# Patient Record
Sex: Male | Born: 1945 | Race: White | Hispanic: No | Marital: Single | Smoking: Former smoker
Health system: Southern US, Community
[De-identification: ages and names within clinical notes are randomized; demographics above are authoritative.]

## PROBLEM LIST (undated history)

## (undated) DIAGNOSIS — IMO0002 Reserved for concepts with insufficient information to code with codable children: Secondary | ICD-10-CM

## (undated) DIAGNOSIS — I10 Essential (primary) hypertension: Secondary | ICD-10-CM

## (undated) DIAGNOSIS — E785 Hyperlipidemia, unspecified: Secondary | ICD-10-CM

## (undated) DIAGNOSIS — R011 Cardiac murmur, unspecified: Secondary | ICD-10-CM

## (undated) DIAGNOSIS — I779 Disorder of arteries and arterioles, unspecified: Secondary | ICD-10-CM

## (undated) DIAGNOSIS — I639 Cerebral infarction, unspecified: Secondary | ICD-10-CM

## (undated) DIAGNOSIS — Z720 Tobacco use: Secondary | ICD-10-CM

## (undated) DIAGNOSIS — M1990 Unspecified osteoarthritis, unspecified site: Secondary | ICD-10-CM

## (undated) DIAGNOSIS — D369 Benign neoplasm, unspecified site: Secondary | ICD-10-CM

## (undated) DIAGNOSIS — E119 Type 2 diabetes mellitus without complications: Secondary | ICD-10-CM

## (undated) DIAGNOSIS — I739 Peripheral vascular disease, unspecified: Secondary | ICD-10-CM

## (undated) DIAGNOSIS — G629 Polyneuropathy, unspecified: Secondary | ICD-10-CM

## (undated) DIAGNOSIS — E559 Vitamin D deficiency, unspecified: Secondary | ICD-10-CM

## (undated) DIAGNOSIS — J189 Pneumonia, unspecified organism: Secondary | ICD-10-CM

## (undated) HISTORY — DX: Hyperlipidemia, unspecified: E78.5

## (undated) HISTORY — DX: Tobacco use: Z72.0

## (undated) HISTORY — DX: Vitamin D deficiency, unspecified: E55.9

## (undated) HISTORY — DX: Benign neoplasm, unspecified site: D36.9

## (undated) HISTORY — PX: OTHER SURGICAL HISTORY: SHX169

## (undated) HISTORY — DX: Disorder of arteries and arterioles, unspecified: I77.9

## (undated) HISTORY — DX: Unspecified osteoarthritis, unspecified site: M19.90

## (undated) HISTORY — PX: ARTHRODESIS: SHX136

## (undated) HISTORY — DX: Reserved for concepts with insufficient information to code with codable children: IMO0002

## (undated) HISTORY — DX: Cardiac murmur, unspecified: R01.1

## (undated) HISTORY — DX: Type 2 diabetes mellitus without complications: E11.9

## (undated) HISTORY — DX: Essential (primary) hypertension: I10

## (undated) HISTORY — DX: Polyneuropathy, unspecified: G62.9

## (undated) HISTORY — DX: Peripheral vascular disease, unspecified: I73.9

## (undated) HISTORY — DX: Cerebral infarction, unspecified: I63.9

## (undated) HISTORY — DX: Pneumonia, unspecified organism: J18.9

## (undated) HISTORY — PX: BACK SURGERY: SHX140

---

## 1999-09-27 ENCOUNTER — Encounter: Payer: Self-pay | Admitting: Neurosurgery

## 1999-09-27 ENCOUNTER — Inpatient Hospital Stay (HOSPITAL_COMMUNITY): Admission: RE | Admit: 1999-09-27 | Discharge: 1999-09-27 | Payer: Self-pay | Admitting: Neurosurgery

## 2003-03-27 ENCOUNTER — Inpatient Hospital Stay (HOSPITAL_COMMUNITY): Admission: EM | Admit: 2003-03-27 | Discharge: 2003-04-04 | Payer: Self-pay | Admitting: Emergency Medicine

## 2003-03-27 ENCOUNTER — Encounter: Payer: Self-pay | Admitting: Emergency Medicine

## 2003-03-28 ENCOUNTER — Encounter: Payer: Self-pay | Admitting: Neurosurgery

## 2003-04-04 ENCOUNTER — Inpatient Hospital Stay (HOSPITAL_COMMUNITY)
Admission: RE | Admit: 2003-04-04 | Discharge: 2003-04-18 | Payer: Self-pay | Admitting: Physical Medicine & Rehabilitation

## 2003-05-26 ENCOUNTER — Encounter
Admission: RE | Admit: 2003-05-26 | Discharge: 2003-08-24 | Payer: Self-pay | Admitting: Physical Medicine & Rehabilitation

## 2003-06-20 ENCOUNTER — Emergency Department (HOSPITAL_COMMUNITY): Admission: EM | Admit: 2003-06-20 | Discharge: 2003-06-20 | Payer: Self-pay | Admitting: Emergency Medicine

## 2003-06-20 IMAGING — CT CT HEAD W/O CM
1 series · 16 of 30 positions shown, 20 images · non-contrast
Comparison: NONE.

FINDINGS
CLINICAL DATA: DIZZINESS.
HEAD CT WITHOUT CONTRAST, [DATE]

[Series 2: head · axial · 0.47mm/px · z∈[+139,+278]mm · 16 of 32 slices shown, 20 images]
[im 2/32  brain]
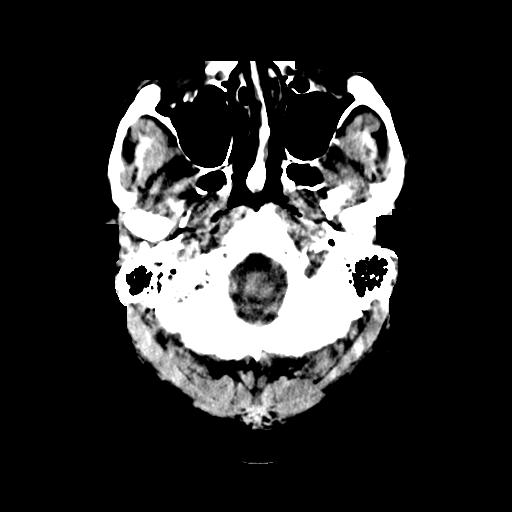
[im 2/32  bone]
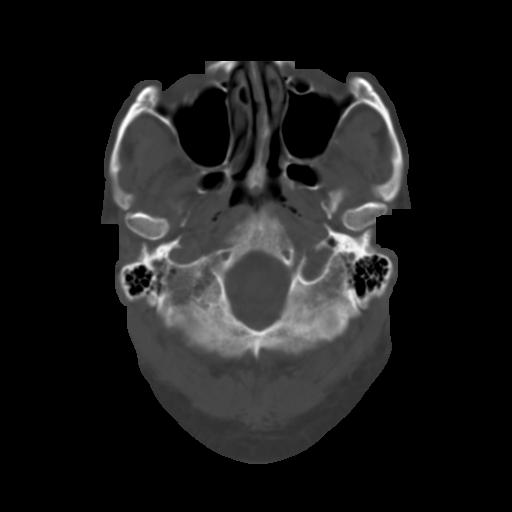
[im 4/32  brain]
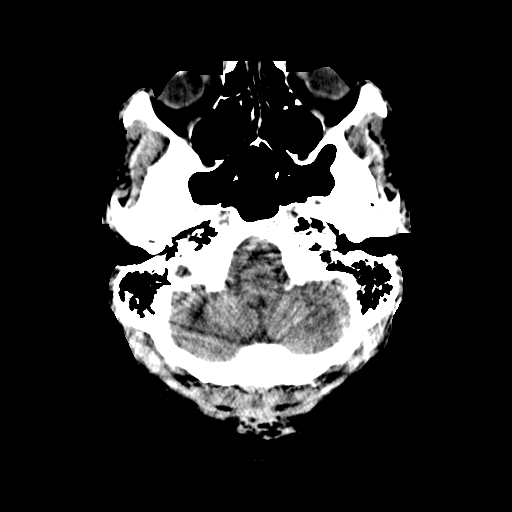
[im 6/32  brain]
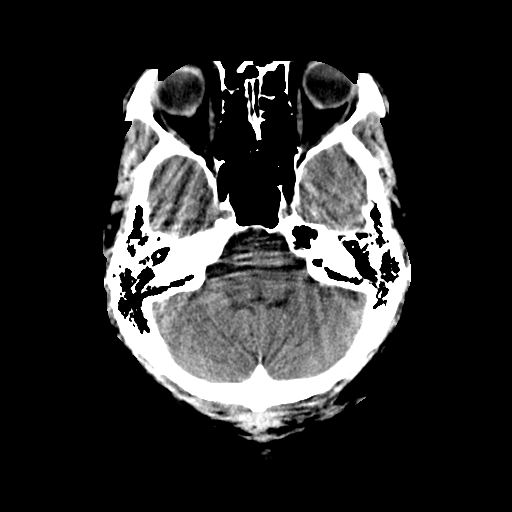
[im 8/32  brain]
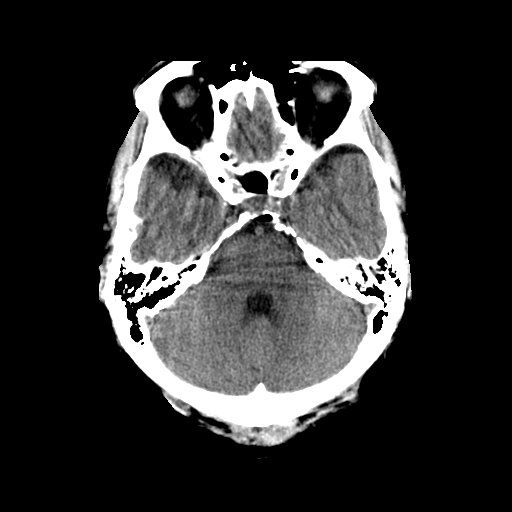
[im 9/32  brain]
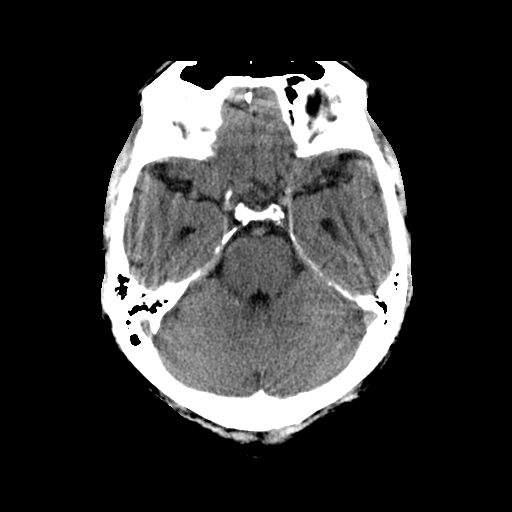
[im 9/32  bone]
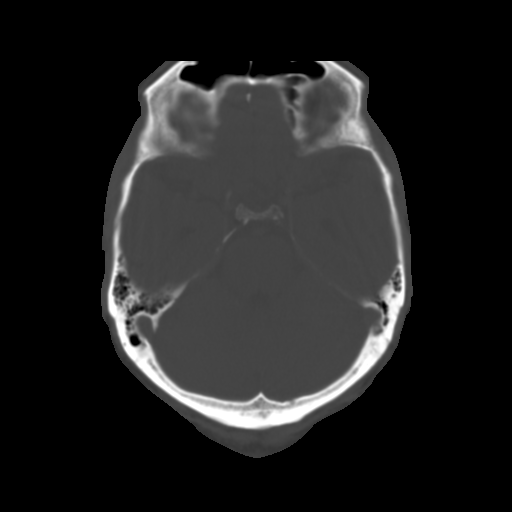
[im 11/32  brain]
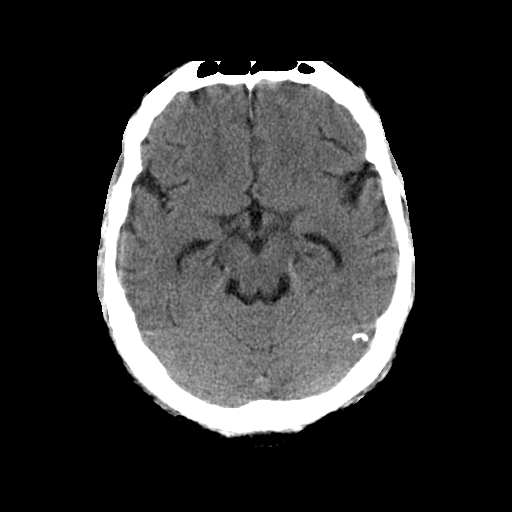
[im 13/32  brain]
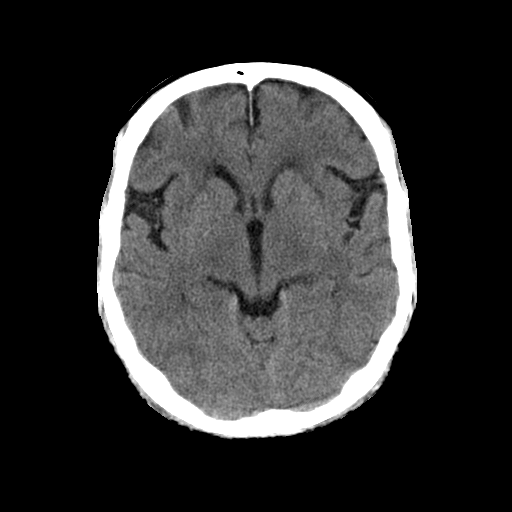
[im 15/32  brain]
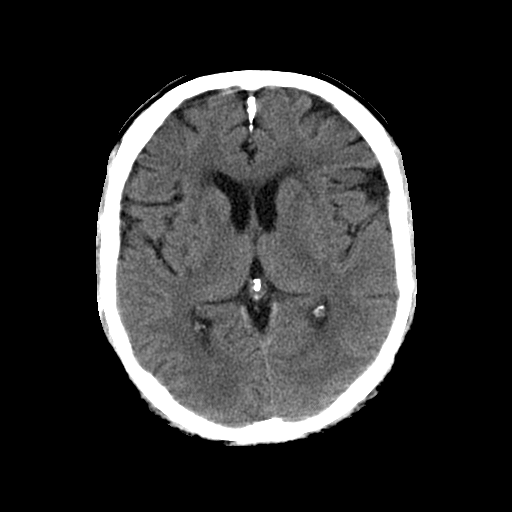
[im 17/32  brain]
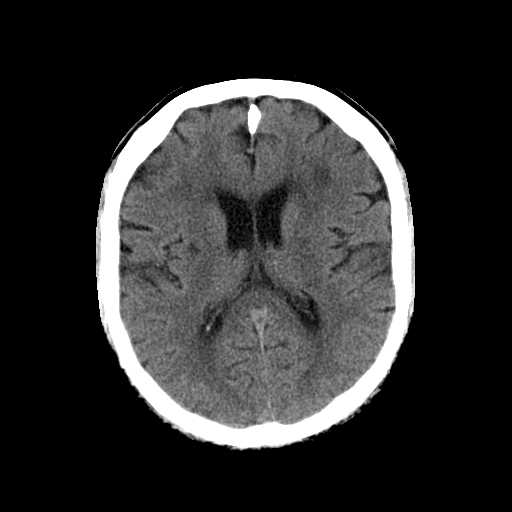
[im 17/32  bone]
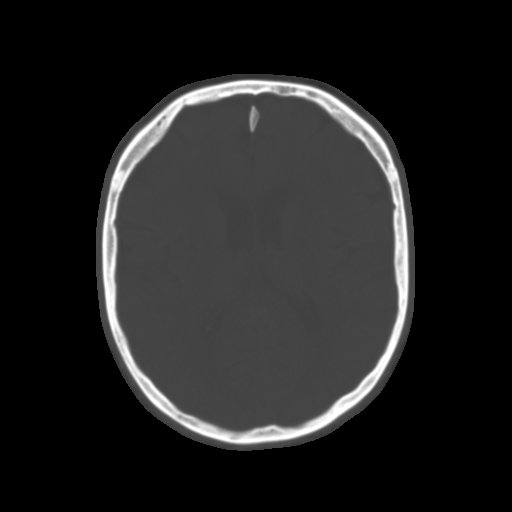
[im 19/32  brain]
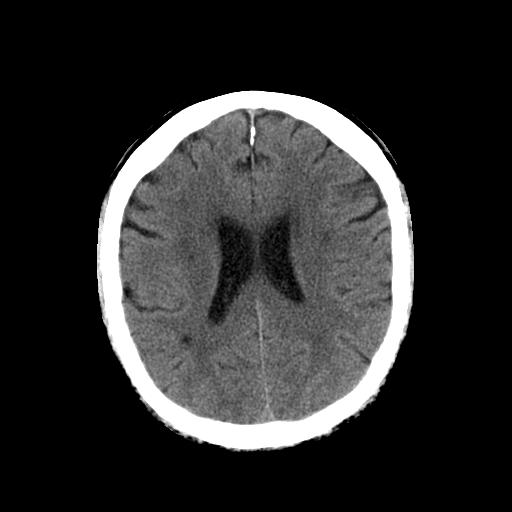
[im 21/32  brain]
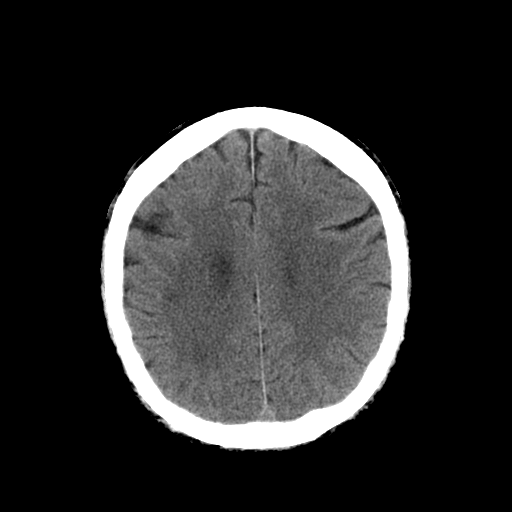
[im 23/32  brain]
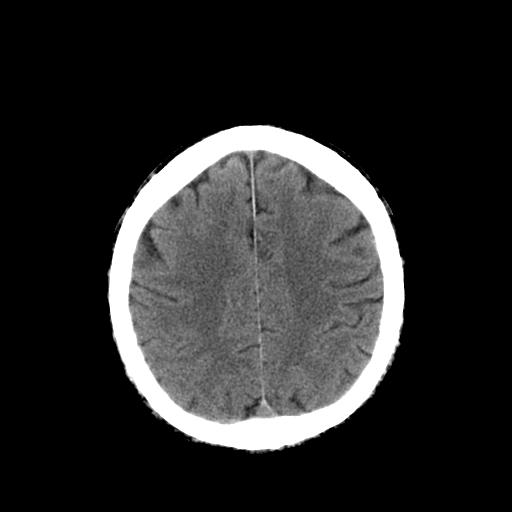
[im 24/32  brain]
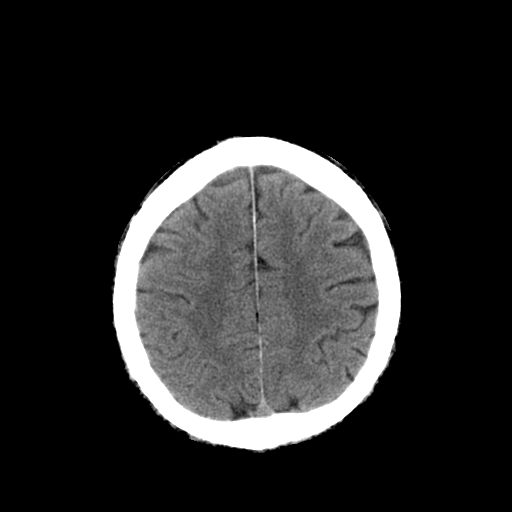
[im 24/32  bone]
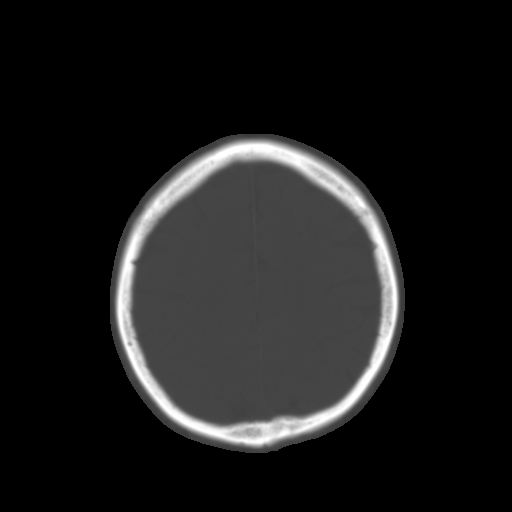
[im 26/32  brain]
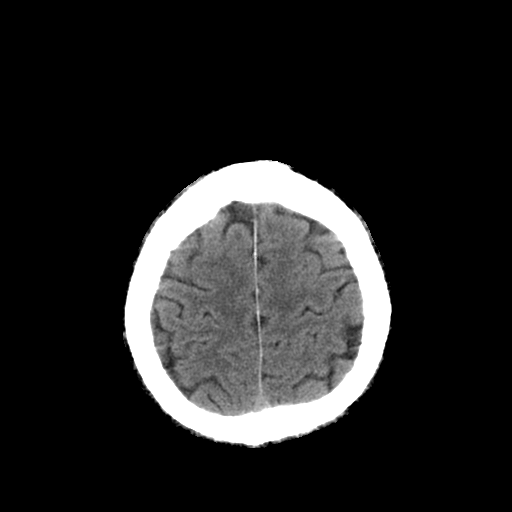
[im 28/32  brain]
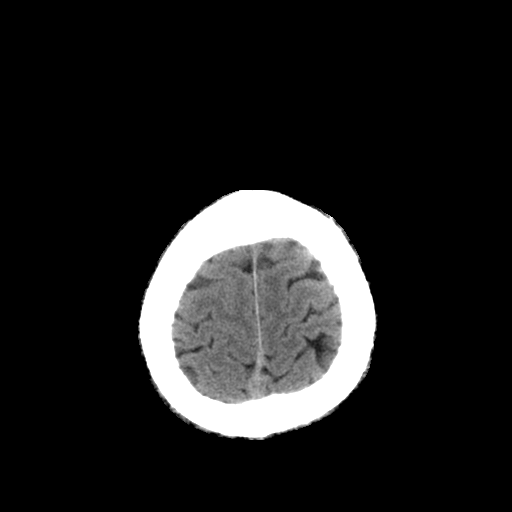
[im 30/32  brain]
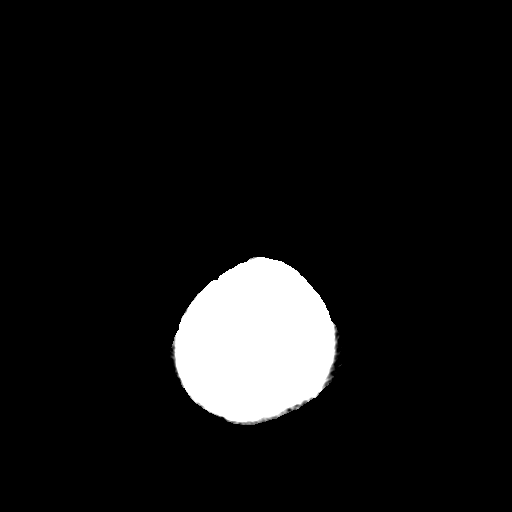

[16 of 30 positions shown; findings below may reference images not displayed]

FINDINGS: ROUTINE UNINFUSED CT SCANNING OF THE HEAD SHOWS NO EVIDENCE FOR ACUTE HEMORRHAGE,
HYDROCEPHALUS, MASS EFFECT, OR ABNORMAL EXTRA-AXIAL FLUID COLLECTION.  PATCHY AREAS OF LOW
ATTENUATION ARE SEEN SCATTERED THROUGHOUT THE PERIVENTRICULAR DEEP WHITE MATTER.  NO DEFINITE CT
EVIDENCE FOR ACUTE ISCHEMIA.
BONE WINDOWS SHOW THE VISUALIZED PORTIONS OF THE PARANASAL SINUSES TO BE CLEAR.
IMPRESSION
1. NO CT EVIDENCE OF ACUTE INTRACRANIAL ABNORMALITY.
2.  PATCHY AREAS OF LOW ATTENUATION ARE SEEN IN THE DEEP PERIVENTRICULAR AND SUBCORTICAL WHITE
MATTER.  ALTHOUGH NONSPECIFIC THE CHANGES ARE OFTEN RELATED TO CHRONIC SMALL VESSEL DISEASE.
FEATURES ON TODAY'S STUDY ARE SLIGHTLY MORE PATCHY THAN TYPICAL AND MRI MIGHT PROVE HELPFUL TO
FURTHER CHARACTERIZE.

## 2003-06-27 ENCOUNTER — Encounter: Admission: RE | Admit: 2003-06-27 | Discharge: 2003-06-27 | Payer: Self-pay | Admitting: Neurosurgery

## 2003-09-18 ENCOUNTER — Encounter
Admission: RE | Admit: 2003-09-18 | Discharge: 2003-12-17 | Payer: Self-pay | Admitting: Physical Medicine & Rehabilitation

## 2003-09-20 ENCOUNTER — Encounter: Admission: RE | Admit: 2003-09-20 | Discharge: 2003-09-20 | Payer: Self-pay | Admitting: Neurosurgery

## 2003-10-31 ENCOUNTER — Ambulatory Visit (HOSPITAL_COMMUNITY)
Admission: RE | Admit: 2003-10-31 | Discharge: 2003-10-31 | Payer: Self-pay | Admitting: Physical Medicine & Rehabilitation
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
## 2004-02-06 ENCOUNTER — Encounter
Admission: RE | Admit: 2004-02-06 | Discharge: 2004-05-06 | Payer: Self-pay | Admitting: Physical Medicine & Rehabilitation

## 2004-05-29 ENCOUNTER — Encounter
Admission: RE | Admit: 2004-05-29 | Discharge: 2004-08-27 | Payer: Self-pay | Admitting: Physical Medicine & Rehabilitation

## 2004-05-31 ENCOUNTER — Ambulatory Visit: Payer: Self-pay | Admitting: Physical Medicine & Rehabilitation

## 2004-08-28 ENCOUNTER — Encounter
Admission: RE | Admit: 2004-08-28 | Discharge: 2004-11-26 | Payer: Self-pay | Admitting: Physical Medicine & Rehabilitation

## 2004-11-29 ENCOUNTER — Encounter
Admission: RE | Admit: 2004-11-29 | Discharge: 2005-02-27 | Payer: Self-pay | Admitting: Physical Medicine & Rehabilitation

## 2004-12-03 ENCOUNTER — Ambulatory Visit: Payer: Self-pay | Admitting: Physical Medicine & Rehabilitation

## 2005-01-14 ENCOUNTER — Ambulatory Visit: Payer: Self-pay | Admitting: Physical Medicine & Rehabilitation

## 2005-03-07 ENCOUNTER — Encounter
Admission: RE | Admit: 2005-03-07 | Discharge: 2005-06-05 | Payer: Self-pay | Admitting: Physical Medicine & Rehabilitation

## 2005-03-07 ENCOUNTER — Ambulatory Visit: Payer: Self-pay | Admitting: Physical Medicine & Rehabilitation

## 2005-06-09 ENCOUNTER — Encounter
Admission: RE | Admit: 2005-06-09 | Discharge: 2005-09-07 | Payer: Self-pay | Admitting: Physical Medicine & Rehabilitation

## 2005-06-09 ENCOUNTER — Ambulatory Visit: Payer: Self-pay | Admitting: Physical Medicine & Rehabilitation

## 2005-08-29 ENCOUNTER — Ambulatory Visit: Payer: Self-pay | Admitting: Physical Medicine & Rehabilitation

## 2006-02-04 ENCOUNTER — Ambulatory Visit: Payer: Self-pay | Admitting: Family Medicine

## 2006-03-01 ENCOUNTER — Ambulatory Visit: Payer: Self-pay | Admitting: Family Medicine

## 2006-04-01 ENCOUNTER — Ambulatory Visit: Payer: Self-pay | Admitting: Family Medicine

## 2006-06-18 ENCOUNTER — Ambulatory Visit: Payer: Self-pay | Admitting: Gastroenterology

## 2009-07-26 ENCOUNTER — Emergency Department: Payer: Self-pay | Admitting: Emergency Medicine
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
## 2011-09-15 ENCOUNTER — Ambulatory Visit: Payer: Self-pay | Admitting: Gastroenterology

## 2011-09-17 LAB — PATHOLOGY REPORT

## 2012-04-06 LAB — COMPREHENSIVE METABOLIC PANEL
BUN: 11 mg/dL (ref 7–18)
Bilirubin,Total: 0.5 mg/dL (ref 0.2–1.0)
Chloride: 108 mmol/L — ABNORMAL HIGH (ref 98–107)
Creatinine: 0.87 mg/dL (ref 0.60–1.30)
EGFR (African American): >60
Potassium: 3.7 mmol/L (ref 3.5–5.1)
Sodium: 143 mmol/L (ref 136–145)
Total Protein: 6.7 g/dL (ref 6.4–8.2)
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
## 2012-04-06 IMAGING — CT CT LUMBAR SPINE WITHOUT CONTRAST
1 series · 12 of 14 positions shown, 15 images · non-contrast
Comparison: none

REASON FOR EXAM: evaluate for acute lumbar fracture
COMMENTS:

[Series 2: axials · axial · 0.40mm/px · z∈[-461,-263]mm · 12 of 80 slices shown, 15 images]
[im 7/80  soft-tissue]
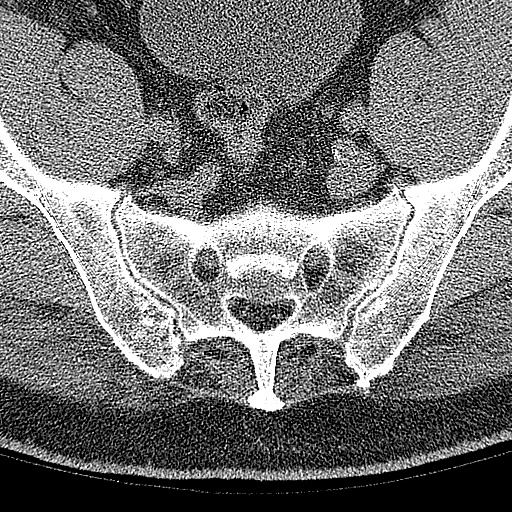
[im 7/80  bone]
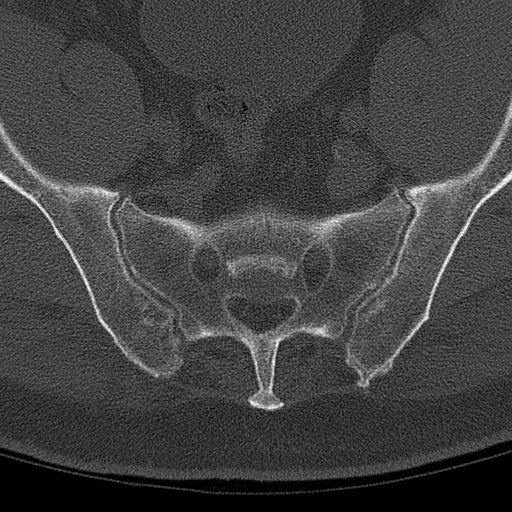
[im 13/80  bone]
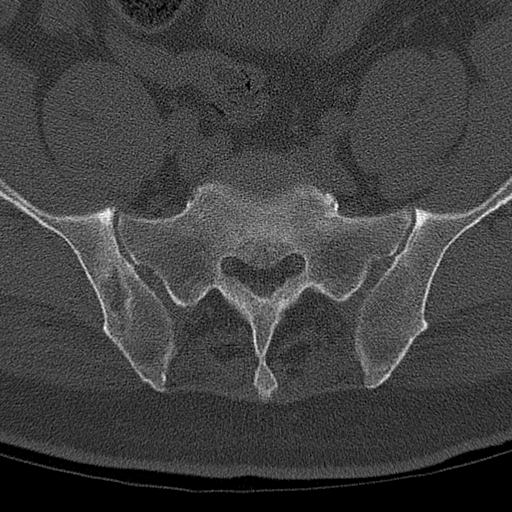
[im 19/80  bone]
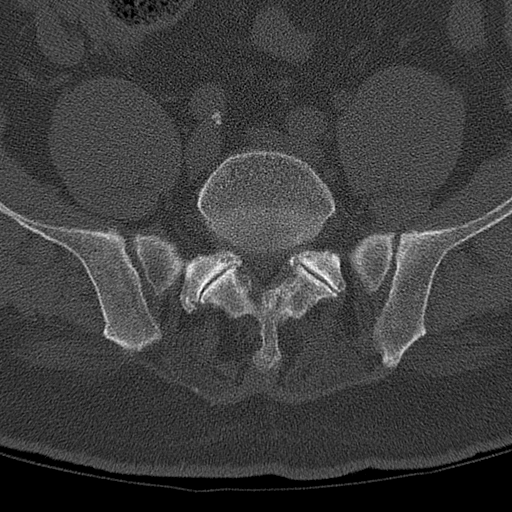
[im 25/80  bone]
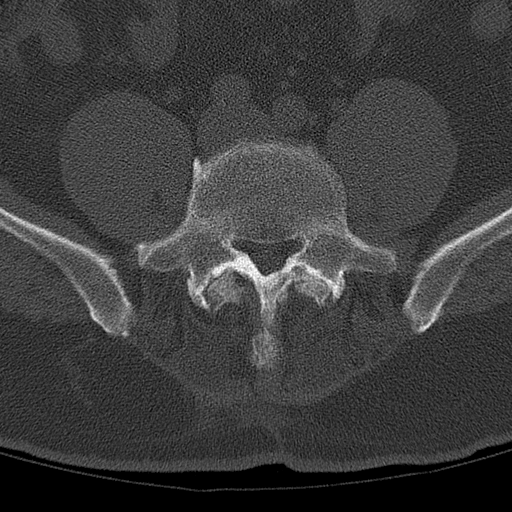
[im 31/80  soft-tissue]
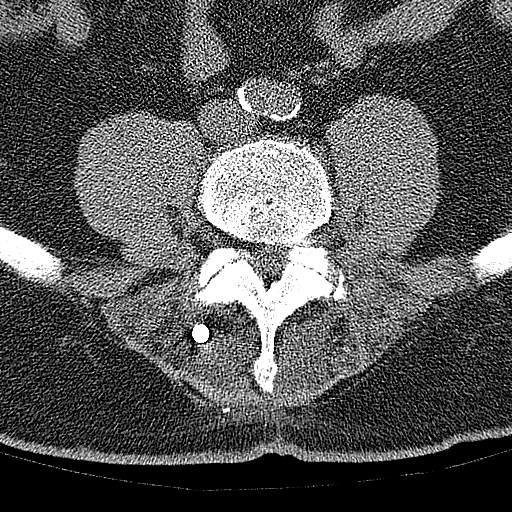
[im 31/80  bone]
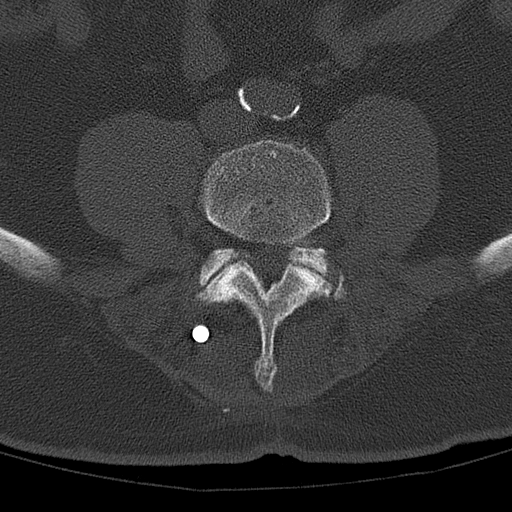
[im 37/80  bone]
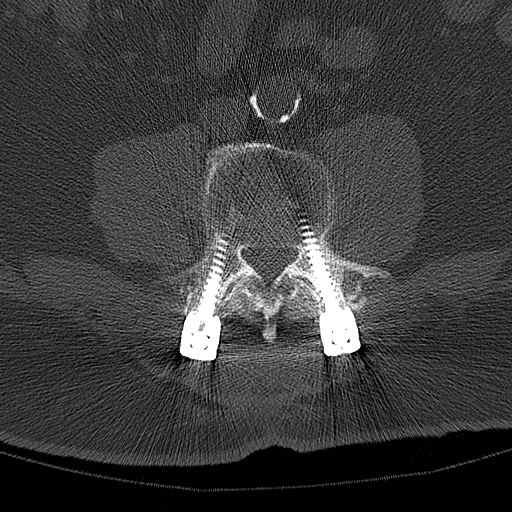
[im 43/80  bone]
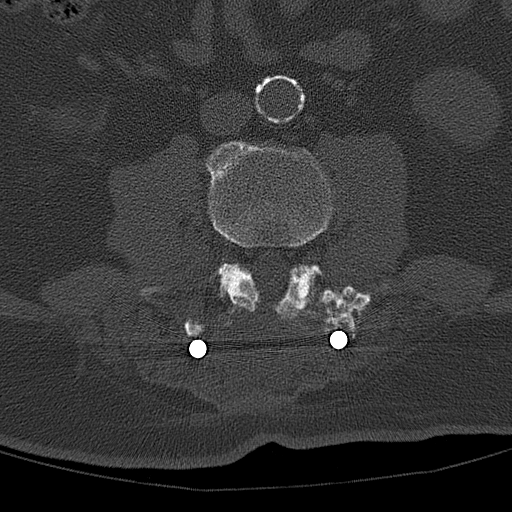
[im 49/80  bone]
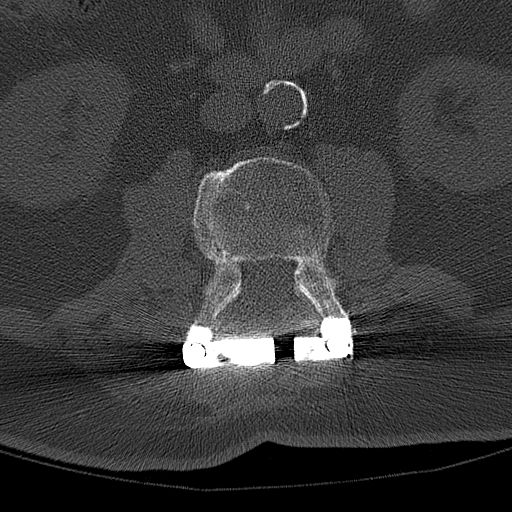
[im 55/80  soft-tissue]
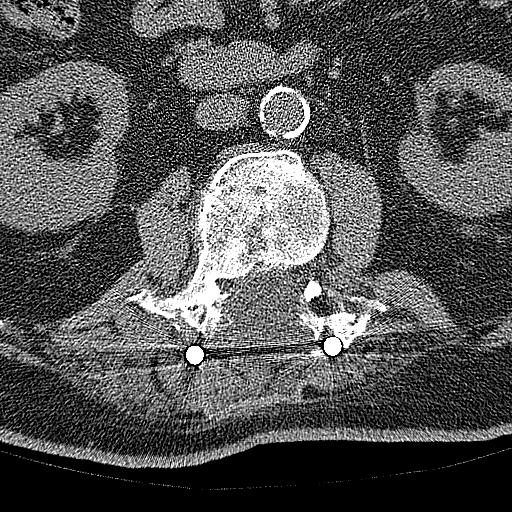
[im 55/80  bone]
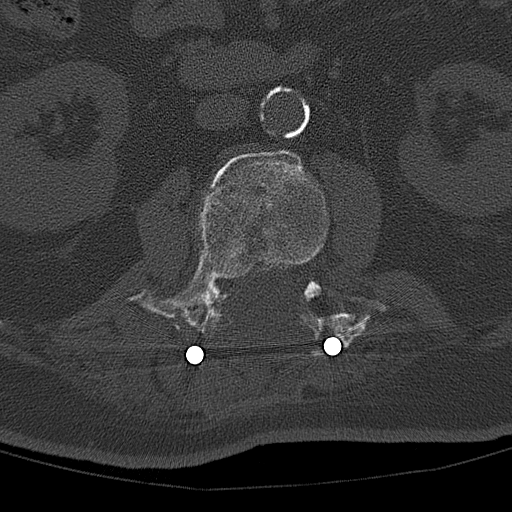
[im 61/80  bone]
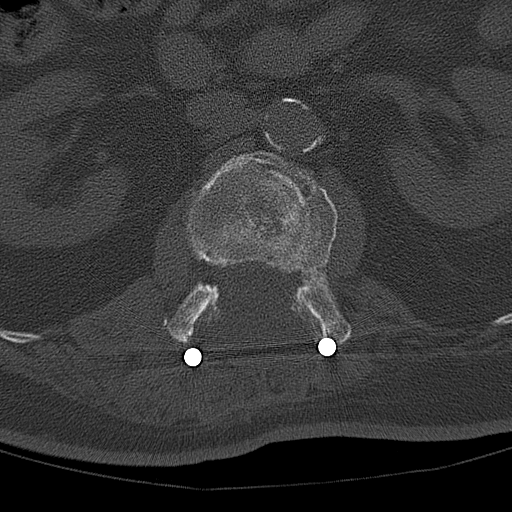
[im 67/80  bone]
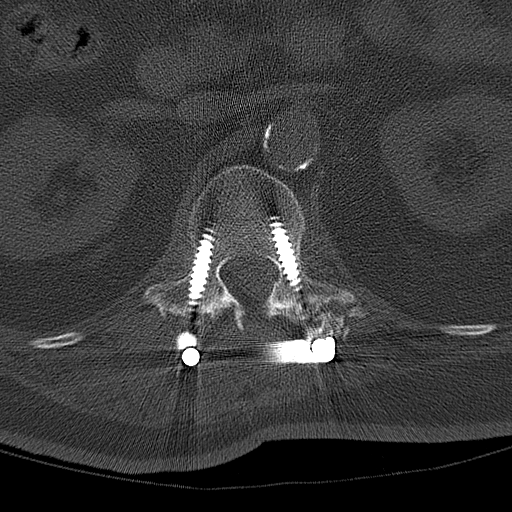
[im 73/80  bone]
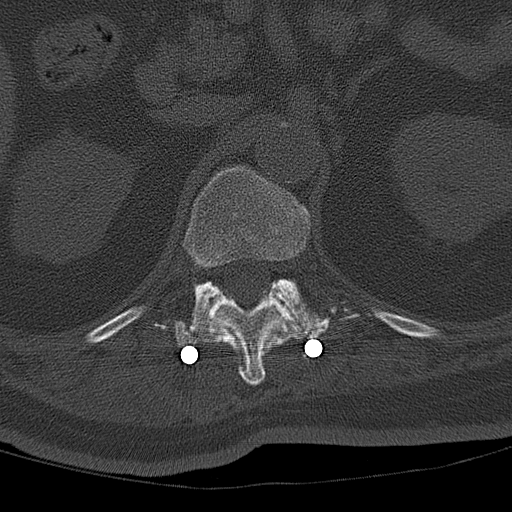

[12 of 14 positions shown; findings below may reference images not displayed]

PROCEDURE:     CT  - CT LUMBAR SPINE WO  - [DATE]  [DATE]

RESULT:     Multislice helical acquisition through the lumbar spine is
reconstructed at bone window settings in the axial, coronal and sagittal
planes at 3.0 mm slice thickness. Pedicle screws are seen bilaterally at L4,
L3, L2 and L1 with stabilization rods bilaterally. There is loss of height
and evidence of acute fracture involving the L2 vertebral body with some
posterior displacement. There is some prominent spurring between the
anterior aspect of L1 and L2. There are no previous radiographs or CT is a
lumbar spine for comparison. There has been previous laminectomy at this
level and therefore likely compromise the spinal canal is not evident.
IMPRESSION: Compression fracture of L2 possibly with some acute on
chronic change. Correlate with previous outside images if they exist.
Postsurgical changes present as described. No other levels of abnormality
are evident. Laminectomy changes are noted as discussed above. This raises
the possibility that some of the changes visualized could be chronic.

[REDACTED]
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
## 2012-04-07 ENCOUNTER — Observation Stay: Payer: Self-pay | Admitting: Family Medicine

## 2012-04-07 LAB — TROPONIN I
Troponin-I: 0.02 ng/mL
Troponin-I: <0.02 ng/mL

## 2012-04-07 LAB — CBC WITH DIFFERENTIAL/PLATELET
Basophil #: 0.1 10*3/uL (ref 0.0–0.1)
Eosinophil #: 0.1 10*3/uL (ref 0.0–0.7)
Lymphocyte #: 2.5 10*3/uL (ref 1.0–3.6)
MCH: 29 pg (ref 26.0–34.0)
MCHC: 34.4 g/dL (ref 32.0–36.0)
MCV: 84 fL (ref 80–100)
Monocyte #: 0.5 x10 3/mm (ref 0.2–1.0)
Monocyte %: 6.1 %
Platelet: 188 10*3/uL (ref 150–440)
RBC: 5.25 10*6/uL (ref 4.40–5.90)
RDW: 13.4 % (ref 11.5–14.5)
WBC: 8.9 10*3/uL (ref 3.8–10.6)

## 2012-04-08 LAB — BASIC METABOLIC PANEL
Anion Gap: 9 (ref 7–16)
BUN: 15 mg/dL (ref 7–18)
EGFR (African American): >60
Glucose: 237 mg/dL — ABNORMAL HIGH (ref 65–99)
Osmolality: 282 (ref 275–301)

## 2012-06-01 DIAGNOSIS — I639 Cerebral infarction, unspecified: Secondary | ICD-10-CM

## 2012-06-01 HISTORY — DX: Cerebral infarction, unspecified: I63.9

## 2012-06-28 ENCOUNTER — Emergency Department: Payer: Self-pay | Admitting: Emergency Medicine

## 2012-06-28 LAB — COMPREHENSIVE METABOLIC PANEL
Albumin: 3.5 g/dL (ref 3.4–5.0)
Anion Gap: 10 (ref 7–16)
BUN: 10 mg/dL (ref 7–18)
Co2: 26 mmol/L (ref 21–32)
Creatinine: 1.01 mg/dL (ref 0.60–1.30)
EGFR (African American): >60
Osmolality: 291 (ref 275–301)
SGPT (ALT): 19 U/L (ref 12–78)
Sodium: 144 mmol/L (ref 136–145)

## 2012-06-28 LAB — CBC
HCT: 41.4 % (ref 40.0–52.0)
HGB: 14.7 g/dL (ref 13.0–18.0)
MCH: 29.9 pg (ref 26.0–34.0)
MCHC: 35.6 g/dL (ref 32.0–36.0)
RDW: 14.1 % (ref 11.5–14.5)

## 2012-06-28 LAB — URINALYSIS, COMPLETE
Bilirubin,UR: NEGATIVE
Blood: NEGATIVE
Ketone: NEGATIVE
Ph: 6 (ref 4.5–8.0)
RBC,UR: NONE SEEN /HPF (ref 0–5)
Squamous Epithelial: NONE SEEN
WBC UR: <1 /HPF (ref 0–5)

## 2012-06-28 IMAGING — CT CT HEAD WITHOUT CONTRAST
2 series · 16 of 30 positions shown, 20 images · non-contrast
Comparison: none

REASON FOR EXAM: slurred speech/unsteady gait
COMMENTS:

PROCEDURE:     CT  - CT HEAD WITHOUT CONTRAST  - [DATE] [DATE]
RESULT:     CT dated [DATE].
TECHNIQUE: Helical noncontrasted 5 mm sections were obtained from the skull
base to the vertex.

[Series 2: without · axial · non-contrast · 0.44mm/px · z∈[-122,+13]mm · 13 of 33 slices shown, 17 images]
[im 3/33  brain]
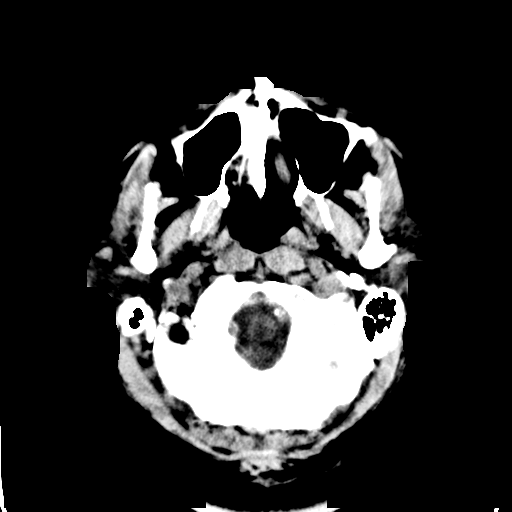
[im 3/33  bone]
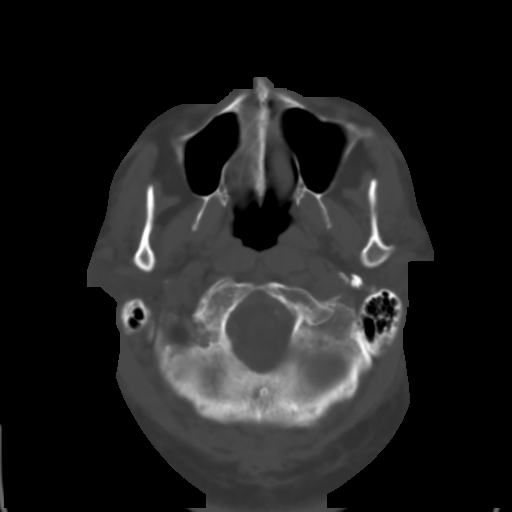
[im 5/33  brain]
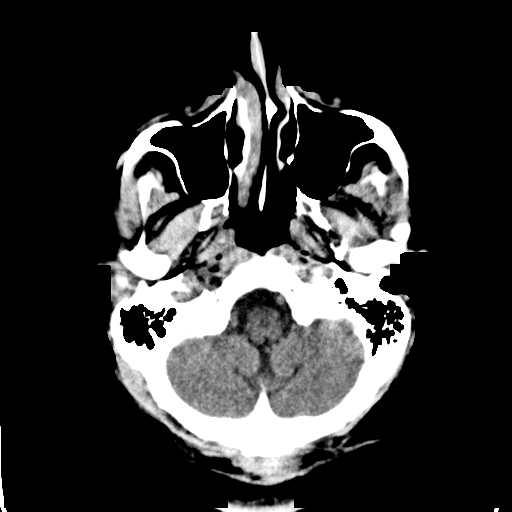
[im 7/33  brain]
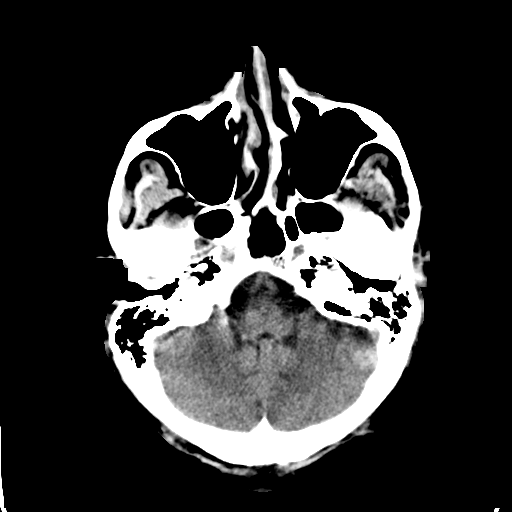
[im 10/33  brain]
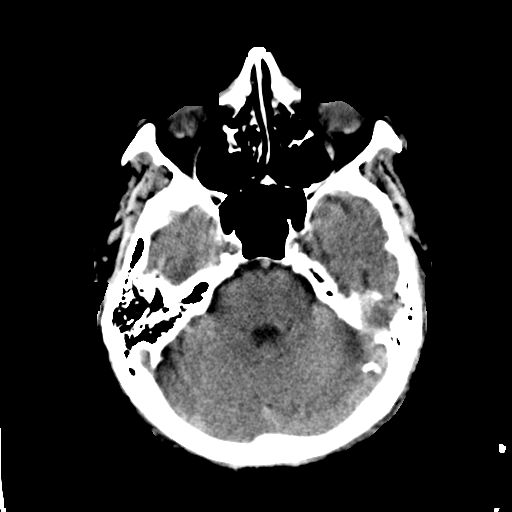
[im 12/33  brain]
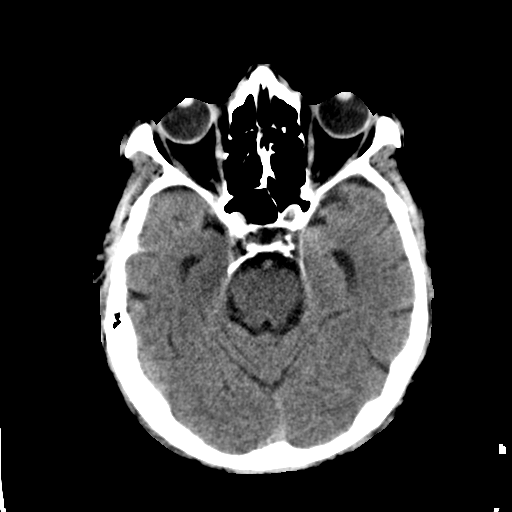
[im 12/33  bone]
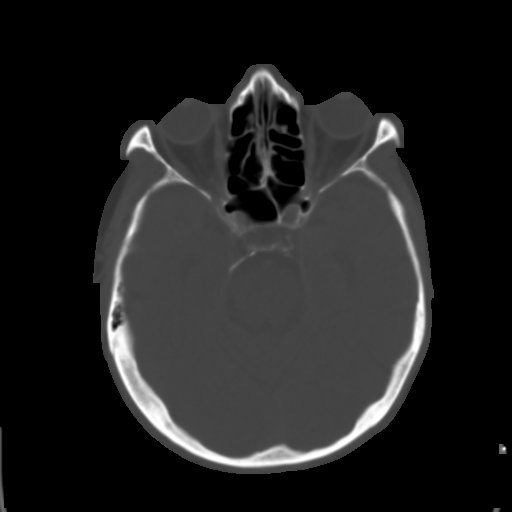
[im 14/33  brain]
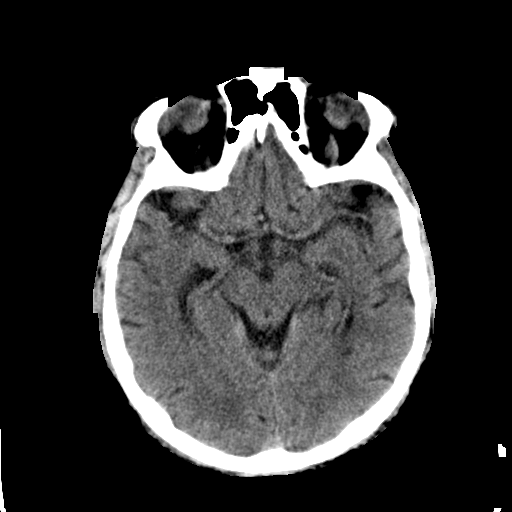
[im 17/33  brain]
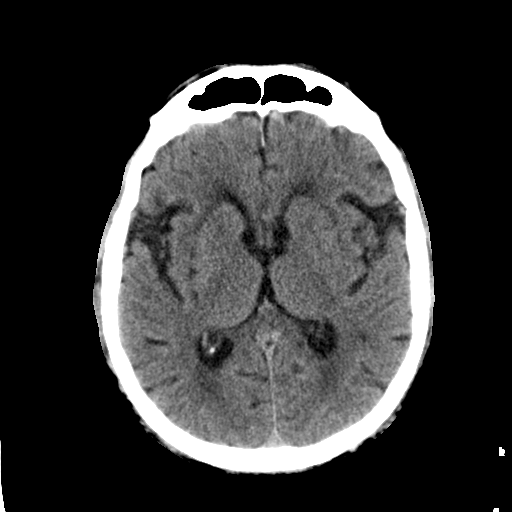
[im 19/33  brain]
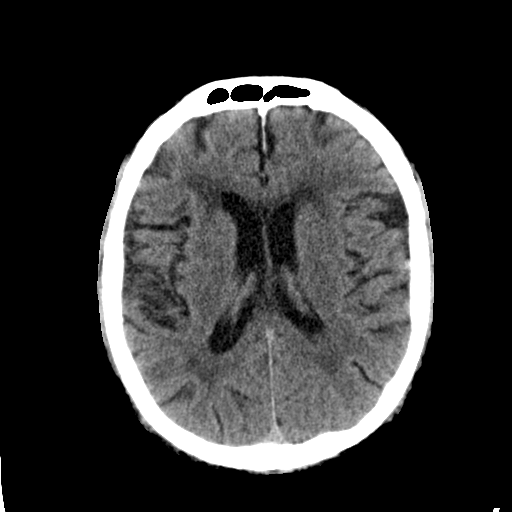
[im 21/33  brain]
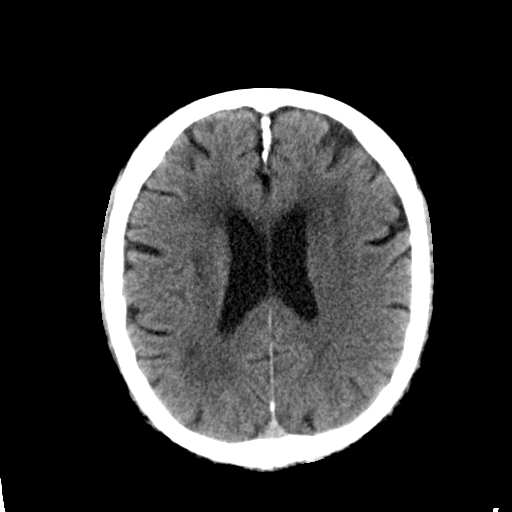
[im 21/33  bone]
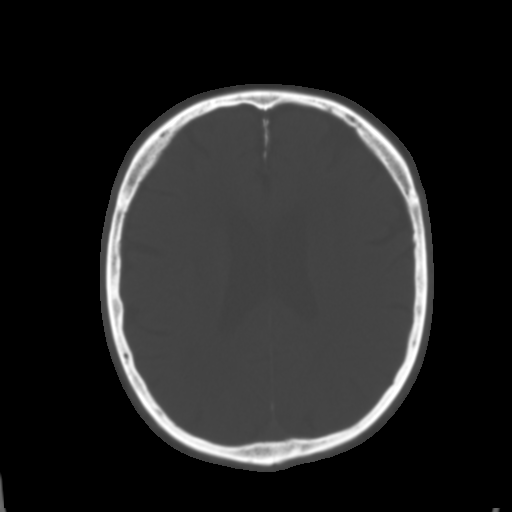
[im 23/33  brain]
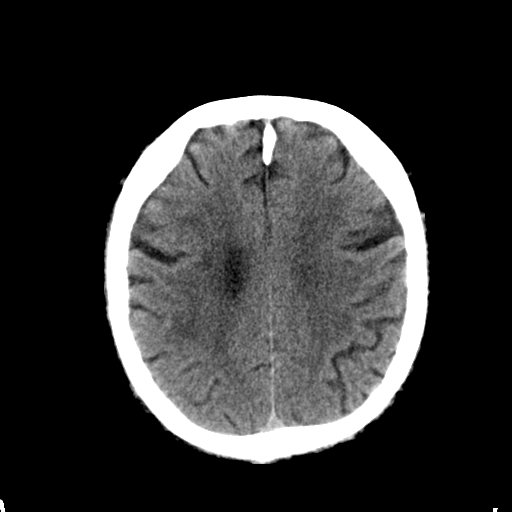
[im 26/33  brain]
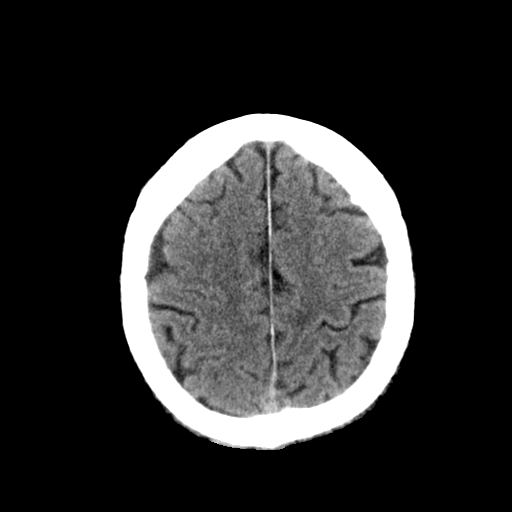
[im 28/33  brain]
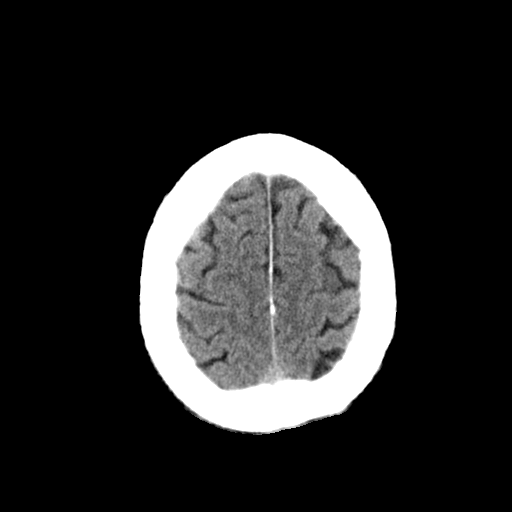
[im 30/33  brain]
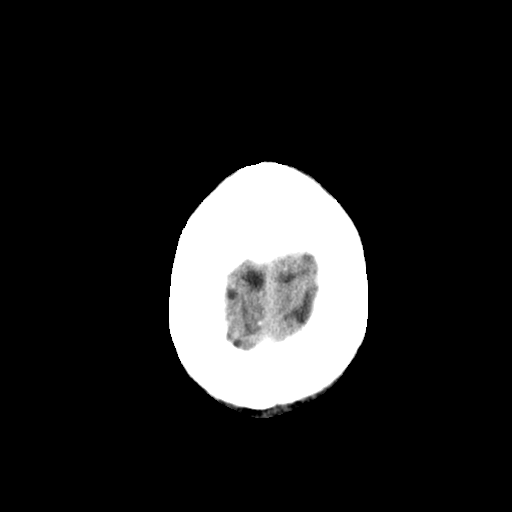
[im 30/33  bone]
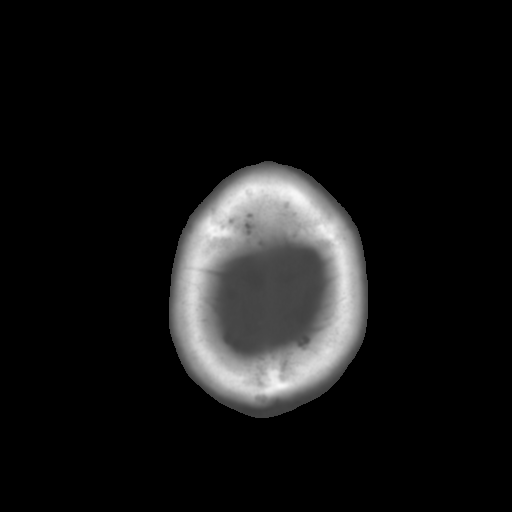

[Series 3: bone · axial · 0.44mm/px · z∈[-122,-77]mm · 3 of 33 slices shown]
[im 3/33  bone]
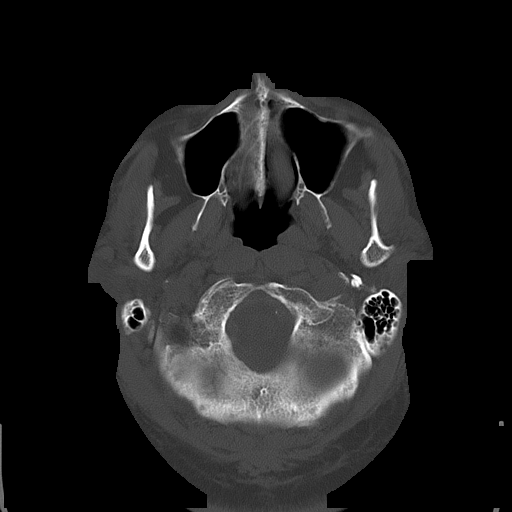
[im 7/33  bone]
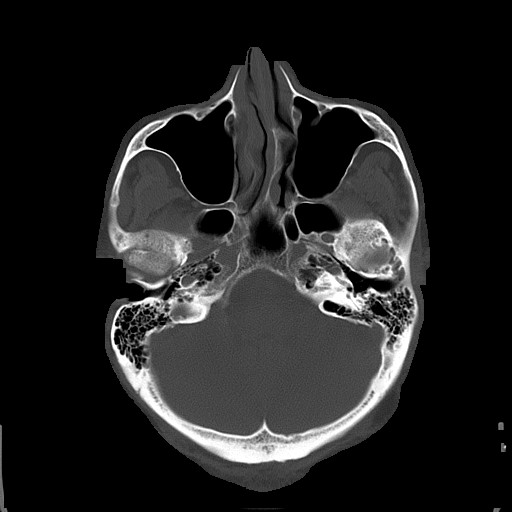
[im 12/33  bone]
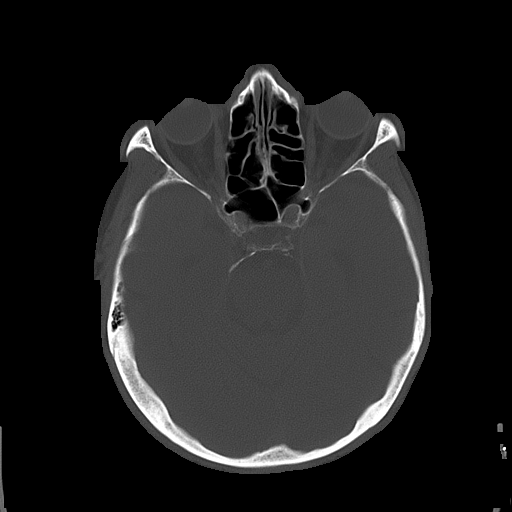

[16 of 30 positions shown; findings below may reference images not displayed]

FINDINGS: There is no evidence of intra-axial nor extra-axial fluid
collections nor evidence of acute hemorrhage. There are diffuse areas of
low-attenuation within the subcortical, deep, and periventricular white
matter regions. Is no evidence of mass effect. Is no evidence of depressed
skull fracture the visualized paranasal sinuses and mastoid air cells are
patent.
IMPRESSION: Small vessel white matter ischemic changes without evidence
of focal or acute abnormalities.

## 2012-06-29 ENCOUNTER — Inpatient Hospital Stay: Payer: Self-pay | Admitting: Family Medicine

## 2012-06-29 LAB — BASIC METABOLIC PANEL
Calcium, Total: 8.8 mg/dL (ref 8.5–10.1)
EGFR (African American): >60
EGFR (Non-African Amer.): >60
Glucose: 211 mg/dL — ABNORMAL HIGH (ref 65–99)
Osmolality: 289 (ref 275–301)

## 2012-06-29 LAB — CBC
HCT: 43.5 % (ref 40.0–52.0)
HGB: 15.4 g/dL (ref 13.0–18.0)
MCH: 29.6 pg (ref 26.0–34.0)
MCHC: 35.4 g/dL (ref 32.0–36.0)

## 2012-06-29 LAB — URINALYSIS, COMPLETE
Bacteria: NONE SEEN
Blood: NEGATIVE
Glucose,UR: 50 mg/dL (ref 0–75)
Leukocyte Esterase: NEGATIVE
Nitrite: NEGATIVE
Protein: NEGATIVE
RBC,UR: <1 /HPF (ref 0–5)
Squamous Epithelial: NONE SEEN
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
## 2012-06-30 DIAGNOSIS — I6789 Other cerebrovascular disease: Secondary | ICD-10-CM

## 2012-06-30 LAB — CBC WITH DIFFERENTIAL/PLATELET
Basophil %: 0.5 %
Eosinophil #: 0.3 10*3/uL (ref 0.0–0.7)
HGB: 14.8 g/dL (ref 13.0–18.0)
Lymphocyte #: 2.9 10*3/uL (ref 1.0–3.6)
MCH: 29.5 pg (ref 26.0–34.0)
MCHC: 35.6 g/dL (ref 32.0–36.0)
Monocyte #: 0.5 x10 3/mm (ref 0.2–1.0)
Neutrophil %: 54.6 %
Platelet: 166 10*3/uL (ref 150–440)
RDW: 13.9 % (ref 11.5–14.5)

## 2012-06-30 LAB — BASIC METABOLIC PANEL
Anion Gap: 10 (ref 7–16)
Calcium, Total: 8.9 mg/dL (ref 8.5–10.1)
Co2: 28 mmol/L (ref 21–32)
Creatinine: 0.91 mg/dL (ref 0.60–1.30)
EGFR (African American): >60
EGFR (Non-African Amer.): >60

## 2012-06-30 LAB — HEMOGLOBIN A1C: Hemoglobin A1C: 7.5 % — ABNORMAL HIGH (ref 4.2–6.3)

## 2012-06-30 LAB — MAGNESIUM: Magnesium: 2 mg/dL

## 2012-06-30 LAB — LIPID PANEL
Cholesterol: 212 mg/dL — ABNORMAL HIGH (ref 0–200)
Ldl Cholesterol, Calc: 110 mg/dL — ABNORMAL HIGH (ref 0–100)
Triglycerides: 359 mg/dL — ABNORMAL HIGH (ref 0–200)
VLDL Cholesterol, Calc: 72 mg/dL — ABNORMAL HIGH (ref 5–40)

## 2012-07-06 ENCOUNTER — Ambulatory Visit: Payer: Self-pay | Admitting: Family Medicine

## 2012-07-06 IMAGING — CR DG CHEST 2V
1 series · 3 of 3 positions shown · non-contrast
Comparison: none

REASON FOR EXAM: pneumonia
COMMENTS:

[Series 1: pa · 0.17mm/px · 3 of 3 slices shown]
[im 1/3]
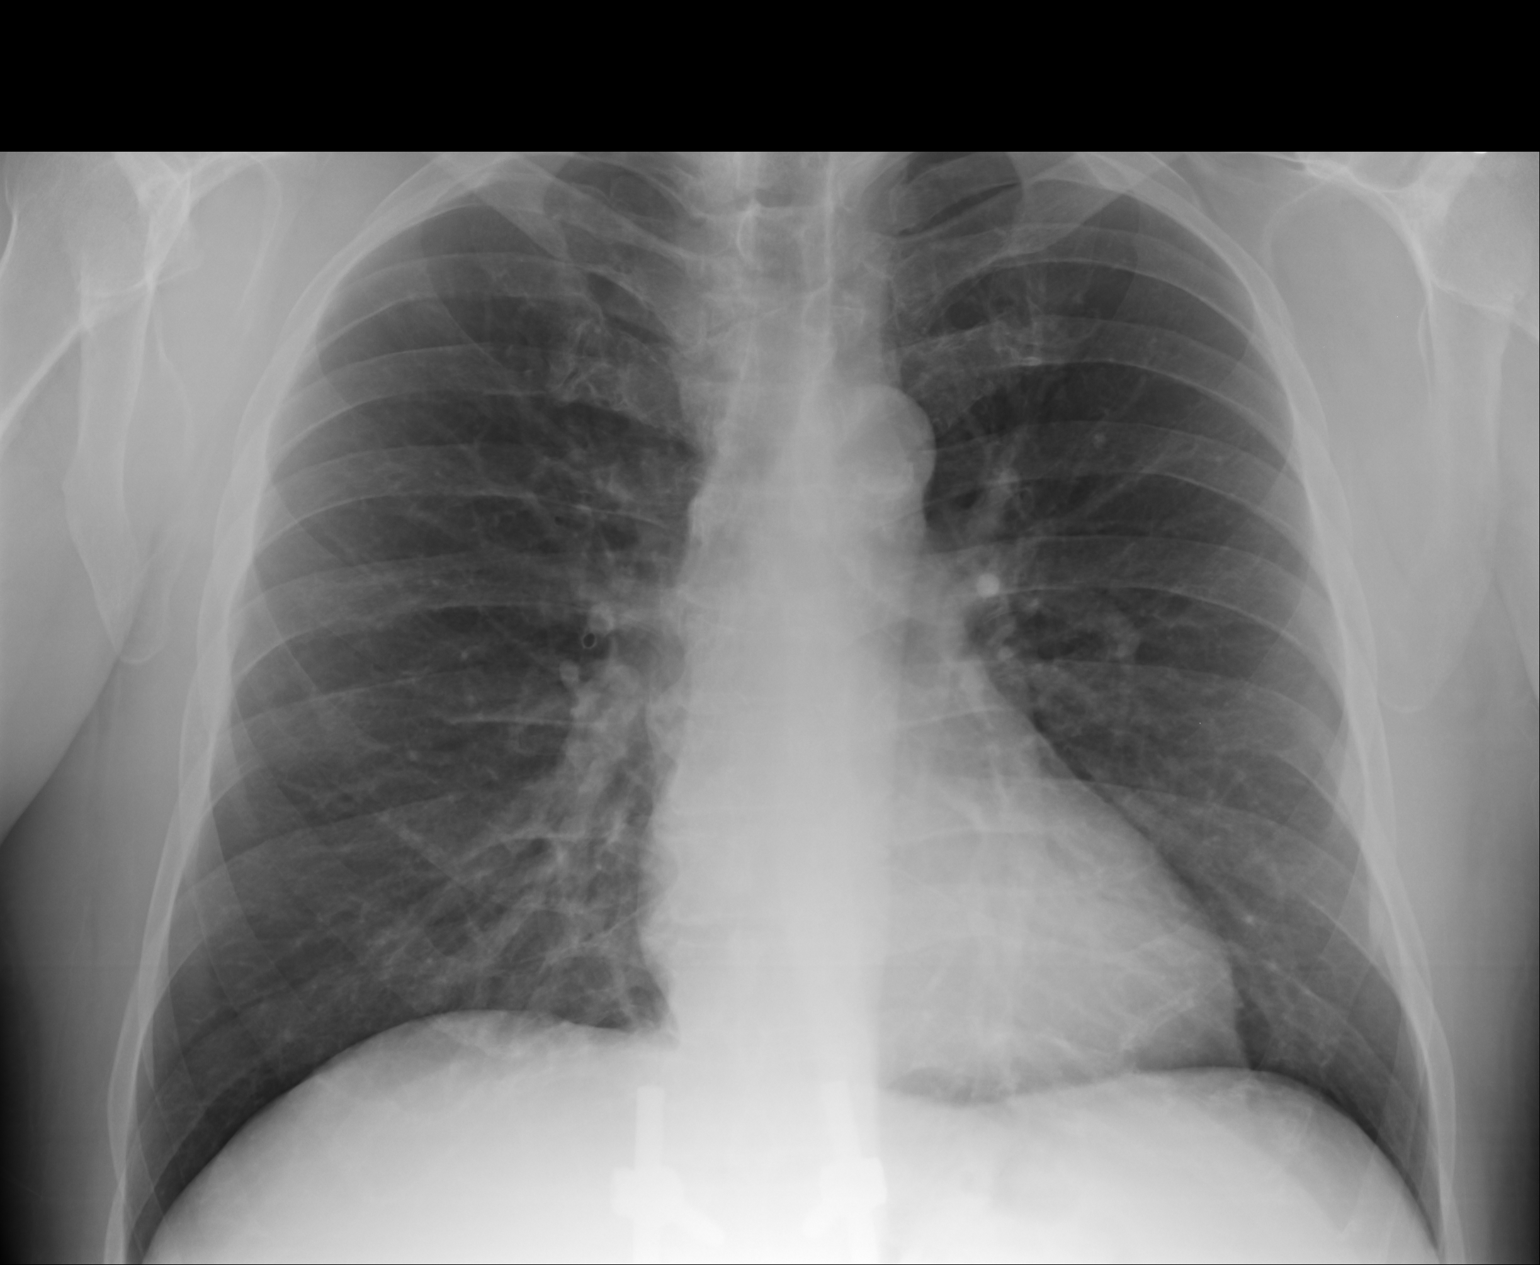
[im 2/3]
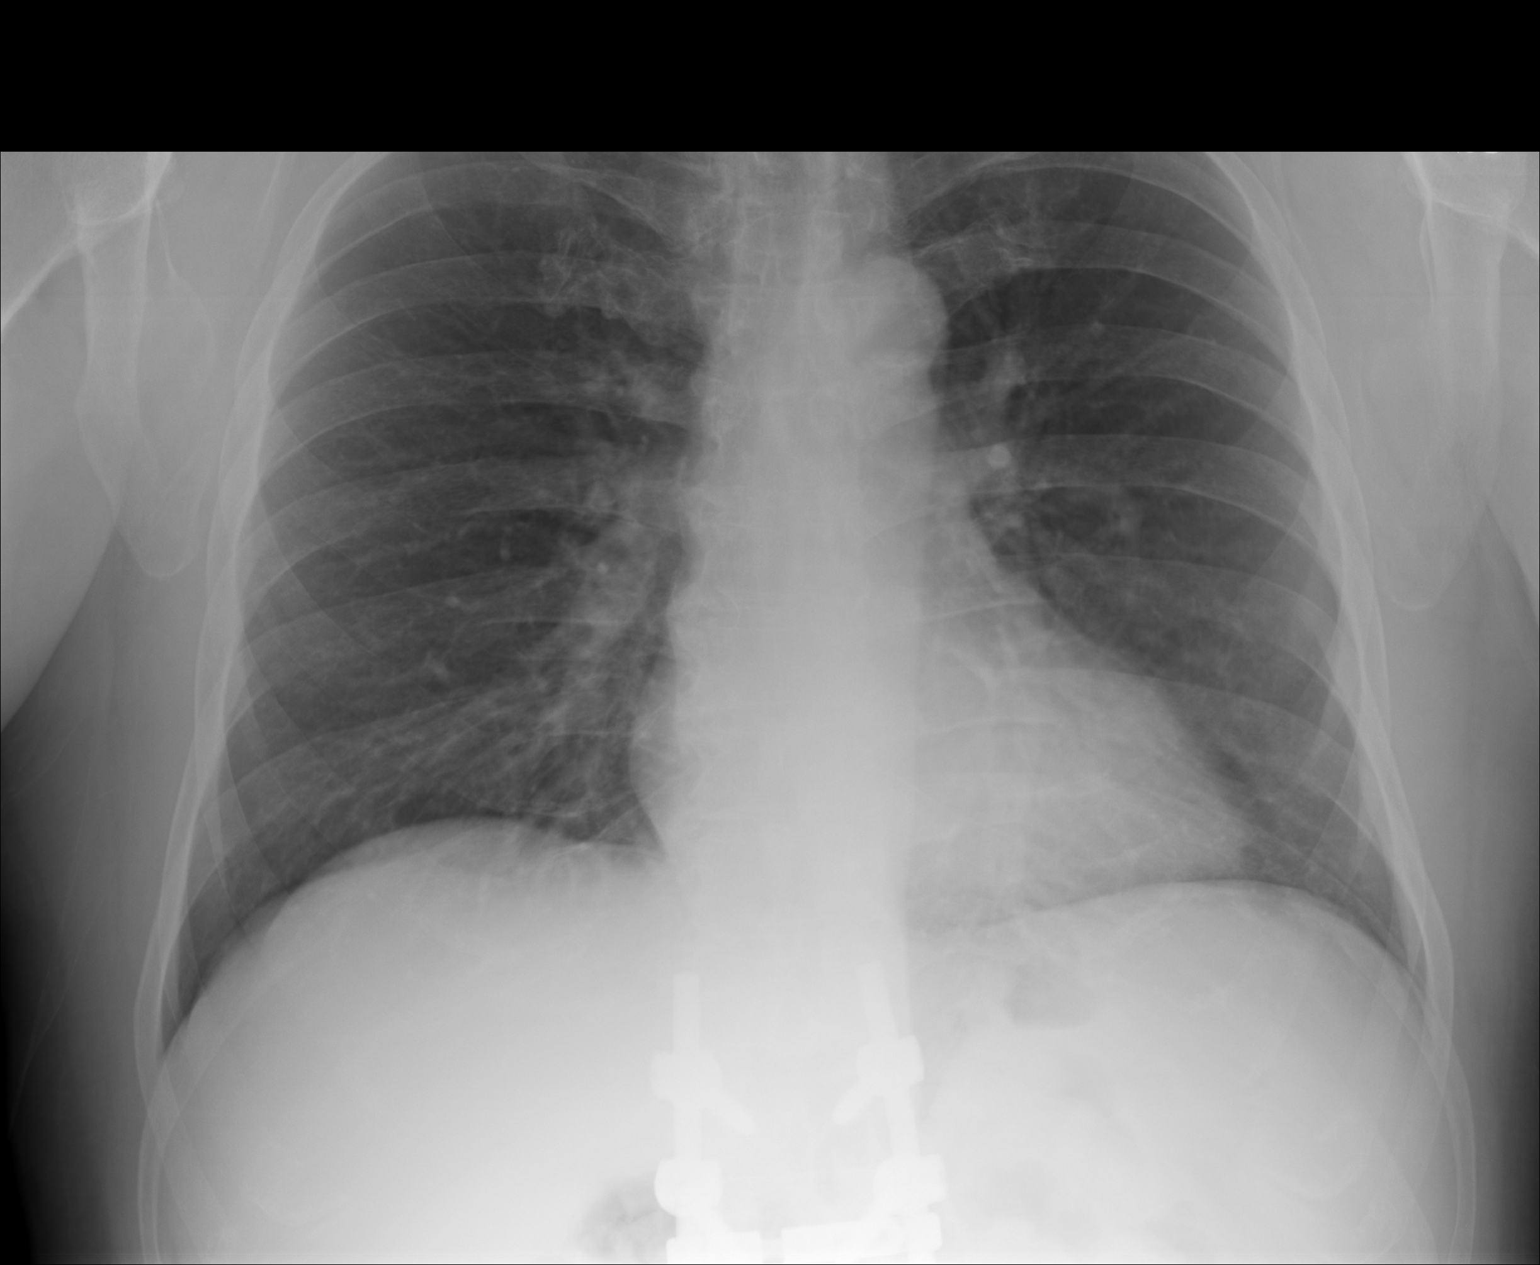
[im 3/3]
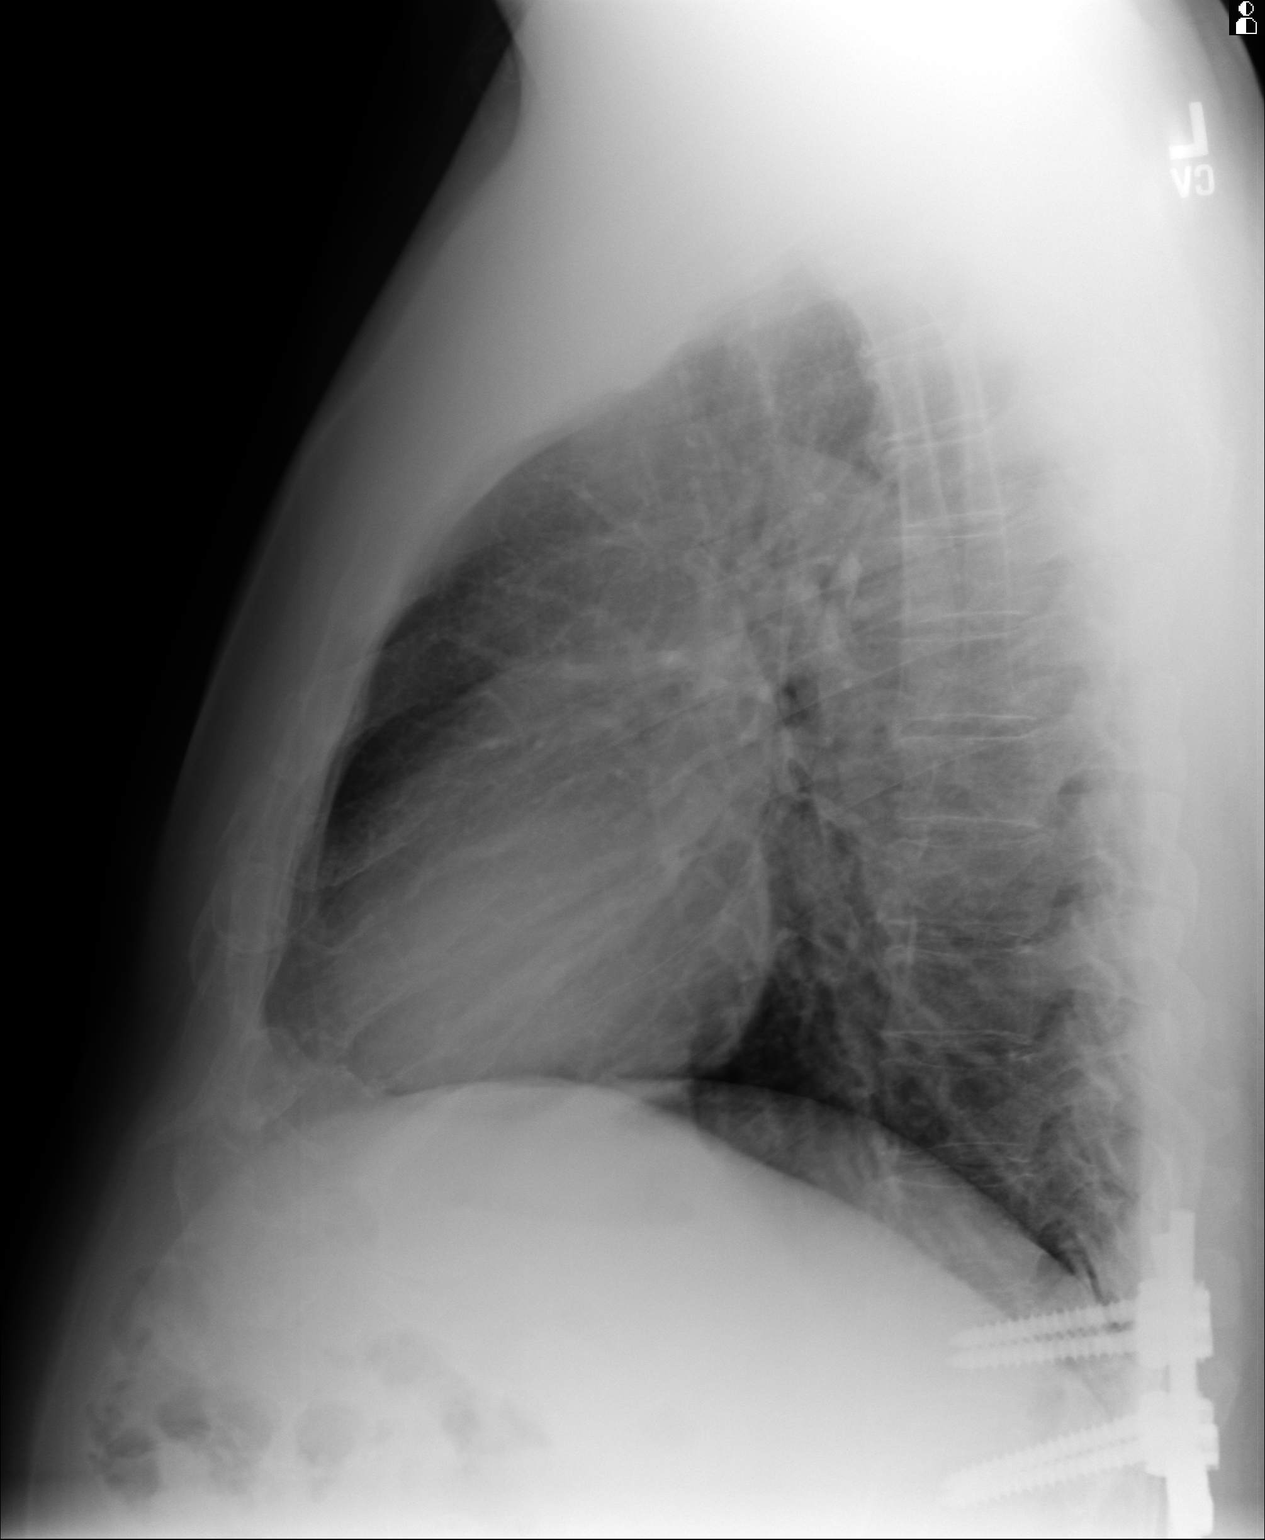

[3 of 3 positions shown; findings below may reference images not displayed]

PROCEDURE:     KDR - KDXR CHEST PA (OR AP) AND LAT  - [DATE]  [DATE]

RESULT:     Comparison is made to the study [DATE]. There is
mild hyperinflation. Spinal stabilization rods and pedicle screws are
present over the thoracolumbar region along the lower margin of the image.
The lungs are clear. The cardiac silhouette is normal. Some degenerative
spurring is seen in the spine.
IMPRESSION: Mild hyperinflation. No acute cardiopulmonary disease
evident

[REDACTED]

## 2012-07-08 ENCOUNTER — Emergency Department: Payer: Self-pay | Admitting: Emergency Medicine

## 2012-07-08 LAB — BASIC METABOLIC PANEL
Calcium, Total: 8.9 mg/dL (ref 8.5–10.1)
Co2: 29 mmol/L (ref 21–32)
EGFR (African American): >60
EGFR (Non-African Amer.): >60
Glucose: 237 mg/dL — ABNORMAL HIGH (ref 65–99)
Osmolality: 292 (ref 275–301)

## 2012-07-08 LAB — CBC
MCH: 29.9 pg (ref 26.0–34.0)
MCHC: 35.2 g/dL (ref 32.0–36.0)
MCV: 85 fL (ref 80–100)
RBC: 4.88 10*6/uL (ref 4.40–5.90)
WBC: 7.4 10*3/uL (ref 3.8–10.6)

## 2012-07-08 IMAGING — CR DG CHEST 2V
1 series · 4 of 4 positions shown · non-contrast
Comparison: none

REASON FOR EXAM: SOB
COMMENTS:

[Series 1: pa · 0.17mm/px · 4 of 4 slices shown]
[im 1/4]
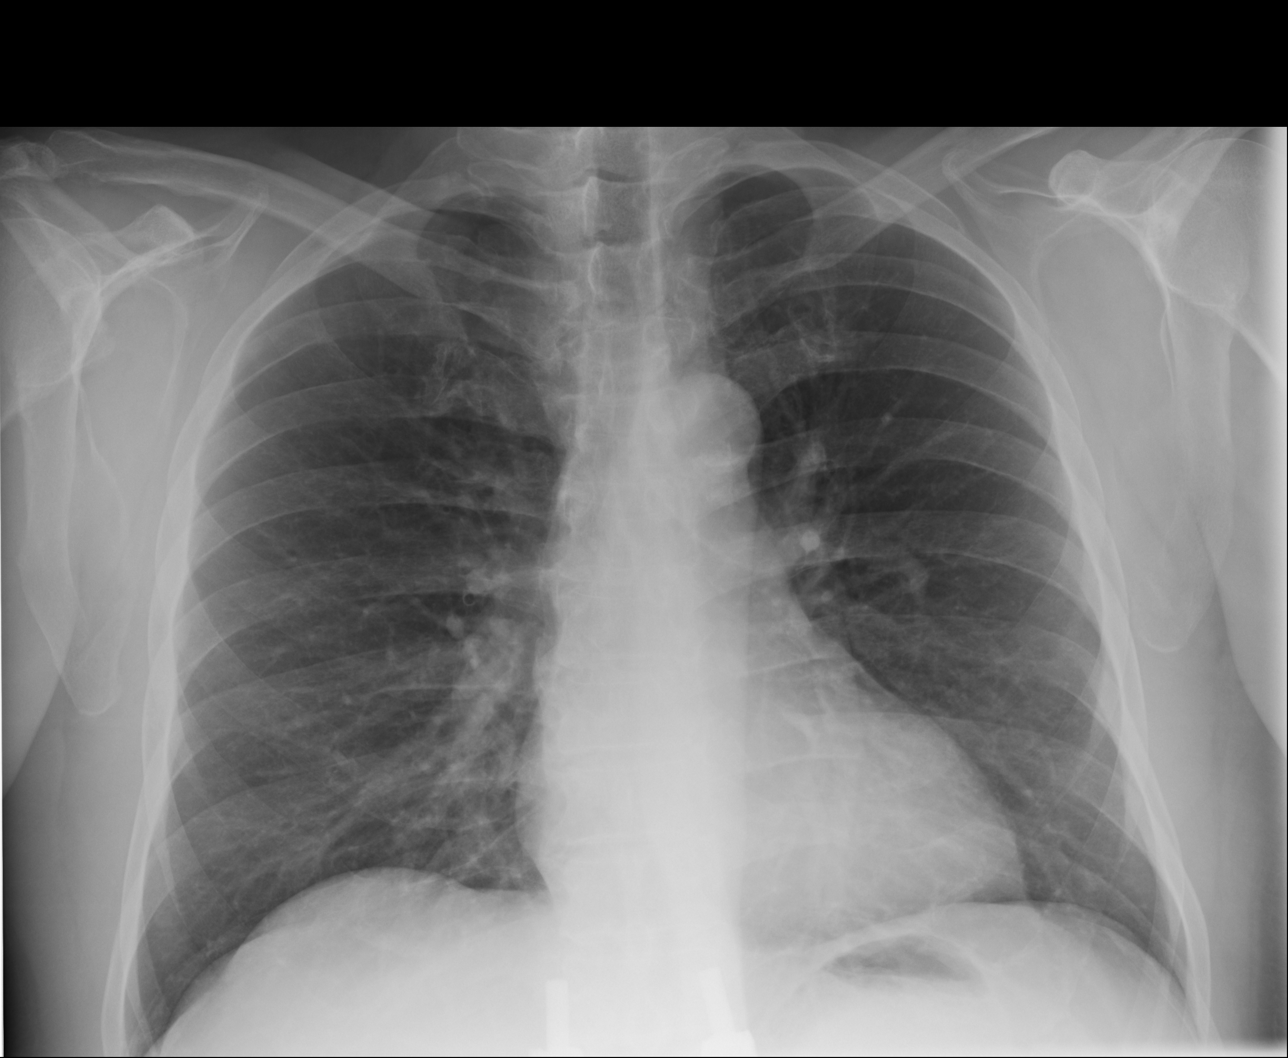
[im 2/4]
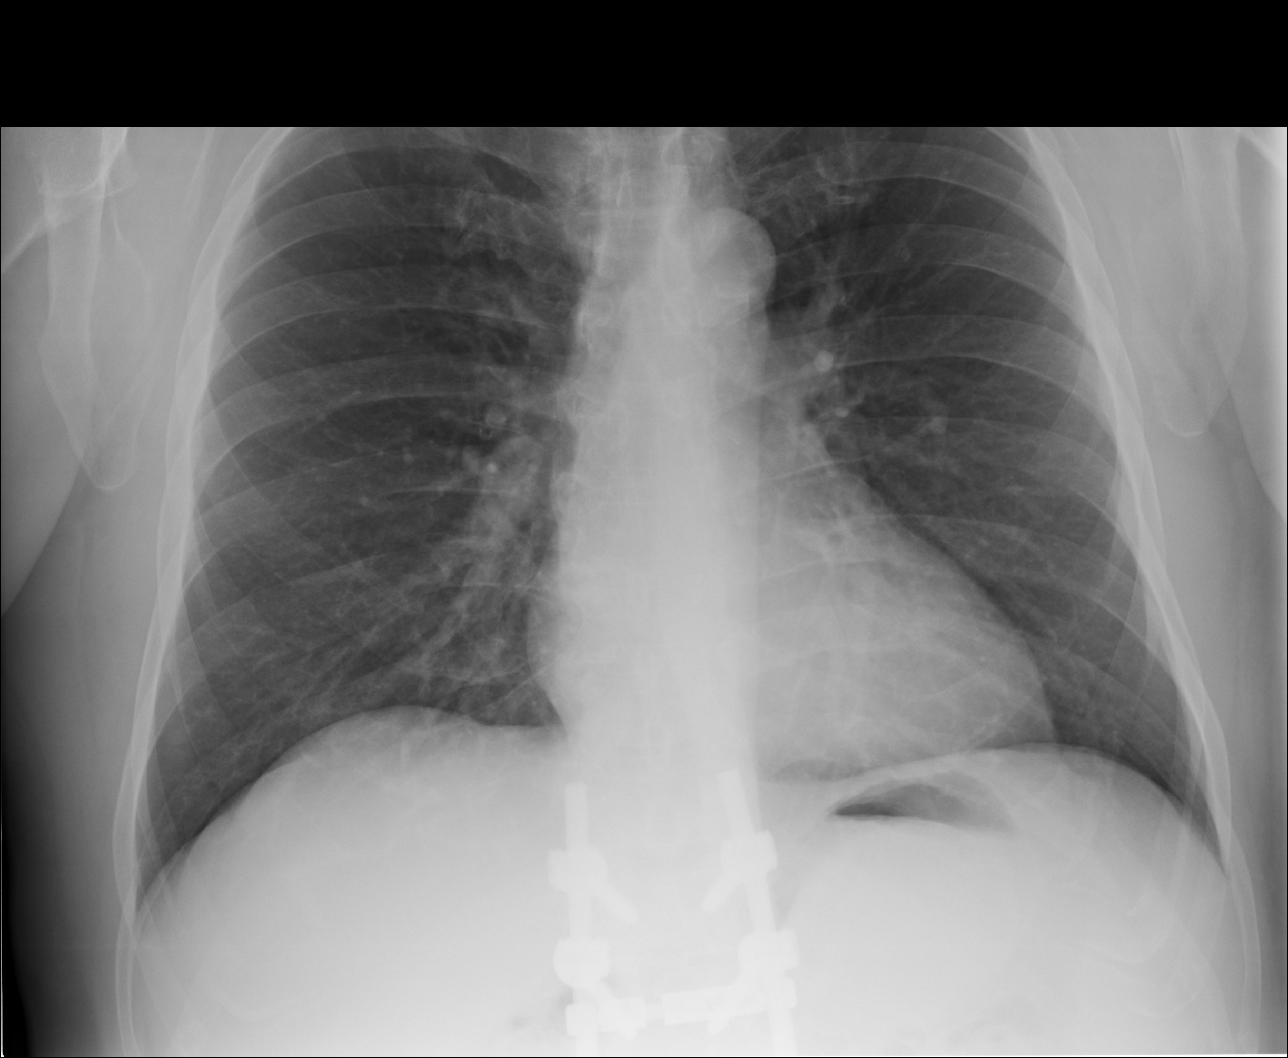
[im 3/4]
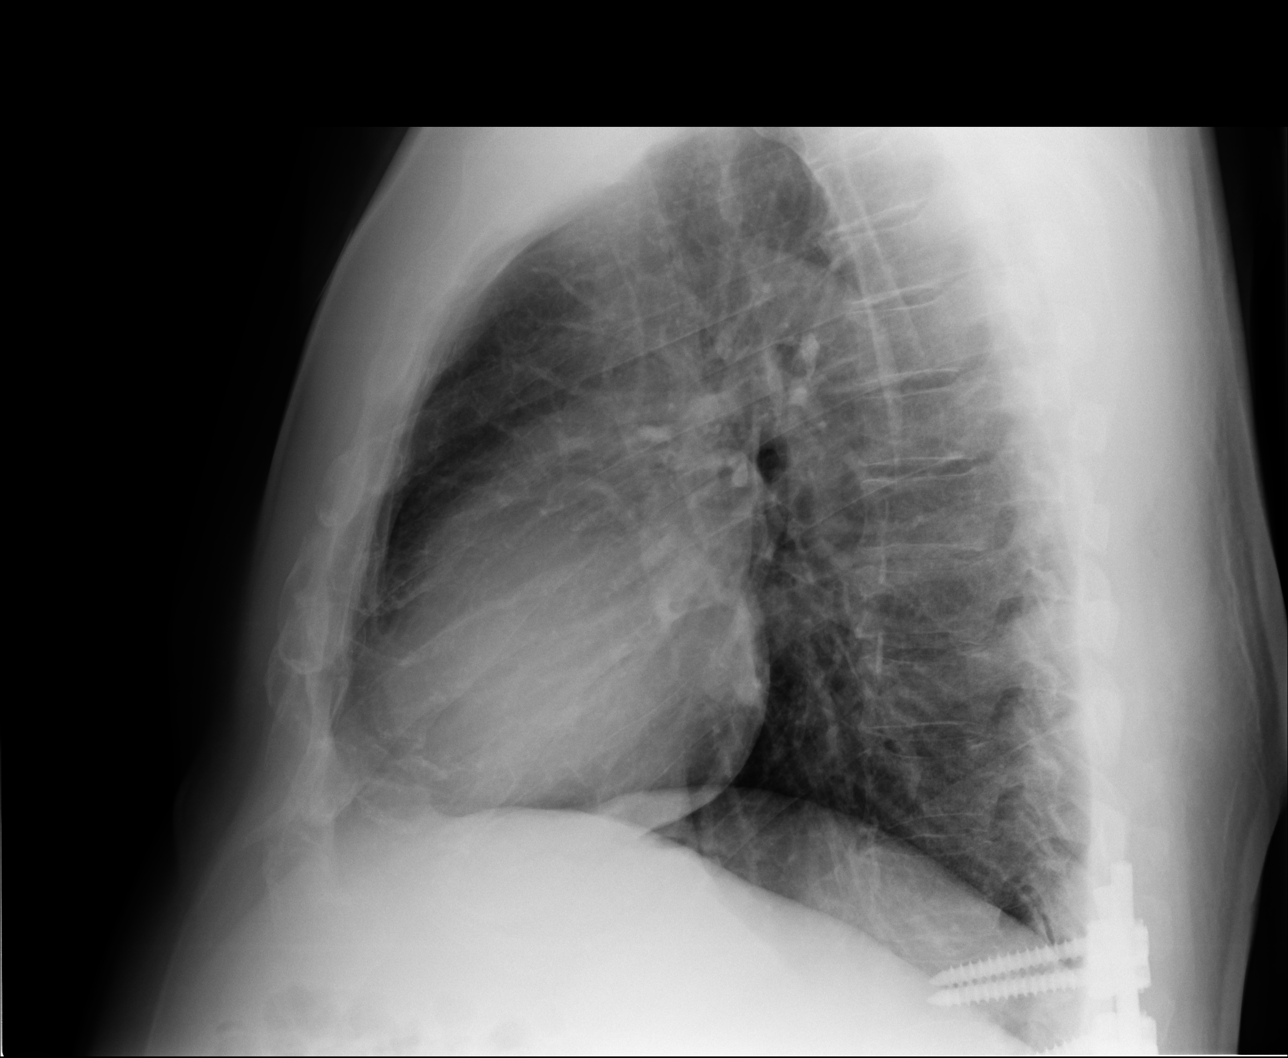
[im 4/4]
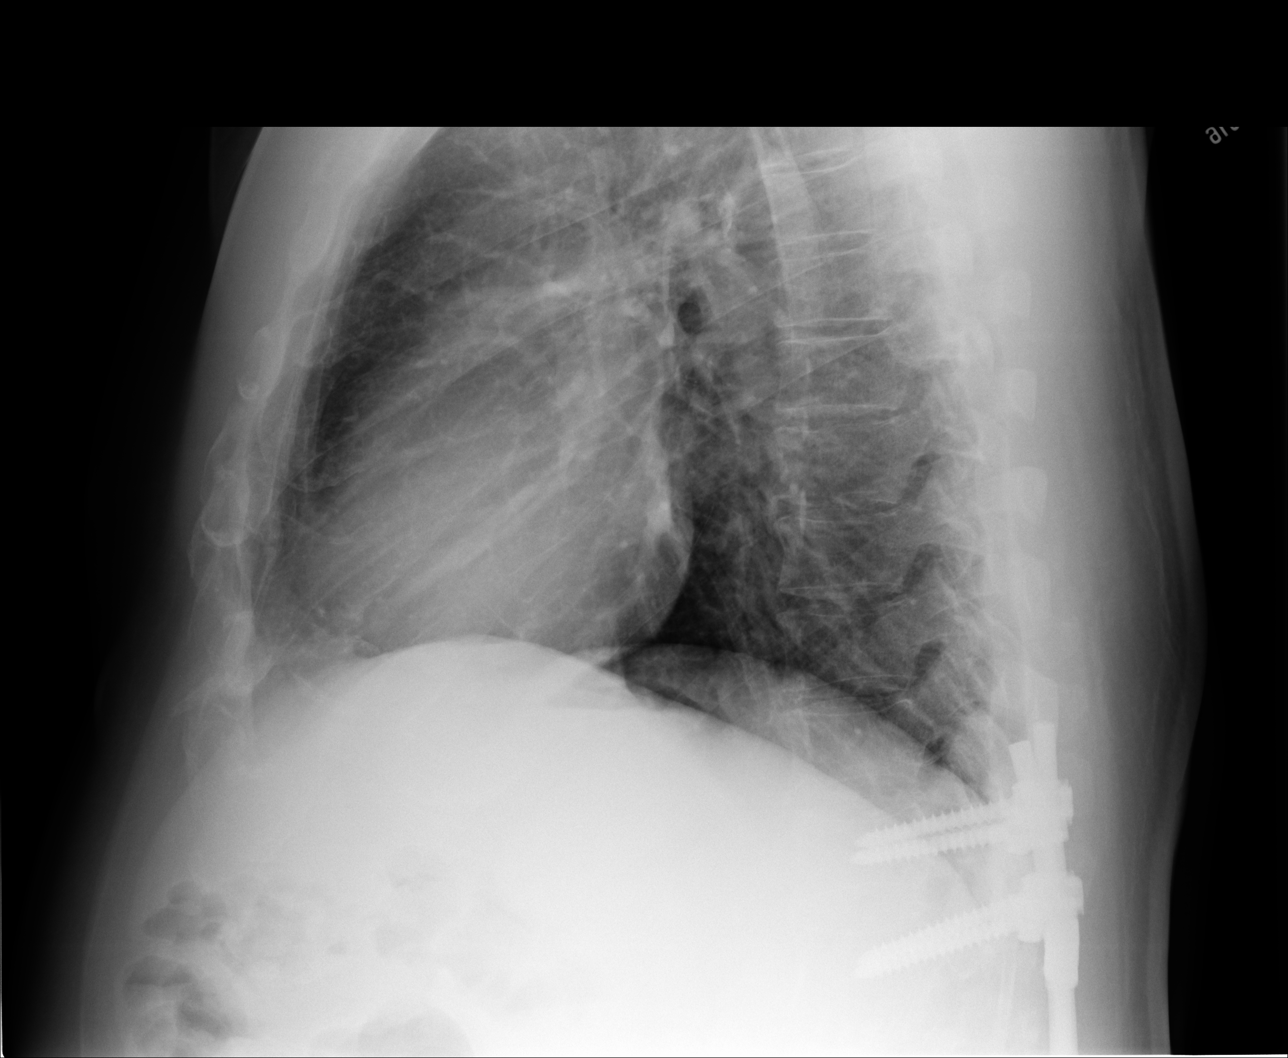

[4 of 4 positions shown; findings below may reference images not displayed]

PROCEDURE:     DXR - DXR CHEST PA (OR AP) AND LATERAL  - [DATE] [DATE]

RESULT:     Comparison is made to the study [DATE].

The lungs are adequately inflated. There is no focal infiltrate. The cardiac
silhouette is normal in size. The pulmonary vascularity is not engorged.
There is no pleural effusion. The mediastinum is normal in width.
IMPRESSION: There is no evidence of acute cardiopulmonary abnormality.
I cannot exclude acute bronchitis in the appropriate clinical setting.

[REDACTED]

## 2013-05-02 ENCOUNTER — Emergency Department: Payer: Self-pay | Admitting: Emergency Medicine

## 2013-05-02 DIAGNOSIS — R42 Dizziness and giddiness: Secondary | ICD-10-CM | POA: Diagnosis not present

## 2013-05-02 DIAGNOSIS — S4980XA Other specified injuries of shoulder and upper arm, unspecified arm, initial encounter: Secondary | ICD-10-CM | POA: Diagnosis not present

## 2013-05-02 LAB — URINALYSIS, COMPLETE
Bacteria: NONE SEEN
Blood: NEGATIVE
Leukocyte Esterase: NEGATIVE
Ph: 5 (ref 4.5–8.0)
Protein: NEGATIVE
Squamous Epithelial: <1

## 2013-05-02 LAB — CBC
HGB: 15 g/dL (ref 13.0–18.0)
MCH: 28.9 pg (ref 26.0–34.0)
Platelet: 213 10*3/uL (ref 150–440)
WBC: 10.5 10*3/uL (ref 3.8–10.6)

## 2013-05-02 LAB — COMPREHENSIVE METABOLIC PANEL
Albumin: 3.8 g/dL (ref 3.4–5.0)
Bilirubin,Total: 0.7 mg/dL (ref 0.2–1.0)
Chloride: 102 mmol/L (ref 98–107)
EGFR (Non-African Amer.): 54 — ABNORMAL LOW
Osmolality: 283 (ref 275–301)
Potassium: 4.1 mmol/L (ref 3.5–5.1)

## 2013-05-02 LAB — TROPONIN I
Troponin-I: <0.02 ng/mL
Troponin-I: <0.02 ng/mL
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
## 2013-05-26 ENCOUNTER — Ambulatory Visit: Payer: Medicare Other | Admitting: Cardiovascular Disease

## 2013-09-17 DIAGNOSIS — J019 Acute sinusitis, unspecified: Secondary | ICD-10-CM | POA: Diagnosis not present

## 2013-10-03 ENCOUNTER — Encounter: Payer: Self-pay | Admitting: Cardiovascular Disease

## 2013-10-03 ENCOUNTER — Ambulatory Visit (INDEPENDENT_AMBULATORY_CARE_PROVIDER_SITE_OTHER): Payer: Medicare Other | Admitting: Cardiovascular Disease

## 2013-10-03 VITALS — BP 140/72 | HR 71 | Ht 72.0 in | Wt 229.5 lb

## 2013-10-03 DIAGNOSIS — I639 Cerebral infarction, unspecified: Secondary | ICD-10-CM | POA: Insufficient documentation

## 2013-10-03 DIAGNOSIS — I6529 Occlusion and stenosis of unspecified carotid artery: Secondary | ICD-10-CM

## 2013-10-03 DIAGNOSIS — R079 Chest pain, unspecified: Secondary | ICD-10-CM

## 2013-10-03 DIAGNOSIS — I6350 Cerebral infarction due to unspecified occlusion or stenosis of unspecified cerebral artery: Secondary | ICD-10-CM | POA: Diagnosis not present

## 2013-10-03 DIAGNOSIS — E1165 Type 2 diabetes mellitus with hyperglycemia: Secondary | ICD-10-CM

## 2013-10-03 DIAGNOSIS — I10 Essential (primary) hypertension: Secondary | ICD-10-CM | POA: Insufficient documentation

## 2013-10-03 DIAGNOSIS — E119 Type 2 diabetes mellitus without complications: Secondary | ICD-10-CM

## 2013-10-03 DIAGNOSIS — R011 Cardiac murmur, unspecified: Secondary | ICD-10-CM | POA: Diagnosis not present

## 2013-10-03 DIAGNOSIS — IMO0001 Reserved for inherently not codable concepts without codable children: Secondary | ICD-10-CM | POA: Insufficient documentation

## 2013-10-03 NOTE — Assessment & Plan Note (Signed)
We have encouraged continued exercise, careful diet management in an effort to lose weight. 

## 2013-10-03 NOTE — Patient Instructions (Signed)
You are doing well. No medication changes were made.  Please call the office if you have additional episodes of chest pain We would do a stress test for more chest pain  Please call us if you have new issues that need to be addressed before your next appt.  Your physician wants you to follow-up in: 6 months.  You will receive a reminder letter in the mail two months in advance. If you don't receive a letter, please call our office to schedule the follow-up appointment.

## 2013-10-03 NOTE — Assessment & Plan Note (Addendum)
Notes from Dr.  Jacqualine Code reviewed . Clinical exam suggestive of aortic valve sclerosis. Prior echocardiogram in October 2013 suggesting mild calcified aortic valve leaflets, no significant stenosis. As he is asymptomatic, and he has had a recent echocardiogram, no new echocardiogram ordered.

## 2013-10-03 NOTE — Assessment & Plan Note (Signed)
Moderate carotid disease in 06/2012.  We will recommend repeat carotid ultrasound at his convenience
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
## 2013-10-03 NOTE — Assessment & Plan Note (Signed)
Blood pressure is well controlled on today's visit. No changes made to the medications. 

## 2013-10-03 NOTE — Assessment & Plan Note (Addendum)
Prior hospital records reviewed. Prior CVA, likely from underlying carotid arterial disease. We'll need to continue aggressive cholesterol management. LDL goal less than 70, continue aspirin

## 2013-10-04 ENCOUNTER — Telehealth: Payer: Self-pay

## 2013-10-04 DIAGNOSIS — I6529 Occlusion and stenosis of unspecified carotid artery: Secondary | ICD-10-CM

## 2013-10-04 DIAGNOSIS — Z8673 Personal history of transient ischemic attack (TIA), and cerebral infarction without residual deficits: Secondary | ICD-10-CM

## 2013-10-04 NOTE — Telephone Encounter (Signed)
Spoke w/ pt.  Advised him that Dr. Rockey Situ would like to order a carotid u/s, as previous test in 10/13 revealed 48% blockage and he would like to repeat this. Pt is agreeable to this and is scheduled for 10/11/13 @ 3:30.

## 2013-10-11 ENCOUNTER — Encounter (INDEPENDENT_AMBULATORY_CARE_PROVIDER_SITE_OTHER): Payer: Medicare Other

## 2013-10-11 DIAGNOSIS — Z8673 Personal history of transient ischemic attack (TIA), and cerebral infarction without residual deficits: Secondary | ICD-10-CM

## 2013-10-11 DIAGNOSIS — I6529 Occlusion and stenosis of unspecified carotid artery: Secondary | ICD-10-CM

## 2013-10-24 DIAGNOSIS — R011 Cardiac murmur, unspecified: Secondary | ICD-10-CM | POA: Diagnosis not present

## 2013-10-24 DIAGNOSIS — E785 Hyperlipidemia, unspecified: Secondary | ICD-10-CM | POA: Diagnosis not present

## 2013-10-24 DIAGNOSIS — E119 Type 2 diabetes mellitus without complications: Secondary | ICD-10-CM | POA: Diagnosis not present

## 2013-10-26 DIAGNOSIS — E119 Type 2 diabetes mellitus without complications: Secondary | ICD-10-CM | POA: Diagnosis not present

## 2013-10-26 DIAGNOSIS — I6350 Cerebral infarction due to unspecified occlusion or stenosis of unspecified cerebral artery: Secondary | ICD-10-CM | POA: Diagnosis not present

## 2013-10-26 DIAGNOSIS — M79609 Pain in unspecified limb: Secondary | ICD-10-CM | POA: Diagnosis not present

## 2013-11-03 DIAGNOSIS — E109 Type 1 diabetes mellitus without complications: Secondary | ICD-10-CM | POA: Diagnosis not present

## 2013-11-03 DIAGNOSIS — B351 Tinea unguium: Secondary | ICD-10-CM | POA: Diagnosis not present

## 2013-11-03 DIAGNOSIS — Q666 Other congenital valgus deformities of feet: Secondary | ICD-10-CM | POA: Diagnosis not present

## 2013-11-23 DIAGNOSIS — E785 Hyperlipidemia, unspecified: Secondary | ICD-10-CM | POA: Diagnosis not present

## 2013-11-23 DIAGNOSIS — E119 Type 2 diabetes mellitus without complications: Secondary | ICD-10-CM | POA: Diagnosis not present

## 2013-11-23 DIAGNOSIS — Z9181 History of falling: Secondary | ICD-10-CM | POA: Diagnosis not present

## 2013-11-23 DIAGNOSIS — Z1331 Encounter for screening for depression: Secondary | ICD-10-CM | POA: Diagnosis not present

## 2014-02-23 DIAGNOSIS — E785 Hyperlipidemia, unspecified: Secondary | ICD-10-CM | POA: Diagnosis not present

## 2014-02-23 DIAGNOSIS — E119 Type 2 diabetes mellitus without complications: Secondary | ICD-10-CM | POA: Diagnosis not present

## 2014-02-23 DIAGNOSIS — I10 Essential (primary) hypertension: Secondary | ICD-10-CM | POA: Diagnosis not present

## 2014-02-23 DIAGNOSIS — I69993 Ataxia following unspecified cerebrovascular disease: Secondary | ICD-10-CM | POA: Diagnosis not present

## 2014-02-23 DIAGNOSIS — R011 Cardiac murmur, unspecified: Secondary | ICD-10-CM | POA: Diagnosis not present

## 2014-04-03 ENCOUNTER — Ambulatory Visit: Payer: Medicare Other | Admitting: Cardiovascular Disease

## 2014-04-10 DIAGNOSIS — E119 Type 2 diabetes mellitus without complications: Secondary | ICD-10-CM | POA: Diagnosis not present

## 2014-04-10 DIAGNOSIS — R011 Cardiac murmur, unspecified: Secondary | ICD-10-CM | POA: Diagnosis not present

## 2014-04-10 DIAGNOSIS — I10 Essential (primary) hypertension: Secondary | ICD-10-CM | POA: Diagnosis not present

## 2014-04-21 ENCOUNTER — Ambulatory Visit: Payer: Medicare Other | Admitting: Cardiovascular Disease

## 2014-04-28 ENCOUNTER — Ambulatory Visit (INDEPENDENT_AMBULATORY_CARE_PROVIDER_SITE_OTHER): Payer: Medicare Other | Admitting: Cardiovascular Disease

## 2014-04-28 ENCOUNTER — Encounter: Payer: Self-pay | Admitting: Cardiovascular Disease

## 2014-04-28 VITALS — BP 140/60 | HR 61 | Ht 72.0 in | Wt 229.2 lb

## 2014-04-28 DIAGNOSIS — I6350 Cerebral infarction due to unspecified occlusion or stenosis of unspecified cerebral artery: Secondary | ICD-10-CM

## 2014-04-28 DIAGNOSIS — E119 Type 2 diabetes mellitus without complications: Secondary | ICD-10-CM

## 2014-04-28 DIAGNOSIS — R002 Palpitations: Secondary | ICD-10-CM | POA: Diagnosis not present

## 2014-04-28 DIAGNOSIS — I10 Essential (primary) hypertension: Secondary | ICD-10-CM | POA: Diagnosis not present

## 2014-04-28 DIAGNOSIS — I6529 Occlusion and stenosis of unspecified carotid artery: Secondary | ICD-10-CM | POA: Diagnosis not present

## 2014-04-28 DIAGNOSIS — R011 Cardiac murmur, unspecified: Secondary | ICD-10-CM | POA: Diagnosis not present

## 2014-04-28 DIAGNOSIS — I6521 Occlusion and stenosis of right carotid artery: Secondary | ICD-10-CM

## 2014-04-28 DIAGNOSIS — I639 Cerebral infarction, unspecified: Secondary | ICD-10-CM

## 2014-04-28 NOTE — Assessment & Plan Note (Signed)
Recheck of his blood pressure within adequate range. No medication changes made

## 2014-04-28 NOTE — Assessment & Plan Note (Signed)
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

## 2014-04-28 NOTE — Assessment & Plan Note (Signed)
Weight has been trending down. He is requiring less insulin.

## 2014-04-28 NOTE — Assessment & Plan Note (Signed)
Mild to moderate but stable carotid arterial disease on the right. Minimal disease on the left Repeat ultrasound in 2 years

## 2014-04-28 NOTE — Patient Instructions (Signed)
You are doing well. No medication changes were made.  Please call us if you have new issues that need to be addressed before your next appt.  Your physician wants you to follow-up in: 12 months.  You will receive a reminder letter in the mail two months in advance. If you don't receive a letter, please call our office to schedule the follow-up appointment. 
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
## 2014-04-28 NOTE — Assessment & Plan Note (Signed)
Prior stroke, recommended he continue on his aspirin

## 2014-05-23 DIAGNOSIS — H2510 Age-related nuclear cataract, unspecified eye: Secondary | ICD-10-CM | POA: Diagnosis not present

## 2014-06-26 ENCOUNTER — Ambulatory Visit: Payer: Self-pay | Admitting: Family Medicine

## 2014-06-26 DIAGNOSIS — M542 Cervicalgia: Secondary | ICD-10-CM | POA: Diagnosis not present

## 2014-06-26 DIAGNOSIS — M47812 Spondylosis without myelopathy or radiculopathy, cervical region: Secondary | ICD-10-CM | POA: Diagnosis not present

## 2014-06-26 IMAGING — CR CERVICAL SPINE - 2-3 VIEW
2 series · 4 of 4 positions shown · non-contrast
Comparison: Prior duplex carotid ultrasound [DATE]

CLINICAL DATA: 68-year-old male with left neck pain radiating into
left shoulder and decreased range of motion

EXAM:
CERVICAL SPINE - 2-3 VIEW

[Series 1: kdxr c-spine ap and lateral · 0.14mm/px · 3 of 3 slices shown (1 of 2)]
[im 1/3]
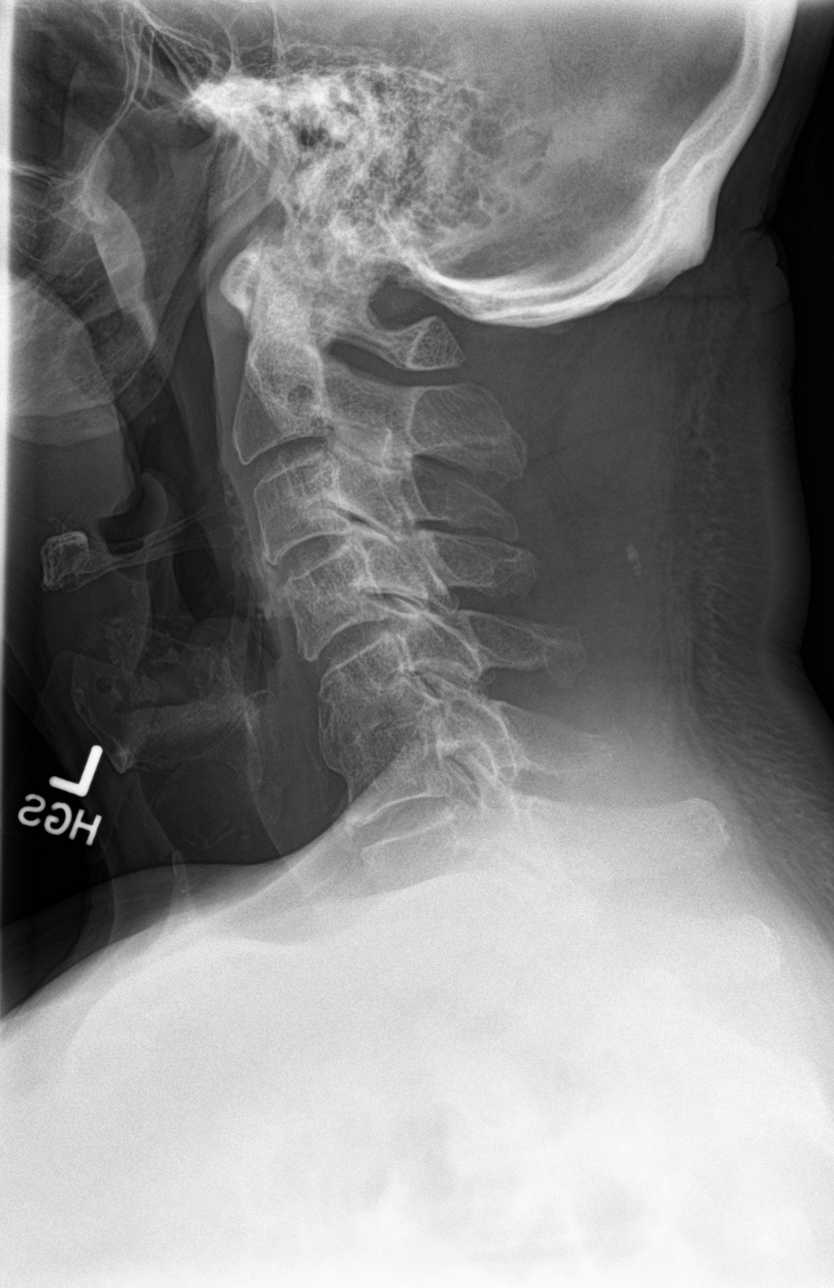
[im 2/3]
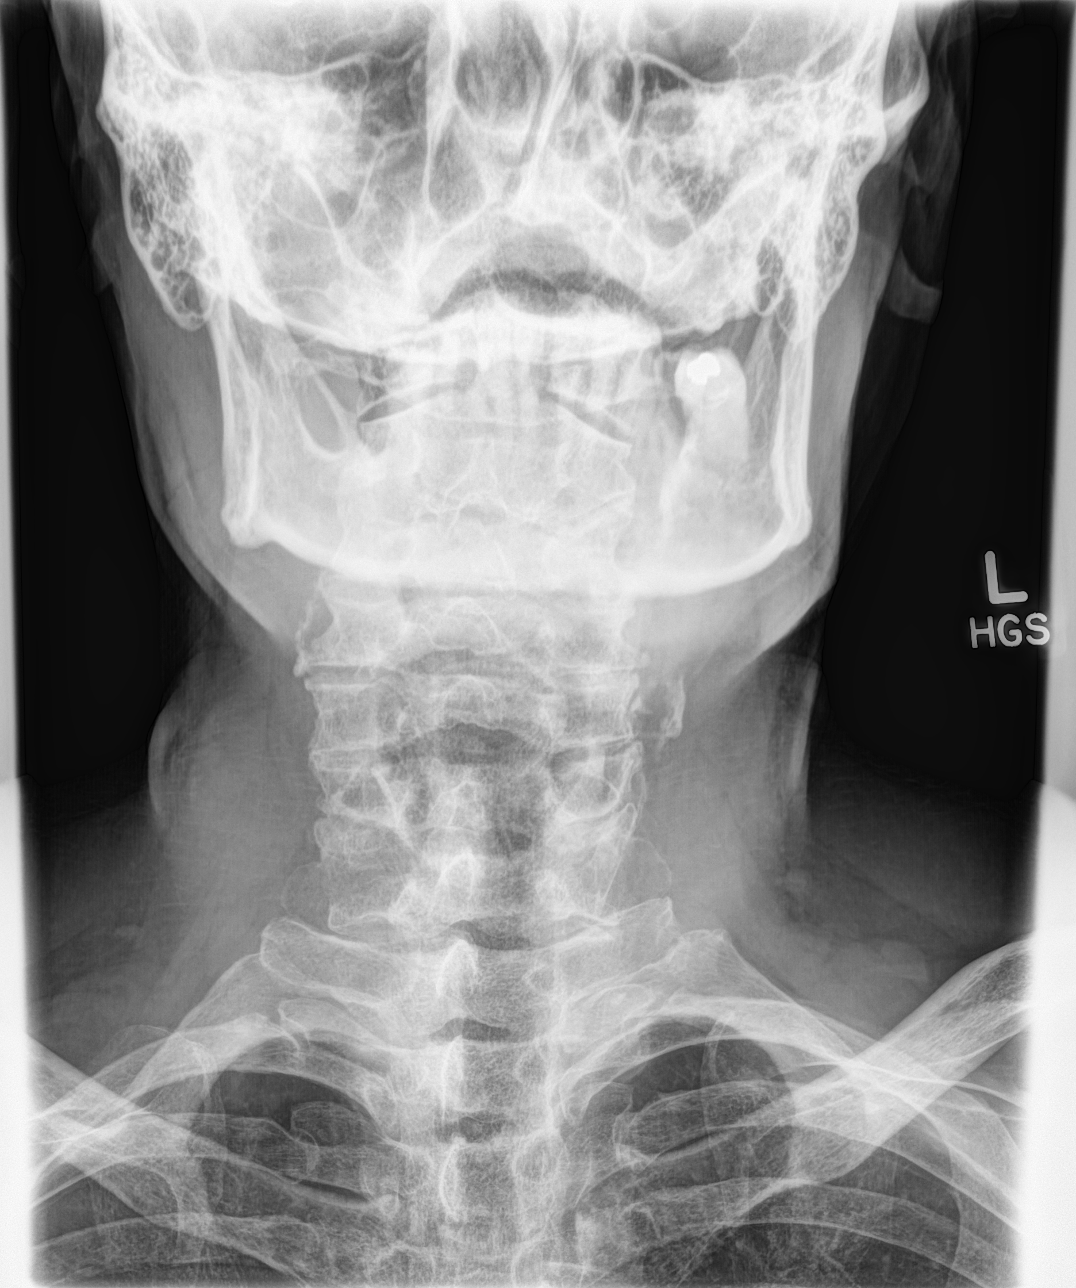
[im 3/3]
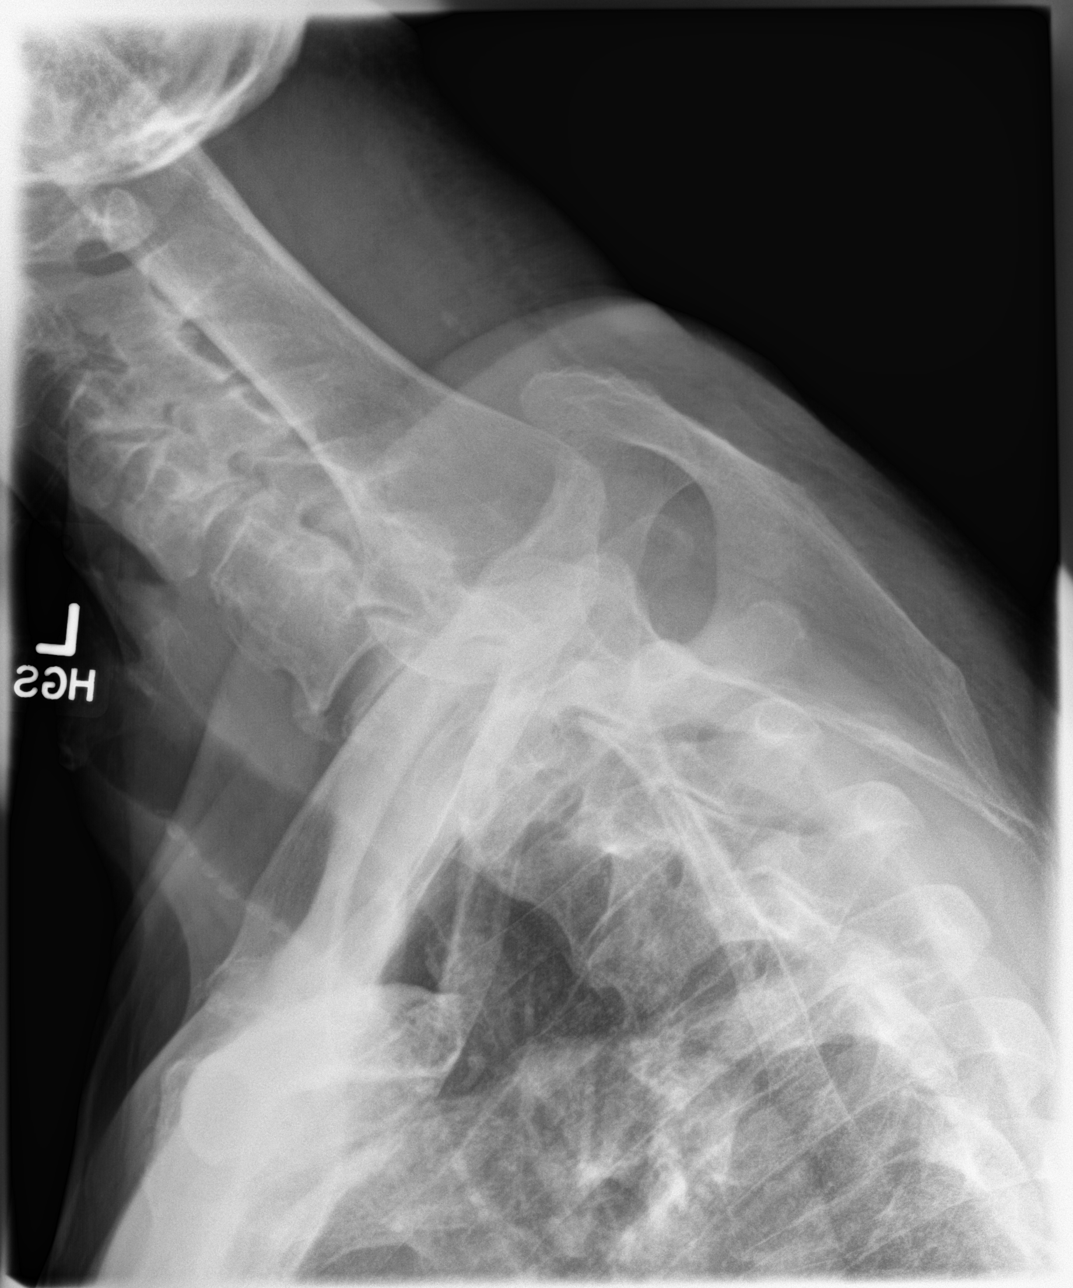

[kdxr c-spine ap and lateral (2 of 2)]
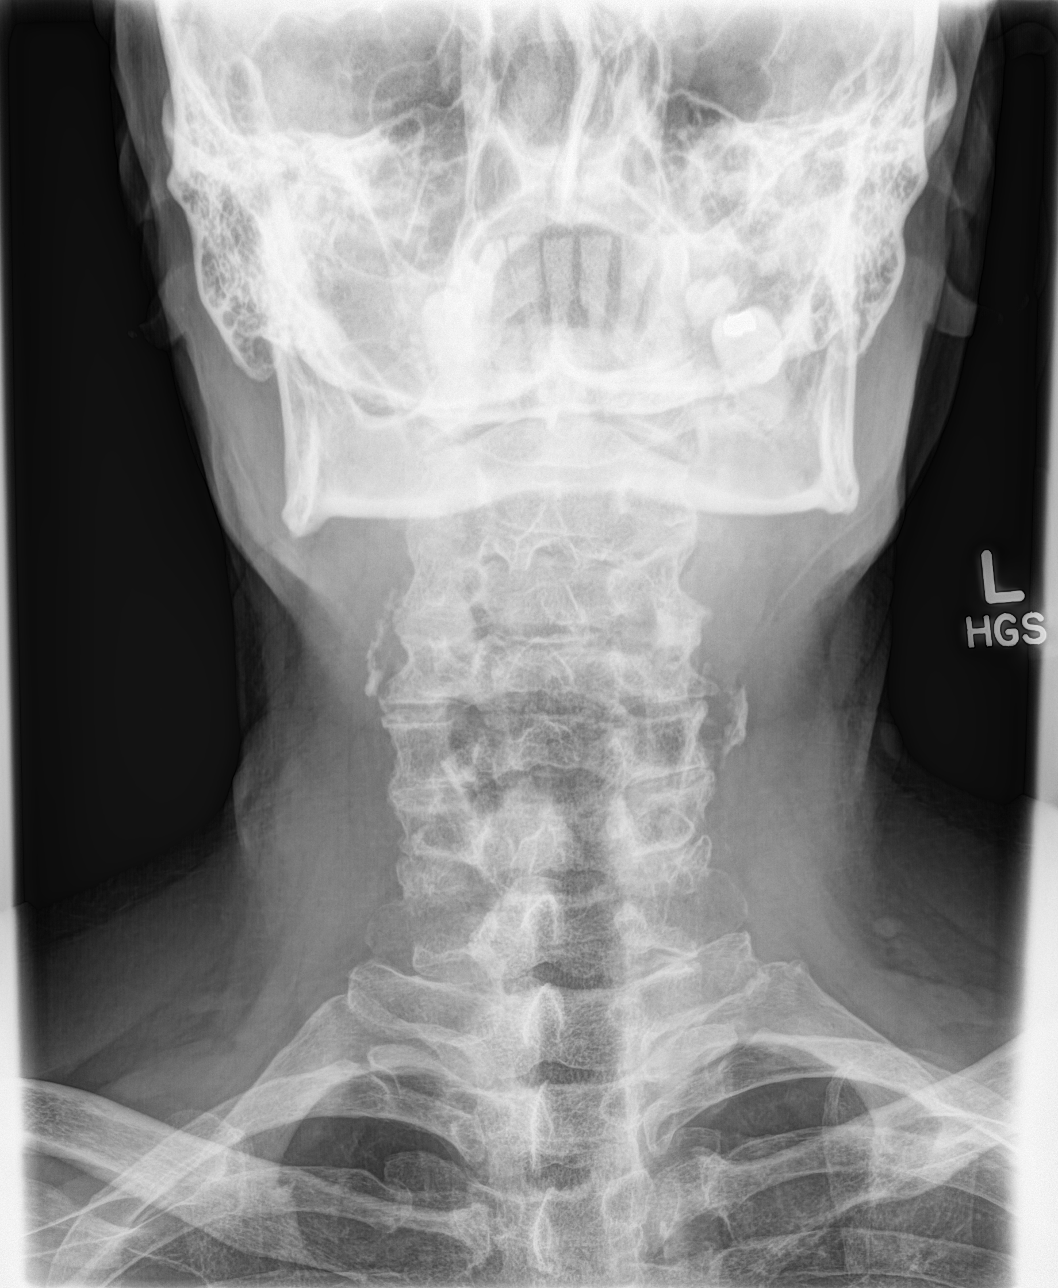

[4 of 4 positions shown; findings below may reference images not displayed]

FINDINGS: Atherosclerotic vascular calcifications noted in the bilateral
carotid arteries. Carotid artery disease was previously documented
by carotid duplex ultrasonography. No acute fracture, malalignment
or prevertebral soft tissue swelling. Multilevel cervical
spondylosis. There is significant disc space narrowing and partial
ankylosis of C5-C6 which results in straightening of the normal
cervical lordosis. Facet arthropathy also present at C5-C6 and
C6-C7. Normal bony mineralization. No acute osseous abnormality.
IMPRESSION: Focal cervical spondylosis with near-total loss of disc space height
at C5-C6 resulting in ankylosis of the vertebral bodies anteriorly
and straightening of the normal cervical lordosis.

## 2014-07-05 DIAGNOSIS — M542 Cervicalgia: Secondary | ICD-10-CM | POA: Diagnosis not present

## 2014-07-11 DIAGNOSIS — E119 Type 2 diabetes mellitus without complications: Secondary | ICD-10-CM | POA: Diagnosis not present

## 2014-07-11 DIAGNOSIS — E784 Other hyperlipidemia: Secondary | ICD-10-CM | POA: Diagnosis not present

## 2014-07-11 DIAGNOSIS — R270 Ataxia, unspecified: Secondary | ICD-10-CM | POA: Diagnosis not present

## 2014-07-11 DIAGNOSIS — I10 Essential (primary) hypertension: Secondary | ICD-10-CM | POA: Diagnosis not present

## 2014-11-07 DIAGNOSIS — I10 Essential (primary) hypertension: Secondary | ICD-10-CM | POA: Diagnosis not present

## 2014-11-07 DIAGNOSIS — R270 Ataxia, unspecified: Secondary | ICD-10-CM | POA: Diagnosis not present

## 2014-11-07 DIAGNOSIS — E119 Type 2 diabetes mellitus without complications: Secondary | ICD-10-CM | POA: Diagnosis not present

## 2014-11-07 DIAGNOSIS — E784 Other hyperlipidemia: Secondary | ICD-10-CM | POA: Diagnosis not present
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
## 2014-12-19 NOTE — Consult Note (Signed)
Brief Consult Note: Diagnosis: lumbosacral sprain.   Patient was seen by consultant.   Recommend further assessment or treatment.   Orders entered.   Comments: I do not think there is a new fracture at L2, tenderness is on left at [facility] joint and llumbosacral junction.  No fracture in this are on CT. recommend PT to help with ambulation.  Electronic Signatures: Laurene Footman (MD)  (Signed 07-Aug-13 12:42)  Authored: Brief Consult Note   Last Updated: 07-Aug-13 12:42 by Laurene Footman (MD)
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
## 2014-12-19 NOTE — Discharge Summary (Signed)
PATIENT NAME:  Victor Fox, Victor Fox MR#:  [id number] DATE OF BIRTH:  [birth year]  DATE OF ADMISSION:  06/29/2012 DATE OF DISCHARGE:  07/01/2012  REASON FOR ADMISSION: Left side weakness and slurred speech.   FINAL DIAGNOSES: 1. Acute stroke.  2. Hypertension.  3. Malignant hypertension.  4. Uncontrolled type II diabetes.  5. Hyperlipidemia.  6. Diabetic neuropathy.  7. Osteoarthritis.  8. Colon polyps.   HOSPITAL COURSE: [name] is a very nice 69 year old gentleman with history coming to the ER to be examined due to left side weakness and slurred speech. Once the patient was able to be seen by the ER doctor, the symptoms have mostly resolved for which the patient was sent home the same day for follow-up with his primary care physician. The patient presented to the ER physician and at that time he was having slurred speech. The patient came in to [facility] and on admission he had mostly improvement of his weakness with no significant amount of slurred speech. The patient was admitted for workup. He had an echocardiogram that showed an ejection fraction above 55%. Transmitral spectral Doppler flow pattern was suggestive of impaired LV relaxation. The rest was within normal limits except for some severe concentric left ventricular hypertrophy. The patient had some metal around the eye for what he was not able to complete the MRI. CT scan showed a small focus of hypoattenuation in the right thalamus which could represent an age indeterminate lacunar infarct. It is somewhat ill-defined and may be acute. Ultrasound of the neck showed atherosclerotic disease bilaterally. The most prominent area of plaque appears to be in the mid right common carotid with an estimated stenosis of 48%. No definite hemodynamically significant stenosis is demonstrated. Blood sugars were in the 170's to 120's. creatinine within normal limits. Electrolytes within normal limits. LDL cholesterol 110, triglycerides 359, HDL 30.  The patient been seen by neurology and Neurology agrees with medication management and agree with letting him go.   DISPOSITION: Home.   ACTIVITY: As tolerated.   MEDICATIONS:  1. Novolin 70/30 75 units twice a day. 2. Aspirin 325 mg once daily.  3. Enalapril 20 mg one twice a day, start on 07/03/2012.  4. Norvasc 10 mg once daily, start on 07/03/2012.  5. Atorvastatin 20 mg orally once daily.   DO NOT TAKE: Hydrochlorothiazide.   The patient is to be discharged with outpatient physical therapy. He is already familiar with this company.   CONDITION ON DISCHARGE: Good.   ____________________________ [place] Sink, MD rsg:drc D: 07/02/2012 01:23:11 ET T: 07/02/2012 08:46:34 ET JOB#: [id number]  cc: [place] Sink, MD, <Dictator> Kahlen Morais America Brown MD ELECTRONICALLY SIGNED 07/05/2012 21:07

## 2014-12-19 NOTE — Discharge Summary (Signed)
PATIENT NAME:  Victor Fox, Victor Fox MR#:  [id number] DATE OF BIRTH:  [birth year]  DATE OF ADMISSION:  06/29/2012 DATE OF DISCHARGE:  07/01/2012  ADDENDUM:   TIME SPENT WITH DISCHARGE: About 45 minutes. Instructions were given to the patient as far as diet, blood pressure control, etc.   ____________________________ [place] Sink, MD rsg:drc D: 07/02/2012 01:39:59 ET T: 07/02/2012 09:10:45 ET JOB#: [id number]  cc: [place] Sink, MD, <Dictator> Danuel Felicetti America Brown MD ELECTRONICALLY SIGNED 07/05/2012 21:07
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
## 2015-01-18 DIAGNOSIS — G629 Polyneuropathy, unspecified: Secondary | ICD-10-CM | POA: Diagnosis not present

## 2015-01-18 DIAGNOSIS — I10 Essential (primary) hypertension: Secondary | ICD-10-CM | POA: Diagnosis not present

## 2015-01-18 DIAGNOSIS — E785 Hyperlipidemia, unspecified: Secondary | ICD-10-CM | POA: Diagnosis not present

## 2015-01-18 DIAGNOSIS — Z794 Long term (current) use of insulin: Secondary | ICD-10-CM | POA: Diagnosis not present

## 2015-01-18 DIAGNOSIS — E119 Type 2 diabetes mellitus without complications: Secondary | ICD-10-CM | POA: Diagnosis not present

## 2015-02-06 ENCOUNTER — Telehealth: Payer: Self-pay | Admitting: *Deleted

## 2015-02-06 NOTE — Telephone Encounter (Signed)
Lmom to call our office. Time to schedule a carotid u/s (1yr f/u). 

## 2015-05-09 ENCOUNTER — Telehealth: Payer: Self-pay | Admitting: Cardiovascular Disease

## 2015-05-09 NOTE — Telephone Encounter (Signed)
Spoke w/ pt.  Advised him that Dr. Donivan Scull notes indicate that pt's stroke was in Oct 2013. He is appreciative and will call back w/ any further questions or concerns.

## 2015-05-09 NOTE — Telephone Encounter (Signed)
Wants to know date of stroke for insurance records please call.

## 2015-05-10 ENCOUNTER — Other Ambulatory Visit: Payer: Self-pay | Admitting: Cardiovascular Disease

## 2015-05-10 DIAGNOSIS — I6523 Occlusion and stenosis of bilateral carotid arteries: Secondary | ICD-10-CM

## 2015-05-15 ENCOUNTER — Ambulatory Visit (INDEPENDENT_AMBULATORY_CARE_PROVIDER_SITE_OTHER): Payer: Medicare Other

## 2015-05-15 ENCOUNTER — Other Ambulatory Visit: Payer: Self-pay | Admitting: Cardiovascular Disease

## 2015-05-15 DIAGNOSIS — I6523 Occlusion and stenosis of bilateral carotid arteries: Secondary | ICD-10-CM

## 2015-05-21 ENCOUNTER — Other Ambulatory Visit: Payer: Self-pay

## 2015-05-21 DIAGNOSIS — I6529 Occlusion and stenosis of unspecified carotid artery: Secondary | ICD-10-CM

## 2015-07-09 ENCOUNTER — Encounter: Payer: Self-pay | Admitting: Physician Assistant

## 2015-07-09 ENCOUNTER — Ambulatory Visit (INDEPENDENT_AMBULATORY_CARE_PROVIDER_SITE_OTHER): Payer: Medicare Other | Admitting: Physician Assistant

## 2015-07-09 VITALS — BP 140/52 | HR 50 | Ht 72.0 in | Wt 237.5 lb

## 2015-07-09 DIAGNOSIS — I739 Peripheral vascular disease, unspecified: Secondary | ICD-10-CM

## 2015-07-09 DIAGNOSIS — I358 Other nonrheumatic aortic valve disorders: Secondary | ICD-10-CM | POA: Diagnosis not present

## 2015-07-09 DIAGNOSIS — I779 Disorder of arteries and arterioles, unspecified: Secondary | ICD-10-CM | POA: Diagnosis not present

## 2015-07-09 DIAGNOSIS — I10 Essential (primary) hypertension: Secondary | ICD-10-CM | POA: Diagnosis not present

## 2015-07-09 DIAGNOSIS — R011 Cardiac murmur, unspecified: Secondary | ICD-10-CM | POA: Diagnosis not present

## 2015-07-09 DIAGNOSIS — Z8673 Personal history of transient ischemic attack (TIA), and cerebral infarction without residual deficits: Secondary | ICD-10-CM

## 2015-07-09 DIAGNOSIS — E785 Hyperlipidemia, unspecified: Secondary | ICD-10-CM

## 2015-07-09 DIAGNOSIS — I6523 Occlusion and stenosis of bilateral carotid arteries: Secondary | ICD-10-CM

## 2015-07-09 MED ORDER — HYDROCHLOROTHIAZIDE 25 MG PO TABS: 25.0000 mg | ORAL_TABLET | Freq: Every day | ORAL | Status: DC

## 2015-07-09 MED ORDER — ATORVASTATIN CALCIUM 20 MG PO TABS: 20.0000 mg | ORAL_TABLET | Freq: Every day | ORAL | Status: DC

## 2015-07-09 MED ORDER — AMLODIPINE BESYLATE 10 MG PO TABS: 10.0000 mg | ORAL_TABLET | Freq: Every day | ORAL | Status: DC

## 2015-07-09 MED ORDER — ENALAPRIL MALEATE 20 MG PO TABS: 20.0000 mg | ORAL_TABLET | Freq: Every day | ORAL | Status: DC
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
## 2015-07-09 NOTE — Patient Instructions (Addendum)
Medication Instructions:  We have sent in refills for your cardiac medications for a 90 day supply  Labwork: BMET, CBC, TSH  Testing/Procedures: Your physician has requested that you have an echocardiogram. Echocardiography is a painless test that uses sound waves to create images of your heart. It provides your doctor with information about the size and shape of your heart and how well your heart's chambers and valves are working. This procedure takes approximately one hour. There are no restrictions for this procedure.  Date & Time:_____________________________  Follow-Up: 6 months  Echocardiogram An echocardiogram, or echocardiography, uses sound waves (ultrasound) to produce an image of your heart. The echocardiogram is simple, painless, obtained within a short period of time, and offers valuable information to your health care provider. The images from an echocardiogram can provide information such as:  Evidence of coronary artery disease (CAD).  Heart size.  Heart muscle function.  Heart valve function.  Aneurysm detection.  Evidence of a past heart attack.  Fluid buildup around the heart.  Heart muscle thickening.  Assess heart valve function. LET [facility] CARE PROVIDER KNOW ABOUT:  Any allergies you have.  All medicines you are taking, including vitamins, herbs, eye drops, creams, and over-the-counter medicines.  Previous problems you or members of your family have had with the use of anesthetics.  Any blood disorders you have.  Previous surgeries you have had.  Medical conditions you have.  Possibility of pregnancy, if this applies. BEFORE THE PROCEDURE  No special preparation is needed. Eat and drink normally.  PROCEDURE   In order to produce an image of your heart, gel will be applied to your chest and a wand-like tool (transducer) will be moved over your chest. The gel will help transmit the sound waves from the transducer. The sound waves will  harmlessly bounce off your heart to allow the heart images to be captured in real-time motion. These images will then be recorded.  You may need an IV to receive a medicine that improves the quality of the pictures. AFTER THE PROCEDURE You may return to your normal schedule including diet, activities, and medicines, unless your health care provider tells you otherwise.   This information is not intended to replace advice given to you by your health care provider. Make sure you discuss any questions you have with your health care provider.   Document Released: 08/15/2000 Document Revised: 09/08/2014 Document Reviewed: 04/25/2013 Elsevier Interactive Patient Education 2016 Elsevier Inc. Orthostatic Hypotension Orthostatic hypotension is a sudden drop in blood pressure. It happens when you quickly stand up from a seated or lying position. You may feel dizzy or light-headed. This can last for just a few seconds or for up to a few minutes. It is usually not a serious problem. However, if this happens frequently or gets worse, it can be a sign of something more serious. CAUSES  Different things can cause orthostatic hypotension, including:   Loss of body fluids (dehydration).  Medicines that lower blood pressure.  Sudden changes in posture, such as standing up quickly after you have been sitting or lying down.  Taking too much of your medicine. SIGNS AND SYMPTOMS   Light-headedness or dizziness.   Fainting or near-fainting.   A fast heart rate.   Weakness.   Feeling tired (fatigue).  DIAGNOSIS  Your health care provider may do several things to help diagnose your condition and identify the cause. These may include:   Taking a medical history and doing a physical exam.  Checking your blood pressure. Your health care provider will check your blood pressure when you are:  Lying down.  Sitting.  Standing.  Using tilt table testing. In this test, you lie down on a table that  moves from a lying position to a standing position. You will be strapped onto the table. This test monitors your blood pressure and heart rate when you are in different positions. TREATMENT  Treatment will vary depending on the cause. Possible treatments include:   Changing the dosage of your medicines.  Wearing compression stockings on your lower legs.  Standing up slowly after sitting or lying down.  Eating more salt.  Eating frequent, small meals.  In some cases, getting IV fluids.  Taking medicine to enhance fluid retention. HOME CARE INSTRUCTIONS  Only take over-the-counter or prescription medicines as directed by your health care provider.  Follow your health care provider's instructions for changing the dosage of your current medicines.  Do not stop or adjust your medicine on your own.  Stand up slowly after sitting or lying down. This allows your body to adjust to the different position.  Wear compression stockings as directed.  Eat extra salt as directed.  Do not add extra salt to your diet unless directed to by your health care provider.  Eat frequent, small meals.  Avoid standing suddenly after eating.  Avoid hot showers or excessive heat as directed by your health care provider.  Keep all follow-up appointments. SEEK MEDICAL CARE IF:  You continue to feel dizzy or light-headed after standing.  You feel groggy or confused.  You feel cold, clammy, or sick to your stomach (nauseous).  You have blurred vision.  You feel short of breath. SEEK IMMEDIATE MEDICAL CARE IF:   You faint after standing.  You have chest pain.  You have difficulty breathing.   You lose feeling or movement in your arms or legs.   You have slurred speech or difficulty talking, or you are unable to talk.  MAKE SURE YOU:   Understand these instructions.  Will watch your condition.  Will get help right away if you are not doing well or get worse.   This information  is not intended to replace advice given to you by your health care provider. Make sure you discuss any questions you have with your health care provider.   Document Released: 08/08/2002 Document Revised: 08/23/2013 Document Reviewed: 06/10/2013 Elsevier Interactive Patient Education [employer].

## 2015-07-10 ENCOUNTER — Ambulatory Visit: Payer: Self-pay | Admitting: Family Medicine

## 2015-07-10 ENCOUNTER — Ambulatory Visit (INDEPENDENT_AMBULATORY_CARE_PROVIDER_SITE_OTHER): Payer: Medicare Other | Admitting: Family Medicine

## 2015-07-10 ENCOUNTER — Encounter: Payer: Self-pay | Admitting: Family Medicine

## 2015-07-10 VITALS — BP 132/71 | HR 71 | Temp 98.2°F | Resp 19 | Ht 72.0 in | Wt 235.5 lb

## 2015-07-10 DIAGNOSIS — I6523 Occlusion and stenosis of bilateral carotid arteries: Secondary | ICD-10-CM | POA: Diagnosis not present

## 2015-07-10 DIAGNOSIS — M25532 Pain in left wrist: Secondary | ICD-10-CM | POA: Diagnosis not present

## 2015-07-10 DIAGNOSIS — Z794 Long term (current) use of insulin: Secondary | ICD-10-CM

## 2015-07-10 DIAGNOSIS — M25432 Effusion, left wrist: Secondary | ICD-10-CM

## 2015-07-10 DIAGNOSIS — E119 Type 2 diabetes mellitus without complications: Secondary | ICD-10-CM

## 2015-07-10 DIAGNOSIS — E785 Hyperlipidemia, unspecified: Secondary | ICD-10-CM

## 2015-07-10 DIAGNOSIS — I10 Essential (primary) hypertension: Secondary | ICD-10-CM | POA: Diagnosis not present

## 2015-07-10 LAB — GLUCOSE, POCT (MANUAL RESULT ENTRY): POC Glucose: 147 mg/dl — AB (ref 70–99)

## 2015-07-10 LAB — BASIC METABOLIC PANEL
BUN / CREAT RATIO: 13 (ref 10–22)
BUN: 10 mg/dL (ref 8–27)
CHLORIDE: 98 mmol/L (ref 97–106)
CO2: 25 mmol/L (ref 18–29)
Calcium: 9.6 mg/dL (ref 8.6–10.2)
Creatinine, Ser: 0.78 mg/dL (ref 0.76–1.27)
GFR calc Af Amer: 107 mL/min/{1.73_m2} (ref >59)
GFR calc non Af Amer: 93 mL/min/{1.73_m2} (ref >59)
GLUCOSE: 130 mg/dL — AB (ref 65–99)
Potassium: 4.1 mmol/L (ref 3.5–5.2)
SODIUM: 139 mmol/L (ref 136–144)

## 2015-07-10 LAB — POCT GLYCOSYLATED HEMOGLOBIN (HGB A1C): Hemoglobin A1C: 7

## 2015-07-10 LAB — TSH: TSH: 0.873 u[IU]/mL (ref 0.450–4.500)

## 2015-07-10 NOTE — Progress Notes (Signed)
Name: Victor Fox   MRN: [id number]    DOB: [birth year]   Date:07/10/2015       Progress Note  Subjective  Chief Complaint  Chief Complaint  Patient presents with  . Follow-up    DM  . Diabetes  . Hypertension    Diabetes He presents for his follow-up diabetic visit. He has type 2 diabetes mellitus. His disease course has been stable. There are no hypoglycemic associated symptoms. Pertinent negatives for hypoglycemia include no headaches. Associated symptoms include polyuria. Pertinent negatives for diabetes include no chest pain, no fatigue and no polydipsia. There are no hypoglycemic complications. Symptoms are stable. Diabetic complications include a CVA. Current diabetic treatment includes intensive insulin program and diet. His breakfast blood glucose range is generally 140-180 mg/dl. An ACE inhibitor/angiotensin II receptor blocker is being taken. Eye exam is current.  Hypertension This is a chronic problem. The problem is controlled. Pertinent negatives include no chest pain, headaches, palpitations or shortness of breath. Past treatments include ACE inhibitors, calcium channel blockers and diuretics. Hypertensive end-organ damage includes CVA. There is no history of kidney disease or CAD/MI.  Wrist Pain  The pain is present in the left wrist. This is a new problem. The current episode started more than 1 month ago. There has been no history of extremity trauma. The quality of the pain is described as dull. The pain is at a severity of 8/10. The pain is moderate. Associated symptoms include joint swelling and a limited range of motion. Pertinent negatives include no fever, numbness or stiffness. The symptoms are aggravated by activity (putting his hand in his pocket makes the wrist pain worse.). He has tried nothing for the symptoms. Family history does not include gout or rheumatoid arthritis. His past medical history is significant for osteoarthritis (arthritis in his neck and  shoulders.).    Past Medical History  Diagnosis Date  . Hypertension   . Hyperlipidemia   . Heart murmur     a. echo 06/2012: EF >55%, LVH, aortic valve sclerosis  . Diabetes mellitus without complication ([place])   . CVA (cerebral vascular accident) ([place]) 06/2012  . Vitamin D deficiency   . Neuropathy ([place])   . Benign neoplasm of unspecified site   . Tobacco abuse     a. smoked x 25 years  . Arthritis   . Compression fracture     a. L2  . Carotid artery disease ([place])     a. carotid duplex 05/2015: less tahn 39% stenosis bilaterally, stable over the past year, recommend repeat imaging in 2 years (05/2017)    Past Surgical History  Procedure Laterality Date  . Arthrodesis    . Trigger release    . Discectomy    . Back surgery      x 2    Family History  Problem Relation Age of Onset  . Heart disease Brother   . Hypertension Mother     Social History   Social History  . Marital Status: Single    Spouse Name: N/A  . Number of Children: N/A  . Years of Education: N/A   Occupational History  . Not on file.   Social History Main Topics  . Smoking status: Former Smoker -- 1.00 packs/day for 10 years    Types: Cigarettes  . Smokeless tobacco: Not on file  . Alcohol Use: No  . Drug Use: No  . Sexual Activity: Not on file   Other Topics Concern  . Not on file  Social History Narrative     Current outpatient prescriptions:  .  amLODipine (NORVASC) 10 MG tablet, Take 1 tablet (10 mg total) by mouth daily., Disp: 90 tablet, Rfl: 3 .  aspirin 325 MG tablet, Take 325 mg by mouth daily., Disp: , Rfl:  .  atorvastatin (LIPITOR) 20 MG tablet, Take 1 tablet (20 mg total) by mouth daily., Disp: 90 tablet, Rfl: 3 .  enalapril (VASOTEC) 20 MG tablet, Take 1 tablet (20 mg total) by mouth daily., Disp: 90 tablet, Rfl: 3 .  hydrochlorothiazide (HYDRODIURIL) 25 MG tablet, Take 1 tablet (25 mg total) by mouth daily., Disp: 90 tablet, Rfl: 3 .  insulin NPH-regular Human (NOVOLIN  70/30) (70-30) 100 UNIT/ML injection, Inject 40 Units into the skin 2 (two) times daily with a meal., Disp: , Rfl:   No Known Allergies   Review of Systems  Constitutional: Negative for fever, chills and fatigue.  Respiratory: Negative for shortness of breath.   Cardiovascular: Negative for chest pain and palpitations.  Genitourinary: Negative for dysuria.  Musculoskeletal: Positive for joint pain. Negative for stiffness.  Neurological: Negative for numbness and headaches.  Endo/Heme/Allergies: Negative for polydipsia.   Objective  Filed Vitals:   07/10/15 1126  BP: 132/71  Pulse: 71  Temp: 98.2 F (36.8 C)  TempSrc: Oral  Resp: 19  Height: 6' (1.829 m)  Weight: 235 lb 8 oz (106.822 kg)  SpO2: 93%    Physical Exam  Constitutional: He is well-developed, well-nourished, and in no distress.  Cardiovascular: Normal rate, regular rhythm, S1 normal and S2 normal.   Murmur heard.  Crescendo systolic murmur is present with a grade of 2/6  Pulmonary/Chest: Breath sounds normal. No respiratory distress. He has no wheezes.  Abdominal: Soft. Bowel sounds are normal. There is no tenderness.  Musculoskeletal:       Left wrist: He exhibits decreased range of motion, tenderness, bony tenderness and swelling.  Nursing note and vitals reviewed.  Assessment & Plan  1. Essential hypertension BP stable and controlled on present therapy.  2. Controlled type 2 diabetes mellitus without complication, with long-term current use of insulin (HCC) A1c at goal. No change in therapy. - POCT HgB A1C - POCT Glucose (CBG)  3. Pain and swelling of left wrist Suspect osteoarthritis. We will obtain x-ray and lab work for evaluation. - Comprehensive Metabolic Panel (CMET) - DG Wrist Complete Left; Future - CBC with Differential - Sed Rate (ESR)  4. Dyslipidemia  - Lipid Profile - Comprehensive Metabolic Panel (CMET)   Victor Fox A. [facility] 07/10/2015 11:50 AM

## 2015-08-02 ENCOUNTER — Other Ambulatory Visit: Payer: Self-pay

## 2015-08-02 ENCOUNTER — Ambulatory Visit (INDEPENDENT_AMBULATORY_CARE_PROVIDER_SITE_OTHER): Payer: Medicare Other

## 2015-08-02 DIAGNOSIS — I10 Essential (primary) hypertension: Secondary | ICD-10-CM

## 2015-08-02 DIAGNOSIS — I358 Other nonrheumatic aortic valve disorders: Secondary | ICD-10-CM | POA: Diagnosis not present

## 2015-08-02 DIAGNOSIS — R011 Cardiac murmur, unspecified: Secondary | ICD-10-CM

## 2015-08-07 DIAGNOSIS — E119 Type 2 diabetes mellitus without complications: Secondary | ICD-10-CM | POA: Diagnosis not present

## 2015-08-07 DIAGNOSIS — H40003 Preglaucoma, unspecified, bilateral: Secondary | ICD-10-CM | POA: Diagnosis not present

## 2015-10-18 ENCOUNTER — Other Ambulatory Visit: Payer: Self-pay | Admitting: Family Medicine

## 2015-10-18 NOTE — Telephone Encounter (Signed)
Appointment made for 10-29-15

## 2015-10-18 NOTE — Telephone Encounter (Signed)
Patient requesting refill. 

## 2015-10-29 ENCOUNTER — Ambulatory Visit: Payer: Medicare Other | Admitting: Family Medicine

## 2015-11-07 ENCOUNTER — Encounter: Payer: Self-pay | Admitting: Family Medicine

## 2015-11-07 ENCOUNTER — Ambulatory Visit (INDEPENDENT_AMBULATORY_CARE_PROVIDER_SITE_OTHER): Payer: PPO | Admitting: Family Medicine

## 2015-11-07 ENCOUNTER — Other Ambulatory Visit
Admission: RE | Admit: 2015-11-07 | Discharge: 2015-11-07 | Disposition: A | Discharge status: Home / Self Care | Payer: PPO | Source: Ambulatory Visit | Attending: Family Medicine | Admitting: Family Medicine

## 2015-11-07 VITALS — BP 132/68 | HR 75 | Temp 97.9°F | Resp 17 | Ht 72.0 in | Wt 234.6 lb

## 2015-11-07 DIAGNOSIS — IMO0001 Reserved for inherently not codable concepts without codable children: Secondary | ICD-10-CM

## 2015-11-07 DIAGNOSIS — E1165 Type 2 diabetes mellitus with hyperglycemia: Secondary | ICD-10-CM | POA: Insufficient documentation

## 2015-11-07 DIAGNOSIS — Z794 Long term (current) use of insulin: Secondary | ICD-10-CM | POA: Insufficient documentation

## 2015-11-07 DIAGNOSIS — I10 Essential (primary) hypertension: Secondary | ICD-10-CM

## 2015-11-07 DIAGNOSIS — E785 Hyperlipidemia, unspecified: Secondary | ICD-10-CM | POA: Diagnosis not present

## 2015-11-07 LAB — COMPREHENSIVE METABOLIC PANEL
ALK PHOS: 86 U/L (ref 38–126)
ALT: 18 U/L (ref 17–63)
ANION GAP: 7 (ref 5–15)
AST: 20 U/L (ref 15–41)
Albumin: 4.1 g/dL (ref 3.5–5.0)
BILIRUBIN TOTAL: 1.2 mg/dL (ref 0.3–1.2)
BUN: 11 mg/dL (ref 6–20)
CALCIUM: 9.4 mg/dL (ref 8.9–10.3)
CO2: 30 mmol/L (ref 22–32)
Chloride: 103 mmol/L (ref 101–111)
Creatinine, Ser: 0.94 mg/dL (ref 0.61–1.24)
GFR calc Af Amer: >60 mL/min (ref >60)
GLUCOSE: 162 mg/dL — AB (ref 65–99)
POTASSIUM: 4 mmol/L (ref 3.5–5.1)
Sodium: 140 mmol/L (ref 135–145)
TOTAL PROTEIN: 6.9 g/dL (ref 6.5–8.1)

## 2015-11-07 LAB — LIPID PANEL
CHOL/HDL RATIO: 4 ratio
CHOLESTEROL: 147 mg/dL (ref 0–200)
HDL: 37 mg/dL — AB (ref >40)
LDL Cholesterol: 92 mg/dL (ref 0–99)
TRIGLYCERIDES: 90 mg/dL (ref <150)
VLDL: 18 mg/dL (ref 0–40)

## 2015-11-07 LAB — GLUCOSE, POCT (MANUAL RESULT ENTRY): POC GLUCOSE: 134 mg/dL — AB (ref 70–99)

## 2015-11-07 LAB — POCT GLYCOSYLATED HEMOGLOBIN (HGB A1C): Hemoglobin A1C: 7.9

## 2015-11-07 MED ORDER — ATORVASTATIN CALCIUM 20 MG PO TABS: 20.0000 mg | ORAL_TABLET | Freq: Every day | ORAL | Status: DC

## 2015-11-07 MED ORDER — ENALAPRIL MALEATE 20 MG PO TABS: 20.0000 mg | ORAL_TABLET | Freq: Every day | ORAL | Status: DC

## 2015-11-07 MED ORDER — HYDROCHLOROTHIAZIDE 25 MG PO TABS: 25.0000 mg | ORAL_TABLET | Freq: Every day | ORAL | Status: DC

## 2015-11-07 MED ORDER — AMLODIPINE BESYLATE 10 MG PO TABS: 10.0000 mg | ORAL_TABLET | Freq: Every day | ORAL | Status: DC

## 2015-11-07 MED ORDER — SITAGLIPTIN PHOSPHATE 100 MG PO TABS: 100.0000 mg | ORAL_TABLET | Freq: Every day | ORAL | Status: DC

## 2015-11-07 NOTE — Progress Notes (Signed)
Name: Victor Fox   MRN: [id number]    DOB: July 30, 1946   Date:11/07/2015       Progress Note  Subjective  Chief Complaint  Chief Complaint  Patient presents with  . Medication Refill    Diabetes He presents for his follow-up diabetic visit. He has type 2 diabetes mellitus. There are no hypoglycemic associated symptoms. Pertinent negatives for hypoglycemia include no headaches. Associated symptoms include polyuria. Pertinent negatives for diabetes include no chest pain, no fatigue, no foot paresthesias and no polydipsia. Diabetic complications include a CVA. Current diabetic treatment includes intensive insulin program. He is following a diabetic diet. He rarely participates in exercise. His breakfast blood glucose range is generally 130-140 mg/dl. An ACE inhibitor/angiotensin II receptor blocker is being taken.  Hypertension This is a chronic problem. The problem is controlled. Pertinent negatives include no chest pain, headaches, palpitations or shortness of breath. Past treatments include ACE inhibitors, diuretics and calcium channel blockers. Hypertensive end-organ damage includes CVA. There is no history of kidney disease or CAD/MI.  Hyperlipidemia This is a chronic problem. Pertinent negatives include no chest pain, leg pain, myalgias or shortness of breath. Current antihyperlipidemic treatment includes statins.    Past Medical History  Diagnosis Date  . Hypertension   . Hyperlipidemia   . Heart murmur     a. echo 06/2012: EF >55%, LVH, aortic valve sclerosis  . Diabetes mellitus without complication ([place])   . CVA (cerebral vascular accident) ([place]) 06/2012  . Vitamin D deficiency   . Neuropathy ([place])   . Benign neoplasm of unspecified site   . Tobacco abuse     a. smoked x 25 years  . Arthritis   . Compression fracture     a. L2  . Carotid artery disease ([place])     a. carotid duplex 05/2015: less tahn 39% stenosis bilaterally, stable over the past year, recommend repeat imaging  in 2 years (05/2017)    Past Surgical History  Procedure Laterality Date  . Arthrodesis    . Trigger release    . Discectomy    . Back surgery      x 2    Family History  Problem Relation Age of Onset  . Heart disease Brother   . Hypertension Mother     Social History   Social History  . Marital Status: Single    Spouse Name: N/A  . Number of Children: N/A  . Years of Education: N/A   Occupational History  . Not on file.   Social History Main Topics  . Smoking status: Former Smoker -- 1.00 packs/day for 10 years    Types: Cigarettes  . Smokeless tobacco: Not on file  . Alcohol Use: No  . Drug Use: No  . Sexual Activity: Not on file   Other Topics Concern  . Not on file   Social History Narrative     Current outpatient prescriptions:  .  amLODipine (NORVASC) 10 MG tablet, Take 1 tablet (10 mg total) by mouth daily., Disp: 90 tablet, Rfl: 3 .  aspirin 325 MG tablet, Take 325 mg by mouth daily., Disp: , Rfl:  .  atorvastatin (LIPITOR) 20 MG tablet, Take 1 tablet (20 mg total) by mouth daily., Disp: 90 tablet, Rfl: 3 .  B-D INS SYR ULTRAFINE 1CC/31G 31G X 5/16" 1 ML MISC, use 1 SYRINGE twice a day, Disp: , Rfl: 0 .  Blood Glucose Monitoring Suppl (FREESTYLE PRECISION NEO SYSTEM) w/Device KIT, , Disp: , Rfl:  .  diclofenac (VOLTAREN) 75 MG EC tablet, , Disp: , Rfl:  .  enalapril (VASOTEC) 20 MG tablet, Take 1 tablet (20 mg total) by mouth daily., Disp: 90 tablet, Rfl: 3 .  FREESTYLE PRECISION NEO TEST test strip, , Disp: , Rfl:  .  hydrochlorothiazide (HYDRODIURIL) 25 MG tablet, Take 1 tablet (25 mg total) by mouth daily., Disp: 90 tablet, Rfl: 3 .  insulin NPH-regular Human (NOVOLIN 70/30) (70-30) 100 UNIT/ML injection, Inject 40 Units into the skin 2 (two) times daily with a meal., Disp: , Rfl:  .  Lancets (FREESTYLE) lancets, , Disp: , Rfl:   No Known Allergies   Review of Systems  Constitutional: Negative for fatigue.  Respiratory: Negative for shortness of  breath.   Cardiovascular: Negative for chest pain and palpitations.  Musculoskeletal: Negative for myalgias.  Neurological: Negative for headaches.  Endo/Heme/Allergies: Negative for polydipsia.     Objective  Filed Vitals:   11/07/15 1102  BP: 132/68  Pulse: 75  Temp: 97.9 F (36.6 C)  TempSrc: Oral  Resp: 17  Height: 6' (1.829 m)  Weight: 234 lb 9.6 oz (106.414 kg)  SpO2: 96%    Physical Exam  Constitutional: He is oriented to person, place, and time and well-developed, well-nourished, and in no distress.  HENT:  Head: Normocephalic and atraumatic.  Cardiovascular: Normal rate and regular rhythm.   Pulmonary/Chest: Effort normal and breath sounds normal.  Abdominal: Soft. Bowel sounds are normal.  Neurological: He is alert and oriented to person, place, and time.  Nursing note and vitals reviewed.      Assessment & Plan  1. Essential hypertension Blood pressure stable on antihypertensive regimen. - amLODipine (NORVASC) 10 MG tablet; Take 1 tablet (10 mg total) by mouth daily.  Dispense: 90 tablet; Refill: 3 - enalapril (VASOTEC) 20 MG tablet; Take 1 tablet (20 mg total) by mouth daily.  Dispense: 90 tablet; Refill: 3 - hydrochlorothiazide (HYDRODIURIL) 25 MG tablet; Take 1 tablet (25 mg total) by mouth daily.  Dispense: 90 tablet; Refill: 3  2. Uncontrolled type 2 diabetes mellitus without complication, with long-term current use of insulin (HCC) A1c above goal at 7.9%, discussed switching to basal insulin.for now, we'll add Januvia 100 mg daily. Continue on Novolin. Recheck in 3 months. - sitaGLIPtin (JANUVIA) 100 MG tablet; Take 1 tablet (100 mg total) by mouth daily.  Dispense: 30 tablet; Refill: 0 - Urine Microalbumin w/creat. ratio  3. Dyslipidemia  - Lipid Profile - Comprehensive Metabolic Panel (CMET) - atorvastatin (LIPITOR) 20 MG tablet; Take 1 tablet (20 mg total) by mouth daily.  Dispense: 90 tablet; Refill: 3   Jacque Byron Asad A[facility] 11/07/2015 11:09 AM

## 2015-11-08 LAB — MICROALBUMIN / CREATININE URINE RATIO
Creatinine, Urine: 132.6 mg/dL
MICROALB UR: 33.9 ug/mL — AB
MICROALB/CREAT RATIO: 25.6 mg/g{creat} (ref 0.0–30.0)

## 2015-11-09 ENCOUNTER — Encounter: Payer: Self-pay | Admitting: *Deleted

## 2015-11-16 ENCOUNTER — Telehealth: Payer: Self-pay | Admitting: Family Medicine

## 2015-11-16 MED ORDER — FREESTYLE PRECISION NEO TEST VI STRP: ORAL_STRIP | Status: DC

## 2015-11-16 NOTE — Telephone Encounter (Signed)
Test strips have been refilled and sent to [facility]

## 2015-11-16 NOTE — Telephone Encounter (Signed)
Pt needs test strips for his machine. He thinks it is [facility]. [facility] [address] in [place]

## 2015-12-13 ENCOUNTER — Other Ambulatory Visit: Payer: Self-pay | Admitting: Family Medicine

## 2016-02-21 ENCOUNTER — Other Ambulatory Visit: Payer: Self-pay | Admitting: Family Medicine

## 2016-03-13 ENCOUNTER — Encounter: Payer: Self-pay | Admitting: Family Medicine

## 2016-03-13 ENCOUNTER — Ambulatory Visit (INDEPENDENT_AMBULATORY_CARE_PROVIDER_SITE_OTHER): Payer: PPO | Admitting: Family Medicine

## 2016-03-13 DIAGNOSIS — E1165 Type 2 diabetes mellitus with hyperglycemia: Secondary | ICD-10-CM | POA: Diagnosis not present

## 2016-03-13 DIAGNOSIS — E785 Hyperlipidemia, unspecified: Secondary | ICD-10-CM

## 2016-03-13 DIAGNOSIS — I10 Essential (primary) hypertension: Secondary | ICD-10-CM

## 2016-03-13 DIAGNOSIS — IMO0001 Reserved for inherently not codable concepts without codable children: Secondary | ICD-10-CM

## 2016-03-13 DIAGNOSIS — Z794 Long term (current) use of insulin: Secondary | ICD-10-CM | POA: Diagnosis not present

## 2016-03-13 LAB — POCT GLYCOSYLATED HEMOGLOBIN (HGB A1C): HEMOGLOBIN A1C: 7.8

## 2016-03-13 LAB — GLUCOSE, POCT (MANUAL RESULT ENTRY): POC GLUCOSE: 130 mg/dL — AB (ref 70–99)

## 2016-03-13 MED ORDER — ATORVASTATIN CALCIUM 20 MG PO TABS
20.0000 mg | ORAL_TABLET | Freq: Every day | ORAL | Status: DC
Start: 2016-03-13 — End: 2016-12-11

## 2016-03-13 MED ORDER — SITAGLIPTIN PHOSPHATE 100 MG PO TABS: 100.0000 mg | ORAL_TABLET | Freq: Every day | ORAL | Status: DC

## 2016-03-13 MED ORDER — INSULIN DEGLUDEC 100 UNIT/ML ~~LOC~~ SOPN: 10.0000 [IU] | PEN_INJECTOR | Freq: Every day | SUBCUTANEOUS | Status: DC

## 2016-03-13 NOTE — Progress Notes (Signed)
Name: Victor Fox   MRN: [id number]    DOB: 1946-03-23   Date:03/13/2016       Progress Note  Subjective  Chief Complaint  Chief Complaint  Patient presents with  . Diabetes    medication refills   . Hyperlipidemia    Diabetes He presents for his follow-up diabetic visit. He has type 2 diabetes mellitus. His disease course has been worsening. Hypoglycemia symptoms include nervousness/anxiousness and sweats. Pertinent negatives for hypoglycemia include no headaches. (BG dropped too low, the meter said 'low', felt nervous, sweaty, and shaky) Associated symptoms include polyuria. Pertinent negatives for diabetes include no blurred vision, no chest pain, no fatigue and no polydipsia. There are no hypoglycemic complications. Diabetic complications include a CVA (Stroke in 2013). Current diabetic treatment includes oral agent (monotherapy) and intensive insulin program. He has not had a previous visit with a dietitian. His breakfast blood glucose range is generally 110-130 mg/dl. An ACE inhibitor/angiotensin II receptor blocker is being taken. Eye exam is current.  Hyperlipidemia This is a chronic problem. The problem is controlled. Recent lipid tests were reviewed and are normal (HDL below normal.). Pertinent negatives include no chest pain, leg pain or myalgias. Current antihyperlipidemic treatment includes statins.  Hypertension This is a chronic problem. The problem is unchanged. The problem is controlled. Associated symptoms include sweats. Pertinent negatives include no blurred vision, chest pain, headaches or palpitations. Past treatments include calcium channel blockers, diuretics and ACE inhibitors. Hypertensive end-organ damage includes CVA (Stroke in 2013). There is no history of kidney disease or CAD/MI.       Past Medical History  Diagnosis Date  . Hypertension   . Hyperlipidemia   . Heart murmur     a. echo 06/2012: EF >55%, LVH, aortic valve sclerosis  . Diabetes mellitus  without complication ([place])   . CVA (cerebral vascular accident) ([place]) 06/2012  . Vitamin D deficiency   . Neuropathy ([place])   . Benign neoplasm of unspecified site   . Tobacco abuse     a. smoked x 25 years  . Arthritis   . Compression fracture     a. L2  . Carotid artery disease ([place])     a. carotid duplex 05/2015: less tahn 39% stenosis bilaterally, stable over the past year, recommend repeat imaging in 2 years (05/2017)    Past Surgical History  Procedure Laterality Date  . Arthrodesis    . Trigger release    . Discectomy    . Back surgery      x 2    Family History  Problem Relation Age of Onset  . Heart disease Brother   . Hypertension Mother     Social History   Social History  . Marital Status: Single    Spouse Name: N/A  . Number of Children: N/A  . Years of Education: N/A   Occupational History  . Not on file.   Social History Main Topics  . Smoking status: Former Smoker -- 1.00 packs/day for 10 years    Types: Cigarettes  . Smokeless tobacco: Not on file  . Alcohol Use: No  . Drug Use: No  . Sexual Activity: Not on file   Other Topics Concern  . Not on file   Social History Narrative     Current outpatient prescriptions:  .  amLODipine (NORVASC) 10 MG tablet, Take 1 tablet (10 mg total) by mouth daily., Disp: 90 tablet, Rfl: 3 .  aspirin 325 MG tablet, Take 325 mg by mouth  daily., Disp: , Rfl:  .  atorvastatin (LIPITOR) 20 MG tablet, Take 1 tablet (20 mg total) by mouth daily., Disp: 90 tablet, Rfl: 3 .  B-D INS SYR ULTRAFINE 1CC/31G 31G X 5/16" 1 ML MISC, use 1 SYRINGE twice a day, Disp: , Rfl: 0 .  Blood Glucose Monitoring Suppl (FREESTYLE PRECISION NEO SYSTEM) w/Device KIT, , Disp: , Rfl:  .  diclofenac (VOLTAREN) 75 MG EC tablet, , Disp: , Rfl:  .  enalapril (VASOTEC) 20 MG tablet, Take 1 tablet (20 mg total) by mouth daily., Disp: 90 tablet, Rfl: 3 .  FREESTYLE PRECISION NEO TEST test strip, Use as Directed to check Blood Glucose daily, Disp:  100 each, Rfl: 0 .  hydrochlorothiazide (HYDRODIURIL) 25 MG tablet, Take 1 tablet (25 mg total) by mouth daily., Disp: 90 tablet, Rfl: 3 .  insulin NPH-regular Human (NOVOLIN 70/30) (70-30) 100 UNIT/ML injection, Inject 40 Units into the skin 2 (two) times daily with a meal., Disp: , Rfl:  .  JANUVIA 100 MG tablet, take 1 tablet by mouth once daily, Disp: 30 tablet, Rfl: 0 .  Lancets (FREESTYLE) lancets, , Disp: , Rfl:   No Known Allergies   Review of Systems  Constitutional: Negative for fatigue.  Eyes: Negative for blurred vision.  Cardiovascular: Negative for chest pain and palpitations.  Musculoskeletal: Negative for myalgias.  Neurological: Negative for headaches.  Endo/Heme/Allergies: Negative for polydipsia.  Psychiatric/Behavioral: The patient is nervous/anxious.     Objective  Filed Vitals:   03/13/16 1018  BP: 142/76  Pulse: 82  Temp: 97.7 F (36.5 C)  TempSrc: Oral  Resp: 18  Height: 6' (1.829 m)  Weight: 246 lb (111.585 kg)  SpO2: 93%    Physical Exam  Constitutional: He is oriented to person, place, and time and well-developed, well-nourished, and in no distress.  Cardiovascular: Normal rate, S1 normal and S2 normal.  Exam reveals no distant heart sounds.   Murmur (grade 2/6 systolic blowing murmur, best heard at Left Sternal border.) heard.  Systolic murmur is present with a grade of 2/6  Pulmonary/Chest: Effort normal and breath sounds normal. No respiratory distress. He has no wheezes. He has no rhonchi.  Abdominal: Soft. Bowel sounds are normal. There is no tenderness.  Musculoskeletal:       Right ankle: He exhibits swelling.       Left ankle: He exhibits swelling.  Left leg 2+ pitting edema, right leg 1+ pitting edema  Neurological: He is alert and oriented to person, place, and time.  Psychiatric: Mood, memory, affect and judgment normal.  Nursing note and vitals reviewed.      Assessment & Plan  1. Dyslipidemia FLP at goal, refills for  atorvastatin provided - atorvastatin (LIPITOR) 20 MG tablet; Take 1 tablet (20 mg total) by mouth daily.  Dispense: 90 tablet; Refill: 3  2. Uncontrolled type 2 diabetes mellitus without complication, with long-term current use of insulin (HCC) DC NPH 70-30 because of hypoglycemic episodes and the fact that A1c is not at goal. We'll start on Tresiba at 10 units every night, advised to check blood glucose regularly, we will review labs in one month. - POCT HgB A1C - POCT Glucose (CBG) - Insulin Degludec (TRESIBA FLEXTOUCH) 100 UNIT/ML SOPN; Inject 10 Units into the skin at bedtime.  Dispense: 1 pen; Refill: 3 - sitaGLIPtin (JANUVIA) 100 MG tablet; Take 1 tablet (100 mg total) by mouth daily.  Dispense: 90 tablet; Refill: 0  3. Essential hypertension BP at goal. Continue current   therapy    Ocia Simek Asad A[facility] 03/13/2016 10:33 AM

## 2016-03-14 ENCOUNTER — Telehealth: Payer: Self-pay | Admitting: Emergency Medicine

## 2016-03-14 NOTE — Telephone Encounter (Signed)
Patient notified of lab results

## 2016-04-01 ENCOUNTER — Telehealth: Payer: Self-pay | Admitting: Family Medicine

## 2016-04-14 ENCOUNTER — Ambulatory Visit (INDEPENDENT_AMBULATORY_CARE_PROVIDER_SITE_OTHER): Payer: PPO | Admitting: Family Medicine

## 2016-04-14 ENCOUNTER — Encounter: Payer: Self-pay | Admitting: Family Medicine

## 2016-04-14 VITALS — BP 140/70 | HR 80 | Temp 98.2°F | Resp 18 | Ht 72.0 in | Wt 245.9 lb

## 2016-04-14 DIAGNOSIS — Z794 Long term (current) use of insulin: Secondary | ICD-10-CM

## 2016-04-14 DIAGNOSIS — E785 Hyperlipidemia, unspecified: Secondary | ICD-10-CM | POA: Diagnosis not present

## 2016-04-14 DIAGNOSIS — E1165 Type 2 diabetes mellitus with hyperglycemia: Secondary | ICD-10-CM

## 2016-04-14 DIAGNOSIS — IMO0001 Reserved for inherently not codable concepts without codable children: Secondary | ICD-10-CM

## 2016-04-14 DIAGNOSIS — I10 Essential (primary) hypertension: Secondary | ICD-10-CM | POA: Diagnosis not present

## 2016-04-14 MED ORDER — HYDROCHLOROTHIAZIDE 25 MG PO TABS
25.0000 mg | ORAL_TABLET | Freq: Every day | ORAL | 1 refills | Status: DC
Start: 2016-04-14 — End: 2016-12-11

## 2016-04-14 MED ORDER — ENALAPRIL MALEATE 20 MG PO TABS
20.0000 mg | ORAL_TABLET | Freq: Every day | ORAL | 1 refills | Status: DC
Start: 2016-04-14 — End: 2016-12-11

## 2016-04-14 NOTE — Progress Notes (Signed)
Name: Victor Fox   MRN: [id number]    DOB: [birth year]   Date:04/14/2016       Progress Note  Subjective  Chief Complaint  Chief Complaint  Patient presents with  . Follow-up    1 mo    HPI  Diabetes Mellitus Follow up: Pt. Presents for Diabetes Follow up, his last A1c was 7.8 % in July, was started on Tresiba 10 units daily(switched from NPH 70-30). He reports that he started with Tresiba 10 units and went up as high as 40 units but his blood glucose was staying elevated in the high 200s-low 300s. He went back on NPH 70-30, his blood glucose now back into 120s.    Past Medical History:  Diagnosis Date  . Arthritis   . Benign neoplasm of unspecified site   . Carotid artery disease ([place])    a. carotid duplex 05/2015: less tahn 39% stenosis bilaterally, stable over the past year, recommend repeat imaging in 2 years (05/2017)  . Compression fracture    a. L2  . CVA (cerebral vascular accident) ([place]) 06/2012  . Diabetes mellitus without complication ([place])   . Heart murmur    a. echo 06/2012: EF >55%, LVH, aortic valve sclerosis  . Hyperlipidemia   . Hypertension   . Neuropathy ([place])   . Tobacco abuse    a. smoked x 25 years  . Vitamin D deficiency     Past Surgical History:  Procedure Laterality Date  . ARTHRODESIS    . BACK SURGERY     x 2  . discectomy    . trigger release      Family History  Problem Relation Age of Onset  . Heart disease Brother   . Hypertension Mother     Social History   Social History  . Marital status: Single    Spouse name: N/A  . Number of children: N/A  . Years of education: N/A   Occupational History  . Not on file.   Social History Main Topics  . Smoking status: Former Smoker    Packs/day: 1.00    Years: 10.00    Types: Cigarettes  . Smokeless tobacco: Never Used  . Alcohol use No  . Drug use: No  . Sexual activity: Not on file   Other Topics Concern  . Not on file   Social History Narrative  . No narrative on  file     Current Outpatient Prescriptions:  .  amLODipine (NORVASC) 10 MG tablet, Take 1 tablet (10 mg total) by mouth daily., Disp: 90 tablet, Rfl: 3 .  aspirin 325 MG tablet, Take 325 mg by mouth daily., Disp: , Rfl:  .  atorvastatin (LIPITOR) 20 MG tablet, Take 1 tablet (20 mg total) by mouth daily., Disp: 90 tablet, Rfl: 3 .  B-D INS SYR ULTRAFINE 1CC/31G 31G X 5/16" 1 ML MISC, use 1 SYRINGE twice a day, Disp: , Rfl: 0 .  Blood Glucose Monitoring Suppl (FREESTYLE PRECISION NEO SYSTEM) w/Device KIT, , Disp: , Rfl:  .  diclofenac (VOLTAREN) 75 MG EC tablet, , Disp: , Rfl:  .  enalapril (VASOTEC) 20 MG tablet, Take 1 tablet (20 mg total) by mouth daily., Disp: 90 tablet, Rfl: 3 .  FREESTYLE PRECISION NEO TEST test strip, Use as Directed to check Blood Glucose daily, Disp: 100 each, Rfl: 0 .  hydrochlorothiazide (HYDRODIURIL) 25 MG tablet, Take 1 tablet (25 mg total) by mouth daily., Disp: 90 tablet, Rfl: 3 .  Insulin Degludec (TRESIBA  FLEXTOUCH) 100 UNIT/ML SOPN, Inject 10 Units into the skin at bedtime., Disp: 1 pen, Rfl: 3 .  insulin NPH-regular Human (NOVOLIN 70/30) (70-30) 100 UNIT/ML injection, Inject 40 Units into the skin 2 (two) times daily with a meal., Disp: , Rfl:  .  Lancets (FREESTYLE) lancets, , Disp: , Rfl:  .  sitaGLIPtin (JANUVIA) 100 MG tablet, Take 1 tablet (100 mg total) by mouth daily., Disp: 90 tablet, Rfl: 0  No Known Allergies   Review of Systems  Constitutional: Negative for chills and malaise/fatigue.  Eyes: Negative for blurred vision.  Cardiovascular: Negative for chest pain and leg swelling.  Gastrointestinal: Negative for abdominal pain.  Genitourinary: Negative for frequency.  Musculoskeletal: Negative for myalgias.  Neurological: Negative for dizziness and headaches.      Objective  Vitals:   04/14/16 1048  BP: 140/70  Pulse: 80  Resp: 18  Temp: 98.2 F (36.8 C)  TempSrc: Oral  SpO2: 93%  Weight: 245 lb 14.4 oz (111.5 kg)  Height: 6'  (1.829 m)    Physical Exam  Constitutional: He is oriented to person, place, and time and well-developed, well-nourished, and in no distress.  HENT:  Head: Normocephalic and atraumatic.  Eyes: Conjunctivae are normal. Pupils are equal, round, and reactive to light.  Cardiovascular: Normal rate, regular rhythm and normal heart sounds.   No murmur heard. Pulmonary/Chest: Effort normal and breath sounds normal. No respiratory distress. He has no wheezes.  Musculoskeletal: Normal range of motion. He exhibits edema.  Neurological: He is alert and oriented to person, place, and time.  Psychiatric: Mood, memory, affect and judgment normal.  Nursing note and vitals reviewed.      Recent Results (from the past 2160 hour(s))  POCT HgB A1C     Status: None   Collection Time: 03/13/16 10:35 AM  Result Value Ref Range   Hemoglobin A1C 7.8   POCT Glucose (CBG)     Status: Abnormal   Collection Time: 03/13/16 11:08 AM  Result Value Ref Range   POC Glucose 130 (A) 70 - 99 mg/dl     Assessment & Plan  1. Essential hypertension BP stable on present antihypertensive therapy - enalapril (VASOTEC) 20 MG tablet; Take 1 tablet (20 mg total) by mouth daily.  Dispense: 90 tablet; Refill: 1 - hydrochlorothiazide (HYDRODIURIL) 25 MG tablet; Take 1 tablet (25 mg total) by mouth daily.  Dispense: 90 tablet; Refill: 1  2. Uncontrolled type 2 diabetes mellitus without complication, with long-term current use of insulin ([place]) Now back on NPH, recheck A1c in 8 weeks  3. Dyslipidemia At goal, continue statin therapy - Lipid Profile   Wayde Gopaul Asad A[facility] 04/14/2016 10:59 AM

## 2016-04-15 NOTE — Telephone Encounter (Signed)
COMPLETED

## 2016-06-12 ENCOUNTER — Telehealth: Payer: Self-pay | Admitting: Cardiovascular Disease

## 2016-06-12 NOTE — Telephone Encounter (Signed)
3 attempts to schedule fu from recall list.  Deleting recall.  °

## 2016-06-13 ENCOUNTER — Ambulatory Visit: Payer: PPO | Admitting: Family Medicine

## 2016-06-16 ENCOUNTER — Ambulatory Visit (INDEPENDENT_AMBULATORY_CARE_PROVIDER_SITE_OTHER): Payer: PPO | Admitting: Family Medicine

## 2016-06-16 ENCOUNTER — Encounter: Payer: Self-pay | Admitting: Family Medicine

## 2016-06-16 VITALS — BP 144/68 | HR 74 | Temp 98.0°F | Resp 18 | Ht 72.0 in | Wt 252.7 lb

## 2016-06-16 DIAGNOSIS — Z794 Long term (current) use of insulin: Secondary | ICD-10-CM | POA: Diagnosis not present

## 2016-06-16 DIAGNOSIS — Z23 Encounter for immunization: Secondary | ICD-10-CM | POA: Diagnosis not present

## 2016-06-16 DIAGNOSIS — IMO0001 Reserved for inherently not codable concepts without codable children: Secondary | ICD-10-CM

## 2016-06-16 DIAGNOSIS — E785 Hyperlipidemia, unspecified: Secondary | ICD-10-CM

## 2016-06-16 DIAGNOSIS — E1165 Type 2 diabetes mellitus with hyperglycemia: Secondary | ICD-10-CM

## 2016-06-16 DIAGNOSIS — I10 Essential (primary) hypertension: Secondary | ICD-10-CM | POA: Diagnosis not present

## 2016-06-16 LAB — POCT GLYCOSYLATED HEMOGLOBIN (HGB A1C): Hemoglobin A1C: 8

## 2016-06-16 MED ORDER — "INSULIN SYRINGE 31G X 5/16"" 0.3 ML MISC": 2 refills | Status: DC

## 2016-06-16 MED ORDER — INSULIN NPH ISOPHANE & REGULAR (70-30) 100 UNIT/ML ~~LOC~~ SUSP: 45.0000 [IU] | Freq: Two times a day (BID) | SUBCUTANEOUS | 2 refills | Status: DC

## 2016-06-16 NOTE — Progress Notes (Signed)
Name: Victor Fox   MRN: [id number]    DOB: March 04, 1946   Date:06/16/2016       Progress Note  Subjective  Chief Complaint  Chief Complaint  Patient presents with  . Diabetes    follow up  . Hypertension  . Hyperlipidemia    Diabetes  He presents for his follow-up diabetic visit. He has type 2 diabetes mellitus. His disease course has been worsening. There are no hypoglycemic associated symptoms. Pertinent negatives for hypoglycemia include no headaches. (BG dropped too low, the meter said 'low', felt nervous, sweaty, and shaky) Associated symptoms include polyuria. Pertinent negatives for diabetes include no blurred vision, no chest pain, no fatigue, no polydipsia and no weight loss. There are no hypoglycemic complications. Diabetic complications include a CVA (Stroke in 2013). Risk factors for coronary artery disease include diabetes mellitus. Current diabetic treatment includes oral agent (monotherapy) and intensive insulin program. His weight is stable. He is following a generally unhealthy (drinking sodas, eats chicken tenders, biscuit and gravy in the morning.) diet. He has not had a previous visit with a dietitian. He monitors blood glucose at home 1-2 x per day. His breakfast blood glucose range is generally >200 mg/dl. An ACE inhibitor/angiotensin II receptor blocker is being taken. Eye exam is current.  Hyperlipidemia  This is a chronic problem. The problem is controlled. Recent lipid tests were reviewed and are high (Goal LDL < 70). Pertinent negatives include no chest pain, leg pain or myalgias. Current antihyperlipidemic treatment includes statins.  Hypertension  This is a chronic problem. The problem is unchanged. The problem is controlled. Pertinent negatives include no blurred vision, chest pain, headaches, malaise/fatigue or palpitations. Past treatments include calcium channel blockers, diuretics and ACE inhibitors. Hypertensive end-organ damage includes CVA (Stroke in 2013).  There is no history of kidney disease or CAD/MI.    Past Medical History:  Diagnosis Date  . Arthritis   . Benign neoplasm of unspecified site   . Carotid artery disease ([place])    a. carotid duplex 05/2015: less tahn 39% stenosis bilaterally, stable over the past year, recommend repeat imaging in 2 years (05/2017)  . Compression fracture    a. L2  . CVA (cerebral vascular accident) ([place]) 06/2012  . Diabetes mellitus without complication ([place])   . Heart murmur    a. echo 06/2012: EF >55%, LVH, aortic valve sclerosis  . Hyperlipidemia   . Hypertension   . Neuropathy ([place])   . Tobacco abuse    a. smoked x 25 years  . Vitamin D deficiency     Past Surgical History:  Procedure Laterality Date  . ARTHRODESIS    . BACK SURGERY     x 2  . discectomy    . trigger release      Family History  Problem Relation Age of Onset  . Heart disease Brother   . Hypertension Mother     Social History   Social History  . Marital status: Single    Spouse name: N/A  . Number of children: N/A  . Years of education: N/A   Occupational History  . Not on file.   Social History Main Topics  . Smoking status: Former Smoker    Packs/day: 1.00    Years: 10.00    Types: Cigarettes  . Smokeless tobacco: Never Used  . Alcohol use No  . Drug use: No  . Sexual activity: Not on file   Other Topics Concern  . Not on file   Social  History Narrative  . No narrative on file     Current Outpatient Prescriptions:  .  amLODipine (NORVASC) 10 MG tablet, Take 1 tablet (10 mg total) by mouth daily., Disp: 90 tablet, Rfl: 3 .  aspirin 325 MG tablet, Take 325 mg by mouth daily., Disp: , Rfl:  .  atorvastatin (LIPITOR) 20 MG tablet, Take 1 tablet (20 mg total) by mouth daily., Disp: 90 tablet, Rfl: 3 .  B-D INS SYR ULTRAFINE 1CC/31G 31G X 5/16" 1 ML MISC, use 1 SYRINGE twice a day, Disp: , Rfl: 0 .  Blood Glucose Monitoring Suppl (FREESTYLE PRECISION NEO SYSTEM) w/Device KIT, , Disp: , Rfl:  .   enalapril (VASOTEC) 20 MG tablet, Take 1 tablet (20 mg total) by mouth daily., Disp: 90 tablet, Rfl: 1 .  FREESTYLE PRECISION NEO TEST test strip, Use as Directed to check Blood Glucose daily, Disp: 100 each, Rfl: 0 .  hydrochlorothiazide (HYDRODIURIL) 25 MG tablet, Take 1 tablet (25 mg total) by mouth daily., Disp: 90 tablet, Rfl: 1 .  insulin NPH-regular Human (NOVOLIN 70/30) (70-30) 100 UNIT/ML injection, Inject 40 Units into the skin 2 (two) times daily with a meal., Disp: , Rfl:  .  Lancets (FREESTYLE) lancets, , Disp: , Rfl:  .  sitaGLIPtin (JANUVIA) 100 MG tablet, Take 1 tablet (100 mg total) by mouth daily., Disp: 90 tablet, Rfl: 0 .  diclofenac (VOLTAREN) 75 MG EC tablet, , Disp: , Rfl:   No Known Allergies   Review of Systems  Constitutional: Negative for chills, fatigue, fever, malaise/fatigue and weight loss.  Eyes: Negative for blurred vision.  Cardiovascular: Negative for chest pain and palpitations.  Gastrointestinal: Negative for abdominal pain.  Musculoskeletal: Negative for myalgias.  Neurological: Negative for headaches.  Endo/Heme/Allergies: Negative for polydipsia.    Objective  Vitals:   06/16/16 1439  BP: (!) 144/68  Pulse: 74  Resp: 18  Temp: 98 F (36.7 C)  TempSrc: Oral  SpO2: 96%  Weight: 252 lb 11.2 oz (114.6 kg)  Height: 6' (1.829 m)    Physical Exam  Constitutional: He is oriented to person, place, and time and well-developed, well-nourished, and in no distress.  HENT:  Head: Normocephalic and atraumatic.  Cardiovascular: Normal rate, regular rhythm, S1 normal and S2 normal.   Murmur heard.  Systolic murmur is present with a grade of 2/6  Pulmonary/Chest: Effort normal and breath sounds normal. He has no wheezes.  Abdominal: Soft. Bowel sounds are normal. There is no tenderness.  Musculoskeletal:       Right ankle: He exhibits swelling.       Left ankle: He exhibits swelling.  Neurological: He is alert and oriented to person, place, and  time.  Skin: Skin is warm and dry.  Psychiatric: Mood, memory, affect and judgment normal.  Nursing note and vitals reviewed.    Recent Results (from the past 2160 hour(s))  POCT HgB A1C     Status: None   Collection Time: 06/16/16  2:57 PM  Result Value Ref Range   Hemoglobin A1C 8.0      Assessment & Plan  1. Uncontrolled type 2 diabetes mellitus without complication, with long-term current use of insulin (HCC) Point-of-care A1c is 8.0%, consistent with poorly controlled diabetes. We increase insulin to 45 units twice daily, encouraged dietary and lifestyle modifications including avoiding concentrated sweets and increase physical activity to lose weight. - POCT HgB A1C - insulin NPH-regular Human (NOVOLIN 70/30) (70-30) 100 UNIT/ML injection; Inject 45 Units into the skin  2 (two) times daily with a meal.  Dispense: 10 mL; Refill: 2 - Insulin Syringe-Needle U-100 (INSULIN SYRINGE .3CC/31GX5/16") 31G X 5/16" 0.3 ML MISC; Use as  Directed with Insulin.  Dispense: 100 each; Refill: 2  2. Dyslipidemia  - Lipid Profile - COMPLETE METABOLIC PANEL WITH GFR  3. Essential hypertension BP elevated, reassess in 3 months.  4. Need for influenza vaccination  - Flu vaccine HIGH DOSE PF (Fluzone High dose)   Syed Asad A. [facility] 06/16/2016 2:58 PM

## 2016-06-17 LAB — COMPLETE METABOLIC PANEL WITH GFR
ALT: 18 U/L (ref 9–46)
AST: 19 U/L (ref 10–35)
Albumin: 4.1 g/dL (ref 3.6–5.1)
Alkaline Phosphatase: 86 U/L (ref 40–115)
BILIRUBIN TOTAL: 0.7 mg/dL (ref 0.2–1.2)
BUN: 13 mg/dL (ref 7–25)
CHLORIDE: 102 mmol/L (ref 98–110)
CO2: 29 mmol/L (ref 20–31)
CREATININE: 1.02 mg/dL (ref 0.70–1.25)
Calcium: 9.7 mg/dL (ref 8.6–10.3)
GFR, Est African American: 86 mL/min (ref >=60)
GFR, Est Non African American: 75 mL/min (ref >=60)
GLUCOSE: 91 mg/dL (ref 65–99)
Potassium: 4.1 mmol/L (ref 3.5–5.3)
SODIUM: 140 mmol/L (ref 135–146)
TOTAL PROTEIN: 6.6 g/dL (ref 6.1–8.1)

## 2016-06-17 LAB — LIPID PANEL
Cholesterol: 137 mg/dL (ref 125–200)
HDL: 38 mg/dL — ABNORMAL LOW (ref >=40)
LDL CALC: 73 mg/dL (ref <130)
Total CHOL/HDL Ratio: 3.6 Ratio (ref <=5.0)
Triglycerides: 130 mg/dL (ref <150)
VLDL: 26 mg/dL (ref <30)

## 2016-07-28 ENCOUNTER — Telehealth: Payer: Self-pay | Admitting: Family Medicine

## 2016-07-28 NOTE — Telephone Encounter (Signed)
Called Pt to schedule AWV with [facility] - knb °

## 2016-07-29 ENCOUNTER — Ambulatory Visit: Payer: PPO

## 2016-07-29 ENCOUNTER — Telehealth: Payer: Self-pay | Admitting: Cardiovascular Disease

## 2016-07-29 DIAGNOSIS — I6529 Occlusion and stenosis of unspecified carotid artery: Secondary | ICD-10-CM

## 2016-07-29 DIAGNOSIS — I6523 Occlusion and stenosis of bilateral carotid arteries: Secondary | ICD-10-CM | POA: Diagnosis not present

## 2016-07-29 NOTE — Telephone Encounter (Signed)
Pt sched to see Dr. Rockey Situ 11/30 @ 9:40.

## 2016-07-29 NOTE — Telephone Encounter (Signed)
Patient in office for carotid u/s @ 11:30  .  C/o of pain under arm/ breast Left side to center of chest .  Currently in waiting room.    Pt c/o of Chest Pain: STAT if CP now or developed within 24 hours  1. Are you having CP right now? No   2. Are you experiencing any other symptoms (ex. SOB, nausea, vomiting, sweating)?     3. How long have you been experiencing CP? 1 month   4. Is your CP continuous or coming and going? Comes and goes nothing makes worse or better has been told he had a hernia and is not sure if feeling is bc of this    5. Have you taken Nitroglycerin? No  ?

## 2016-07-29 NOTE — Telephone Encounter (Signed)
Yes.  We have 12/7 at 3:20 pm. I will confirm with him this is ok.

## 2016-07-29 NOTE — Telephone Encounter (Signed)
Pt is overdue for 1 year f/u. Can you set him up for an appt for eval before he leaves?

## 2016-07-31 ENCOUNTER — Ambulatory Visit (INDEPENDENT_AMBULATORY_CARE_PROVIDER_SITE_OTHER): Payer: PPO | Admitting: Cardiovascular Disease

## 2016-07-31 ENCOUNTER — Encounter: Payer: Self-pay | Admitting: Cardiovascular Disease

## 2016-07-31 VITALS — BP 145/60 | HR 54 | Ht 72.0 in | Wt 253.5 lb

## 2016-07-31 DIAGNOSIS — I639 Cerebral infarction, unspecified: Secondary | ICD-10-CM | POA: Diagnosis not present

## 2016-07-31 DIAGNOSIS — E782 Mixed hyperlipidemia: Secondary | ICD-10-CM | POA: Diagnosis not present

## 2016-07-31 DIAGNOSIS — I779 Disorder of arteries and arterioles, unspecified: Secondary | ICD-10-CM

## 2016-07-31 DIAGNOSIS — Z794 Long term (current) use of insulin: Secondary | ICD-10-CM

## 2016-07-31 DIAGNOSIS — I5032 Chronic diastolic (congestive) heart failure: Secondary | ICD-10-CM | POA: Diagnosis not present

## 2016-07-31 DIAGNOSIS — IMO0001 Reserved for inherently not codable concepts without codable children: Secondary | ICD-10-CM

## 2016-07-31 DIAGNOSIS — I739 Peripheral vascular disease, unspecified: Secondary | ICD-10-CM

## 2016-07-31 DIAGNOSIS — E1165 Type 2 diabetes mellitus with hyperglycemia: Secondary | ICD-10-CM

## 2016-07-31 DIAGNOSIS — I10 Essential (primary) hypertension: Secondary | ICD-10-CM | POA: Diagnosis not present

## 2016-07-31 MED ORDER — POTASSIUM CHLORIDE CRYS ER 20 MEQ PO TBCR: 20.0000 meq | EXTENDED_RELEASE_TABLET | Freq: Every day | ORAL | 11 refills | Status: DC | PRN

## 2016-07-31 MED ORDER — FUROSEMIDE 20 MG PO TABS
20.0000 mg | ORAL_TABLET | Freq: Every day | ORAL | 11 refills | Status: DC | PRN
Start: 2016-07-31 — End: 2017-08-27
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
## 2016-07-31 NOTE — Patient Instructions (Addendum)
Medication Instructions:   Please take lasix three times a week with potassium pill for leg swelling  Low carb diet  Weigh daily   Labwork:  No new labs needed  Testing/Procedures:  No further testing at this time   I recommend watching educational videos on topics of interest to you at:       [email or web address]  Enter code: [email or web address]    Follow-Up: It was a pleasure seeing you in the office today. Please call us if you have new issues that need to be addressed before your next appt.  [phone]  Your physician wants you to follow-up in: 6 months.  You will receive a reminder letter in the mail two months in advance. If you don't receive a letter, please call our office to schedule the follow-up appointment.  If you need a refill on your cardiac medications before your next appointment, please call your pharmacy.

## 2016-08-05 NOTE — Addendum Note (Signed)
Addended by: Anselm Pancoast on: 08/05/2016 02:18 PM   Modules accepted: Orders

## 2016-08-07 ENCOUNTER — Ambulatory Visit: Payer: PPO | Admitting: Cardiovascular Disease

## 2016-09-08 ENCOUNTER — Telehealth: Payer: Self-pay | Admitting: Family Medicine

## 2016-09-08 NOTE — Telephone Encounter (Signed)
Called Pt to schedule AWV with [facility] - knb °

## 2016-09-24 ENCOUNTER — Other Ambulatory Visit: Payer: Self-pay | Admitting: Family Medicine

## 2016-09-24 DIAGNOSIS — E1165 Type 2 diabetes mellitus with hyperglycemia: Principal | ICD-10-CM

## 2016-09-24 DIAGNOSIS — Z794 Long term (current) use of insulin: Principal | ICD-10-CM

## 2016-09-24 DIAGNOSIS — IMO0001 Reserved for inherently not codable concepts without codable children: Secondary | ICD-10-CM

## 2016-11-04 ENCOUNTER — Other Ambulatory Visit: Payer: Self-pay | Admitting: Cardiovascular Disease

## 2016-11-04 DIAGNOSIS — I350 Nonrheumatic aortic (valve) stenosis: Secondary | ICD-10-CM

## 2016-11-18 ENCOUNTER — Other Ambulatory Visit: Payer: PPO

## 2016-11-20 ENCOUNTER — Other Ambulatory Visit: Payer: Self-pay | Admitting: Family Medicine

## 2016-11-20 DIAGNOSIS — I10 Essential (primary) hypertension: Secondary | ICD-10-CM

## 2016-12-09 ENCOUNTER — Ambulatory Visit: Payer: PPO | Admitting: Family Medicine

## 2016-12-11 ENCOUNTER — Encounter: Payer: Self-pay | Admitting: Family Medicine

## 2016-12-11 ENCOUNTER — Ambulatory Visit (INDEPENDENT_AMBULATORY_CARE_PROVIDER_SITE_OTHER): Payer: PPO | Admitting: Family Medicine

## 2016-12-11 VITALS — BP 140/71 | HR 73 | Temp 98.3°F | Resp 16 | Ht 72.0 in | Wt 247.9 lb

## 2016-12-11 DIAGNOSIS — IMO0001 Reserved for inherently not codable concepts without codable children: Secondary | ICD-10-CM

## 2016-12-11 DIAGNOSIS — E1165 Type 2 diabetes mellitus with hyperglycemia: Secondary | ICD-10-CM

## 2016-12-11 DIAGNOSIS — I10 Essential (primary) hypertension: Secondary | ICD-10-CM

## 2016-12-11 DIAGNOSIS — E785 Hyperlipidemia, unspecified: Secondary | ICD-10-CM | POA: Diagnosis not present

## 2016-12-11 DIAGNOSIS — Z794 Long term (current) use of insulin: Secondary | ICD-10-CM | POA: Diagnosis not present

## 2016-12-11 LAB — POCT GLYCOSYLATED HEMOGLOBIN (HGB A1C): Hemoglobin A1C: 8.4

## 2016-12-11 LAB — GLUCOSE, POCT (MANUAL RESULT ENTRY): POC GLUCOSE: 337 mg/dL — AB (ref 70–99)

## 2016-12-11 MED ORDER — HYDROCHLOROTHIAZIDE 25 MG PO TABS: 25.0000 mg | ORAL_TABLET | Freq: Every day | ORAL | 1 refills | Status: DC

## 2016-12-11 MED ORDER — ENALAPRIL MALEATE 20 MG PO TABS: 20.0000 mg | ORAL_TABLET | Freq: Every day | ORAL | 1 refills | Status: DC

## 2016-12-11 MED ORDER — ATORVASTATIN CALCIUM 20 MG PO TABS: 20.0000 mg | ORAL_TABLET | Freq: Every day | ORAL | 3 refills | Status: DC

## 2016-12-11 MED ORDER — INSULIN DEGLUDEC-LIRAGLUTIDE 100-3.6 UNIT-MG/ML ~~LOC~~ SOPN: 16.0000 [IU] | PEN_INJECTOR | Freq: Every day | SUBCUTANEOUS | 2 refills | Status: DC

## 2016-12-11 MED ORDER — AMLODIPINE BESYLATE 10 MG PO TABS: 10.0000 mg | ORAL_TABLET | Freq: Every day | ORAL | 3 refills | Status: DC

## 2016-12-11 MED ORDER — SITAGLIPTIN PHOSPHATE 100 MG PO TABS
100.0000 mg | ORAL_TABLET | Freq: Every day | ORAL | 0 refills | Status: DC
Start: 2016-12-11 — End: 2017-03-30

## 2016-12-11 NOTE — Progress Notes (Signed)
Name: Victor Fox   MRN: [id number]    DOB: September 16, 1945   Date:12/11/2016       Progress Note  Subjective  Chief Complaint  Chief Complaint  Patient presents with  . Medication Refill    Diabetes  He presents for his follow-up diabetic visit. He has type 2 diabetes mellitus. His disease course has been worsening. Hypoglycemia symptoms include nervousness/anxiousness and tremors. Pertinent negatives for hypoglycemia include no headaches or sweats. Associated symptoms include polyuria. Pertinent negatives for diabetes include no blurred vision, no chest pain, no fatigue, no polydipsia and no weight loss. There are no hypoglycemic complications. Diabetic complications include a CVA (Stroke in 2013). Risk factors for coronary artery disease include diabetes mellitus. Current diabetic treatment includes oral agent (monotherapy) and intensive insulin program. His weight is stable. He is following a generally unhealthy (hamburgers, fish, not much bread, diet cokes.) diet. He has not had a previous visit with a dietitian. He monitors blood glucose at home 1-2 x per day. His breakfast blood glucose range is generally >200 mg/dl. An ACE inhibitor/angiotensin II receptor blocker is being taken. Eye exam is current.  Hyperlipidemia  This is a chronic problem. The problem is controlled. Recent lipid tests were reviewed and are high (Goal LDL < 70). Exacerbating diseases include diabetes. Pertinent negatives include no chest pain, leg pain, myalgias or shortness of breath. Current antihyperlipidemic treatment includes statins.  Hypertension  This is a chronic problem. The problem is unchanged. The problem is controlled. Pertinent negatives include no blurred vision, chest pain, headaches, malaise/fatigue, palpitations, shortness of breath or sweats. Past treatments include calcium channel blockers, diuretics and ACE inhibitors. Hypertensive end-organ damage includes CVA (Stroke in 2013). There is no history of  kidney disease or CAD/MI.    Past Medical History:  Diagnosis Date  . Arthritis   . Benign neoplasm of unspecified site   . Carotid artery disease ([place])    a. carotid duplex 05/2015: less tahn 39% stenosis bilaterally, stable over the past year, recommend repeat imaging in 2 years (05/2017)  . Compression fracture    a. L2  . CVA (cerebral vascular accident) ([place]) 06/2012  . Diabetes mellitus without complication ([place])   . Heart murmur    a. echo 06/2012: EF >55%, LVH, aortic valve sclerosis  . Hyperlipidemia   . Hypertension   . Neuropathy ([place])   . Tobacco abuse    a. smoked x 25 years  . Vitamin D deficiency     Past Surgical History:  Procedure Laterality Date  . ARTHRODESIS    . BACK SURGERY     x 2  . discectomy    . trigger release      Family History  Problem Relation Age of Onset  . Heart disease Brother   . Hypertension Mother     Social History   Social History  . Marital status: Single    Spouse name: N/A  . Number of children: N/A  . Years of education: N/A   Occupational History  . Not on file.   Social History Main Topics  . Smoking status: Former Smoker    Packs/day: 1.00    Years: 10.00    Types: Cigarettes  . Smokeless tobacco: Never Used  . Alcohol use No  . Drug use: No  . Sexual activity: Not on file   Other Topics Concern  . Not on file   Social History Narrative  . No narrative on file     Current Outpatient  Prescriptions:  .  amLODipine (NORVASC) 10 MG tablet, Take 1 tablet (10 mg total) by mouth daily., Disp: 90 tablet, Rfl: 3 .  aspirin 325 MG tablet, Take 325 mg by mouth daily., Disp: , Rfl:  .  atorvastatin (LIPITOR) 20 MG tablet, Take 1 tablet (20 mg total) by mouth daily., Disp: 90 tablet, Rfl: 3 .  B-D INS SYR ULTRAFINE 1CC/31G 31G X 5/16" 1 ML MISC, use 1 SYRINGE twice a day, Disp: , Rfl: 0 .  Blood Glucose Monitoring Suppl (FREESTYLE PRECISION NEO SYSTEM) w/Device KIT, , Disp: , Rfl:  .  diclofenac (VOLTAREN) 75  MG EC tablet, , Disp: , Rfl:  .  enalapril (VASOTEC) 20 MG tablet, Take 1 tablet (20 mg total) by mouth daily., Disp: 90 tablet, Rfl: 1 .  FREESTYLE PRECISION NEO TEST test strip, Use as Directed to check Blood Glucose daily, Disp: 100 each, Rfl: 0 .  hydrochlorothiazide (HYDRODIURIL) 25 MG tablet, Take 1 tablet (25 mg total) by mouth daily., Disp: 90 tablet, Rfl: 1 .  insulin NPH-regular Human (NOVOLIN 70/30) (70-30) 100 UNIT/ML injection, Inject 45 Units into the skin 2 (two) times daily with a meal., Disp: 10 mL, Rfl: 2 .  Insulin Syringe-Needle U-100 (INSULIN SYRINGE .3CC/31GX5/16") 31G X 5/16" 0.3 ML MISC, Use as  Directed with Insulin., Disp: 100 each, Rfl: 2 .  Lancets (FREESTYLE) lancets, , Disp: , Rfl:  .  potassium chloride SA (K-DUR,KLOR-CON) 20 MEQ tablet, Take 1 tablet (20 mEq total) by mouth daily as needed., Disp: 30 tablet, Rfl: 11 .  sitaGLIPtin (JANUVIA) 100 MG tablet, Take 1 tablet (100 mg total) by mouth daily., Disp: 90 tablet, Rfl: 0 .  furosemide (LASIX) 20 MG tablet, Take 1 tablet (20 mg total) by mouth daily as needed., Disp: 30 tablet, Rfl: 11  No Known Allergies   Review of Systems  Constitutional: Negative for fatigue, malaise/fatigue and weight loss.  Eyes: Negative for blurred vision.  Respiratory: Negative for shortness of breath.   Cardiovascular: Negative for chest pain and palpitations.  Musculoskeletal: Negative for myalgias.  Neurological: Positive for tremors. Negative for headaches.  Endo/Heme/Allergies: Negative for polydipsia.  Psychiatric/Behavioral: The patient is nervous/anxious.       Objective  Vitals:   12/11/16 1052  BP: 140/71  Pulse: 73  Resp: 16  Temp: 98.3 F (36.8 C)  TempSrc: Oral  SpO2: 94%  Weight: 247 lb 14.4 oz (112.4 kg)  Height: 6' (1.829 m)    Physical Exam  Constitutional: He is well-developed, well-nourished, and in no distress.  Cardiovascular: Normal rate, regular rhythm, S1 normal and S2 normal.   Murmur  heard.  Systolic murmur is present with a grade of 2/6  Pulmonary/Chest: Effort normal and breath sounds normal. No respiratory distress. He has no wheezes. He has no rhonchi.  Abdominal: Soft. Bowel sounds are normal. There is no tenderness.  Musculoskeletal:       Right ankle: He exhibits swelling.       Left ankle: He exhibits swelling.  1+ pitting edema bilateral lower extremities.  Psychiatric: Mood, memory, affect and judgment normal.  Nursing note and vitals reviewed.    Recent Results (from the past 2160 hour(s))  POCT Glucose (CBG)     Status: Abnormal   Collection Time: 12/11/16 10:59 AM  Result Value Ref Range   POC Glucose 337 (A) 70 - 99 mg/dl  POCT HgB A1C     Status: Abnormal   Collection Time: 12/11/16 11:12 AM  Result Value Ref  Range   Hemoglobin A1C 8.4      Assessment & Plan  1. Uncontrolled type 2 diabetes mellitus without complication, with long-term current use of insulin (HCC) Point-of-care A1c is 8.4%, DC'd Novolin and start on Xultophy 16 units daily, may go up by 2 units every 4 days, advised to check BG regularly and bring logs for review, continue on Januvia - POCT HgB A1C - POCT Glucose (CBG) - Insulin Degludec-Liraglutide (XULTOPHY) 100-3.6 UNIT-MG/ML SOPN; Inject 16 Units into the skin daily.  Dispense: 3 pen; Refill: 2 - sitaGLIPtin (JANUVIA) 100 MG tablet; Take 1 tablet (100 mg total) by mouth daily.  Dispense: 90 tablet; Refill: 0  2. Dyslipidemia  - atorvastatin (LIPITOR) 20 MG tablet; Take 1 tablet (20 mg total) by mouth daily.  Dispense: 90 tablet; Refill: 3 - Lipid panel  3. Essential hypertension  - amLODipine (NORVASC) 10 MG tablet; Take 1 tablet (10 mg total) by mouth daily.  Dispense: 90 tablet; Refill: 3 - enalapril (VASOTEC) 20 MG tablet; Take 1 tablet (20 mg total) by mouth daily.  Dispense: 90 tablet; Refill: 1 - hydrochlorothiazide (HYDRODIURIL) 25 MG tablet; Take 1 tablet (25 mg total) by mouth daily.  Dispense: 90 tablet;  Refill: 1   Stiles Maxcy [facility] 12/11/2016 11:16 AM

## 2016-12-23 ENCOUNTER — Ambulatory Visit (INDEPENDENT_AMBULATORY_CARE_PROVIDER_SITE_OTHER): Payer: PPO

## 2016-12-23 ENCOUNTER — Other Ambulatory Visit: Payer: Self-pay

## 2016-12-23 DIAGNOSIS — I350 Nonrheumatic aortic (valve) stenosis: Secondary | ICD-10-CM

## 2017-01-28 ENCOUNTER — Other Ambulatory Visit: Payer: Self-pay | Admitting: Family Medicine

## 2017-01-30 ENCOUNTER — Telehealth: Payer: Self-pay | Admitting: Family Medicine

## 2017-03-02 ENCOUNTER — Telehealth: Payer: Self-pay | Admitting: Family Medicine

## 2017-03-02 NOTE — Telephone Encounter (Signed)
completed

## 2017-03-12 ENCOUNTER — Ambulatory Visit: Payer: PPO | Admitting: Family Medicine

## 2017-03-19 ENCOUNTER — Ambulatory Visit: Payer: PPO | Admitting: Family Medicine

## 2017-03-30 ENCOUNTER — Encounter: Payer: Self-pay | Admitting: Family Medicine

## 2017-03-30 ENCOUNTER — Ambulatory Visit (INDEPENDENT_AMBULATORY_CARE_PROVIDER_SITE_OTHER): Payer: PPO | Admitting: Family Medicine

## 2017-03-30 VITALS — BP 137/71 | HR 69 | Temp 97.4°F | Resp 16 | Ht 72.0 in | Wt 232.7 lb

## 2017-03-30 DIAGNOSIS — E1165 Type 2 diabetes mellitus with hyperglycemia: Secondary | ICD-10-CM

## 2017-03-30 DIAGNOSIS — E785 Hyperlipidemia, unspecified: Secondary | ICD-10-CM | POA: Diagnosis not present

## 2017-03-30 DIAGNOSIS — I10 Essential (primary) hypertension: Secondary | ICD-10-CM

## 2017-03-30 DIAGNOSIS — IMO0001 Reserved for inherently not codable concepts without codable children: Secondary | ICD-10-CM

## 2017-03-30 DIAGNOSIS — Z794 Long term (current) use of insulin: Secondary | ICD-10-CM

## 2017-03-30 LAB — POCT GLYCOSYLATED HEMOGLOBIN (HGB A1C): HEMOGLOBIN A1C: 7.8

## 2017-03-30 LAB — GLUCOSE, POCT (MANUAL RESULT ENTRY): POC Glucose: 217 mg/dl — AB (ref 70–99)

## 2017-03-30 MED ORDER — HYDROCHLOROTHIAZIDE 25 MG PO TABS: 25.0000 mg | ORAL_TABLET | Freq: Every day | ORAL | 1 refills | Status: DC

## 2017-03-30 MED ORDER — ENALAPRIL MALEATE 20 MG PO TABS: 20.0000 mg | ORAL_TABLET | Freq: Every day | ORAL | 1 refills | Status: DC

## 2017-03-30 MED ORDER — METFORMIN HCL 500 MG PO TABS: 500.0000 mg | ORAL_TABLET | Freq: Two times a day (BID) | ORAL | 0 refills | Status: DC

## 2017-03-30 NOTE — Progress Notes (Signed)
Name: Victor Fox   MRN: [id number]    DOB: Dec 28, 1945   Date:03/30/2017       Progress Note  Subjective  Chief Complaint  Chief Complaint  Patient presents with  . Follow-up    3 mo  . Medication Refill  . Diabetes  . Hyperlipidemia    Diabetes  He presents for his follow-up diabetic visit. He has type 2 diabetes mellitus. His disease course has been stable. There are no hypoglycemic associated symptoms. Pertinent negatives for hypoglycemia include no headaches, nervousness/anxiousness or sweats. Associated symptoms include polyuria. Pertinent negatives for diabetes include no blurred vision, no chest pain, no fatigue and no polydipsia. There are no hypoglycemic complications. Diabetic complications include a CVA. Pertinent negatives for diabetic complications include no heart disease or peripheral neuropathy. Current diabetic treatment includes insulin injections, intensive insulin program and oral agent (monotherapy). He monitors blood glucose at home 1-2 x per day. His breakfast blood glucose range is generally 130-140 mg/dl. His dinner blood glucose range is generally 180-200 mg/dl.  Hyperlipidemia  This is a chronic problem. The problem is controlled. Recent lipid tests were reviewed and are normal. Exacerbating diseases include diabetes. Pertinent negatives include no chest pain, leg pain, myalgias or shortness of breath. Current antihyperlipidemic treatment includes statins.  Hypertension  This is a chronic problem. The problem is unchanged. The problem is controlled. Pertinent negatives include no blurred vision, chest pain, headaches, palpitations, shortness of breath or sweats. Past treatments include calcium channel blockers, diuretics and ACE inhibitors. Hypertensive end-organ damage includes CVA. There is no history of kidney disease or CAD/MI.     Past Medical History:  Diagnosis Date  . Arthritis   . Benign neoplasm of unspecified site   . Carotid artery disease ([place])    a. carotid duplex 05/2015: less tahn 39% stenosis bilaterally, stable over the past year, recommend repeat imaging in 2 years (05/2017)  . Compression fracture    a. L2  . CVA (cerebral vascular accident) ([place]) 06/2012  . Diabetes mellitus without complication ([place])   . Heart murmur    a. echo 06/2012: EF >55%, LVH, aortic valve sclerosis  . Hyperlipidemia   . Hypertension   . Neuropathy   . Tobacco abuse    a. smoked x 25 years  . Vitamin D deficiency     Past Surgical History:  Procedure Laterality Date  . ARTHRODESIS    . BACK SURGERY     x 2  . discectomy    . trigger release      Family History  Problem Relation Age of Onset  . Heart disease Brother   . Hypertension Mother     Social History   Social History  . Marital status: Single    Spouse name: N/A  . Number of children: N/A  . Years of education: N/A   Occupational History  . Not on file.   Social History Main Topics  . Smoking status: Former Smoker    Packs/day: 1.00    Years: 10.00    Types: Cigarettes  . Smokeless tobacco: Never Used  . Alcohol use No  . Drug use: No  . Sexual activity: Not on file   Other Topics Concern  . Not on file   Social History Narrative  . No narrative on file     Current Outpatient Prescriptions:  .  amLODipine (NORVASC) 10 MG tablet, Take 1 tablet (10 mg total) by mouth daily., Disp: 90 tablet, Rfl: 3 .  aspirin 325  MG tablet, Take 325 mg by mouth daily., Disp: , Rfl:  .  atorvastatin (LIPITOR) 20 MG tablet, Take 1 tablet (20 mg total) by mouth daily., Disp: 90 tablet, Rfl: 3 .  B-D INS SYR ULTRAFINE 1CC/31G 31G X 5/16" 1 ML MISC, use 1 SYRINGE twice a day, Disp: , Rfl: 0 .  B-D UF III MINI PEN NEEDLES 31G X 5 MM MISC, use once daily, Disp: 100 each, Rfl: PRN .  Blood Glucose Monitoring Suppl (FREESTYLE PRECISION NEO SYSTEM) w/Device KIT, , Disp: , Rfl:  .  diclofenac (VOLTAREN) 75 MG EC tablet, , Disp: , Rfl:  .  enalapril (VASOTEC) 20 MG tablet, Take 1 tablet  (20 mg total) by mouth daily., Disp: 90 tablet, Rfl: 1 .  FREESTYLE PRECISION NEO TEST test strip, Use as Directed to check Blood Glucose daily, Disp: 100 each, Rfl: 0 .  hydrochlorothiazide (HYDRODIURIL) 25 MG tablet, Take 1 tablet (25 mg total) by mouth daily., Disp: 90 tablet, Rfl: 1 .  Insulin Degludec-Liraglutide (XULTOPHY) 100-3.6 UNIT-MG/ML SOPN, Inject 16 Units into the skin daily., Disp: 3 pen, Rfl: 2 .  Lancets (FREESTYLE) lancets, , Disp: , Rfl:  .  potassium chloride SA (K-DUR,KLOR-CON) 20 MEQ tablet, Take 1 tablet (20 mEq total) by mouth daily as needed., Disp: 30 tablet, Rfl: 11 .  sitaGLIPtin (JANUVIA) 100 MG tablet, Take 1 tablet (100 mg total) by mouth daily., Disp: 90 tablet, Rfl: 0 .  furosemide (LASIX) 20 MG tablet, Take 1 tablet (20 mg total) by mouth daily as needed., Disp: 30 tablet, Rfl: 11  No Known Allergies   Review of Systems  Constitutional: Negative for fatigue.  Eyes: Negative for blurred vision.  Respiratory: Negative for shortness of breath.   Cardiovascular: Negative for chest pain and palpitations.  Musculoskeletal: Negative for myalgias.  Neurological: Negative for headaches.  Endo/Heme/Allergies: Negative for polydipsia.  Psychiatric/Behavioral: The patient is not nervous/anxious.       Objective  Vitals:   03/30/17 0944  BP: 137/71  Pulse: 69  Resp: 16  Temp: (!) 97.4 F (36.3 C)  TempSrc: Oral  SpO2: 95%  Weight: 232 lb 11.2 oz (105.6 kg)  Height: 6' (1.829 m)    Physical Exam  Constitutional: He is oriented to person, place, and time and well-developed, well-nourished, and in no distress.  HENT:  Head: Normocephalic and atraumatic.  Cardiovascular: Normal rate, regular rhythm and normal heart sounds.   No murmur heard. Pulmonary/Chest: Effort normal and breath sounds normal. He has no wheezes.  Abdominal: Soft. Bowel sounds are normal. There is no tenderness.  Musculoskeletal: He exhibits edema.  Neurological: He is alert and  oriented to person, place, and time.  Psychiatric: Mood, memory, affect and judgment normal.  Nursing note and vitals reviewed.   Recent Results (from the past 2160 hour(s))  POCT HgB A1C     Status: Abnormal   Collection Time: 03/30/17  9:49 AM  Result Value Ref Range   Hemoglobin A1C 7.8   POCT Glucose (CBG)     Status: Abnormal   Collection Time: 03/30/17  9:49 AM  Result Value Ref Range   POC Glucose 217 (A) 70 - 99 mg/dl     Assessment & Plan  1. Uncontrolled type 2 diabetes mellitus without complication, with long-term current use of insulin (HCC) A1c 7.8%, improved compared to 8.4% from April 2018, we will add metformin 500 mg twice a day to patient's diabetic regimen, continue on Xultophy, recheck in 3 month - POCT HgB  A1C - POCT Glucose (CBG) - metFORMIN (GLUCOPHAGE) 500 MG tablet; Take 1 tablet (500 mg total) by mouth 2 (two) times daily with a meal.  Dispense: 180 tablet; Refill: 0  2. Dyslipidemia Obtain FLP, continue on statin - Lipid panel - COMPLETE METABOLIC PANEL WITH GFR  3. Essential hypertension BP stable on present anti-hypertensive treatment - hydrochlorothiazide (HYDRODIURIL) 25 MG tablet; Take 1 tablet (25 mg total) by mouth daily.  Dispense: 90 tablet; Refill: 1 - enalapril (VASOTEC) 20 MG tablet; Take 1 tablet (20 mg total) by mouth daily.  Dispense: 90 tablet; Refill: 1   Blannie Shedlock [facility] 03/30/2017 9:59 AM

## 2017-05-18 ENCOUNTER — Ambulatory Visit: Payer: PPO

## 2017-05-19 ENCOUNTER — Encounter: Payer: PPO | Admitting: Family Medicine

## 2017-06-04 ENCOUNTER — Telehealth: Payer: Self-pay | Admitting: Family Medicine

## 2017-07-10 ENCOUNTER — Ambulatory Visit: Payer: PPO

## 2017-07-14 ENCOUNTER — Ambulatory Visit: Payer: PPO

## 2017-08-14 ENCOUNTER — Ambulatory Visit: Payer: PPO

## 2017-08-27 ENCOUNTER — Other Ambulatory Visit: Payer: Self-pay | Admitting: Cardiovascular Disease

## 2017-08-27 ENCOUNTER — Other Ambulatory Visit: Payer: Self-pay

## 2017-08-27 ENCOUNTER — Telehealth: Payer: Self-pay | Admitting: Cardiovascular Disease

## 2017-08-27 MED ORDER — FUROSEMIDE 20 MG PO TABS: ORAL_TABLET | ORAL | 0 refills | Status: DC

## 2017-08-27 NOTE — Telephone Encounter (Signed)
Received refill request from [facility] for pt's lasix. Pt overdue for 6 month follow up from 11/17. S/w pt who scheduled 09/24/17 w/Dr. Rockey Situ. Per November 17 AVS "Medication Instructions:   Please take lasix three times a week with potassium pill for leg swelling  Low carb diet  Weigh daily"  Prescription was not updated when medication change was made. I have updated medication list and sent in lasix 20mg , one tablet three times a week w/no refills to [facility].

## 2017-08-27 NOTE — Telephone Encounter (Signed)
Please advise refill.   Patient was last seen 07/2016 was intrusted to follow up in 6 months and patient never did.    Per Dr. Gwenyth Ober note from 07/2016:  Medication Instructions:   Please take lasix three times a week with potassium pill for leg swelling

## 2017-09-24 ENCOUNTER — Ambulatory Visit: Payer: PPO | Admitting: Cardiovascular Disease

## 2017-10-03 ENCOUNTER — Other Ambulatory Visit: Payer: Self-pay | Admitting: Cardiovascular Disease

## 2017-10-08 ENCOUNTER — Telehealth: Payer: Self-pay | Admitting: Family Medicine

## 2017-10-08 NOTE — Telephone Encounter (Signed)
Copied from [place]. Topic: Quick Communication - See Telephone Encounter >> Oct 08, 2017  3:48 PM Vernona Rieger wrote: CRM for notification. See Telephone encounter for:   10/08/17.  Cover my meds needs a PA on Insulin Degludec-Liraglutide (XULTOPHY) 100-3.6 UNIT-MG/ML SOPN. [phone] (humana)  Call back to cover my meds is [phone] if you need any help

## 2017-10-09 NOTE — Telephone Encounter (Signed)
I returned the call to [facility] and was told that the information we had was incorrect.  I was given the new information for [facility] so that I could reprocess the PA online.  RX BIN [id number] RX PCN #[id number] RX GRP [id number]

## 2017-10-13 NOTE — Telephone Encounter (Signed)
Visit complete.
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
## 2017-10-22 ENCOUNTER — Ambulatory Visit: Payer: PPO | Admitting: Cardiovascular Disease

## 2017-10-23 ENCOUNTER — Encounter: Payer: Self-pay | Admitting: Cardiovascular Disease

## 2017-12-10 ENCOUNTER — Other Ambulatory Visit: Payer: Self-pay

## 2017-12-10 DIAGNOSIS — I10 Essential (primary) hypertension: Secondary | ICD-10-CM

## 2017-12-10 MED ORDER — AMLODIPINE BESYLATE 10 MG PO TABS: 10.0000 mg | ORAL_TABLET | Freq: Every day | ORAL | 0 refills | Status: DC

## 2017-12-10 NOTE — Telephone Encounter (Signed)
Pt has an appt next week He was due to be seen in November I'll allow this refill

## 2017-12-15 ENCOUNTER — Ambulatory Visit: Payer: PPO | Admitting: Family Medicine

## 2017-12-17 ENCOUNTER — Ambulatory Visit (INDEPENDENT_AMBULATORY_CARE_PROVIDER_SITE_OTHER): Payer: Medicare HMO | Admitting: Nurse Practitioner

## 2017-12-17 ENCOUNTER — Encounter: Payer: Self-pay | Admitting: Nurse Practitioner

## 2017-12-17 VITALS — BP 130/76 | HR 78 | Temp 98.2°F | Resp 18 | Ht 72.0 in | Wt 222.8 lb

## 2017-12-17 DIAGNOSIS — E785 Hyperlipidemia, unspecified: Secondary | ICD-10-CM | POA: Diagnosis not present

## 2017-12-17 DIAGNOSIS — R42 Dizziness and giddiness: Secondary | ICD-10-CM | POA: Diagnosis not present

## 2017-12-17 DIAGNOSIS — Z794 Long term (current) use of insulin: Secondary | ICD-10-CM

## 2017-12-17 DIAGNOSIS — E1142 Type 2 diabetes mellitus with diabetic polyneuropathy: Secondary | ICD-10-CM | POA: Insufficient documentation

## 2017-12-17 DIAGNOSIS — E1169 Type 2 diabetes mellitus with other specified complication: Secondary | ICD-10-CM | POA: Insufficient documentation

## 2017-12-17 DIAGNOSIS — Z23 Encounter for immunization: Secondary | ICD-10-CM | POA: Diagnosis not present

## 2017-12-17 DIAGNOSIS — I10 Essential (primary) hypertension: Secondary | ICD-10-CM | POA: Diagnosis not present

## 2017-12-17 DIAGNOSIS — E118 Type 2 diabetes mellitus with unspecified complications: Secondary | ICD-10-CM

## 2017-12-17 DIAGNOSIS — Z1159 Encounter for screening for other viral diseases: Secondary | ICD-10-CM | POA: Diagnosis not present

## 2017-12-17 DIAGNOSIS — E1165 Type 2 diabetes mellitus with hyperglycemia: Secondary | ICD-10-CM | POA: Diagnosis not present

## 2017-12-17 DIAGNOSIS — IMO0001 Reserved for inherently not codable concepts without codable children: Secondary | ICD-10-CM

## 2017-12-17 MED ORDER — HYDROCHLOROTHIAZIDE 25 MG PO TABS: 25.0000 mg | ORAL_TABLET | Freq: Every day | ORAL | 1 refills | Status: DC

## 2017-12-17 MED ORDER — METFORMIN HCL ER 500 MG PO TB24: 500.0000 mg | ORAL_TABLET | Freq: Two times a day (BID) | ORAL | 3 refills | Status: DC

## 2017-12-17 MED ORDER — AMLODIPINE BESYLATE 10 MG PO TABS: 10.0000 mg | ORAL_TABLET | Freq: Every day | ORAL | 0 refills | Status: DC

## 2017-12-17 MED ORDER — ATORVASTATIN CALCIUM 20 MG PO TABS: 20.0000 mg | ORAL_TABLET | Freq: Every day | ORAL | 3 refills | Status: DC

## 2017-12-17 MED ORDER — ENALAPRIL MALEATE 20 MG PO TABS: 20.0000 mg | ORAL_TABLET | Freq: Every day | ORAL | 1 refills | Status: DC

## 2017-12-17 MED ORDER — MECLIZINE HCL 25 MG PO TABS: 25.0000 mg | ORAL_TABLET | Freq: Three times a day (TID) | ORAL | 0 refills | Status: DC | PRN

## 2017-12-17 NOTE — Patient Instructions (Addendum)
-   take tylenol or acetaminophen for joint aches - take meclizine if you are experiencing dizziness ( do not drive) you can take it 8 hours later if still feeling dizzy; use sparingly - Sending you to physical therapy to realign crystals to help with your dizziness - Do no take the lasix anymore    Vertigo Vertigo means that you feel like you are moving when you are not. Vertigo can also make you feel like things around you are moving when they are not. This feeling can come and go at any time. Vertigo often goes away on its own. Follow these instructions at home:  Avoid making fast movements.  Avoid driving.  Avoid using heavy machinery.  Avoid doing any task or activity that might cause danger to you or other people if you would have a vertigo attack while you are doing it.  Sit down right away if you feel dizzy or have trouble with your balance.  Take over-the-counter and prescription medicines only as told by your doctor.  Follow instructions from your doctor about which positions or movements you should avoid.  Drink enough fluid to keep your pee (urine) clear or pale yellow.  Keep all follow-up visits as told by your doctor. This is important. Contact a doctor if:  Medicine does not help your vertigo.  You have a fever.  Your problems get worse or you have new symptoms.  Your family or friends see changes in your behavior.  You feel sick to your stomach (nauseous) or you throw up (vomit).  You have a "pins and needles" feeling or you are numb in part of your body. Get help right away if:  You have trouble moving or talking.  You are always dizzy.  You pass out (faint).  You get very bad headaches.  You feel weak or have trouble using your hands, arms, or legs.  You have changes in your hearing.  You have changes in your seeing (vision).  You get a stiff neck.  Bright light starts to bother you. This information is not intended to replace advice given  to you by your health care provider. Make sure you discuss any questions you have with your health care provider. Document Released: 05/27/2008 Document Revised: 01/24/2016 Document Reviewed: 12/11/2014 Elsevier Interactive Patient Education  [employer].

## 2017-12-17 NOTE — Progress Notes (Addendum)
Name: Victor Fox   MRN: [id number]    DOB: [birth year]   Date:12/17/2017       Progress Note  Subjective  Chief Complaint  Chief Complaint  Patient presents with  . Medication Refill    HPI  Hypertension rx amlodipine, enalapril, HCTZ and lasix- takes medications as prescribed states has missed doses once or twice a week. States forgot to take his medicine this morning because he was in a rush. Patient states doesn't know why he is on lasix- bottle states ordered by Dr. Rockey Situ for leg swelling. States saw Dr. Rockey Situ last seen over a year ago to be evaluated for heart murmur- sts had normal echo.   HLD rx atorvastatin 48m denies myalgias  Diabetes Was on metformin but he ran out. He is taking xultophy 16 units this morning. States he takes 50-60 units of his 70/30 insulin novolin when he feels his sugar is too high.  Checks sugars every morning between 200-350.  Diet: gravy biscuits, scrabled eggs, wendys-chili, lots of fish   CVA/Carotid artery stenosis/CAD Had stroke a few years ago with slurred speech no deficits. Takes 321mASA daily.   Dizziness  Patient endorses dizziness ongoing for a couple of month. Episodes happen daily and lasting a few minutes and self resolves. Episodes caused staggering gait. Episodes happen when he is having position change from laying to standing or standing to laying or moving head side to side. Patient denies headaches, blurry vision, palpitations, slurred speech, chest pain.   Patient Active Problem List   Diagnosis Date Noted  . Hyperlipidemia 04/14/2016  . Pain and swelling of left wrist 07/10/2015  . Dyslipidemia 07/10/2015  . Carotid artery disease ([place]  . Heart murmur 10/03/2013  . HTN (hypertension) 10/03/2013  . CVA (cerebral vascular accident) ([facility]02/10/2013  . Carotid artery stenosis 10/03/2013  . Diabetes mellitus type 2, uncontrolled, without complications (HC[facility] [id number]/60/[id number]  Past Medical History:  Diagnosis Date  .  Arthritis   . Benign neoplasm of unspecified site   . Carotid artery disease ([facility] [place]   a. carotid duplex 05/2015: less tahn 39% stenosis bilaterally, stable over the past year, recommend repeat imaging in 2 years (05/2017)  . Compression fracture    a. L2  . CVA (cerebral vascular accident) ([facility] [place]10/2013  . Diabetes mellitus without complication ([facility] [place]  . Heart murmur    a. echo 06/2012: EF >55%, LVH, aortic valve sclerosis  . Hyperlipidemia   . Hypertension   . Neuropathy   . Tobacco abuse    a. smoked x 25 years  . Vitamin D deficiency     Past Surgical History:  Procedure Laterality Date  . ARTHRODESIS    . BACK SURGERY     x 2  . discectomy    . trigger release      Social History   Tobacco Use  . Smoking status: Former Smoker    Packs/day: 1.00    Years: 10.00    Pack years: 10.00    Types: Cigarettes  . Smokeless tobacco: Never Used  Substance Use Topics  . Alcohol use: No     Current Outpatient Medications:  .  amLODipine (NORVASC) 10 MG tablet, Take 1 tablet (10 mg total) by mouth daily. Appointment needed please for further refills, Disp: 90 tablet, Rfl: 0 .  aspirin 325 MG tablet, Take 325 mg by mouth daily., Disp: , Rfl:  .  atorvastatin (LIPITOR) 20 MG tablet, Take 1 tablet (20 mg  total) by mouth daily., Disp: 90 tablet, Rfl: 3 .  B-D INS SYR ULTRAFINE 1CC/31G 31G X 5/16" 1 ML MISC, use 1 SYRINGE twice a day, Disp: , Rfl: 0 .  B-D UF III MINI PEN NEEDLES 31G X 5 MM MISC, use once daily, Disp: 100 each, Rfl: PRN .  Blood Glucose Monitoring Suppl (FREESTYLE PRECISION NEO SYSTEM) w/Device KIT, , Disp: , Rfl:  .  enalapril (VASOTEC) 20 MG tablet, Take 1 tablet (20 mg total) by mouth daily., Disp: 90 tablet, Rfl: 1 .  FREESTYLE PRECISION NEO TEST test strip, Use as Directed to check Blood Glucose daily, Disp: 100 each, Rfl: 0 .  furosemide (LASIX) 20 MG tablet, TAKE 1 TABLET BY MOUTH THREE TIMES PER WEEK FOR LEG SWELLING, Disp: 12 tablet, Rfl: 0 .   hydrochlorothiazide (HYDRODIURIL) 25 MG tablet, Take 1 tablet (25 mg total) by mouth daily., Disp: 90 tablet, Rfl: 1 .  Insulin Degludec-Liraglutide (XULTOPHY) 100-3.6 UNIT-MG/ML SOPN, Inject 16 Units into the skin daily., Disp: 3 pen, Rfl: 2 .  Lancets (FREESTYLE) lancets, , Disp: , Rfl:  .  metFORMIN (GLUCOPHAGE) 500 MG tablet, Take 1 tablet (500 mg total) by mouth 2 (two) times daily with a meal., Disp: 180 tablet, Rfl: 0 .  potassium chloride SA (K-DUR,KLOR-CON) 20 MEQ tablet, Take 1 tablet (20 mEq total) by mouth daily as needed., Disp: 30 tablet, Rfl: 11 .  diclofenac (VOLTAREN) 75 MG EC tablet, , Disp: , Rfl:   No Known Allergies  ROS  Constitutional: Negative for fever or weight change.  Respiratory: Positive for cough and Negative shortness of breath.   Cardiovascular: Negative for chest pain or palpitations.  Gastrointestinal: Negative for abdominal pain, no bowel changes.  Musculoskeletal: Positive for gait problem - dizziness  Negative for joint swelling.  Skin: Negative for rash.  Neurological: Positive for dizziness Negative headache.  No other specific complaints in a complete review of systems (except as listed in HPI above).  Objective  Vitals:   12/17/17 1009  BP: 130/76  Pulse: 78  Resp: 18  Temp: 98.2 F (36.8 C)  TempSrc: Oral  SpO2: 94%  Weight: 222 lb 12.8 oz (101.1 kg)  Height: 6' (1.829 m)    Body mass index is 30.22 kg/m.  Nursing Note and Vital Signs reviewed.  Physical Exam   Constitutional: Patient appears well-developed and well-nourished.  No distress.  HEENT: head atraumatic, normocephalic, pupils equal and reactive to light,  , neck supple without lymphadenopathy, oropharynx pink and moist without exudate, no thyromegaly,  Cardiovascular: Normal rate, regular rhythm, S1/S2 present.  murmur no rub heard. No edema noted  Pulmonary/Chest: Effort normal and breath sounds clear. No respiratory distress or retractions. Abdominal: Soft and  non-tender, bowel sounds present  Psychiatric: Patient has a normal mood and affect. behavior is normal. Judgment and thought content normal.  No results found for this or any previous visit (from the past 72 hour(s)).  Assessment & Plan  Patient presents today for refills of medications for chronic disease management of hypertension, hyperlipidemia, diabetes.  Patient states he sometimes forgets medications but attempts to be very adherent to plan.  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  Took him off of his Lasix today as he was on 2 diuretics will check CMP.  Refilled medications.  States patient was self treating with OTC insulin at home.  Discussed the dangers of insulin risk of hypoglycemia and death.  We will  check A1c today and work closely with patient to adjust medications and dosages which are decision-making.  Patient also endorses dizziness with change in position; positive Dix Hallpike maneuver will send to occupational for vestibular therapy and prescribed meclizine.  Discussed fall prevention.  We will follow-up in 1-2 weeks with patient 1. Vertigo - meclizine (ANTIVERT) 25 MG tablet; Take 1 tablet (25 mg total) by mouth 3 (three) times daily as needed for dizziness.  Dispense: 30 tablet; Refill: 0 - Ambulatory referral to Occupational Therapy  2. Hypertension, unspecified type  - COMPLETE METABOLIC PANEL WITH GFR  3. Hyperlipidemia, unspecified hyperlipidemia type  - Lipid Profile  4. Need for hepatitis C screening test  - Hepatitis C Antibody  5. Essential hypertension  - amLODipine (NORVASC) 10 MG tablet; Take 1 tablet (10 mg total) by mouth daily. Appointment needed please for further refills  Dispense: 90 tablet; Refill: 0 - enalapril (VASOTEC) 20 MG tablet; Take 1 tablet (20 mg total) by mouth daily.  Dispense: 90 tablet; Refill: 1  6. Dyslipidemia  - atorvastatin (LIPITOR) 20 MG tablet; Take 1 tablet (20 mg total) by mouth  daily.  Dispense: 90 tablet; Refill: 3  7. Uncontrolled type 2 diabetes mellitus without complication, with long-term current use of insulin (HCC)  - HgB A1c - hydrochlorothiazide (HYDRODIURIL) 25 MG tablet; Take 1 tablet (25 mg total) by mouth daily.  Dispense: 90 tablet; Refill: 1  8. Need for pneumococcal vaccine  - Pneumococcal conjugate vaccine 13-valent   -Red flags and when to present for emergency care or RTC including fever >101.48F, chest pain, shortness of breath, new/worsening/un-resolving symptoms, stroke symptoms reviewed with patient at time of visit. Follow up and care instructions discussed and provided in AVS. -Reviewed Health Maintenance: pneumo vax and hep C screening today  -------------------------------- I have reviewed this encounter including the documentation in this note and/or discussed this patient with the provider, [name] DNP AGNP-C. I am certifying that I agree with the content of this note as supervising physician. Enid Derry, [facility] 01/19/2018, 12:28 PM

## 2017-12-18 ENCOUNTER — Telehealth: Payer: Self-pay | Admitting: Emergency Medicine

## 2017-12-18 LAB — COMPLETE METABOLIC PANEL WITH GFR
AG Ratio: 1.7 (calc) (ref 1.0–2.5)
ALBUMIN MSPROF: 4.1 g/dL (ref 3.6–5.1)
ALT: 15 U/L (ref 9–46)
AST: 14 U/L (ref 10–35)
Alkaline phosphatase (APISO): 82 U/L (ref 40–115)
BILIRUBIN TOTAL: 0.7 mg/dL (ref 0.2–1.2)
BUN: 14 mg/dL (ref 7–25)
CALCIUM: 9.4 mg/dL (ref 8.6–10.3)
CO2: 31 mmol/L (ref 20–32)
CREATININE: 0.91 mg/dL (ref 0.70–1.18)
Chloride: 100 mmol/L (ref 98–110)
GFR, EST AFRICAN AMERICAN: 99 mL/min/{1.73_m2} (ref >=60)
GFR, EST NON AFRICAN AMERICAN: 85 mL/min/{1.73_m2} (ref >=60)
GLUCOSE: 308 mg/dL — AB (ref 65–139)
Globulin: 2.4 g/dL (calc) (ref 1.9–3.7)
Potassium: 4.4 mmol/L (ref 3.5–5.3)
Sodium: 138 mmol/L (ref 135–146)
TOTAL PROTEIN: 6.5 g/dL (ref 6.1–8.1)

## 2017-12-18 LAB — LIPID PANEL
CHOL/HDL RATIO: 3.6 (calc) (ref <5.0)
Cholesterol: 139 mg/dL (ref <200)
HDL: 39 mg/dL — ABNORMAL LOW (ref >40)
LDL CHOLESTEROL (CALC): 77 mg/dL
NON-HDL CHOLESTEROL (CALC): 100 mg/dL (ref <130)
TRIGLYCERIDES: 134 mg/dL (ref <150)

## 2017-12-18 LAB — HEMOGLOBIN A1C
EAG (MMOL/L): 13.3 (calc)
HEMOGLOBIN A1C: 10 %{Hb} — AB (ref <5.7)
Mean Plasma Glucose: 240 (calc)

## 2017-12-18 NOTE — Telephone Encounter (Signed)
I spoke to patient regarding lab results and he stated he had discussed with you that he was having dizzy spells. Please advise. Patient thought he was going get a prescription

## 2017-12-21 ENCOUNTER — Other Ambulatory Visit: Payer: Self-pay

## 2017-12-21 DIAGNOSIS — Z794 Long term (current) use of insulin: Principal | ICD-10-CM

## 2017-12-21 DIAGNOSIS — IMO0001 Reserved for inherently not codable concepts without codable children: Secondary | ICD-10-CM

## 2017-12-21 DIAGNOSIS — E1165 Type 2 diabetes mellitus with hyperglycemia: Principal | ICD-10-CM

## 2017-12-21 MED ORDER — INSULIN DEGLUDEC-LIRAGLUTIDE 100-3.6 UNIT-MG/ML ~~LOC~~ SOPN: 16.0000 [IU] | PEN_INJECTOR | Freq: Every day | SUBCUTANEOUS | 2 refills | Status: DC

## 2017-12-23 ENCOUNTER — Ambulatory Visit: Payer: Medicare HMO | Attending: Nurse Practitioner

## 2017-12-23 DIAGNOSIS — H8112 Benign paroxysmal vertigo, left ear: Secondary | ICD-10-CM | POA: Diagnosis present

## 2017-12-23 DIAGNOSIS — R42 Dizziness and giddiness: Secondary | ICD-10-CM | POA: Insufficient documentation

## 2017-12-23 NOTE — Therapy (Signed)
[place] [facility] SERVICES [address] [place], [place], [address] Phone: [phone]   Fax:  [phone]  Physical Therapy Treatment  Patient Details  Name: Victor Fox MRN: [id number] Date of Birth: 1946/04/30 Referring Provider: [name]   Encounter Date: 12/23/2017  PT End of Session - 12/23/17 2307    Visit Number  1    Number of Visits  13    Date for PT Re-Evaluation  02/03/18    Authorization Type  progress note 1/10    PT Start Time  1110    PT Stop Time  1200    PT Time Calculation (min)  50 min    Activity Tolerance  Patient tolerated treatment well    Behavior During Therapy  [facility] for tasks assessed/performed       Past Medical History:  Diagnosis Date  . Arthritis   . Benign neoplasm of unspecified site   . Carotid artery disease ([place])    a. carotid duplex 05/2015: less tahn 39% stenosis bilaterally, stable over the past year, recommend repeat imaging in 2 years (05/2017)  . Compression fracture    a. L2  . CVA (cerebral vascular accident) ([place]) 06/2012  . Diabetes mellitus without complication ([place])   . Heart murmur    a. echo 06/2012: EF >55%, LVH, aortic valve sclerosis  . Hyperlipidemia   . Hypertension   . Neuropathy   . Tobacco abuse    a. smoked x 25 years  . Vitamin D deficiency     Past Surgical History:  Procedure Laterality Date  . ARTHRODESIS    . BACK SURGERY     x 2  . discectomy    . trigger release      There were no vitals filed for this visit.  Subjective Assessment - 12/23/17 2304    Subjective  Dizziness    Pertinent History  Pt reports that a couple months ago he started getting dizzy when laying down in bed. Symptoms last for "a few minutes" and then go away. The symptoms recur when he goes to get up and then he feels unsteady on his feey. Pt has a history of lumbar spine fracture with external hardward. He has a history of neck pain as well.  He denies any auditory or  visual changes. No focal weakness or numbness/tingling. ROS negative for red flags.    Limitations  Walking    Diagnostic tests  None reported    Patient Stated Goals  Decrease dizziness and improve balance    Currently in Pain?  No/denies Not related to current episode        VESTIBULAR AND BALANCE EVALUATION   HISTORY:  Subjective history of current problem: He reports that a couple months ago he started getting dizzy when laying down in bed. Symptoms last for "a few minutes" and then go away. The symptoms recur when he goes to get up and then he feels unsteady on his feet. Pt has a history of lumbar spine fracture with external hardware. He has a history of neck pain as well.  He denies any auditory or visual changes. No focal weakness or numbness/tingling. ROS negative for red flags. Description of dizziness: (vertigo, unsteadiness, lightheadedness, falling, general unsteadiness, whoozy, swimmy-headed sensation, aural fullness): "I feel like I'm losing my balance." Pt also complains of "spinning" when laying down. Frequency: Multiple times/day Duration: "a few minutes" Symptom nature: (motion provoked, positional, spontaneous, constant, variable, intermittent): motion provoked, never spontaneous  Provocative Factors: Rolling in bed Easing Factors: rest, otherwise no known easing factors  Progression of symptoms: (better, worse, no change since onset): worsening History of similar episodes: No  Falls (yes/no): No Number of falls in past 6 months:   Prior Functional Level: Independent with ADLs/IADLs  Auditory complaints (tinnitus, pain, drainage): None Vision (last eye exam, diplopia, recent changes): None, wears glasses at all times. Struggles with reading small print due to vision  Current Symptoms: (dysarthria, dysphagia, drop attacks, bowel and bladder changes, recent weight loss/gain)    Review of systems negative for red flags.     EXAMINATION  POSTURE: Forward head  and rounded shoulders, loss of natural lumbar lordosis  NEUROLOGICAL SCREEN: (2+ unless otherwise noted.) N=normal  Ab=abnormal  Level Dermatome R L Myotome R L Reflex R L  C3 Anterior Neck N N Sidebend C2-3 N N Jaw CN V    C4 Top of Shoulder N N Shoulder Shrug C4 N N Hoffman's UMN    C5 Lateral Upper Arm N N Shoulder ABD C4-5 N N Biceps C5-6    C6 Lateral Arm/ Thumb N N Arm Flex/ Wrist Ext C5-6 N N Brachiorad. C5-6    C7 Middle Finger N N Arm Ext//Wrist Flex C6-7 N N Triceps C7    C8 4th & 5th Finger N N Flex/ Ext Carpi Ulnaris C8 N N Patella    T1 Medial Arm N N Interossei T1 N N Gastrocnemius    L2 Medial thigh/groin N N Illiopsoas (L2-3) N N     L3 Lower thigh/med.knee N N Quadriceps (L3-4) N N Patellar (L3-4)    L4 Medial leg/lat thigh N N Tibialis Ant (L4-5) N N     L5 Lat. leg & dorsal foot N N EHL (L5) N N     S1 post/lat foot/thigh/leg N N Gastrocnemius (S1-2) N N Gastrocnemius (S1)    S2 Post./med. thigh & leg N N Hamstrings (L4-S3) N N Babinski     Deferred reflex testing;  SOMATOSENSORY:         Sensation           Intact      Diminished         Absent  Light touch Normal     Any N & T in extremities or weakness: None      COORDINATION: Finger to Nose: Normal Rapid alternating movements: Normal Pronator Drift: normal   MUSCULOSKELETAL SCREEN: Cervical Spine ROM: Moderate loss throughout with mod/severe loss of cervical extension especially   ROM: Grossly WFL  MMT: Grossly WFL  Functional Mobility: Short steps  Gait: Scanning of visual environment with gait is: use of walking stick prn  Balance: Deferred full testing on this date   OCULOMOTOR / VESTIBULAR TESTING:  Oculomotor Exam- Room Light  Normal Abnormal Comments  Ocular Alignment N    Ocular ROM  A Decreased vertical gaze bilaterally, repeated cues to stay on task  Spontaneous Nystagmus N    End-Gaze Nystagmus N    Smooth Pursuit  A Saccadic intrusions  Saccades  A Slow and consistently  hypometric in horizontal and vertical plane  VOR N    VOR Cancellation N    Left Head Thrust N    Right Head Thrust  A Mildly positive to the R? Pt has difficulty relaxing neck  Head Shaking Nystagmus     Static Acuity     Dynamic Acuity      BPPV TESTS:  Symptoms Duration Intensity Nystagmus  L  Dix-Hallpike Vertigo 15-20s 7/10 Upbeating L torsional of moderate amplitude lasting for approximatley 15-20s, 2-3s latency to onset  R Dix-Hallpike Vertigo 8-10s 5/10 None observed  L Head Roll      R Head Roll      L Sidelying Test      R Sidelying Test            [facility] PT Assessment - 12/23/17 2305      Assessment   Medical Diagnosis  Vertigo    Referring Provider  [name]    Onset Date/Surgical Date  10/02/17 Approximate    Hand Dominance  Right    Next MD Visit  Not reported    Prior Therapy  None for current problem      Precautions   Precautions  Fall      Restrictions   Weight Bearing Restrictions  No      Balance Screen   Has the patient fallen in the past 6 months  No    Has the patient had a decrease in activity level because of a fear of falling?   No    Is the patient reluctant to leave their home because of a fear of falling?   No      Home Film/video editor residence    Living Arrangements  Alone      Prior Function   Level of Independence  Independent    Vocation  Part time employment    Vocation Requirements  Currently not working    Leisure  None reported      Cognition   Overall Cognitive Status  Within Functional Limits for tasks assessed      Observation/Other Assessments   Other Surveys   Other Surveys            TREATMENT  CRT Pt complains of 5/10 vertigo with R Dix-Hallpike testing but no nystagmus observed. Pt with vertigo of 7/10 intensity with L Dix-Hallpike test with moderate amplitude upbeating L torsional nystagmus with 2-3s latency lasting approximately 15-20 seconds. Pt taken through one  canalith repositioning treatment and with repeat Dix-Hallpike testing is negative on the L side. Second maneuver performed to ensure full clearance of debris.                   PT Education - 12/23/17 2307    Education provided  Yes    Education Details  Plan of care, BPPV    Person(s) Educated  Patient    Methods  Explanation;Handout    Comprehension  Verbalized understanding       PT Short Term Goals - 12/23/17 2322      PT SHORT TERM GOAL #1   Title  Pt will be independent with home E[place]y for HEP in order to treat symptoms at home if they recur.     Time  3    Period  Weeks    Status  New    Target Date  01/13/18        PT Long Term Goals - 12/23/17 2323      PT LONG TERM GOAL #1   Title  Pt will report no further episodes of vertigo with rolling in bed, when laying down, or when sitting up    Time  6    Period  Weeks    Status  New    Target Date  02/03/18      PT LONG TERM GOAL #2   Title  Pt will participate with balance screening with therapy in order to assess and treat any balance deficits    Time  6    Period  Weeks    Status  New    Target Date  02/03/18            Plan - 12/23/17 2318    Clinical Impression Statement  Pt is a pleasant 73 yo male referred to vestibular therapy for vertigo. He reports that a couple months ago he started getting dizzy when laying down in bed. Symptoms last for "a few minutes" and then go away. The symptoms recur when he goes to get up and then he feels unsteady on his feet. He denies any auditory or visual changes as well as focal weakness or numbness/tingling. No focal strength or sensory deficits noted with testing. Pt does have saccadic intrusions during smooth pursuit as well as consistently hypometric horizontal saccades bilaterally as well as vertically. Possible mild positive R head thrust. He complains of 5/10 vertigo with R Dix-Hallpike testing but no nystagmus observed. Pt with vertigo of 7/10  intensity with L Dix-Hallpike test with moderate amplitude upbeating L torsional nystagmus with 2-3s latency lasting approximately 15-20 seconds. Pt taken through one canalith repositioning treatment and with repeat Dix-Hallpike testing is negative on the L side. Second maneuver performed to ensure full clearance of debris. PT examination is consistent with possible L posterior canal BPPV and pt responds to canalith repositioning.  Once BPPV is successfully treated pt would benefit from balance screening as he reports some general difficulty with his balance as well. Of unrelated noted pt does appear to have some difficulty with vertical gaze during EOM testing. Mild pill-rolling tremor noted in RUE. Pt will benefit from skilled PT services to address above noted deficits.    History and Personal Factors relevant to plan of care:  2 personal factors/comorbidities, 3 body systems/activity limitations/participation restrictions      Clinical Presentation  Evolving    Clinical Presentation due to:  Pt reports that symptoms are worsening    Clinical Decision Making  Moderate    Rehab Potential  Excellent    PT Frequency  2x / week    PT Duration  6 weeks    PT Treatment/Interventions  Aquatic Therapy;Canalith Repostioning;Cryotherapy;Electrical Stimulation;Iontophoresis 4mg /ml Dexamethasone;Moist Heat;Ultrasound;DME Instruction;Gait training;Stair training;Functional mobility training;Therapeutic activities;Therapeutic exercise;Balance training;Neuromuscular re-education;Patient/family education;Manual techniques;Passive range of motion;Vestibular    PT Next Visit Plan  repeat Dix-Hallpike bilaterally, ensure full clearance of L posterior canal, consider Frenzel lenses for R side (pt with vertigo but no observable nystagmus in room light)    PT Home Exercise Plan  None    Consulted and Agree with Plan of Care  Patient       Patient will benefit from skilled therapeutic intervention in order to improve the  following deficits and impairments:  Dizziness, Difficulty walking, Decreased balance  Visit Diagnosis: Dizziness and giddiness - Plan: PT plan of care cert/re-cert  BPPV (benign paroxysmal positional vertigo), left - Plan: PT plan of care cert/re-cert     Problem List Patient Active Problem List   Diagnosis Date Noted  . Vertigo 12/17/2017  . Uncontrolled type 2 diabetes mellitus without complication, with long-term current use of insulin ([place]) 12/17/2017  . Hyperlipidemia 04/14/2016  . Pain and swelling of left wrist 07/10/2015  . Dyslipidemia 07/10/2015  . Carotid artery disease ([place])   . Heart murmur 10/03/2013  . HTN (hypertension) 10/03/2013  . CVA (cerebral vascular accident) ([place]) 10/03/2013  .  Carotid artery stenosis 10/03/2013   Phillips Grout PT, DPT   Huprich,Jason 12/23/2017, 11:26 PM  [place] [facility]IN [facility] SERVICES [address] [place], [place], [address] Phone: [phone]   Fax:  [phone]  Name: ABDO DENAULT MRN: [id number] Date of Birth: Jun 10, 1946

## 2017-12-31 ENCOUNTER — Ambulatory Visit: Payer: Medicare HMO | Admitting: Nurse Practitioner

## 2017-12-31 ENCOUNTER — Telehealth: Payer: Self-pay

## 2017-12-31 NOTE — Telephone Encounter (Signed)
Pt is due for AWV, has been scheduled to be seen by [facility] several times and has canceled or NS'd each time. Called pt to make an another attempt to schedule AWV. LVM requesting returned call. Does not appear to have a future appt schedule with provider.

## 2018-01-01 NOTE — Telephone Encounter (Signed)
Please call patient and request appointment would like to discuss in detail diabetes plan due to elevated a1c

## 2018-01-01 NOTE — Telephone Encounter (Signed)
Eft detailed voicemail

## 2018-01-02 ENCOUNTER — Other Ambulatory Visit: Payer: Self-pay | Admitting: Cardiovascular Disease

## 2018-01-05 ENCOUNTER — Encounter: Payer: Self-pay | Admitting: Family Medicine

## 2018-01-05 ENCOUNTER — Ambulatory Visit (INDEPENDENT_AMBULATORY_CARE_PROVIDER_SITE_OTHER): Payer: Medicare HMO | Admitting: Family Medicine

## 2018-01-05 VITALS — BP 160/60 | HR 75 | Temp 98.2°F | Resp 14 | Ht 72.0 in | Wt 222.8 lb

## 2018-01-05 DIAGNOSIS — E1165 Type 2 diabetes mellitus with hyperglycemia: Secondary | ICD-10-CM | POA: Diagnosis not present

## 2018-01-05 DIAGNOSIS — I350 Nonrheumatic aortic (valve) stenosis: Secondary | ICD-10-CM | POA: Diagnosis not present

## 2018-01-05 DIAGNOSIS — Z794 Long term (current) use of insulin: Secondary | ICD-10-CM | POA: Diagnosis not present

## 2018-01-05 DIAGNOSIS — I69391 Dysphagia following cerebral infarction: Secondary | ICD-10-CM

## 2018-01-05 DIAGNOSIS — I10 Essential (primary) hypertension: Secondary | ICD-10-CM | POA: Diagnosis not present

## 2018-01-05 DIAGNOSIS — I779 Disorder of arteries and arterioles, unspecified: Secondary | ICD-10-CM

## 2018-01-05 DIAGNOSIS — IMO0001 Reserved for inherently not codable concepts without codable children: Secondary | ICD-10-CM

## 2018-01-05 DIAGNOSIS — E669 Obesity, unspecified: Secondary | ICD-10-CM | POA: Diagnosis not present

## 2018-01-05 DIAGNOSIS — I739 Peripheral vascular disease, unspecified: Secondary | ICD-10-CM

## 2018-01-05 MED ORDER — INSULIN DEGLUDEC-LIRAGLUTIDE 100-3.6 UNIT-MG/ML ~~LOC~~ SOPN: 24.0000 [IU] | PEN_INJECTOR | Freq: Every day | SUBCUTANEOUS | Status: DC

## 2018-01-05 MED ORDER — EMPAGLIFLOZIN 10 MG PO TABS: 10.0000 mg | ORAL_TABLET | Freq: Every day | ORAL | 1 refills | Status: DC

## 2018-01-05 MED ORDER — ATORVASTATIN CALCIUM 40 MG PO TABS: 40.0000 mg | ORAL_TABLET | Freq: Every day | ORAL | 1 refills | Status: DC

## 2018-01-05 NOTE — Assessment & Plan Note (Addendum)
Offered swallowing evaluation, explained risk of aspiration, but patient politely declined

## 2018-01-05 NOTE — Assessment & Plan Note (Signed)
Praised patient for weight loss from last July, but he still needs to lose more; asked him to try to lose 20 pounds over the next several months; see AVS; will increase the xultophy and add SGLT-2 as well, which may assist with weight loss

## 2018-01-05 NOTE — Assessment & Plan Note (Addendum)
Ordered carotid US; goal LDL less than 70, so I will increase his statin

## 2018-01-05 NOTE — Patient Instructions (Addendum)
Take your Xultophy as follows:  Wednesday, Thursday, Friday, Saturday (May 8, 9, 10, 11): 20 units  Sunday, Monday, Tuesday, Wednesday (May 12, 13, 14, 15): 22 units  Thursday, Friday, Saturday, Sunday (May 16, 17, 18, 19): 24 units  We'll get you to the diabetes specialist  INCREASE your atorvastatin from 20 mg to 40 mg daily to help lower your cholesterol; we want that LDL number under 70  Try to limit saturated fats in your diet (bologna, hot dogs, barbeque, cheeseburgers, hamburgers, steak, bacon, sausage, cheese, etc.) and get more fresh fruits, vegetables, and whole grains  Please call [phone] to schedule your imaging test of your carotids Please wait 2-3 days after the order has been placed to call and get your test scheduled  Please do see Dr. Rockey Situ about your aortic valve stenosis and your blood pressure because your two numbers are quite far apart (154 over 60), and we'll ask him to see if another echo is appropriate and ask him to adjust your blood pressure medicine  Try to follow the DASH guidelines (DASH stands for Dietary Approaches to Stop Hypertension). Try to limit the sodium in your diet to no more than 1,500mg  of sodium per day. Certainly try to not exceed 2,000 mg per day at the very most. Do not add salt when cooking or at the table.  Check the sodium amount on labels when shopping, and choose items lower in sodium when given a choice. Avoid or limit foods that already contain a lot of sodium. Eat a diet rich in fruits and vegetables and whole grains, and try to lose weight if overweight or obese  Check out the information at familydoctor.org entitled "Nutrition for Weight Loss: What You Need to Know about Fad Diets" Try to lose between 1-2 pounds per week by taking in fewer calories and burning off more calories You can succeed by limiting portions, limiting foods dense in calories and fat, becoming more active, and drinking 8 glasses of water a day (64  ounces) Don't skip meals, especially breakfast, as skipping meals may alter your metabolism Do not use over-the-counter weight loss pills or gimmicks that claim rapid weight loss A healthy BMI (or body mass index) is between 18.5 and 24.9 You can calculate your ideal BMI at the [place] website ClubMonetize.fr  DASH Eating Plan DASH stands for "Dietary Approaches to Stop Hypertension." The DASH eating plan is a healthy eating plan that has been shown to reduce high blood pressure (hypertension). It may also reduce your risk for type 2 diabetes, heart disease, and stroke. The DASH eating plan may also help with weight loss. What are tips for following this plan? General guidelines  Avoid eating more than 2,300 mg (milligrams) of salt (sodium) a day. If you have hypertension, you may need to reduce your sodium intake to 1,500 mg a day.  Limit alcohol intake to no more than 1 drink a day for nonpregnant women and 2 drinks a day for men. One drink equals 12 oz of beer, 5 oz of wine, or 1 oz of hard liquor.  Work with your health care provider to maintain a healthy body weight or to lose weight. Ask what an ideal weight is for you.  Get at least 30 minutes of exercise that causes your heart to beat faster (aerobic exercise) most days of the week. Activities may include walking, swimming, or biking.  Work with your health care provider or diet and nutrition specialist (dietitian) to adjust your eating  plan to your individual calorie needs. Reading food labels  Check food labels for the amount of sodium per serving. Choose foods with less than 5 percent of the Daily Value of sodium. Generally, foods with less than 300 mg of sodium per serving fit into this eating plan.  To find whole grains, look for the word "whole" as the first word in the ingredient list. Shopping  Buy products labeled as "low-sodium" or "no salt added."  Buy fresh foods. Avoid  canned foods and premade or frozen meals. Cooking  Avoid adding salt when cooking. Use salt-free seasonings or herbs instead of table salt or sea salt. Check with your health care provider or pharmacist before using salt substitutes.  Do not fry foods. Cook foods using healthy methods such as baking, boiling, grilling, and broiling instead.  Cook with heart-healthy oils, such as olive, canola, soybean, or sunflower oil. Meal planning   Eat a balanced diet that includes: ? 5 or more servings of fruits and vegetables each day. At each meal, try to fill half of your plate with fruits and vegetables. ? Up to 6-8 servings of whole grains each day. ? Less than 6 oz of lean meat, poultry, or fish each day. A 3-oz serving of meat is about the same size as a deck of cards. One egg equals 1 oz. ? 2 servings of low-fat dairy each day. ? A serving of nuts, seeds, or beans 5 times each week. ? Heart-healthy fats. Healthy fats called Omega-3 fatty acids are found in foods such as flaxseeds and coldwater fish, like sardines, salmon, and mackerel.  Limit how much you eat of the following: ? Canned or prepackaged foods. ? Food that is high in trans fat, such as fried foods. ? Food that is high in saturated fat, such as fatty meat. ? Sweets, desserts, sugary drinks, and other foods with added sugar. ? Full-fat dairy products.  Do not salt foods before eating.  Try to eat at least 2 vegetarian meals each week.  Eat more home-cooked food and less restaurant, buffet, and fast food.  When eating at a restaurant, ask that your food be prepared with less salt or no salt, if possible. What foods are recommended? The items listed may not be a complete list. Talk with your dietitian about what dietary choices are best for you. Grains Whole-grain or whole-wheat bread. Whole-grain or whole-wheat pasta. Brown rice. [place]. Bulgur. Whole-grain and low-sodium cereals. Pita bread. Low-fat, low-sodium  crackers. Whole-wheat flour tortillas. Vegetables Fresh or frozen vegetables (raw, steamed, roasted, or grilled). Low-sodium or reduced-sodium tomato and vegetable juice. Low-sodium or reduced-sodium tomato sauce and tomato paste. Low-sodium or reduced-sodium canned vegetables. Fruits All fresh, dried, or frozen fruit. Canned fruit in natural juice (without added sugar). Meat and other protein foods Skinless chicken or [place]. Ground chicken or [place]. Pork with fat trimmed off. Fish and seafood. Egg whites. Dried beans, peas, or lentils. Unsalted nuts, nut butters, and seeds. Unsalted canned beans. Lean cuts of beef with fat trimmed off. Low-sodium, lean deli meat. Dairy Low-fat (1%) or fat-free (skim) milk. Fat-free, low-fat, or reduced-fat cheeses. Nonfat, low-sodium ricotta or cottage cheese. Low-fat or nonfat yogurt. Low-fat, low-sodium cheese. Fats and oils Soft margarine without trans fats. Vegetable oil. Low-fat, reduced-fat, or light mayonnaise and salad dressings (reduced-sodium). Canola, safflower, olive, soybean, and sunflower oils. Avocado. Seasoning and other foods Herbs. Spices. Seasoning mixes without salt. Unsalted popcorn and pretzels. Fat-free sweets. What foods are not recommended? The items listed  may not be a complete list. Talk with your dietitian about what dietary choices are best for you. Grains Baked goods made with fat, such as croissants, muffins, or some breads. Dry pasta or rice meal packs. Vegetables Creamed or fried vegetables. Vegetables in a cheese sauce. Regular canned vegetables (not low-sodium or reduced-sodium). Regular canned tomato sauce and paste (not low-sodium or reduced-sodium). Regular tomato and vegetable juice (not low-sodium or reduced-sodium). Angie Fava. Olives. Fruits Canned fruit in a light or heavy syrup. Fried fruit. Fruit in cream or butter sauce. Meat and other protein foods Fatty cuts of meat. Ribs. Fried meat. Berniece Salines. Sausage. Bologna and  other processed lunch meats. Salami. Fatback. Hotdogs. Bratwurst. Salted nuts and seeds. Canned beans with added salt. Canned or smoked fish. Whole eggs or egg yolks. Chicken or [place] with skin. Dairy Whole or 2% milk, cream, and half-and-half. Whole or full-fat cream cheese. Whole-fat or sweetened yogurt. Full-fat cheese. Nondairy creamers. Whipped toppings. Processed cheese and cheese spreads. Fats and oils Butter. Stick margarine. Lard. Shortening. Ghee. Bacon fat. Tropical oils, such as coconut, palm kernel, or palm oil. Seasoning and other foods Salted popcorn and pretzels. Onion salt, garlic salt, seasoned salt, table salt, and sea salt. Worcestershire sauce. Tartar sauce. Barbecue sauce. Teriyaki sauce. Soy sauce, including reduced-sodium. Steak sauce. Canned and packaged gravies. Fish sauce. Oyster sauce. Cocktail sauce. Horseradish that you find on the shelf. Ketchup. Mustard. Meat flavorings and tenderizers. Bouillon cubes. Hot sauce and Tabasco sauce. Premade or packaged marinades. Premade or packaged taco seasonings. Relishes. Regular salad dressings. Where to find more information:  National Heart, Lung, and [place]: https://wilson-eaton.com/  American Heart Association: www.heart.org Summary  The DASH eating plan is a healthy eating plan that has been shown to reduce high blood pressure (hypertension). It may also reduce your risk for type 2 diabetes, heart disease, and stroke.  With the DASH eating plan, you should limit salt (sodium) intake to 2,300 mg a day. If you have hypertension, you may need to reduce your sodium intake to 1,500 mg a day.  When on the DASH eating plan, aim to eat more fresh fruits and vegetables, whole grains, lean proteins, low-fat dairy, and heart-healthy fats.  Work with your health care provider or diet and nutrition specialist (dietitian) to adjust your eating plan to your individual calorie needs. This information is not intended to replace advice  given to you by your health care provider. Make sure you discuss any questions you have with your health care provider. Document Released: 08/07/2011 Document Revised: 08/11/2016 Document Reviewed: 08/11/2016 Elsevier Interactive Patient Education  2018 [employer].  Obesity, Adult Obesity is the condition of having too much total body fat. Being overweight or obese means that your weight is greater than what is considered healthy for your body size. Obesity is determined by a measurement called BMI. BMI is an estimate of body fat and is calculated from height and weight. For adults, a BMI of 30 or higher is considered obese. Obesity can eventually lead to other health concerns and major illnesses, including:  Stroke.  Coronary artery disease (CAD).  Type 2 diabetes.  Some types of cancer, including cancers of the colon, breast, uterus, and gallbladder.  Osteoarthritis.  High blood pressure (hypertension).  High cholesterol.  Sleep apnea.  Gallbladder stones.  Infertility problems.  What are the causes? The main cause of obesity is taking in (consuming) more calories than your body uses for energy. Other factors that contribute to this condition may include:  Being born with genes that make you more likely to become obese.  Having a medical condition that causes obesity. These conditions include: ? Hypothyroidism. ? Polycystic ovarian syndrome (PCOS). ? Binge-eating disorder. ? Cushing syndrome.  Taking certain medicines, such as steroids, antidepressants, and seizure medicines.  Not being physically active (sedentary lifestyle).  Living where there are limited places to exercise safely or buy healthy foods.  Not getting enough sleep.  What increases the risk? The following factors may increase your risk of this condition:  Having a family history of obesity.  Being a woman of African-American descent.  Being a man of Hispanic descent.  What are the signs or  symptoms? Having excessive body fat is the main symptom of this condition. How is this diagnosed? This condition may be diagnosed based on:  Your symptoms.  Your medical history.  A physical exam. Your health care provider may measure: ? Your BMI. If you are an adult with a BMI between 25 and less than 30, you are considered overweight. If you are an adult with a BMI of 30 or higher, you are considered obese. ? The distances around your hips and your waist (circumferences). These may be compared to each other to help diagnose your condition. ? Your skinfold thickness. Your health care provider may gently pinch a fold of your skin and measure it.  How is this treated? Treatment for this condition often includes changing your lifestyle. Treatment may include some or all of the following:  Dietary changes. Work with your health care provider and a dietitian to set a weight-loss goal that is healthy and reasonable for you. Dietary changes may include eating: ? Smaller portions. A portion size is the amount of a particular food that is healthy for you to eat at one time. This varies from person to person. ? Low-calorie or low-fat options. ? More whole grains, fruits, and vegetables.  Regular physical activity. This may include aerobic activity (cardio) and strength training.  Medicine to help you lose weight. Your health care provider may prescribe medicine if you are unable to lose 1 pound a week after 6 weeks of eating more healthily and doing more physical activity.  Surgery. Surgical options may include gastric banding and gastric bypass. Surgery may be done if: ? Other treatments have not helped to improve your condition. ? You have a BMI of 40 or higher. ? You have life-threatening health problems related to obesity.  Follow these instructions at home:  Eating and drinking   Follow recommendations from your health care provider about what you eat and drink. Your health care  provider may advise you to: ? Limit fast foods, sweets, and processed snack foods. ? Choose low-fat options, such as low-fat milk instead of whole milk. ? Eat 5 or more servings of fruits or vegetables every day. ? Eat at home more often. This gives you more control over what you eat. ? Choose healthy foods when you eat out. ? Learn what a healthy portion size is. ? Keep low-fat snacks on hand. ? Avoid sugary drinks, such as soda, fruit juice, iced tea sweetened with sugar, and flavored milk. ? Eat a healthy breakfast.  Drink enough water to keep your urine clear or pale yellow.  Do not go without eating for long periods of time (do not fast) or follow a fad diet. Fasting and fad diets can be unhealthy and even dangerous. Physical Activity  Exercise regularly, as told by your health care provider. Ask  your health care provider what types of exercise are safe for you and how often you should exercise.  Warm up and stretch before being active.  Cool down and stretch after being active.  Rest between periods of activity. Lifestyle  Limit the time that you spend in front of your TV, computer, or video game system.  Find ways to reward yourself that do not involve food.  Limit alcohol intake to no more than 1 drink a day for nonpregnant women and 2 drinks a day for men. One drink equals 12 oz of beer, 5 oz of wine, or 1 oz of hard liquor. General instructions  Keep a weight loss journal to keep track of the food you eat and how much you exercise you get.  Take over-the-counter and prescription medicines only as told by your health care provider.  Take vitamins and supplements only as told by your health care provider.  Consider joining a support group. Your health care provider may be able to recommend a support group.  Keep all follow-up visits as told by your health care provider. This is important. Contact a health care provider if:  You are unable to meet your weight loss  goal after 6 weeks of dietary and lifestyle changes. This information is not intended to replace advice given to you by your health care provider. Make sure you discuss any questions you have with your health care provider. Document Released: 09/25/2004 Document Revised: 01/21/2016 Document Reviewed: 06/06/2015 Elsevier Interactive Patient Education  2018 [place].  Preventing Unhealthy [employer], Adult Staying at a healthy weight is important. When fat builds up in your body, you may become overweight or obese. These conditions put you at greater risk for developing certain health problems, such as heart disease, diabetes, sleeping problems, joint problems, and some cancers. Unhealthy weight gain is often the result of making unhealthy choices in what you eat. It is also a result of not getting enough exercise. You can make changes to your lifestyle to prevent obesity and stay as healthy as possible. What nutrition changes can be made? To maintain a healthy weight and prevent obesity:  Eat only as much as your body needs. To do this: ? Pay attention to signs that you are hungry or full. Stop eating as soon as you feel full. ? If you feel hungry, try drinking water first. Drink enough water so your urine is clear or pale yellow. ? Eat smaller portions. ? Look at serving sizes on food labels. Most foods contain more than one serving per container. ? Eat the recommended amount of calories for your gender and activity level. While most active people should eat around 2,000 calories per day, if you are trying to lose weight or are not very active, you main need to eat less calories. Talk to your health care provider or dietitian about how many calories you should eat each day.  Choose healthy foods, such as: ? Fruits and vegetables. Try to fill at least half of your plate at each meal with fruits and vegetables. ? Whole grains, such as whole wheat bread, brown rice, and quinoa. ? Lean meats,  such as chicken or fish. ? Other healthy proteins, such as beans, eggs, or tofu. ? Healthy fats, such as nuts, seeds, fatty fish, and olive oil. ? Low-fat or fat-free dairy.  Check food labels and avoid food and drinks that: ? Are high in calories. ? Have added sugar. ? Are high in sodium. ? Have saturated  fats or trans fats.  Limit how much you eat of the following foods: ? Prepackaged meals. ? Fast food. ? Fried foods. ? Processed meat, such as bacon, sausage, and deli meats. ? Fatty cuts of red meat and poultry with skin.  Cook foods in healthier ways, such as by baking, broiling, or grilling.  When grocery shopping, try to shop around the outside of the store. This helps you buy mostly fresh foods and avoid canned and prepackaged foods.  What lifestyle changes can be made?  Exercise at least 30 minutes 5 or more days each week. Exercising includes brisk walking, yard work, biking, running, swimming, and team sports like basketball and soccer. Ask your health care provider which exercises are safe for you.  Do not use any products that contain nicotine or tobacco, such as cigarettes and e-cigarettes. If you need help quitting, ask your health care provider.  Limit alcohol intake to no more than 1 drink a day for nonpregnant women and 2 drinks a day for men. One drink equals 12 oz of beer, 5 oz of wine, or 1 oz of hard liquor.  Try to get 7-9 hours of sleep each night. What other changes can be made?  Keep a food and activity journal to keep track of: ? What you ate and how many calories you had. Remember to count sauces, dressings, and side dishes. ? Whether you were active, and what exercises you did. ? Your calorie, weight, and activity goals.  Check your weight regularly. Track any changes. If you notice you have gained weight, make changes to your diet or activity routine.  Avoid taking weight-loss medicines or supplements. Talk to your health care provider before  starting any new medicine or supplement.  Talk to your health care provider before trying any new diet or exercise plan. Why are these changes important? Eating healthy, staying active, and having healthy habits not only help prevent obesity, they also:  Help you to manage stress and emotions.  Help you to connect with friends and family.  Improve your self-esteem.  Improve your sleep.  Prevent long-term health problems.  What can happen if changes are not made? Being obese or overweight can cause you to develop joint or bone problems, which can make it hard for you to stay active or do activities you enjoy. Being obese or overweight also puts stress on your heart and lungs and can lead to health problems like diabetes, heart disease, and some cancers. Where to find more information: Talk with your health care provider or a dietitian about healthy eating and healthy lifestyle choices. You may also find other information through these resources:  U.S. Department of Agriculture MyPlate: FormerBoss.no  American Heart Association: www.heart.org  Centers for Disease Control and Prevention: http://www.wolf.info/  Summary  Staying at a healthy weight is important. It helps prevent certain diseases and health problems, such as heart disease, diabetes, joint problems, sleep disorders, and some cancers.  Being obese or overweight can cause you to develop joint or bone problems, which can make it hard for you to stay active or do activities you enjoy.  You can prevent unhealthy weight gain by eating a healthy diet, exercising regularly, not smoking, limiting alcohol, and getting enough sleep.  Talk with your health care provider or a dietitian for guidance about healthy eating and healthy lifestyle choices. This information is not intended to replace advice given to you by your health care provider. Make sure you discuss any questions you have  with your health care provider. Document  Released: 08/19/2016 Document Revised: 09/24/2016 Document Reviewed: 09/24/2016 Elsevier Interactive Patient Education  [employer].
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
## 2018-01-05 NOTE — Assessment & Plan Note (Signed)
Refer to endocrinologist; taper up the Xultophy by 2 units every 4 hours, and I wrote out instructions for him on his after visit summary; add SGLT-2 as well; continue metformin; weight loss of 20 pounds over next several months recommended; foot exam by MD today; eye referral entered

## 2018-01-05 NOTE — Assessment & Plan Note (Addendum)
Managed by cardiologist; encouraged patient to see him yearly

## 2018-01-05 NOTE — Assessment & Plan Note (Signed)
Patient's pulse pressure is 100 today; will send note to cardiologist to ask for assistance with patient's pressures; patient may need another echo or we may need to be more permissive with our systolic parameters; I suspect arteriosclerosis plays a part; will leave his medicines at current levels and he'll take today's doses when he gets home (but he is only a few hours late he reports); DASH guidelines and weight loss of 20 pounds recommended

## 2018-01-10 ENCOUNTER — Other Ambulatory Visit: Payer: Self-pay | Admitting: Cardiovascular Disease

## 2018-01-15 ENCOUNTER — Telehealth: Payer: Self-pay | Admitting: *Deleted

## 2018-01-15 NOTE — Telephone Encounter (Signed)
-----   Message from Minna Merritts, MD sent at 01/15/2018 10:17 AM EDT ----- Have not seen him for a while Suspect not aortic valve as he had same issue in 2017, mild AS  Triage , can we have him monitor blood pressure at home and write them down He is overdue for a visit, can he bring them in If pressures improved at home, would probably be less aggressive with systolic given low diastolic. thx TG     ----- Message ----- From: [name], MD Sent: 01/05/2018  10:50 AM To: Minna Merritts, MD  What would you suggest we do for his hypertension? Is arterial stiffness the cause for widened pulse pressure or aortic stenosis? Should we be content if diastolic is 60? Thanks for your smarts!

## 2018-01-15 NOTE — Telephone Encounter (Signed)
Patient verbalized understanding. He does not have BP cuff but will look into getting one to start taking BP/HR at home. Scheduled him to be seen on 03/05/18. He cannot come in to sooner appointment because of his work schedule.

## 2018-01-19 DIAGNOSIS — M542 Cervicalgia: Secondary | ICD-10-CM | POA: Insufficient documentation

## 2018-01-19 DIAGNOSIS — B351 Tinea unguium: Secondary | ICD-10-CM | POA: Insufficient documentation

## 2018-01-19 DIAGNOSIS — D369 Benign neoplasm, unspecified site: Secondary | ICD-10-CM | POA: Insufficient documentation

## 2018-01-19 DIAGNOSIS — G589 Mononeuropathy, unspecified: Secondary | ICD-10-CM | POA: Insufficient documentation

## 2018-01-19 DIAGNOSIS — I69993 Ataxia following unspecified cerebrovascular disease: Secondary | ICD-10-CM | POA: Insufficient documentation

## 2018-01-19 DIAGNOSIS — E559 Vitamin D deficiency, unspecified: Secondary | ICD-10-CM | POA: Insufficient documentation

## 2018-02-10 LAB — HM DIABETES EYE EXAM

## 2018-02-25 ENCOUNTER — Other Ambulatory Visit: Payer: Self-pay | Admitting: Family Medicine

## 2018-02-25 DIAGNOSIS — R9389 Abnormal findings on diagnostic imaging of other specified body structures: Secondary | ICD-10-CM

## 2018-02-25 DIAGNOSIS — R05 Cough: Secondary | ICD-10-CM

## 2018-02-25 DIAGNOSIS — R059 Cough, unspecified: Secondary | ICD-10-CM

## 2018-03-03 ENCOUNTER — Ambulatory Visit: Admission: RE | Admit: 2018-03-03 | Payer: Medicare HMO | Source: Ambulatory Visit
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
## 2018-03-05 ENCOUNTER — Ambulatory Visit: Payer: Medicare HMO | Admitting: Cardiovascular Disease

## 2018-03-08 ENCOUNTER — Ambulatory Visit (INDEPENDENT_AMBULATORY_CARE_PROVIDER_SITE_OTHER): Payer: Medicare HMO | Admitting: Family Medicine

## 2018-03-08 ENCOUNTER — Encounter: Payer: Self-pay | Admitting: Family Medicine

## 2018-03-08 ENCOUNTER — Encounter: Payer: Self-pay | Admitting: Cardiovascular Disease

## 2018-03-08 VITALS — BP 104/58 | HR 69 | Temp 97.9°F | Resp 14 | Ht 72.0 in | Wt 216.1 lb

## 2018-03-08 DIAGNOSIS — I350 Nonrheumatic aortic (valve) stenosis: Secondary | ICD-10-CM | POA: Diagnosis not present

## 2018-03-08 DIAGNOSIS — Z794 Long term (current) use of insulin: Secondary | ICD-10-CM | POA: Diagnosis not present

## 2018-03-08 DIAGNOSIS — E1165 Type 2 diabetes mellitus with hyperglycemia: Secondary | ICD-10-CM | POA: Diagnosis not present

## 2018-03-08 DIAGNOSIS — E782 Mixed hyperlipidemia: Secondary | ICD-10-CM | POA: Diagnosis not present

## 2018-03-08 DIAGNOSIS — IMO0001 Reserved for inherently not codable concepts without codable children: Secondary | ICD-10-CM

## 2018-03-08 DIAGNOSIS — Z1159 Encounter for screening for other viral diseases: Secondary | ICD-10-CM

## 2018-03-08 DIAGNOSIS — I779 Disorder of arteries and arterioles, unspecified: Secondary | ICD-10-CM

## 2018-03-08 DIAGNOSIS — I10 Essential (primary) hypertension: Secondary | ICD-10-CM

## 2018-03-08 DIAGNOSIS — I739 Peripheral vascular disease, unspecified: Secondary | ICD-10-CM

## 2018-03-08 DIAGNOSIS — E663 Overweight: Secondary | ICD-10-CM

## 2018-03-08 MED ORDER — HYDROCHLOROTHIAZIDE 25 MG PO TABS: 12.5000 mg | ORAL_TABLET | Freq: Every day | ORAL | 1 refills | Status: DC

## 2018-03-08 MED ORDER — INSULIN DEGLUDEC-LIRAGLUTIDE 100-3.6 UNIT-MG/ML ~~LOC~~ SOPN: 24.0000 [IU] | PEN_INJECTOR | Freq: Every day | SUBCUTANEOUS | 0 refills | Status: DC

## 2018-03-08 NOTE — Patient Instructions (Addendum)
Return on or after July 19th for labs (do NOT fast) Continue the Xultophy for now We can either keep you in samples OR switch to 70/30 insulin; just let me know when you get low on the Xultophy sample Please call [phone] to schedule your imaging test of your neck Please wait 2-3 days after the order has been placed to call and get your test scheduled  Decrease the hydrochlorothiazide to just HALF of a pill once a day (12.5 mg)

## 2018-03-08 NOTE — Progress Notes (Signed)
BP (!) 104/58   Pulse 69   Temp 97.9 F (36.6 C) (Oral)   Resp 14   Ht 6' (1.829 m)   Wt 216 lb 1.6 oz (98 kg)   SpO2 97%   BMI 29.31 kg/m    Subjective:    Patient ID: Victor Fox, male    DOB: 09-20-1945, 72 y.o.   MRN: [id number]  HPI: Victor Fox is a 72 y.o. male  Chief Complaint  Patient presents with  . Follow-up    HPI  Type 2 diabetes; last seen 01/05/18; he has been out of insulin since Saturday; eats all the time; staying away from sugary foods; drinks diet Coke; lots of water; cannot afford the Jard[employer]; last eye exam June 12th and no DPR; exercising to keep weight controlled; no CP or SHOB Lab Results  Component Value Date   HGBA1C 10.0 (H) 12/17/2017   High cholesterol; LDL 77 in April; no red meat; no fatty pig meats; eats fish  High blood pressure; does not feel tired or run down; not sure why; feels good  Carotid artery disease; imaging ordered in May, but not resulted yet  Since last visit, he was seen at [facility] clinic for lower resp tract infection   Saw Dr. Rockey Situ, cardiologist  Cardiac murmur; 30 years; last echo 11/2016, mild aortic stenosis  Overweight, walking in the afternoons up and down the driveway, lost 6 pounds  Depression screen [facility] 2/9 03/08/2018 01/05/2018 03/30/2017 12/11/2016 04/14/2016  Decreased Interest 0 0 0 0 0  Down, Depressed, Hopeless 0 0 0 0 0  PHQ - 2 Score 0 0 0 0 0    Relevant past medical, surgical, family and social history reviewed Past Medical History:  Diagnosis Date  . Arthritis   . Benign neoplasm of unspecified site   . Carotid artery disease ([place])    a. carotid duplex 05/2015: less tahn 39% stenosis bilaterally, stable over the past year, recommend repeat imaging in 2 years (05/2017)  . Compression fracture    a. L2  . CVA (cerebral vascular accident) ([place]) 06/2012  . Diabetes mellitus without complication ([place])   . Heart murmur    a. echo 06/2012: EF >55%, LVH, aortic valve sclerosis  .  Hyperlipidemia   . Hypertension   . Neuropathy   . Tobacco abuse    a. smoked x 25 years  . Vitamin D deficiency    Past Surgical History:  Procedure Laterality Date  . ARTHRODESIS    . BACK SURGERY     x 2  . discectomy    . trigger release     Family History  Problem Relation Age of Onset  . Heart disease Brother   . Hypertension Mother    Social History   Tobacco Use  . Smoking status: Former Smoker    Packs/day: 1.00    Years: 10.00    Pack years: 10.00    Types: Cigarettes  . Smokeless tobacco: Never Used  Substance Use Topics  . Alcohol use: No  . Drug use: No    Interim medical history since last visit reviewed. Allergies and medications reviewed  Review of Systems Per HPI unless specifically indicated above     Objective:    BP (!) 104/58   Pulse 69   Temp 97.9 F (36.6 C) (Oral)   Resp 14   Ht 6' (1.829 m)   Wt 216 lb 1.6 oz (98 kg)   SpO2 97%   BMI 29.31  kg/m   Wt Readings from Last 3 Encounters:  03/08/18 216 lb 1.6 oz (98 kg)  01/05/18 222 lb 12.8 oz (101.1 kg)  12/17/17 222 lb 12.8 oz (101.1 kg)    Physical Exam  Constitutional: He appears well-developed and well-nourished. No distress.  HENT:  Head: Normocephalic and atraumatic.  Eyes: EOM are normal. No scleral icterus.  Neck: No thyromegaly present.  Cardiovascular: Normal rate and regular rhythm.  Pulmonary/Chest: Effort normal and breath sounds normal.  Abdominal: Soft. Bowel sounds are normal. He exhibits no distension.  Musculoskeletal: He exhibits no edema.  Neurological: Coordination normal.  Skin: Skin is warm and dry. No pallor.  Psychiatric: He has a normal mood and affect. His behavior is normal. Judgment and thought content normal.   Diabetic Foot Form - Detailed   No data filed      Results for orders placed or performed in visit on 02/11/18  HM DIABETES EYE EXAM  Result Value Ref Range   HM Diabetic Eye Exam No Retinopathy No Retinopathy      Assessment &  Plan:   Problem List Items Addressed This Visit      Cardiovascular and Mediastinum   HTN (hypertension) (Chronic)    On the low side; will decrease the HCTZ and recheck when he returns for labs      Relevant Medications   hydrochlorothiazide (HYDRODIURIL) 25 MG tablet   Carotid artery disease (HCC) (Chronic)    Ordered, but not resulted; I encouraged patient to get this scheduled, number given to him so he can call and have scheduled      Relevant Medications   hydrochlorothiazide (HYDRODIURIL) 25 MG tablet   Aortic stenosis, mild (Chronic)    Managed by Dr. Rockey Situ, cardiologist      Relevant Medications   hydrochlorothiazide (HYDRODIURIL) 25 MG tablet     Endocrine   Uncontrolled type 2 diabetes mellitus without complication, with long-term current use of insulin ([place]) - Primary    Foot exam by MD today; check A1c in a few weeks      Relevant Medications   Insulin Degludec-Liraglutide (XULTOPHY) 100-3.6 UNIT-MG/ML SOPN   hydrochlorothiazide (HYDRODIURIL) 25 MG tablet   Other Relevant Orders   Hemoglobin A1c   Microalbumin / creatinine urine ratio   Lipid panel   Basic metabolic panel     Other   Overweight (BMI 25.0-29.9) (Chronic)    Praise given for modest weight loss, activity      Hyperlipidemia (Chronic)    Encouraged healthy eating, discussed limiting saturated fats in diet      Relevant Medications   hydrochlorothiazide (HYDRODIURIL) 25 MG tablet   Other Relevant Orders   Lipid panel    Other Visit Diagnoses    Encounter for hepatitis C screening test for low risk patient       Relevant Orders   Hepatitis C antibody      Follow up plan: Return in about 3 months (around 06/21/2018) for follow-up visit with Dr. Sanda Klein.  An after-visit summary was printed and given to the patient at [facility].  Please see the patient instructions which may contain other information and recommendations beyond what is mentioned above in the assessment and plan.  Meds  ordered this encounter  Medications  . Insulin Degludec-Liraglutide (XULTOPHY) 100-3.6 UNIT-MG/ML SOPN    Sig: Inject 24 Units into the skin daily.    Dispense:  1 pen    Refill:  0    Lot # [id number], exp Oct 2019  .  hydrochlorothiazide (HYDRODIURIL) 25 MG tablet    Sig: Take 0.5 tablets (12.5 mg total) by mouth daily.    Dispense:  45 tablet    Refill:  1    We're decreasing the dose -- thank you, cancel any Rx for whole pill    Orders Placed This Encounter  Procedures  . Hemoglobin A1c  . Microalbumin / creatinine urine ratio  . Lipid panel  . Basic metabolic panel  . Hepatitis C antibody

## 2018-03-10 DIAGNOSIS — E663 Overweight: Secondary | ICD-10-CM | POA: Insufficient documentation

## 2018-03-10 NOTE — Assessment & Plan Note (Addendum)
On the low side; will decrease the HCTZ and recheck when he returns for labs

## 2018-03-10 NOTE — Assessment & Plan Note (Signed)
Managed by Dr. Rockey Situ, cardiologist

## 2018-03-10 NOTE — Assessment & Plan Note (Signed)
Praise given for modest weight loss, activity

## 2018-03-10 NOTE — Assessment & Plan Note (Signed)
Foot exam by MD today; check A1c in a few weeks

## 2018-03-10 NOTE — Assessment & Plan Note (Addendum)
Ordered, but not resulted; I encouraged patient to get this scheduled, number given to him so he can call and have scheduled

## 2018-03-10 NOTE — Assessment & Plan Note (Signed)
Encouraged healthy eating, discussed limiting saturated fats in diet

## 2018-06-15 ENCOUNTER — Ambulatory Visit: Payer: Medicare HMO | Admitting: Family Medicine

## 2018-06-21 ENCOUNTER — Ambulatory Visit: Payer: Medicare HMO | Admitting: Family Medicine

## 2018-07-05 ENCOUNTER — Other Ambulatory Visit: Payer: Self-pay

## 2018-07-05 DIAGNOSIS — I10 Essential (primary) hypertension: Secondary | ICD-10-CM

## 2018-07-05 MED ORDER — AMLODIPINE BESYLATE 10 MG PO TABS: 10.0000 mg | ORAL_TABLET | Freq: Every day | ORAL | 0 refills | Status: DC

## 2018-07-05 NOTE — Telephone Encounter (Signed)
Patient has not had his labs drawn He no showed for his last visit in October Please urge him to schedule a visit ASAP for his out of control diabetes and other health issues

## 2018-07-05 NOTE — Telephone Encounter (Signed)
lvm on [phone] @ 2:21 informing pt that a limited supply of his prescription has been sent to his pharmacy and that Dr Sanda Klein is asking that he return call to schedule an appt for additional refills

## 2018-07-17 ENCOUNTER — Telehealth: Payer: Self-pay | Admitting: Nurse Practitioner

## 2018-07-19 ENCOUNTER — Other Ambulatory Visit: Payer: Self-pay | Admitting: Family Medicine

## 2018-07-19 NOTE — Telephone Encounter (Signed)
Patient needs an appointment ASAP I do not have any of the lab results that were ordered to be done in July I'll give limited Rx and we look forward to seeing him very soon

## 2018-07-20 NOTE — Telephone Encounter (Signed)
Called [phone] @ 8:25 left voice message informing pt that prescription has been sent to pharmacy but asked him to return call to schedule appt. If Dr Sanda Klein is booked then he does have the option of seeing Benjamine Mola.

## 2018-09-01 ENCOUNTER — Emergency Department: Payer: Medicare HMO

## 2018-09-01 ENCOUNTER — Other Ambulatory Visit: Payer: Self-pay

## 2018-09-01 ENCOUNTER — Encounter: Payer: Self-pay | Admitting: Emergency Medicine

## 2018-09-01 ENCOUNTER — Inpatient Hospital Stay
Admission: EM | Admit: 2018-09-01 | Discharge: 2018-09-03 | DRG: 195 | Disposition: A | Discharge status: Home / Self Care | Payer: Medicare HMO | Attending: Internal Medicine | Admitting: Internal Medicine

## 2018-09-01 DIAGNOSIS — Z981 Arthrodesis status: Secondary | ICD-10-CM

## 2018-09-01 DIAGNOSIS — I700 Atherosclerosis of aorta: Secondary | ICD-10-CM | POA: Diagnosis present

## 2018-09-01 DIAGNOSIS — I350 Nonrheumatic aortic (valve) stenosis: Secondary | ICD-10-CM | POA: Diagnosis present

## 2018-09-01 DIAGNOSIS — Z7982 Long term (current) use of aspirin: Secondary | ICD-10-CM

## 2018-09-01 DIAGNOSIS — M1990 Unspecified osteoarthritis, unspecified site: Secondary | ICD-10-CM | POA: Diagnosis present

## 2018-09-01 DIAGNOSIS — E785 Hyperlipidemia, unspecified: Secondary | ICD-10-CM | POA: Diagnosis present

## 2018-09-01 DIAGNOSIS — Z79899 Other long term (current) drug therapy: Secondary | ICD-10-CM | POA: Diagnosis not present

## 2018-09-01 DIAGNOSIS — Z23 Encounter for immunization: Secondary | ICD-10-CM | POA: Diagnosis present

## 2018-09-01 DIAGNOSIS — I10 Essential (primary) hypertension: Secondary | ICD-10-CM | POA: Diagnosis present

## 2018-09-01 DIAGNOSIS — E1140 Type 2 diabetes mellitus with diabetic neuropathy, unspecified: Secondary | ICD-10-CM | POA: Diagnosis present

## 2018-09-01 DIAGNOSIS — Z8249 Family history of ischemic heart disease and other diseases of the circulatory system: Secondary | ICD-10-CM

## 2018-09-01 DIAGNOSIS — I2510 Atherosclerotic heart disease of native coronary artery without angina pectoris: Secondary | ICD-10-CM | POA: Diagnosis present

## 2018-09-01 DIAGNOSIS — R918 Other nonspecific abnormal finding of lung field: Secondary | ICD-10-CM | POA: Diagnosis present

## 2018-09-01 DIAGNOSIS — Z794 Long term (current) use of insulin: Secondary | ICD-10-CM | POA: Diagnosis not present

## 2018-09-01 DIAGNOSIS — I351 Nonrheumatic aortic (valve) insufficiency: Secondary | ICD-10-CM | POA: Diagnosis not present

## 2018-09-01 DIAGNOSIS — J189 Pneumonia, unspecified organism: Secondary | ICD-10-CM | POA: Diagnosis present

## 2018-09-01 DIAGNOSIS — J180 Bronchopneumonia, unspecified organism: Secondary | ICD-10-CM | POA: Diagnosis present

## 2018-09-01 DIAGNOSIS — Z8673 Personal history of transient ischemic attack (TIA), and cerebral infarction without residual deficits: Secondary | ICD-10-CM

## 2018-09-01 DIAGNOSIS — Z87891 Personal history of nicotine dependence: Secondary | ICD-10-CM

## 2018-09-01 LAB — BASIC METABOLIC PANEL
Anion gap: 6 (ref 5–15)
BUN: 12 mg/dL (ref 8–23)
CO2: 28 mmol/L (ref 22–32)
Calcium: 8.8 mg/dL — ABNORMAL LOW (ref 8.9–10.3)
Chloride: 100 mmol/L (ref 98–111)
Creatinine, Ser: 0.8 mg/dL (ref 0.61–1.24)
GFR calc Af Amer: >60 mL/min (ref >60)
GFR calc non Af Amer: >60 mL/min (ref >60)
Glucose, Bld: 368 mg/dL — ABNORMAL HIGH (ref 70–99)
Potassium: 3.8 mmol/L (ref 3.5–5.1)
SODIUM: 134 mmol/L — AB (ref 135–145)

## 2018-09-01 LAB — CBC WITH DIFFERENTIAL/PLATELET
Abs Immature Granulocytes: 0.05 10*3/uL (ref 0.00–0.07)
Basophils Absolute: 0 10*3/uL (ref 0.0–0.1)
Basophils Relative: 0 %
Eosinophils Absolute: 0.2 10*3/uL (ref 0.0–0.5)
Eosinophils Relative: 1 %
HCT: 39.3 % (ref 39.0–52.0)
HEMOGLOBIN: 13.3 g/dL (ref 13.0–17.0)
Immature Granulocytes: 0 %
Lymphocytes Relative: 20 %
Lymphs Abs: 2.3 10*3/uL (ref 0.7–4.0)
MCH: 28.2 pg (ref 26.0–34.0)
MCHC: 33.8 g/dL (ref 30.0–36.0)
MCV: 83.3 fL (ref 80.0–100.0)
Monocytes Absolute: 0.8 10*3/uL (ref 0.1–1.0)
Monocytes Relative: 7 %
Neutro Abs: 8.4 10*3/uL — ABNORMAL HIGH (ref 1.7–7.7)
Neutrophils Relative %: 72 %
Platelets: 232 10*3/uL (ref 150–400)
RBC: 4.72 MIL/uL (ref 4.22–5.81)
RDW: 12.4 % (ref 11.5–15.5)
WBC: 11.7 10*3/uL — ABNORMAL HIGH (ref 4.0–10.5)
nRBC: 0 % (ref 0.0–0.2)

## 2018-09-01 LAB — GLUCOSE, CAPILLARY: Glucose-Capillary: 417 mg/dL — ABNORMAL HIGH (ref 70–99)

## 2018-09-01 LAB — INFLUENZA PANEL BY PCR (TYPE A & B)
Influenza A By PCR: NEGATIVE
Influenza B By PCR: NEGATIVE

## 2018-09-01 IMAGING — CT CT CHEST W/ CM
2 of 3 series · 14 of 36 positions shown, 17 images · IV contrast (omnipaque)
Comparison: None.

CLINICAL DATA: 72-year-old male with history of productive cough
and mid chest pain for the past 2 weeks.

EXAM:
CT CHEST WITH CONTRAST
TECHNIQUE: Multidetector CT imaging of the chest was performed during
intravenous contrast administration.
CONTRAST:  75mL OMNIPAQUE IOHEXOL 300 MG/ML  SOLN

[Series 2: axial st · axial · 0.73mm/px · z∈[-206,+82]mm · 11 of 170 slices shown, 14 images]
[im 13/170  mediastinal]
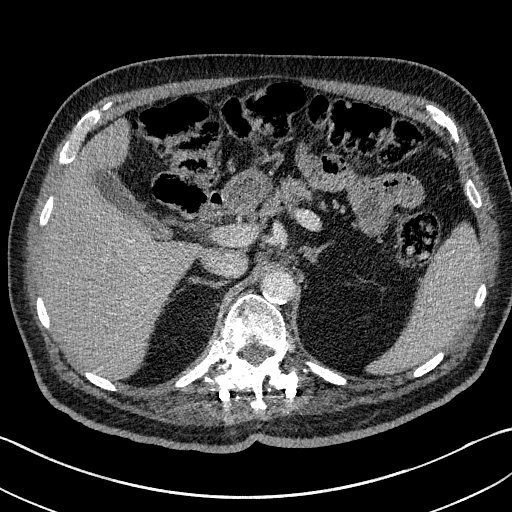
[im 13/170  lung]
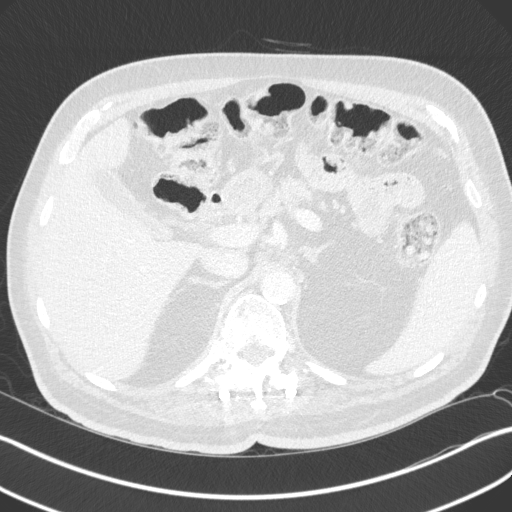
[im 26/170  lung]
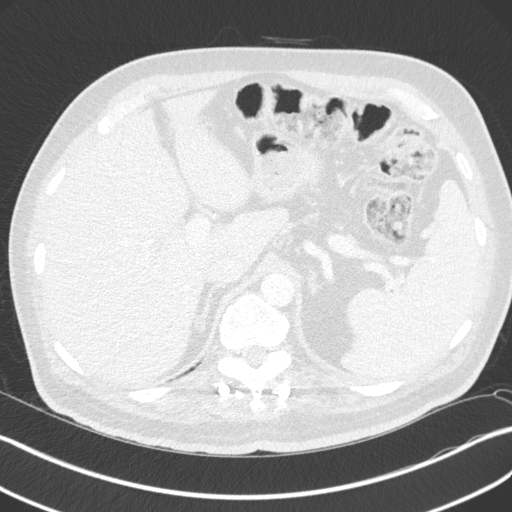
[im 38/170  lung]
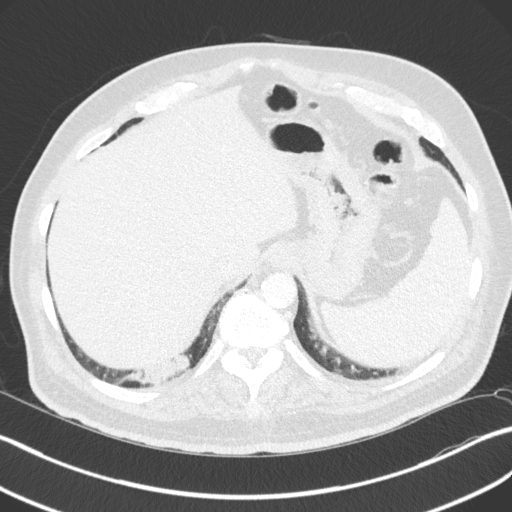
[im 57/170  lung]
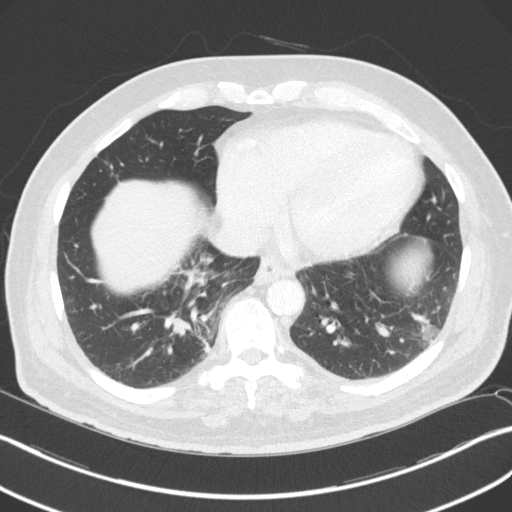
[im 69/170  mediastinal]
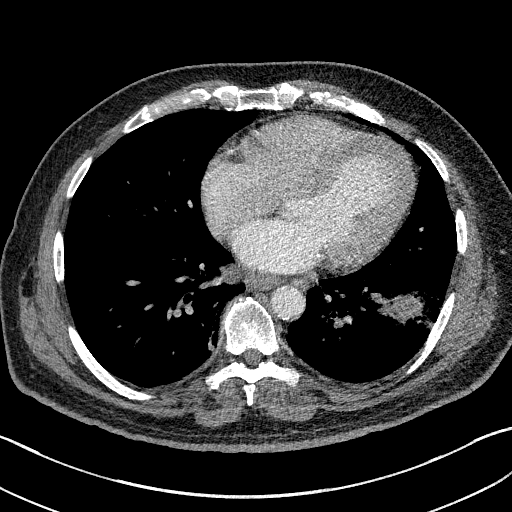
[im 69/170  lung]
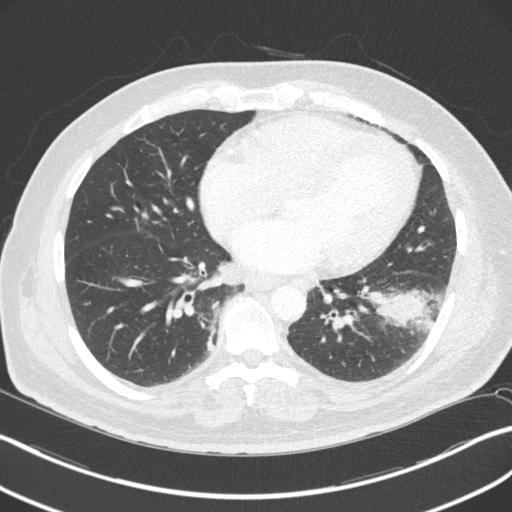
[im 88/170  lung]
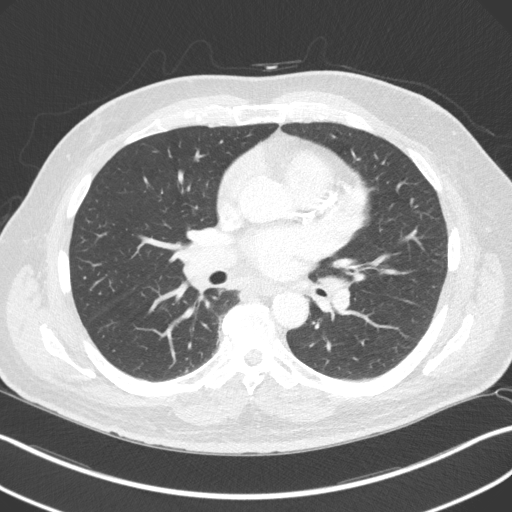
[im 101/170  lung]
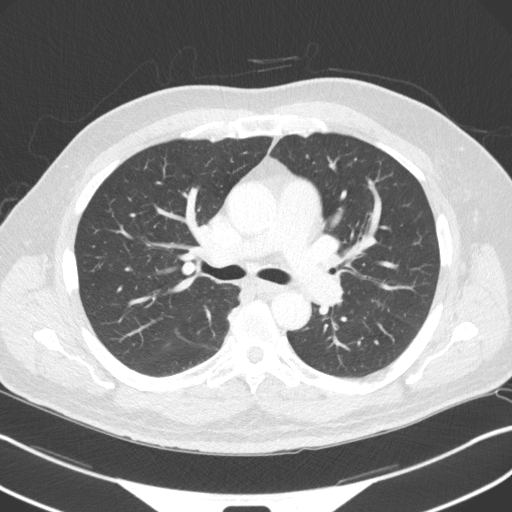
[im 113/170  lung]
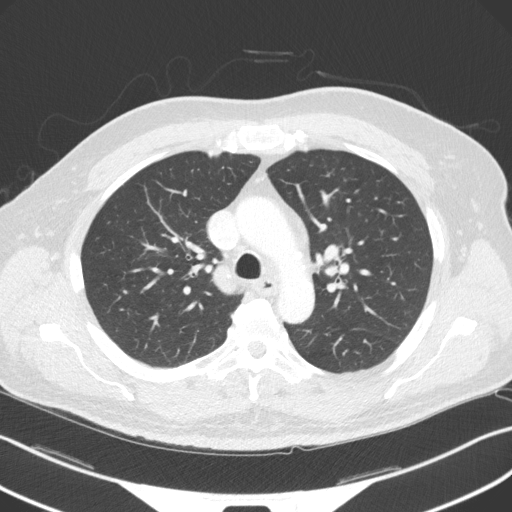
[im 132/170  mediastinal]
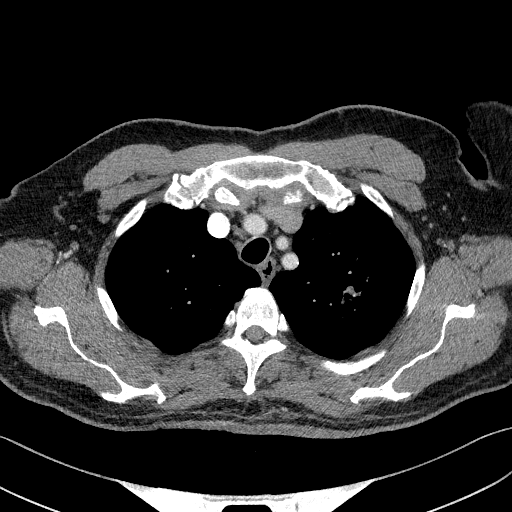
[im 132/170  lung]
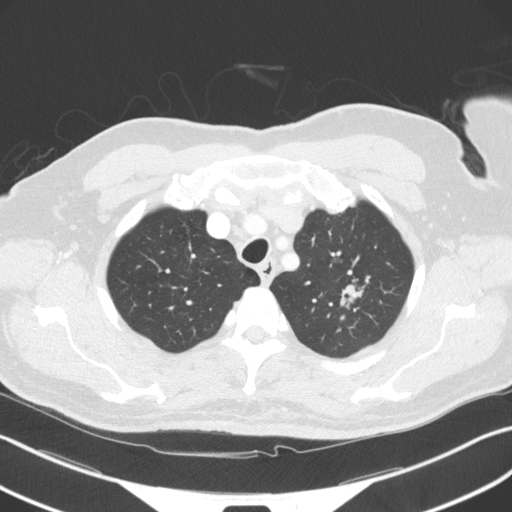
[im 144/170  lung]
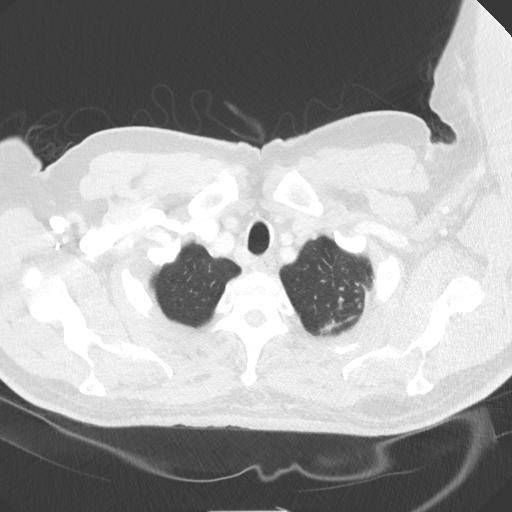
[im 157/170  lung]
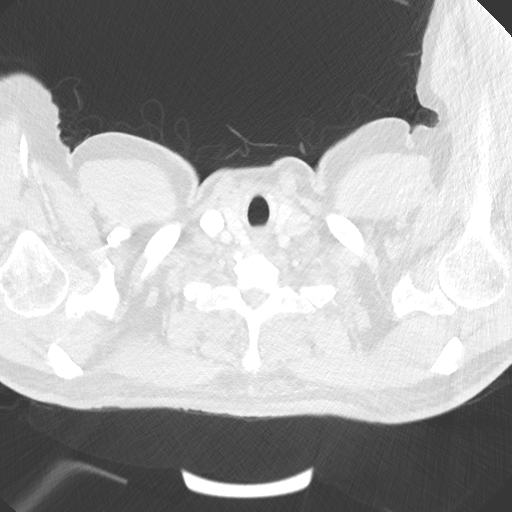

[Series 5: coronal · coronal · 0.67mm/px · 3 of 135 slices shown]
[im 27/135  lung]
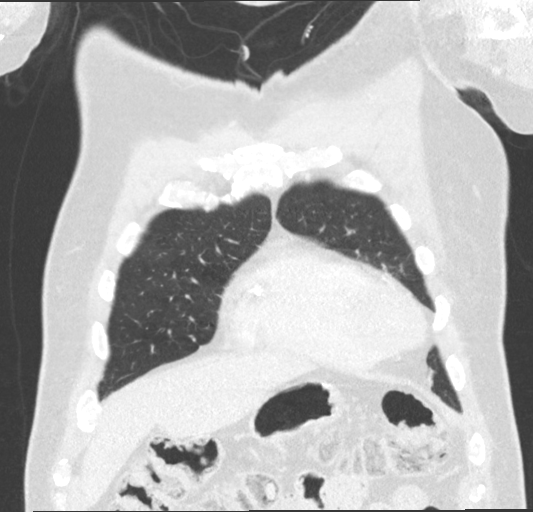
[im 54/135  lung]
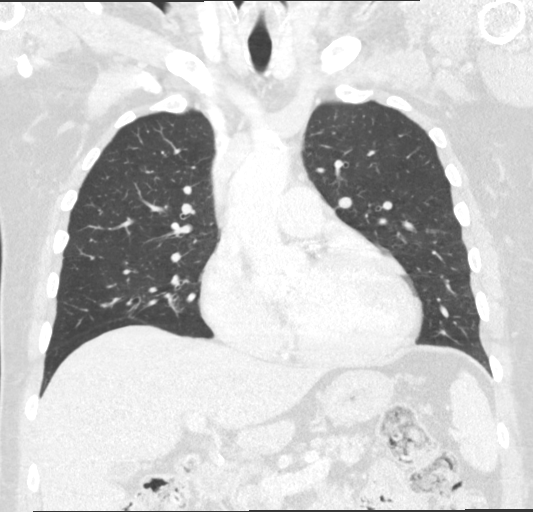
[im 81/135  lung]
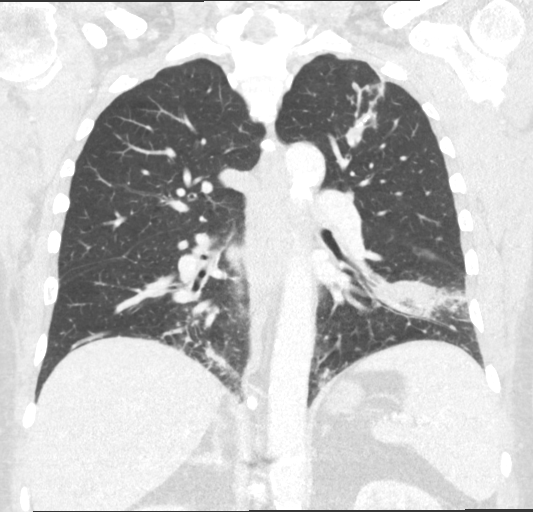

[14 of 36 positions shown; findings below may reference images not displayed]

FINDINGS: Cardiovascular: Heart size is normal. There is no significant
pericardial fluid, thickening or pericardial calcification. There is
aortic atherosclerosis, as well as atherosclerosis of the great
vessels of the mediastinum and the coronary arteries, including
calcified atherosclerotic plaque in the left main, left anterior
descending, left circumflex and right coronary arteries. Severe
calcifications of the aortic valve. Moderate calcifications of the
mitral annulus.

Mediastinum/Nodes: No pathologically enlarged mediastinal or hilar
lymph nodes. Esophagus is unremarkable in appearance. No axillary
lymphadenopathy.

Lungs/Pleura: Airspace consolidation in the periphery of the left
lower lobe. Multiple other areas of branching density corresponding
to mucoid impacted bronchi, most evident in the left upper lobe near
the apex and in the posterior aspect of the right lower lobe. Patchy
areas of ground-glass attenuation and septal thickening also noted,
likely to reflect areas of acute inflammation/infection, most
evident in the medial aspect of the right lower lobe. A few
scattered pulmonary nodules are noted, largest of which measures up
to 8 mm in the right lower lobe (axial image 111 of series 3).

Upper Abdomen: Aortic atherosclerosis. Subcentimeter low-attenuation
lesion in the upper pole the left kidney, too small to characterize,
but statistically likely a cyst.

Musculoskeletal: Orthopedic fixation hardware in the lower thoracic
spine and upper lumbar spine, incompletely imaged. There are no
aggressive appearing lytic or blastic lesions noted in the
visualized portions of the skeleton.
IMPRESSION: 1. Multilobar bronchopneumonia, most confluent in the left lower
lobe. In addition, there are extensive areas of mucoid impaction,
most evident in the left upper lobe and right lower lobe. Follow-up
radiographs are recommended to ensure complete resolution of these
findings, as the possibility of a centrally obstructing lesion
(particularly in the right lower lobe) is not entirely excluded.
2. Multiple small pulmonary nodules measuring up to 8 mm in the
right lower lobe. Non-contrast chest CT at 3-6 months is
recommended. If the nodules are stable at time of repeat CT, then
future CT at 18-24 months (from today's scan) is considered optional
for low-risk patients, but is recommended for high-risk patients.
This recommendation follows the consensus statement: Guidelines for
Management of Incidental Pulmonary Nodules Detected on CT Images:
3. Aortic atherosclerosis, in addition to left main and 3 vessel
coronary artery disease. Assessment for potential risk factor
modification, dietary therapy or pharmacologic therapy may be
warranted, if clinically indicated.
4. There are calcifications of the aortic valve. Echocardiographic
correlation for evaluation of potential valvular dysfunction may be
warranted if clinically indicated.

Aortic Atherosclerosis ([TT]-[TT]).

## 2018-09-01 IMAGING — CR DG CHEST 2V
1 series · 2 of 2 positions shown · non-contrast
Comparison: [DATE], [DATE] and [DATE]

CLINICAL DATA: Productive cough and chest pain.

EXAM:
CHEST - 2 VIEW

[Series 1: dg chest 2 view · 0.14mm/px · 2 of 2 slices shown]
[im 1/2]
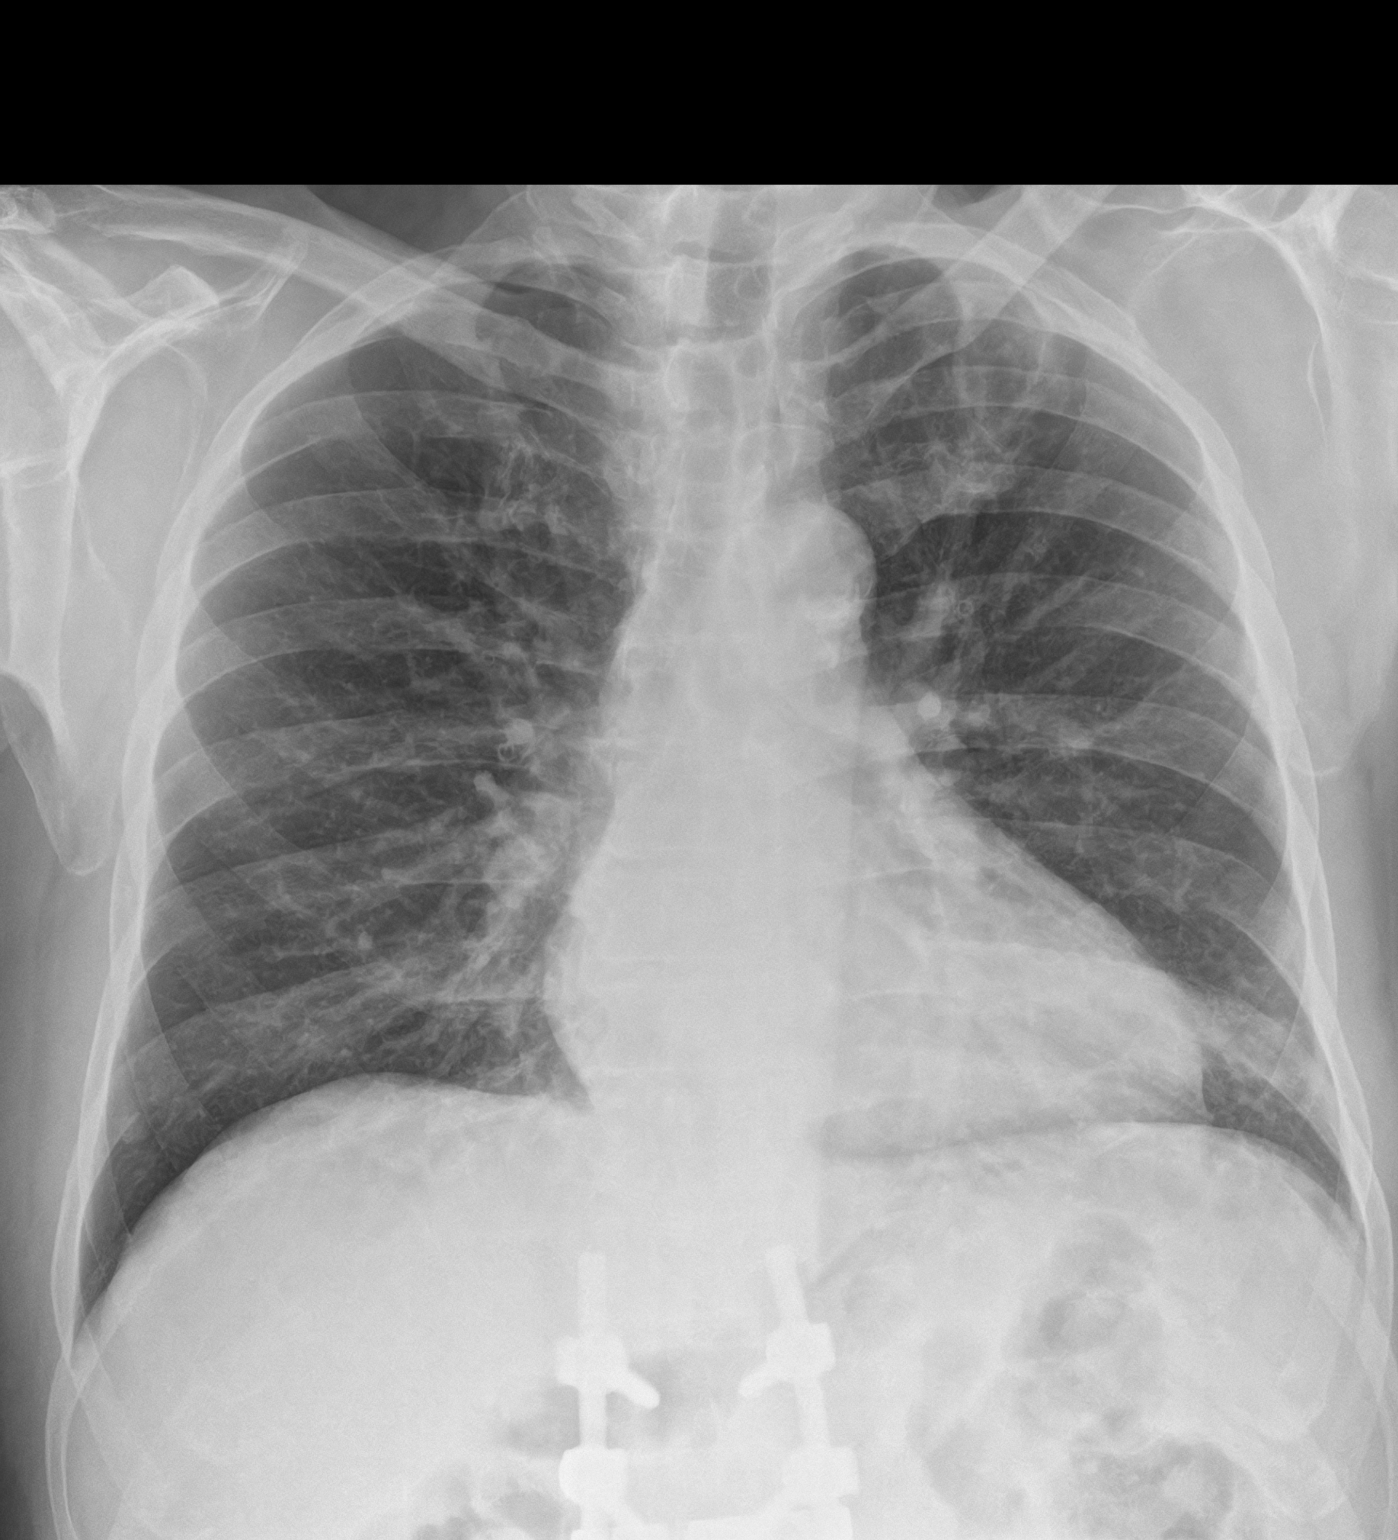
[im 2/2]
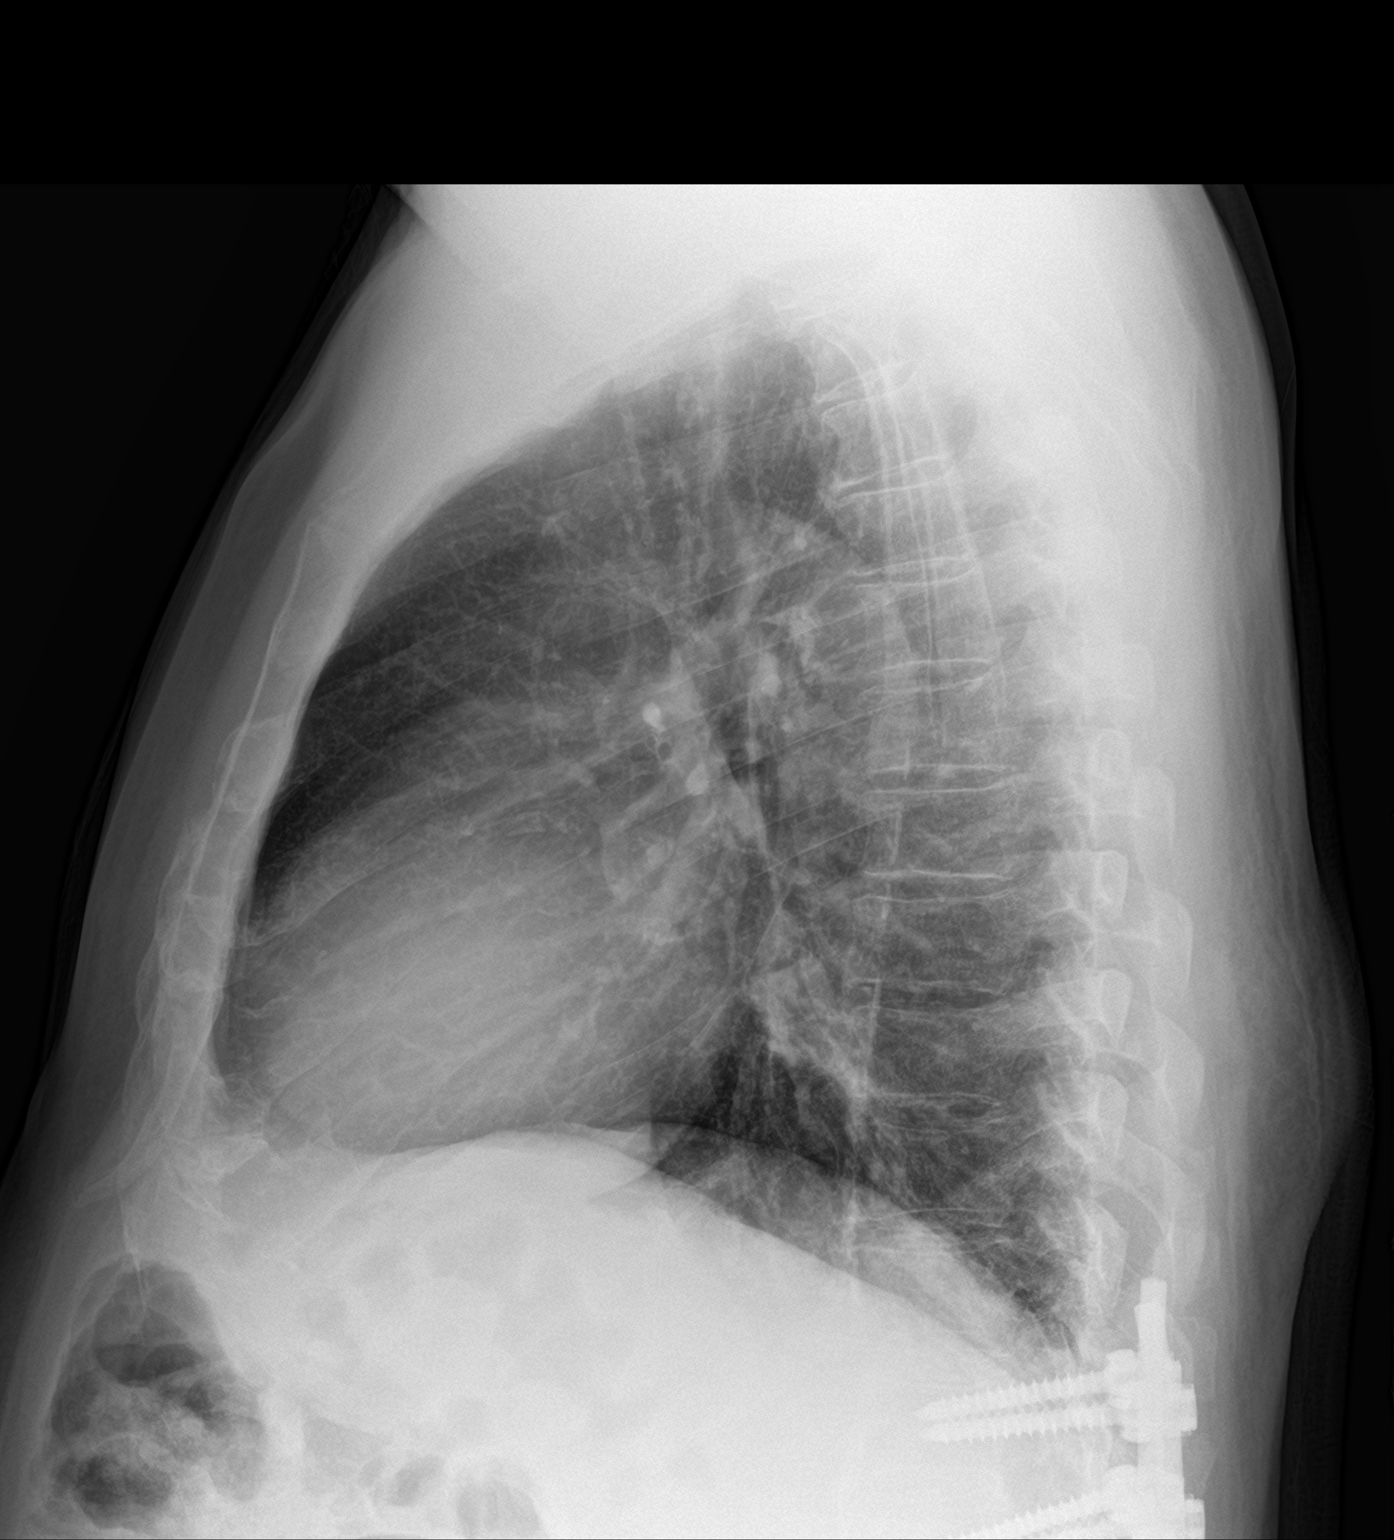

[2 of 2 positions shown; findings below may reference images not displayed]

FINDINGS: There is a new focal area of density at the left lung base
laterally. There is also an ill-defined area of streaky density at
the left lung apex. This could be within the lung or could be a
lesion of the anterior aspect of the left first rib.

There is a tiny nodular density at the right lung base laterally
which appears unchanged since [DT] and probably represents a nipple
shadow.

No effusions.

Heart size is normal. Vascularity is normal.
IMPRESSION: New focal area of density at the left lung base laterally. New area
of ill-defined density at the left lung apex. These could be
inflammatory. However, particularly given the unusual appearance in
the left lung apex, I recommend CT scan of the chest with contrast
for further evaluation.

## 2018-09-01 MED ORDER — MECLIZINE HCL 25 MG PO TABS
25.0000 mg | ORAL_TABLET | Freq: Three times a day (TID) | ORAL | Status: DC | PRN
  Filled 2018-09-01: qty 1

## 2018-09-01 MED ORDER — ENALAPRIL MALEATE 20 MG PO TABS
20.0000 mg | ORAL_TABLET | Freq: Every day | ORAL | Status: DC
  Administered 2018-09-02 – 2018-09-03 (×2): 20 mg via ORAL
  Filled 2018-09-01 (×2): qty 1

## 2018-09-01 MED ORDER — ACETAMINOPHEN 325 MG PO TABS
650.0000 mg | ORAL_TABLET | Freq: Four times a day (QID) | ORAL | Status: DC | PRN
  Administered 2018-09-02: 650 mg via ORAL
  Filled 2018-09-01 (×2): qty 2

## 2018-09-01 MED ORDER — ONDANSETRON HCL 4 MG PO TABS: 4.0000 mg | ORAL_TABLET | Freq: Four times a day (QID) | ORAL | Status: DC | PRN

## 2018-09-01 MED ORDER — INSULIN ASPART 100 UNIT/ML ~~LOC~~ SOLN
10.0000 [IU] | Freq: Once | SUBCUTANEOUS | Status: AC
  Administered 2018-09-01: 10 [IU] via SUBCUTANEOUS
  Filled 2018-09-01: qty 1

## 2018-09-01 MED ORDER — ENOXAPARIN SODIUM 40 MG/0.4ML ~~LOC~~ SOLN
40.0000 mg | SUBCUTANEOUS | Status: DC
  Administered 2018-09-01 – 2018-09-02 (×2): 40 mg via SUBCUTANEOUS
  Filled 2018-09-01 (×2): qty 0.4

## 2018-09-01 MED ORDER — ONDANSETRON HCL 4 MG/2ML IJ SOLN: 4.0000 mg | Freq: Four times a day (QID) | INTRAMUSCULAR | Status: DC | PRN

## 2018-09-01 MED ORDER — SODIUM CHLORIDE 0.9 % IV SOLN
1.0000 g | INTRAVENOUS | Status: DC
  Administered 2018-09-02: 1 g via INTRAVENOUS
  Filled 2018-09-01: qty 1
  Filled 2018-09-01: qty 10

## 2018-09-01 MED ORDER — AMLODIPINE BESYLATE 10 MG PO TABS
10.0000 mg | ORAL_TABLET | Freq: Every day | ORAL | Status: DC
  Administered 2018-09-01 – 2018-09-03 (×3): 10 mg via ORAL
  Filled 2018-09-01 (×4): qty 1

## 2018-09-01 MED ORDER — SENNOSIDES-DOCUSATE SODIUM 8.6-50 MG PO TABS: 1.0000 | ORAL_TABLET | Freq: Every evening | ORAL | Status: DC | PRN

## 2018-09-01 MED ORDER — IOHEXOL 300 MG/ML  SOLN
75.0000 mL | Freq: Once | INTRAMUSCULAR | Status: AC | PRN
  Administered 2018-09-01: 75 mL via INTRAVENOUS
  Filled 2018-09-01: qty 75

## 2018-09-01 MED ORDER — SODIUM CHLORIDE 0.9 % IV SOLN
INTRAVENOUS | Status: DC
  Administered 2018-09-01: 21:00:00 via INTRAVENOUS

## 2018-09-01 MED ORDER — INSULIN ASPART 100 UNIT/ML ~~LOC~~ SOLN
0.0000 [IU] | Freq: Every day | SUBCUTANEOUS | Status: DC
  Administered 2018-09-02: 4 [IU] via SUBCUTANEOUS
  Filled 2018-09-01: qty 1

## 2018-09-01 MED ORDER — SODIUM CHLORIDE 0.9 % IV SOLN
500.0000 mg | INTRAVENOUS | Status: DC
  Administered 2018-09-01 – 2018-09-02 (×2): 500 mg via INTRAVENOUS
  Filled 2018-09-01 (×3): qty 500

## 2018-09-01 MED ORDER — INSULIN ASPART 100 UNIT/ML ~~LOC~~ SOLN
0.0000 [IU] | Freq: Three times a day (TID) | SUBCUTANEOUS | Status: DC
  Administered 2018-09-02 (×2): 5 [IU] via SUBCUTANEOUS
  Administered 2018-09-02 – 2018-09-03 (×3): 8 [IU] via SUBCUTANEOUS
  Filled 2018-09-01 (×5): qty 1

## 2018-09-01 MED ORDER — ATORVASTATIN CALCIUM 20 MG PO TABS
40.0000 mg | ORAL_TABLET | Freq: Every day | ORAL | Status: DC
  Administered 2018-09-01 – 2018-09-02 (×2): 40 mg via ORAL
  Filled 2018-09-01 (×2): qty 2

## 2018-09-01 MED ORDER — METFORMIN HCL ER 500 MG PO TB24
500.0000 mg | ORAL_TABLET | Freq: Two times a day (BID) | ORAL | Status: DC
  Administered 2018-09-02: 500 mg via ORAL
  Filled 2018-09-01 (×2): qty 1

## 2018-09-01 MED ORDER — ASPIRIN EC 325 MG PO TBEC
325.0000 mg | DELAYED_RELEASE_TABLET | Freq: Every day | ORAL | Status: DC
  Administered 2018-09-02 – 2018-09-03 (×2): 325 mg via ORAL
  Filled 2018-09-01 (×3): qty 1

## 2018-09-01 MED ORDER — TIZANIDINE HCL 2 MG PO TABS
2.0000 mg | ORAL_TABLET | Freq: Three times a day (TID) | ORAL | Status: DC
  Administered 2018-09-01 – 2018-09-02 (×2): 2 mg via ORAL
  Filled 2018-09-01 (×7): qty 1

## 2018-09-01 MED ORDER — ACETAMINOPHEN 650 MG RE SUPP: 650.0000 mg | Freq: Four times a day (QID) | RECTAL | Status: DC | PRN

## 2018-09-01 MED ORDER — SODIUM CHLORIDE 0.9 % IV SOLN
1.0000 g | Freq: Once | INTRAVENOUS | Status: AC
  Administered 2018-09-01: 1 g via INTRAVENOUS
  Filled 2018-09-01: qty 10

## 2018-09-01 NOTE — ED Notes (Signed)
Pt taken to the floor by this EDT. Pt clean and dry. Floor called prior to leaving the department.

## 2018-09-01 NOTE — ED Triage Notes (Signed)
Pt in via POV with complaints of cough, runny nose x approximately one week.  States, "it wont let up."  Pt hypertensive upon arrival, states, "I forgot to take my BP pill today." other vitals WDL.  Ambulatory to triage, NAD noted at this time.

## 2018-09-01 NOTE — H&P (Signed)
[facility] NAME: Victor Fox    MR#:  [id number]  DATE OF BIRTH:  April 20, 1946  DATE OF ADMISSION:  09/01/2018  PRIMARY CARE PHYSICIAN: [name], MD   REQUESTING/REFERRING PHYSICIAN:   CHIEF COMPLAINT:   Chief Complaint  Patient presents with  . Cough  . URI    HISTORY OF PRESENT ILLNESS: Victor Fox  is a 73 y.o. male with a known history of with history of diabetes mellitus type 2, CVA, carotid artery disease, hyperlipidemia, hypertension, neuropathy, vitamin D deficiency presented to the emergency room for cough, difficulty breathing.  Patient has generalized weakness.  Cough and shortness of breath going on for the last 2 to 3 days.  Patient lives in a trailer.  No recent travel.  He was evaluated in the emergency room with chest x-ray which showed multilobar pneumonia.  Started on IV antibiotics.  Flu test pending.  PAST MEDICAL HISTORY:   Past Medical History:  Diagnosis Date  . Arthritis   . Benign neoplasm of unspecified site   . Carotid artery disease ([place])    a. carotid duplex 05/2015: less tahn 39% stenosis bilaterally, stable over the past year, recommend repeat imaging in 2 years (05/2017)  . Compression fracture    a. L2  . CVA (cerebral vascular accident) ([place]) 06/2012  . Diabetes mellitus without complication ([place])   . Heart murmur    a. echo 06/2012: EF >55%, LVH, aortic valve sclerosis  . Hyperlipidemia   . Hypertension   . Neuropathy   . Tobacco abuse    a. smoked x 25 years  . Vitamin D deficiency     PAST SURGICAL HISTORY:  Past Surgical History:  Procedure Laterality Date  . ARTHRODESIS    . BACK SURGERY     x 2  . discectomy    . trigger release      SOCIAL HISTORY:  Social History   Tobacco Use  . Smoking status: Former Smoker    Packs/day: 1.00    Years: 10.00    Pack years: 10.00    Types: Cigarettes  . Smokeless tobacco: Never Used  Substance Use Topics  . Alcohol  use: No    FAMILY HISTORY:  Family History  Problem Relation Age of Onset  . Heart disease Brother   . Hypertension Mother     DRUG ALLERGIES: No Known Allergies  REVIEW OF SYSTEMS:   CONSTITUTIONAL: No fever,has fatigue and weakness.  EYES: No blurred or double vision.  EARS, NOSE, AND THROAT: No tinnitus or ear pain.  RESPIRATORY: Has cough, shortness of breath, no wheezing or hemoptysis.  CARDIOVASCULAR: No chest pain, orthopnea, edema.  GASTROINTESTINAL: No nausea, vomiting, diarrhea or abdominal pain.  GENITOURINARY: No dysuria, hematuria.  ENDOCRINE: No polyuria, nocturia,  HEMATOLOGY: No anemia, easy bruising or bleeding SKIN: No rash or lesion. MUSCULOSKELETAL: No joint pain or arthritis.   NEUROLOGIC: No tingling, numbness, weakness.  PSYCHIATRY: No anxiety or depression.   MEDICATIONS AT HOME:  Prior to Admission medications   Medication Sig Start Date End Date Taking? Authorizing Provider  amLODipine (NORVASC) 10 MG tablet Take 1 tablet (10 mg total) by mouth daily. Appointment needed please for further refills 07/05/18  Yes [name], [name], MD  aspirin 325 MG tablet Take 325 mg by mouth daily.   Yes [provider]  atorvastatin (LIPITOR) 40 MG tablet Take 1 tablet (40 mg total) by mouth at bedtime. 01/05/18  Yes [name],  [name], MD  B-D UF III MINI PEN NEEDLES 31G X 5 MM MISC use once daily Patient taking differently: Patient is using twice daily 01/28/17  Yes Rochel Brome A, MD  enalapril (VASOTEC) 20 MG tablet Take 1 tablet (20 mg total) by mouth daily. 12/17/17  Yes Poulose, Bethel Born, NP  FREESTYLE PRECISION NEO TEST test strip Use as Directed to check Blood Glucose daily Patient taking differently: Use as Directed to check Blood Glucose twice daily 11/16/15  Yes Roselee Nova, MD  hydrochlorothiazide (HYDRODIURIL) 25 MG tablet Take 0.5 tablets (12.5 mg total) by mouth daily. 03/08/18  Yes [name], [name], MD  Insulin Degludec-Liraglutide (XULTOPHY)  100-3.6 UNIT-MG/ML SOPN Inject 24 Units into the skin daily. Patient taking differently: Inject 20 Units into the skin daily.  03/08/18  Yes [name], [name], MD  meclizine (ANTIVERT) 25 MG tablet Take 1 tablet (25 mg total) by mouth 3 (three) times daily as needed for dizziness. 12/17/17  Yes Poulose, Bethel Born, NP  metFORMIN (GLUCOPHAGE-XR) 500 MG 24 hr tablet TAKE 1 TABLET(500 MG) BY MOUTH TWICE DAILY 07/19/18  Yes [name], [name], MD  tiZANidine (ZANAFLEX) 2 MG tablet Take 2 mg by mouth 3 (three) times daily. 01/19/18  Yes [provider]  Blood Glucose Monitoring Suppl (FREESTYLE PRECISION NEO SYSTEM) w/Device KIT  10/18/15   [provider]  Lancets (FREESTYLE) lancets  10/18/15   [provider]      PHYSICAL EXAMINATION:   VITAL SIGNS: Blood pressure (!) 163/106, pulse 76, temperature 98.4 F (36.9 C), temperature source Oral, resp. rate 18, height 6' (1.829 m), weight 102.1 kg, SpO2 94 %.  GENERAL:  73 y.o.-year-old patient lying in the bed with no acute distress.  EYES: Pupils equal, round, reactive to light and accommodation. No scleral icterus. Extraocular muscles intact.  HEENT: Head atraumatic, normocephalic. Oropharynx and nasopharynx clear.  NECK:  Supple, no jugular venous distention. No thyroid enlargement, no tenderness.  LUNGS: Decreased breath sounds bilaterally, rales heard in the left lung. No use of accessory muscles of respiration.  CARDIOVASCULAR: S1, S2 normal. No murmurs, rubs, or gallops.  ABDOMEN: Soft, nontender, nondistended. Bowel sounds present. No organomegaly or mass.  EXTREMITIES: No pedal edema, cyanosis, or clubbing.  NEUROLOGIC: Cranial nerves II through XII are intact. Muscle strength 5/5 in all extremities. Sensation intact. Gait not checked.  PSYCHIATRIC: The patient is alert and oriented x 3.  SKIN: No obvious rash, lesion, or ulcer.   LABORATORY PANEL:   CBC Recent Labs  Lab 09/01/18 1535  WBC 11.7*  HGB 13.3  HCT  39.3  PLT 232  MCV 83.3  MCH 28.2  MCHC 33.8  RDW 12.4  LYMPHSABS 2.3  MONOABS 0.8  EOSABS 0.2  BASOSABS 0.0   ------------------------------------------------------------------------------------------------------------------  Chemistries  Recent Labs  Lab 09/01/18 1535  NA 134*  K 3.8  CL 100  CO2 28  GLUCOSE 368*  BUN 12  CREATININE 0.80  CALCIUM 8.8*   ------------------------------------------------------------------------------------------------------------------ estimated creatinine clearance is 104.7 mL/min (by C-G formula based on SCr of 0.8 mg/dL). ------------------------------------------------------------------------------------------------------------------ No results for input(s): TSH, T4TOTAL, T3FREE, THYROIDAB in the last 72 hours.  Invalid input(s): FREET3   Coagulation profile No results for input(s): INR, PROTIME in the last 168 hours. ------------------------------------------------------------------------------------------------------------------- No results for input(s): DDIMER in the last 72 hours. -------------------------------------------------------------------------------------------------------------------  Cardiac Enzymes No results for input(s): CKMB, TROPONINI, MYOGLOBIN in the last 168 hours.  Invalid input(s): CK ------------------------------------------------------------------------------------------------------------------ Invalid input(s): POCBNP  ---------------------------------------------------------------------------------------------------------------  Urinalysis    Component Value Date/Time   COLORURINE Yellow 05/02/2013 1443   APPEARANCEUR Clear 05/02/2013 1443   LABSPEC 1.017 05/02/2013 1443   PHURINE 5.0 05/02/2013 1443   GLUCOSEU Negative 05/02/2013 1443   HGBUR Negative 05/02/2013 1443   BILIRUBINUR Negative 05/02/2013 1443   KETONESUR Negative 05/02/2013 1443   PROTEINUR Negative 05/02/2013 1443   NITRITE  Negative 05/02/2013 1443   LEUKOCYTESUR Negative 05/02/2013 1443     RADIOLOGY: Dg Chest 2 View  Result Date: 09/01/2018 CLINICAL DATA:  Productive cough and chest pain. EXAM: CHEST - 2 VIEW COMPARISON:  07/08/2012, 07/06/2012 and 07/26/2009 FINDINGS: There is a new focal area of density at the left lung base laterally. There is also an ill-defined area of streaky density at the left lung apex. This could be within the lung or could be a lesion of the anterior aspect of the left first rib. There is a tiny nodular density at the right lung base laterally which appears unchanged since 2010 and probably represents a nipple shadow. No effusions. Heart size is normal. Vascularity is normal. IMPRESSION: New focal area of density at the left lung base laterally. New area of ill-defined density at the left lung apex. These could be inflammatory. However, particularly given the unusual appearance in the left lung apex, I recommend CT scan of the chest with contrast for further evaluation. Electronically Signed   By: Lorriane Shire M.D.   On: 09/01/2018 15:07   Ct Chest W Contrast  Result Date: 09/01/2018 CLINICAL DATA:  73 year old male with history of productive cough and mid chest pain for the past 2 weeks. EXAM: CT CHEST WITH CONTRAST TECHNIQUE: Multidetector CT imaging of the chest was performed during intravenous contrast administration. CONTRAST:  47m OMNIPAQUE IOHEXOL 300 MG/ML  SOLN COMPARISON:  None. FINDINGS: Cardiovascular: Heart size is normal. There is no significant pericardial fluid, thickening or pericardial calcification. There is aortic atherosclerosis, as well as atherosclerosis of the great vessels of the mediastinum and the coronary arteries, including calcified atherosclerotic plaque in the left main, left anterior descending, left circumflex and right coronary arteries. Severe calcifications of the aortic valve. Moderate calcifications of the mitral annulus. Mediastinum/Nodes: No  pathologically enlarged mediastinal or hilar lymph nodes. Esophagus is unremarkable in appearance. No axillary lymphadenopathy. Lungs/Pleura: Airspace consolidation in the periphery of the left lower lobe. Multiple other areas of branching density corresponding to mucoid impacted bronchi, most evident in the left upper lobe near the apex and in the posterior aspect of the right lower lobe. Patchy areas of ground-glass attenuation and septal thickening also noted, likely to reflect areas of acute inflammation/infection, most evident in the medial aspect of the right lower lobe. A few scattered pulmonary nodules are noted, largest of which measures up to 8 mm in the right lower lobe (axial image 111 of series 3). Upper Abdomen: Aortic atherosclerosis. Subcentimeter low-attenuation lesion in the upper pole the left kidney, too small to characterize, but statistically likely a cyst. Musculoskeletal: Orthopedic fixation hardware in the lower thoracic spine and upper lumbar spine, incompletely imaged. There are no aggressive appearing lytic or blastic lesions noted in the visualized portions of the skeleton. IMPRESSION: 1. Multilobar bronchopneumonia, most confluent in the left lower lobe. In addition, there are extensive areas of mucoid impaction, most evident in the left upper lobe and right lower lobe. Follow-up radiographs are recommended to ensure complete resolution of these findings, as the possibility of a centrally obstructing lesion (particularly in the right lower lobe) is not  entirely excluded. 2. Multiple small pulmonary nodules measuring up to 8 mm in the right lower lobe. Non-contrast chest CT at 3-6 months is recommended. If the nodules are stable at time of repeat CT, then future CT at 18-24 months (from today's scan) is considered optional for low-risk patients, but is recommended for high-risk patients. This recommendation follows the consensus statement: Guidelines for Management of Incidental  Pulmonary Nodules Detected on CT Images: From the Fleischner Society 2017; Radiology 2017; 284:228-243. 3. Aortic atherosclerosis, in addition to left main and 3 vessel coronary artery disease. Assessment for potential risk factor modification, dietary therapy or pharmacologic therapy may be warranted, if clinically indicated. 4. There are calcifications of the aortic valve. Echocardiographic correlation for evaluation of potential valvular dysfunction may be warranted if clinically indicated. Aortic Atherosclerosis (ICD10-I70.0). Electronically Signed   By: Vinnie Langton M.D.   On: 09/01/2018 16:50    EKG: Orders placed or performed in visit on 07/31/16  . EKG 12-Lead    IMPRESSION AND PLAN:  73 year old male patient with a known history of with history of diabetes mellitus type 2, CVA, carotid artery disease, hyperlipidemia, hypertension, neuropathy, vitamin D deficiency presented to the emergency room for cough, shortness of breath  -Community-acquired pneumonia Admit patient to medical floor Start patient on IV Rocephin and Zithromax antibiotics Follow-up cultures  -Type 2 diabetes mellitus Diabetic diet Start oral metformin Sliding scale coverage with insulin  -Hypertension Continue ACE inhibitor PRN IV hydralazine for better control of blood pressure  -Carotid artery disease Continue aspirin  -Hyperlipidemia Continue statin medication  All the records are reviewed and case discussed with ED provider. Management plans discussed with the patient, family and they are in agreement.  CODE STATUS:Full code    TOTAL TIME TAKING CARE OF THIS PATIENT: 52 minutes.    Saundra Shelling M.D on 09/01/2018 at 7:00 PM  Between 7am to 6pm - Pager - [phone]  After 6pm go to [email or web address] - password [email or web address] [facility]  Office  [phone]  CC: Primary care physician; [name], MD

## 2018-09-01 NOTE — ED Provider Notes (Signed)
[facility] Emergency Department Provider Note ____________________________________________  Time seen: 1445  I have reviewed the triage vital signs and the nursing notes.  HISTORY  Chief Complaint  Cough and URI  HPI Victor Fox is a 73 y.o. male with a history of hypertension, CVA, and aortic stenosis, presents for 1-1/2 to 2-week complaint of persistent cough, cough induced vomiting, and runny nose.  Patient reports some intermittent chills but denies any frank fevers.  He reports new household contacts as he is allowed a family who was recently displaced, to stay in his home.  He is a non-smoker, but 1 of his new hospital residence is a smoker.  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  He denies any hemoptysis, wheezing, chest pain, or shortness of breath.  Past Medical History:  Diagnosis Date  . Arthritis   . Benign neoplasm of unspecified site   . Carotid artery disease ([place])    a. carotid duplex 05/2015: less tahn 39% stenosis bilaterally, stable over the past year, recommend repeat imaging in 2 years (05/2017)  . Compression fracture    a. L2  . CVA (cerebral vascular accident) ([place]) 06/2012  . Diabetes mellitus without complication ([place])   . Heart murmur    a. echo 06/2012: EF >55%, LVH, aortic valve sclerosis  . Hyperlipidemia   . Hypertension   . Neuropathy   . Tobacco abuse    a. smoked x 25 years  . Vitamin D deficiency     Patient Active Problem List   Diagnosis Date Noted  . Overweight (BMI 25.0-29.9) 03/10/2018  . Aortic stenosis, mild 01/05/2018  . 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 Vertigo 12/17/2017  . Uncontrolled type 2 diabetes mellitus without complication, with long-term current use of insulin ([place]) 12/17/2017  . Hyperlipidemia 04/14/2016  . Pain and swelling of left wrist 07/10/2015  . Carotid artery disease ([place])   . Heart murmur 10/03/2013  . HTN (hypertension) 10/03/2013  . CVA  (cerebral vascular accident) ([place]) 10/03/2013  . Carotid artery stenosis 10/03/2013    Past Surgical History:  Procedure Laterality Date  . ARTHRODESIS    . BACK SURGERY     x 2  . discectomy    . trigger release      Prior to Admission medications   Medication Sig Start Date End Date Taking? Authorizing Provider  amLODipine (NORVASC) 10 MG tablet Take 1 tablet (10 mg total) by mouth daily. Appointment needed please for further refills 07/05/18   [name], [name], MD  aspirin 325 MG tablet Take 325 mg by mouth daily.    [provider]  atorvastatin (LIPITOR) 40 MG tablet Take 1 tablet (40 mg total) by mouth at bedtime. 01/05/18   [name], MD  B-D UF III MINI PEN NEEDLES 31G X 5 MM MISC use once daily 01/28/17   Roselee Nova, MD  Blood Glucose Monitoring Suppl (FREESTYLE PRECISION NEO SYSTEM) w/Device KIT  10/18/15   [provider]  enalapril (VASOTEC) 20 MG tablet Take 1 tablet (20 mg total) by mouth daily. 12/17/17   Poulose, Bethel Born, NP  FREESTYLE PRECISION NEO TEST test strip Use as Directed to check Blood Glucose daily 11/16/15   Roselee Nova, MD  hydrochlorothiazide (HYDRODIURIL) 25 MG tablet Take 0.5 tablets (12.5 mg total) by mouth daily. 03/08/18   [name], [name], MD  Insulin Degludec-Liraglutide (XULTOPHY) 100-3.6 UNIT-MG/ML SOPN Inject 24 Units into the skin daily. 03/08/18  [name], MD  Lancets (FREESTYLE) lancets  10/18/15   [provider]  meclizine (ANTIVERT) 25 MG tablet Take 1 tablet (25 mg total) by mouth 3 (three) times daily as needed for dizziness. 12/17/17   Poulose, Bethel Born, NP  metFORMIN (GLUCOPHAGE-XR) 500 MG 24 hr tablet TAKE 1 TABLET(500 MG) BY MOUTH TWICE DAILY 07/19/18   [name], [name], MD  tiZANidine (ZANAFLEX) 2 MG tablet Take 2 mg by mouth 3 (three) times daily. 01/19/18   [provider]    Allergies Patient has no known allergies.  Family History  Problem Relation Age of Onset  . Heart  disease Brother   . Hypertension Mother     Social History Social History   Tobacco Use  . Smoking status: Former Smoker    Packs/day: 1.00    Years: 10.00    Pack years: 10.00    Types: Cigarettes  . Smokeless tobacco: Never Used  Substance Use Topics  . Alcohol use: No  . Drug use: No    Review of Systems  Constitutional: Negative for fever. Eyes: Negative for visual changes. ENT: Negative for sore throat.  Reports sinus drainage. Cardiovascular: Negative for chest pain. Respiratory: Negative for shortness of breath.  Reports cough as above. Gastrointestinal: Negative for abdominal pain, vomiting and diarrhea. Genitourinary: Negative for dysuria. Musculoskeletal: Negative for back pain. Skin: Negative for rash. Neurological: Negative for headaches, focal weakness or numbness. ____________________________________________  PHYSICAL EXAM:  VITAL SIGNS: ED Triage Vitals [09/01/18 1353]  Enc Vitals Group     BP (!) 163/106     Pulse Rate 76     Resp 18     Temp 98.4 F (36.9 C)     Temp Source Oral     SpO2 94 %     Weight 225 lb (102.1 kg)     Height 6' (1.829 m)     Head Circumference      Peak Flow      Pain Score 0     Pain Loc      Pain Edu?      Excl. in [place]?     Constitutional: Alert and oriented. Well appearing and in no distress. Head: Normocephalic and atraumatic. Eyes: Conjunctivae are normal. PERRL. Normal extraocular movements Ears: Canals clear. TMs intact bilaterally. Nose: No congestion/rhinorrhea/epistaxis. Mouth/Throat: Mucous membranes are moist. Neck: Supple. No thyromegaly. Hematological/Lymphatic/Immunological: No cervical lymphadenopathy. Cardiovascular: Normal rate, regular rhythm. Normal distal pulses. Respiratory: Normal respiratory effort. No wheezes/rales/rhonchi. Intermittent cough noted.  Gastrointestinal: Soft and nontender. No distention. Cough-induced vomiting reported.  Musculoskeletal: Nontender with normal range of  motion in all extremities.  Neurologic:  Normal gait without ataxia. Normal speech and language. No gross focal neurologic deficits are appreciated. Skin:  Skin is warm, dry and intact. No rash noted. ____________________________________________   LABS (pertinent positives/negatives) Labs Reviewed  CBC WITH DIFFERENTIAL/PLATELET - Abnormal; Notable for the following components:      Result Value   WBC 11.7 (*)    Neutro Abs 8.4 (*)    All other components within normal limits  BASIC METABOLIC PANEL - Abnormal; Notable for the following components:   Sodium 134 (*)    Glucose, Bld 368 (*)    Calcium 8.8 (*)    All other components within normal limits  INFLUENZA PANEL BY PCR (TYPE A & B)  ____________________________________________   RADIOLOGY  CXR  IMPRESSION: New focal area of density at the left lung base laterally. New area of ill-defined density at  the left lung apex. These could be inflammatory. However, particularly given the unusual appearance in the left lung apex, I recommend CT scan of the chest with contrast for further evaluation  CT Chest w/ CM  Multilobar pneumonia Multiple pulmonary nodules upto 90m RLL Aortic Atherosclerosis/CAD ____________________________________________  PROCEDURES  Procedures Rocephin 1 g IVP ____________________________________________  INITIAL IMPRESSION / ASSESSMENT AND PLAN / ED COURSE  Patient with ED evaluation of cough x 2 weeks. He is found to have an equivocal CXR with changes from previous. CT chest w/ CM confirms multilobar pneumonia with recommendation for repeat CT in 3-6 months. Patient is agreeable to plan of admission for IV antibiotics.   ----------------------------------------- 6:36 PM on 09/01/2018 ----------------------------------------- S/w my attending Quale, MD for plan of admission. He will see the patient ahead of the hospitalist.   ----------------------------------------- 6:47 PM on  09/01/2018 ----------------------------------------- Hospitalist accepts patient and requests influenza swab to plan for admission with isolation if necessary. (see consult note) ____________________________________________  FINAL CLINICAL IMPRESSION(S) / ED DIAGNOSES  Final diagnoses:  Multilobar lung infiltrate  Community acquired pneumonia, unspecified laterality      MMelvenia Needles PA-C 09/01/18 1850    QDelman Kitten MD 09/01/18 2121

## 2018-09-01 NOTE — ED Provider Notes (Signed)
Medical screening examination/treatment/procedure(s) were conducted as a shared visit with non-physician practitioner(s) and myself.  I personally evaluated the patient during the encounter.   Saw and evaluated the patient, discussed with him and reviewed his CT scan.  He does have multilobar pneumonia on CT, for this reason will admit for further care and work-up.  He is awake and alert reports a productive cough for a week or 2.  Right lung clear to examination, some rales on the left lower lobe.  He is respirating comfortably at this time.  Appears appropriate for admission   Delman Kitten, MD 09/01/18 Bosie Helper

## 2018-09-01 NOTE — Progress Notes (Signed)
Advanced care plan.  Purpose of the Encounter: CODE STATUS  Parties in Attendance: Patient  Patient's Decision Capacity: Good  Subjective/Patient's story: Presented to the emergency room for cough, shortness of breath and weakness   Objective/Medical story Patient was evaluated in the emergency room Multilobar pneumonia Needs evaluation for flu and IV antibiotics   Goals of care determination:  Advance care directives goals of care and treatment plan discussed Patient wants everything done which includes CPR, intubation ventilator if the need arises   CODE STATUS: Full code   Time spent discussing advanced care planning: 16 minutes

## 2018-09-01 NOTE — ED Notes (Signed)
Admitting MD at bedside.

## 2018-09-01 NOTE — ED Notes (Signed)
Awaiting ct results  conts to have cough

## 2018-09-02 ENCOUNTER — Inpatient Hospital Stay (HOSPITAL_COMMUNITY)
Admit: 2018-09-02 | Discharge: 2018-09-02 | Disposition: A | Discharge status: Home / Self Care | Payer: Medicare HMO | Attending: Internal Medicine | Admitting: Internal Medicine

## 2018-09-02 DIAGNOSIS — I351 Nonrheumatic aortic (valve) insufficiency: Secondary | ICD-10-CM

## 2018-09-02 LAB — CBC
HCT: 40.9 % (ref 39.0–52.0)
Hemoglobin: 13.6 g/dL (ref 13.0–17.0)
MCH: 28.2 pg (ref 26.0–34.0)
MCHC: 33.3 g/dL (ref 30.0–36.0)
MCV: 84.7 fL (ref 80.0–100.0)
Platelets: 245 10*3/uL (ref 150–400)
RBC: 4.83 MIL/uL (ref 4.22–5.81)
RDW: 12.3 % (ref 11.5–15.5)
WBC: 11.8 10*3/uL — ABNORMAL HIGH (ref 4.0–10.5)
nRBC: 0 % (ref 0.0–0.2)

## 2018-09-02 LAB — BASIC METABOLIC PANEL
Anion gap: 8 (ref 5–15)
BUN: 9 mg/dL (ref 8–23)
CO2: 27 mmol/L (ref 22–32)
Calcium: 8.6 mg/dL — ABNORMAL LOW (ref 8.9–10.3)
Chloride: 102 mmol/L (ref 98–111)
Creatinine, Ser: 0.76 mg/dL (ref 0.61–1.24)
GFR calc non Af Amer: >60 mL/min (ref >60)
Glucose, Bld: 239 mg/dL — ABNORMAL HIGH (ref 70–99)
Potassium: 3.6 mmol/L (ref 3.5–5.1)
Sodium: 137 mmol/L (ref 135–145)

## 2018-09-02 LAB — ECHOCARDIOGRAM COMPLETE
HEIGHTINCHES: 72 in
Weight: 3600 oz

## 2018-09-02 LAB — GLUCOSE, CAPILLARY
GLUCOSE-CAPILLARY: 302 mg/dL — AB (ref 70–99)
Glucose-Capillary: 229 mg/dL — ABNORMAL HIGH (ref 70–99)
Glucose-Capillary: 236 mg/dL — ABNORMAL HIGH (ref 70–99)
Glucose-Capillary: 239 mg/dL — ABNORMAL HIGH (ref 70–99)
Glucose-Capillary: 278 mg/dL — ABNORMAL HIGH (ref 70–99)

## 2018-09-02 MED ORDER — INFLUENZA VAC SPLIT HIGH-DOSE 0.5 ML IM SUSY
0.5000 mL | PREFILLED_SYRINGE | INTRAMUSCULAR | Status: AC
  Administered 2018-09-03: 0.5 mL via INTRAMUSCULAR
  Filled 2018-09-02: qty 0.5

## 2018-09-02 MED ORDER — INSULIN ASPART 100 UNIT/ML ~~LOC~~ SOLN
3.0000 [IU] | Freq: Three times a day (TID) | SUBCUTANEOUS | Status: DC
  Administered 2018-09-02 – 2018-09-03 (×3): 3 [IU] via SUBCUTANEOUS
  Filled 2018-09-02 (×3): qty 1

## 2018-09-02 MED ORDER — GUAIFENESIN-DM 100-10 MG/5ML PO SYRP
5.0000 mL | ORAL_SOLUTION | ORAL | Status: DC | PRN
  Administered 2018-09-02 – 2018-09-03 (×4): 5 mL via ORAL
  Filled 2018-09-02 (×4): qty 5

## 2018-09-02 MED ORDER — INSULIN GLARGINE 100 UNIT/ML ~~LOC~~ SOLN
10.0000 [IU] | Freq: Every day | SUBCUTANEOUS | Status: DC
  Administered 2018-09-02 – 2018-09-03 (×2): 10 [IU] via SUBCUTANEOUS
  Filled 2018-09-02 (×2): qty 0.1

## 2018-09-02 NOTE — Progress Notes (Signed)
Inpatient Diabetes Program Recommendations  AACE/ADA: New Consensus Statement on Inpatient Glycemic Control (2019)  Target Ranges:  Prepandial:   less than 140 mg/dL      Peak postprandial:   less than 180 mg/dL (1-2 hours)      Critically ill patients:  140 - 180 mg/dL   Results for HULBERT, BRANSCOME (MRN [id number]) as of 09/02/2018 10:05  Ref. Range 09/01/2018 21:10 09/02/2018 00:02 09/02/2018 08:29  Glucose-Capillary Latest Ref Range: 70 - 99 mg/dL 417 (H) 236 (H) 239 (H)   Review of Glycemic Control  Diabetes history: DM2 Outpatient Diabetes medications: Xultophy (Tresiba & Victoza) 100 units-3.6 mg 20 units daily, Metformin XR 500 mg BID Current orders for Inpatient glycemic control: Novolog 0-15 units TID with meals, Novolog 0-5 units QHS, Metformin XR 500 mg BID  Inpatient Diabetes Program Recommendations:  Insulin - Basal: Please consider ordering Lantus 10 units daily. Insulin - Meal Coverage: If post prandial glucose is consistently greater than 180 mg/dl, please consider ordering Novolog 3 units TID with meals for meal coverage if patient eats at least 50% of meals.  Thanks, Barnie Alderman, RN, MSN, CDE Diabetes Coordinator Inpatient Diabetes Program [phone] (Team Pager from 8am to 5pm)

## 2018-09-02 NOTE — Progress Notes (Signed)
[place] at [place] NAME: Victor Fox    MR#:  [id number]  DATE OF BIRTH:  May 25, 1946  SUBJECTIVE:  No new complaints overnight.  Shortness of breath improving.  Still has some intermittent cough. No fevers today.  REVIEW OF SYSTEMS:  Review of Systems  Constitutional: Negative for chills and fever.  HENT: Negative for ear pain and hearing loss.   Eyes: Negative for blurred vision and double vision.  Respiratory: Positive for cough and shortness of breath.   Cardiovascular: Negative for chest pain and palpitations.  Gastrointestinal: Negative for heartburn and nausea.  Genitourinary: Negative for dysuria and urgency.  Musculoskeletal: Negative for myalgias and neck pain.  Skin: Negative for itching and rash.  Neurological: Negative for dizziness and headaches.  Psychiatric/Behavioral: Negative for depression.      DRUG ALLERGIES:  No Known Allergies VITALS:  Blood pressure (!) 174/75, pulse 71, temperature 98.7 F (37.1 C), temperature source Oral, resp. rate 18, height 6' (1.829 m), weight 102.1 kg, SpO2 99 %. PHYSICAL EXAMINATION:  Physical Exam LABORATORY PANEL:  Male CBC Recent Labs  Lab 09/02/18 0329  WBC 11.8*  HGB 13.6  HCT 40.9  PLT 245   ------------------------------------------------------------------------------------------------------------------ Chemistries  Recent Labs  Lab 09/02/18 0329  NA 137  K 3.6  CL 102  CO2 27  GLUCOSE 239*  BUN 9  CREATININE 0.76  CALCIUM 8.6*   RADIOLOGY:  Dg Chest 2 View  Result Date: 09/01/2018 CLINICAL DATA:  Productive cough and chest pain. EXAM: CHEST - 2 VIEW COMPARISON:  07/08/2012, 07/06/2012 and 07/26/2009 FINDINGS: There is a new focal area of density at the left lung base laterally. There is also an ill-defined area of streaky density at the left lung apex. This could be within the lung or could be a lesion of the anterior aspect of the left first rib. There  is a tiny nodular density at the right lung base laterally which appears unchanged since 2010 and probably represents a nipple shadow. No effusions. Heart size is normal. Vascularity is normal. IMPRESSION: New focal area of density at the left lung base laterally. New area of ill-defined density at the left lung apex. These could be inflammatory. However, particularly given the unusual appearance in the left lung apex, I recommend CT scan of the chest with contrast for further evaluation. Electronically Signed   By: Lorriane Shire M.D.   On: 09/01/2018 15:07   Ct Chest W Contrast  Result Date: 09/01/2018 CLINICAL DATA:  73 year old male with history of productive cough and mid chest pain for the past 2 weeks. EXAM: CT CHEST WITH CONTRAST TECHNIQUE: Multidetector CT imaging of the chest was performed during intravenous contrast administration. CONTRAST:  36mL OMNIPAQUE IOHEXOL 300 MG/ML  SOLN COMPARISON:  None. FINDINGS: Cardiovascular: Heart size is normal. There is no significant pericardial fluid, thickening or pericardial calcification. There is aortic atherosclerosis, as well as atherosclerosis of the great vessels of the mediastinum and the coronary arteries, including calcified atherosclerotic plaque in the left main, left anterior descending, left circumflex and right coronary arteries. Severe calcifications of the aortic valve. Moderate calcifications of the mitral annulus. Mediastinum/Nodes: No pathologically enlarged mediastinal or hilar lymph nodes. Esophagus is unremarkable in appearance. No axillary lymphadenopathy. Lungs/Pleura: Airspace consolidation in the periphery of the left lower lobe. Multiple other areas of branching density corresponding to mucoid impacted bronchi, most evident in the left upper lobe near the apex and in the posterior aspect of the right  lower lobe. Patchy areas of ground-glass attenuation and septal thickening also noted, likely to reflect areas of acute  inflammation/infection, most evident in the medial aspect of the right lower lobe. A few scattered pulmonary nodules are noted, largest of which measures up to 8 mm in the right lower lobe (axial image 111 of series 3). Upper Abdomen: Aortic atherosclerosis. Subcentimeter low-attenuation lesion in the upper pole the left kidney, too small to characterize, but statistically likely a cyst. Musculoskeletal: Orthopedic fixation hardware in the lower thoracic spine and upper lumbar spine, incompletely imaged. There are no aggressive appearing lytic or blastic lesions noted in the visualized portions of the skeleton. IMPRESSION: 1. Multilobar bronchopneumonia, most confluent in the left lower lobe. In addition, there are extensive areas of mucoid impaction, most evident in the left upper lobe and right lower lobe. Follow-up radiographs are recommended to ensure complete resolution of these findings, as the possibility of a centrally obstructing lesion (particularly in the right lower lobe) is not entirely excluded. 2. Multiple small pulmonary nodules measuring up to 8 mm in the right lower lobe. Non-contrast chest CT at 3-6 months is recommended. If the nodules are stable at time of repeat CT, then future CT at 18-24 months (from today's scan) is considered optional for low-risk patients, but is recommended for high-risk patients. This recommendation follows the consensus statement: Guidelines for Management of Incidental Pulmonary Nodules Detected on CT Images: From the Fleischner Society 2017; Radiology 2017; 284:228-243. 3. Aortic atherosclerosis, in addition to left main and 3 vessel coronary artery disease. Assessment for potential risk factor modification, dietary therapy or pharmacologic therapy may be warranted, if clinically indicated. 4. There are calcifications of the aortic valve. Echocardiographic correlation for evaluation of potential valvular dysfunction may be warranted if clinically indicated. Aortic  Atherosclerosis (ICD10-I70.0). Electronically Signed   By: Vinnie Langton M.D.   On: 09/01/2018 16:50   ASSESSMENT AND PLAN:    73 year old male patient with a known history of with history of diabetes mellitus type 2, CVA, carotid artery disease, hyperlipidemia, hypertension, neuropathy, vitamin D deficiency presented to the emergency room for cough, shortness of breath  1.Community-acquired pneumonia Admitted patient to medical floor.  CT scan of the chest done reviewed multilobar bronchopneumonia.  Follow-up radiographs recommended to rule out possibility of obstructing lesion particularly in the right lower lobe.  Multiple small pulmonary nodules measuring up to 8 mm in the right lower lobe noted.  Recommendation is for noncontrast CT chest in 3 to 6 months.  There was also mention of calcifications of the aortic valve.  Echocardiogram recommended for further evaluation for potential valvular dysfunction.  2D echocardiogram requested. Patient currently on IV Rocephin and Zithromax antibiotics with plans for minimum of 7-day treatment duration and transition to p.o. antibiotics to complete treatment duration on discharge. Follow-up cultures Influenza test negative.  2.Type 2 diabetes mellitus Diabetic diet Metformin and liraglutide on hold. Placed on scheduled Lantus insulin 10 units subcu daily and pre-meal NovoLog insulin 3 units 3 times daily with meals during this admission.  Monitor blood sugars and adjust regimen as needed.  To resume home regimen on discharge from the hospital.  Also on sliding scale insulin coverage.  3. Hypertension Continue ACE inhibitor and Norvasc.  Monitor and adjust regimen as needed.  4.Carotid artery disease Continue aspirin and statins.  5.Hyperlipidemia Continue statin medication  DVT prophylaxis; Lovenox  CODE STATUS:Full code    All the records are reviewed and case discussed with Care Management/Social Worker. Management plans  discussed with the patient,  and he is in agreement.  CODE STATUS: Full Code  TOTAL TIME TAKING CARE OF THIS PATIENT: 37 minutes.   More than 50% of the time was spent in counseling/coordination of care: YES  POSSIBLE D/C IN 2 DAYS, DEPENDING ON CLINICAL CONDITION.   Daniell Paradise M.D on 09/02/2018 at 1:40 PM  Between 7am to 6pm - Pager - [phone]  After 6pm go to [email or web address] - Proofreader  [facility]  Office  [phone]  CC: Primary care physician; [name], MD  Note: This dictation was prepared with Dragon dictation along with smaller phrase technology. Any transcriptional errors that result from this process are unintentional.

## 2018-09-02 NOTE — Progress Notes (Signed)
*  PRELIMINARY RESULTS* Echocardiogram 2D Echocardiogram has been performed.  Sherrie Sport 09/02/2018, 3:03 PM

## 2018-09-03 LAB — PHOSPHORUS: Phosphorus: 3.8 mg/dL (ref 2.5–4.6)

## 2018-09-03 LAB — BASIC METABOLIC PANEL
Anion gap: 7 (ref 5–15)
BUN: 14 mg/dL (ref 8–23)
CO2: 29 mmol/L (ref 22–32)
CREATININE: 0.71 mg/dL (ref 0.61–1.24)
Calcium: 8.7 mg/dL — ABNORMAL LOW (ref 8.9–10.3)
Chloride: 100 mmol/L (ref 98–111)
GFR calc Af Amer: >60 mL/min (ref >60)
GFR calc non Af Amer: >60 mL/min (ref >60)
Glucose, Bld: 285 mg/dL — ABNORMAL HIGH (ref 70–99)
Potassium: 3.8 mmol/L (ref 3.5–5.1)
SODIUM: 136 mmol/L (ref 135–145)

## 2018-09-03 LAB — CBC
HCT: 39.8 % (ref 39.0–52.0)
Hemoglobin: 13.3 g/dL (ref 13.0–17.0)
MCH: 27.8 pg (ref 26.0–34.0)
MCHC: 33.4 g/dL (ref 30.0–36.0)
MCV: 83.1 fL (ref 80.0–100.0)
PLATELETS: 249 10*3/uL (ref 150–400)
RBC: 4.79 MIL/uL (ref 4.22–5.81)
RDW: 12.3 % (ref 11.5–15.5)
WBC: 11.3 10*3/uL — ABNORMAL HIGH (ref 4.0–10.5)
nRBC: 0 % (ref 0.0–0.2)

## 2018-09-03 LAB — GLUCOSE, CAPILLARY
GLUCOSE-CAPILLARY: 274 mg/dL — AB (ref 70–99)
Glucose-Capillary: 257 mg/dL — ABNORMAL HIGH (ref 70–99)

## 2018-09-03 LAB — MAGNESIUM: Magnesium: 2 mg/dL (ref 1.7–2.4)

## 2018-09-03 MED ORDER — GUAIFENESIN-DM 100-10 MG/5ML PO SYRP: 5.0000 mL | ORAL_SOLUTION | ORAL | 0 refills | Status: DC | PRN

## 2018-09-03 MED ORDER — AZITHROMYCIN 500 MG PO TABS: 500.0000 mg | ORAL_TABLET | Freq: Every day | ORAL | 0 refills | Status: AC

## 2018-09-03 NOTE — Care Management Important Message (Signed)
Copy of signed Medicare IM left with patient in room. 

## 2018-09-03 NOTE — Progress Notes (Signed)
Pt Discharged to home via his own vehicle. Pt is not on sedating medications and is able to drive himself home per the MD. Discharge instructions and medication regimen reviewed at bedside with patient. Pt. verbalizes understanding of instructions and medication regimen. Prescriptions with the pt. Patient assessment unchanged from this morning. VSS. IV discontinued per policy.

## 2018-09-03 NOTE — Care Management Note (Signed)
Case Management Note  Patient Details  Name: RUBIN DAIS MRN: [id number] Date of Birth: 03-Aug-1946  Subjective/Objective:                   Met  With patient to discuss discharge Patient lives with friends Patient uses [facility] in [place] Patient has PCP can't remember the name Patint drives and can get to appointments Patient has no HH needs and no DME needs  Action/Plan:   Expected Discharge Date:  09/03/18               Expected Discharge Plan:  Home/Self Care  In-House Referral:     Discharge planning Services  CM Consult  Post Acute Care Choice:    Choice offered to:     DME Arranged:    DME Agency:     HH Arranged:    [place] Agency:     Status of Service:  Completed, signed off  If discussed at [employer] of Stay Meetings, dates discussed:    Additional Comments:  Su Hilt, RN 09/03/2018, 11:55 AM

## 2018-09-03 NOTE — Progress Notes (Addendum)
Inpatient Diabetes Program Recommendations  AACE/ADA: New Consensus Statement on Inpatient Glycemic Control (2019)  Target Ranges:  Prepandial:   less than 140 mg/dL      Peak postprandial:   less than 180 mg/dL (1-2 hours)      Critically ill patients:  140 - 180 mg/dL   Results for Victor Fox, Victor Fox (MRN [id number]) as of 09/03/2018 10:48  Ref. Range 09/02/2018 08:29 09/02/2018 12:12 09/02/2018 16:58 09/02/2018 21:14 09/03/2018 08:39  Glucose-Capillary Latest Ref Range: 70 - 99 mg/dL 239 (H) 278 (H) 229 (H) 302 (H) 257 (H)   Review of Glycemic Control  Diabetes history: DM2 Outpatient Diabetes medications: Xultophy (Tresiba & Victoza) 100 units-3.6 mg 20 units daily, Metformin XR 500 mg BID Current orders for Inpatient glycemic control: Lantus 10 units daily, Novolog 3 units TID with meals for meal coverage, Novolog 0-15 units TID with meals, Novolog 0-5 units QHS  Inpatient Diabetes Program Recommendations:   Insulin - Basal: Please consider increasing Lantus to 20 units daily. If MD agreeable to increase, please order one time Lantus 10 units x 1 now (since pt has already received Lantus 10 units this morning).  Insulin - Meal Coverage: Please consider increaseing meal coverage to Novolog 5 units TID with meals if patient eats at least 50% of meals.  Thanks, Barnie Alderman, RN, MSN, CDE Diabetes Coordinator Inpatient Diabetes Program [phone] (Team Pager from 8am to 5pm)

## 2018-09-03 NOTE — Discharge Summary (Signed)
[place] at [place] NAME: Victor Fox    MR#:  [id number]  DATE OF BIRTH:  31-Jul-1946  DATE OF ADMISSION:  09/01/2018   ADMITTING PHYSICIAN: Saundra Shelling, MD  DATE OF DISCHARGE: 09/03/2018  PRIMARY CARE PHYSICIAN: [name], MD   ADMISSION DIAGNOSIS:  Community acquired pneumonia, unspecified laterality [J18.9] Multilobar lung infiltrate [R91.8] DISCHARGE DIAGNOSIS:  Active Problems:   CAP (community acquired pneumonia)  SECONDARY DIAGNOSIS:   Past Medical History:  Diagnosis Date  . Arthritis   . Benign neoplasm of unspecified site   . Carotid artery disease ([place])    a. carotid duplex 05/2015: less tahn 39% stenosis bilaterally, stable over the past year, recommend repeat imaging in 2 years (05/2017)  . Compression fracture    a. L2  . CVA (cerebral vascular accident) ([place]) 06/2012  . Diabetes mellitus without complication ([place])   . Heart murmur    a. echo 06/2012: EF >55%, LVH, aortic valve sclerosis  . Hyperlipidemia   . Hypertension   . Neuropathy   . Tobacco abuse    a. smoked x 25 years  . Vitamin D deficiency    HOSPITAL COURSE:   HISTORY OF PRESENT ILLNESS:  Victor Fox  is a 73 y.o. male with a known history of with history of diabetes mellitus type 2, CVA, carotid artery disease, hyperlipidemia, hypertension, neuropathy, vitamin D deficiency presented to the emergency room for cough, difficulty breathing.  Patient had generalized weakness.  Cough and shortness of breath going on for the last 2 to 3 days.  Patient lives in a trailer.  No recent travel.  He was evaluated in the emergency room with chest x-ray which showed multilobar pneumonia.  Started on IV antibiotics.  Patient admitted to medical service.  Please refer to the H&P dictated for further details.   HOSPITAL COURSE; 1.Community-acquired pneumonia Admitted patient to medical floor.  CT scan of the chest done reviewed multilobar  bronchopneumonia.  Follow-up radiographs recommended to rule out possibility of obstructing lesion particularly in the right lower lobe.  Multiple small pulmonary nodules measuring up to 8 mm in the right lower lobe noted.  Recommendation is for noncontrast CT chest in 3 to 6 months.  Patient to follow-up with primary care physician for repeat imaging. There was also mention of calcifications of the aortic valve.  Echocardiogram recommended for further evaluation for potential valvular dysfunction.    2D echocardiogram done revealed normal ejection fraction of 60 to 65%.  No wall motion abnormality.  Grade 1 diastolic dysfunction.  Aortic valve moderately calcified.  Mobility was not restricted. Patient was adequately treated with IV antibiotics with azithromycin and Rocephin.  Significantly improved clinically.  Influenza test negative.  Being discharged on p.o. antibiotics with azithromycin to complete treatment duration.  2.Type 2 diabetes mellitus Diabetic diet.  Patient to resume home diabetic regimen.  Follow-up with primary care physician for monitoring of blood sugars as outpatient  3. Hypertension Continue ACE inhibitor and Norvasc.    Monitoring by primary care physician   4.Carotid artery disease Continue aspirin and statins.  5.Hyperlipidemia Continue statin medication   DISCHARGE CONDITIONS:  Stable CONSULTS OBTAINED:   DRUG ALLERGIES:  No Known Allergies DISCHARGE MEDICATIONS:   Allergies as of 09/03/2018   No Known Allergies     Medication List    TAKE these medications   amLODipine 10 MG tablet Commonly known as:  NORVASC Take 1 tablet (10 mg total) by mouth daily.  Appointment needed please for further refills   aspirin 325 MG tablet Take 325 mg by mouth daily.   atorvastatin 40 MG tablet Commonly known as:  LIPITOR Take 1 tablet (40 mg total) by mouth at bedtime.   azithromycin 500 MG tablet Commonly known as:  ZITHROMAX Take 1 tablet (500 mg total) by  mouth daily for 3 days. Take 1 tablet daily for 3 days.   B-D UF III MINI PEN NEEDLES 31G X 5 MM Misc Generic drug:  Insulin Pen Needle use once daily What changed:    when to take this  additional instructions   enalapril 20 MG tablet Commonly known as:  VASOTEC Take 1 tablet (20 mg total) by mouth daily.   freestyle lancets   FREESTYLE PRECISION NEO SYSTEM w/Device Kit   FREESTYLE PRECISION NEO TEST test strip Generic drug:  glucose blood Use as Directed to check Blood Glucose daily What changed:  additional instructions   guaiFENesin-dextromethorphan 100-10 MG/5ML syrup Commonly known as:  ROBITUSSIN DM Take 5 mLs by mouth every 4 (four) hours as needed for cough. For 5 days.   hydrochlorothiazide 25 MG tablet Commonly known as:  HYDRODIURIL Take 0.5 tablets (12.5 mg total) by mouth daily.   Insulin Degludec-Liraglutide 100-3.6 UNIT-MG/ML Sopn Commonly known as:  XULTOPHY Inject 24 Units into the skin daily. What changed:  how much to take   meclizine 25 MG tablet Commonly known as:  ANTIVERT Take 1 tablet (25 mg total) by mouth 3 (three) times daily as needed for dizziness.   metFORMIN 500 MG 24 hr tablet Commonly known as:  GLUCOPHAGE-XR TAKE 1 TABLET(500 MG) BY MOUTH TWICE DAILY   tiZANidine 2 MG tablet Commonly known as:  ZANAFLEX Take 2 mg by mouth 3 (three) times daily.        DISCHARGE INSTRUCTIONS:   DIET:  Diabetic diet DISCHARGE CONDITION:  Stable ACTIVITY:  Activity as tolerated OXYGEN:  Home Oxygen: No.  Oxygen Delivery: room air DISCHARGE LOCATION:  home   If you experience worsening of your admission symptoms, develop shortness of breath, life threatening emergency, suicidal or homicidal thoughts you must seek medical attention immediately by calling 911 or calling your MD immediately  if symptoms less severe.  You Must read complete instructions/literature along with all the possible adverse reactions/side effects for all the  Medicines you take and that have been prescribed to you. Take any new Medicines after you have completely understood and accpet all the possible adverse reactions/side effects.   Please note  You were cared for by a hospitalist during your hospital stay. If you have any questions about your discharge medications or the care you received while you were in the hospital after you are discharged, you can call the unit and asked to speak with the hospitalist on call if the hospitalist that took care of you is not available. Once you are discharged, your primary care physician will handle any further medical issues. Please note that NO REFILLS for any discharge medications will be authorized once you are discharged, as it is imperative that you return to your primary care physician (or establish a relationship with a primary care physician if you do not have one) for your aftercare needs so that they can reassess your need for medications and monitor your lab values.    On the day of Discharge:  VITAL SIGNS:  Blood pressure (!) 164/67, pulse 63, temperature 98.1 F (36.7 C), temperature source Oral, resp. rate 18, height 6' (1.829 m),  weight 102.1 kg, SpO2 96 %. PHYSICAL EXAMINATION:  GENERAL:  73 y.o.-year-old patient lying in the bed with no acute distress.  EYES: Pupils equal, round, reactive to light and accommodation. No scleral icterus. Extraocular muscles intact.  HEENT: Head atraumatic, normocephalic. Oropharynx and nasopharynx clear.  NECK:  Supple, no jugular venous distention. No thyroid enlargement, no tenderness.  LUNGS: Normal breath sounds bilaterally, no wheezing, rales,rhonchi or crepitation. No use of accessory muscles of respiration.  CARDIOVASCULAR: S1, S2 normal. No murmurs, rubs, or gallops.  ABDOMEN: Soft, non-tender, non-distended. Bowel sounds present. No organomegaly or mass.  EXTREMITIES: No pedal edema, cyanosis, or clubbing.  NEUROLOGIC: Cranial nerves II through XII are  intact. Muscle strength 5/5 in all extremities. Sensation intact. Gait not checked.  PSYCHIATRIC: The patient is alert and oriented x 3.  SKIN: No obvious rash, lesion, or ulcer.  DATA REVIEW:   CBC Recent Labs  Lab 09/03/18 0510  WBC 11.3*  HGB 13.3  HCT 39.8  PLT 249    Chemistries  Recent Labs  Lab 09/03/18 0510  NA 136  K 3.8  CL 100  CO2 29  GLUCOSE 285*  BUN 14  CREATININE 0.71  CALCIUM 8.7*  MG 2.0     Microbiology Results  No results found for this or any previous visit.  RADIOLOGY:  No results found.   Management plans discussed with the patient, family and they are in agreement.  CODE STATUS: Full Code   TOTAL TIME TAKING CARE OF THIS PATIENT: 38 minutes.    Coleson Kant M.D on 09/03/2018 at 12:43 PM  Between 7am to 6pm - Pager - [phone]  After 6pm go to [email or web address] - Proofreader  [facility] [place]  Office  [phone]  CC: Primary care physician; [name], MD   Note: This dictation was prepared with Dragon dictation along with smaller phrase technology. Any transcriptional errors that result from this process are unintentional.

## 2018-09-04 ENCOUNTER — Other Ambulatory Visit: Payer: Self-pay | Admitting: Nurse Practitioner

## 2018-09-04 DIAGNOSIS — R42 Dizziness and giddiness: Secondary | ICD-10-CM

## 2018-09-06 ENCOUNTER — Telehealth: Payer: Self-pay

## 2018-09-06 NOTE — Telephone Encounter (Signed)
Attempted to reach patient for TCM call and to schedule hospital follow up appt.   Left message for pt to contact office.

## 2018-09-06 NOTE — Telephone Encounter (Signed)
Patient needs a hospital follow-up appointment please

## 2018-09-07 NOTE — Telephone Encounter (Signed)
Second attempt to reach pt for TCM call and needs to schedule hospital follow up. Pt to call me directly at [phone].

## 2018-09-07 NOTE — Telephone Encounter (Signed)
Transition Care Management Follow-up Telephone Call  Date of discharge and from where: 09/03/18 [facility]  How have you been since you were released from the hospital? Pt states he is doing okay, still coughing some  Any questions or concerns? No   Items Reviewed:  Did the pt receive and understand the discharge instructions provided? Yes   Medications obtained and verified? Yes   Started zpak and robitussin DM  Any new allergies since your discharge? No   Dietary orders reviewed? Yes  Do you have support at home? Yes   Functional Questionnaire: (I = Independent and D = Dependent) ADLs: I  Bathing/Dressing- I  Meal Prep- I  Eating- I  Maintaining continence- I  Transferring/Ambulation- I  Managing Meds- I  Follow up appointments reviewed:   PCP Hospital f/u appt confirmed? Yes  Scheduled to see Dr. Sanda Klein on 09/15/18 @ 11:00.  Are transportation arrangements needed? No   If their condition worsens, is the pt aware to call PCP or go to the Emergency Dept.? Yes  Was the patient provided with contact information for the PCP's office or ED? Yes  Was to pt encouraged to call back with questions or concerns? Yes

## 2018-09-15 ENCOUNTER — Inpatient Hospital Stay: Payer: Medicare HMO | Admitting: Family Medicine

## 2018-10-08 ENCOUNTER — Ambulatory Visit (INDEPENDENT_AMBULATORY_CARE_PROVIDER_SITE_OTHER): Payer: Medicare HMO | Admitting: Family Medicine

## 2018-10-08 ENCOUNTER — Encounter: Payer: Self-pay | Admitting: Family Medicine

## 2018-10-08 ENCOUNTER — Ambulatory Visit
Admission: RE | Admit: 2018-10-08 | Discharge: 2018-10-08 | Disposition: A | Discharge status: Home / Self Care | Payer: Medicare HMO | Source: Ambulatory Visit | Attending: Family Medicine | Admitting: Family Medicine

## 2018-10-08 VITALS — BP 138/64 | HR 85 | Temp 97.4°F | Resp 14 | Ht 72.0 in | Wt 216.0 lb

## 2018-10-08 DIAGNOSIS — E782 Mixed hyperlipidemia: Secondary | ICD-10-CM | POA: Diagnosis not present

## 2018-10-08 DIAGNOSIS — J189 Pneumonia, unspecified organism: Secondary | ICD-10-CM

## 2018-10-08 DIAGNOSIS — E785 Hyperlipidemia, unspecified: Secondary | ICD-10-CM

## 2018-10-08 DIAGNOSIS — E1165 Type 2 diabetes mellitus with hyperglycemia: Secondary | ICD-10-CM | POA: Diagnosis not present

## 2018-10-08 DIAGNOSIS — R2689 Other abnormalities of gait and mobility: Secondary | ICD-10-CM

## 2018-10-08 DIAGNOSIS — Z5181 Encounter for therapeutic drug level monitoring: Secondary | ICD-10-CM

## 2018-10-08 DIAGNOSIS — I2510 Atherosclerotic heart disease of native coronary artery without angina pectoris: Secondary | ICD-10-CM

## 2018-10-08 DIAGNOSIS — Z794 Long term (current) use of insulin: Secondary | ICD-10-CM

## 2018-10-08 DIAGNOSIS — I739 Peripheral vascular disease, unspecified: Secondary | ICD-10-CM

## 2018-10-08 DIAGNOSIS — I779 Disorder of arteries and arterioles, unspecified: Secondary | ICD-10-CM

## 2018-10-08 DIAGNOSIS — R918 Other nonspecific abnormal finding of lung field: Secondary | ICD-10-CM | POA: Insufficient documentation

## 2018-10-08 DIAGNOSIS — L602 Onychogryphosis: Secondary | ICD-10-CM

## 2018-10-08 DIAGNOSIS — IMO0001 Reserved for inherently not codable concepts without codable children: Secondary | ICD-10-CM

## 2018-10-08 DIAGNOSIS — E669 Obesity, unspecified: Secondary | ICD-10-CM

## 2018-10-08 DIAGNOSIS — I10 Essential (primary) hypertension: Secondary | ICD-10-CM

## 2018-10-08 IMAGING — CR DG CHEST 2V
1 series · 2 of 2 positions shown · non-contrast
Comparison: CT [DATE].  Chest x-ray [DATE].

CLINICAL DATA: Community acquired pneumonia.

EXAM:
CHEST - 2 VIEW

[Series 1: dg chest 2 view · 0.14mm/px · 2 of 2 slices shown]
[im 1/2]
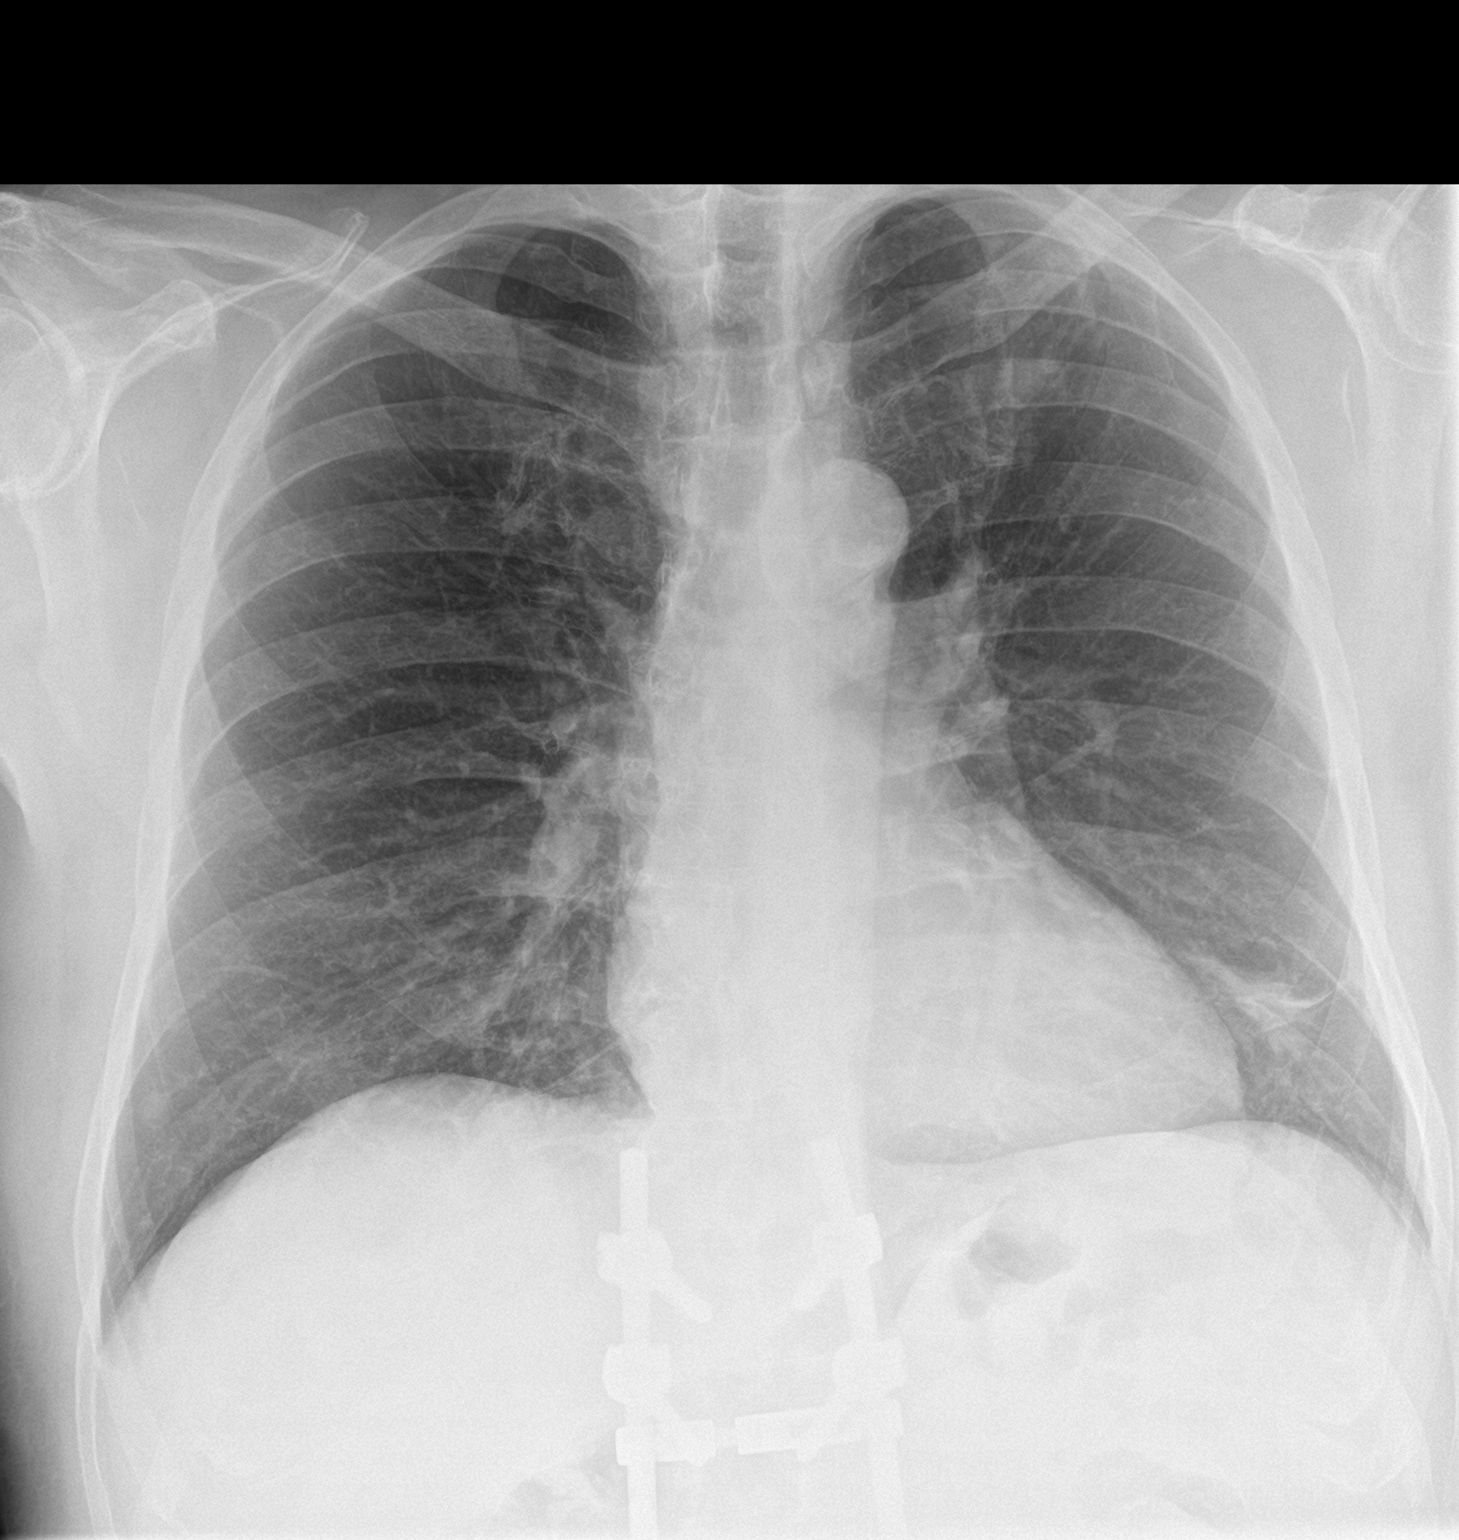
[im 2/2]
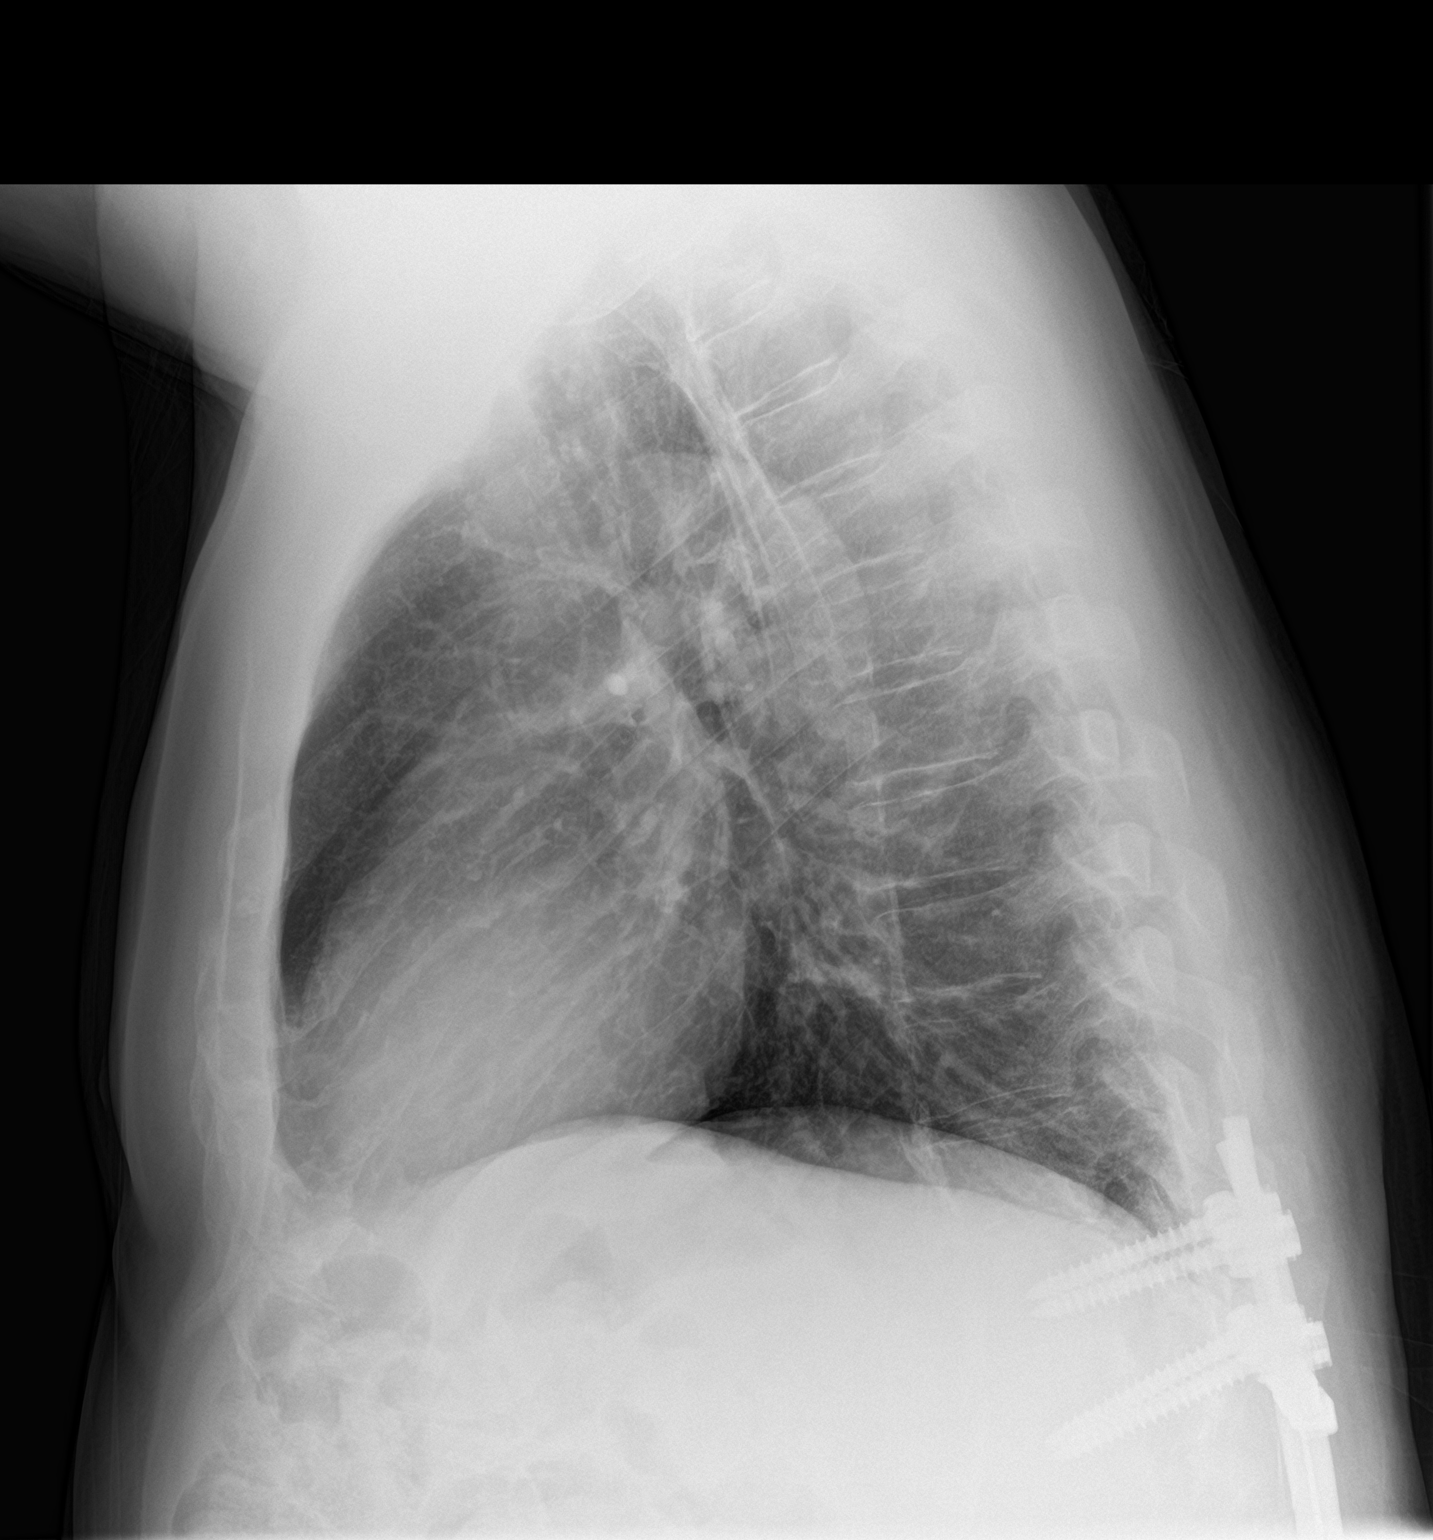

[2 of 2 positions shown; findings below may reference images not displayed]

FINDINGS: Mediastinum and hilar structures are unremarkable. Mild left base
atelectasis/infiltrate. Interim partial clearing from prior exam. No
pleural effusion or pneumothorax. Cardiomegaly with normal pulmonary
vascularity. Degenerative change thoracic spine. Prior lumbar spine
fusion.
IMPRESSION: 1. Persistent density left upper lung. This could represent
persistent pneumonia or tumor. No interim improvement.

2. Mild left base subsegmental atelectasis/infiltrate. Interim
slight clearing from prior exam. Underlying tumor can not be
excluded.

3. Nodular densities noted on prior CT of [DATE] best identified
by prior CT. Persistent nodular density noted over the right lung
base.

## 2018-10-08 MED ORDER — INSULIN DEGLUDEC-LIRAGLUTIDE 100-3.6 UNIT-MG/ML ~~LOC~~ SOPN: 20.0000 [IU] | PEN_INJECTOR | Freq: Every day | SUBCUTANEOUS | Status: DC

## 2018-10-08 NOTE — Patient Instructions (Signed)
We'll get the scan of your neck Go across the street for your chest xray today We'll get labs today

## 2018-10-08 NOTE — Assessment & Plan Note (Signed)
Due for chest CT December 01, 2018

## 2018-10-08 NOTE — Assessment & Plan Note (Signed)
Resolved; hospitlaized more than a month ago

## 2018-10-08 NOTE — Progress Notes (Signed)
Victor Fox, please let the patient know that his CBC is normal; other labs pending at time of this note

## 2018-10-08 NOTE — Assessment & Plan Note (Signed)
No chest pain, will notify cardiologist about imaging findings

## 2018-10-08 NOTE — Progress Notes (Signed)
BP 138/64   Pulse 85   Temp (!) 97.4 F (36.3 C) (Oral)   Resp 14   Ht 6' (1.829 m)   Wt 216 lb (98 kg)   SpO2 96%   BMI 29.29 kg/m    Subjective:    Patient ID: Victor Fox, male    DOB: 08-Nov-1945, 73 y.o.   MRN: [id number]  HPI: Victor Fox is a 73 y.o. male  Chief Complaint  Patient presents with  . Hospitalization Follow-up    pneumonia    HPI Patient states he is here for hospital f/u; however, he was admitted on January 1st and discharged on January 3rd (more than a month ago); he had community acquired pneumonia, multilobar lung infiltrate on admission; he was found to have multiple small pulmonary nodules up to 8 mm in the right lower lobe; he will be due for chest CT in 3-6 months; he also underwent an echocardiogram which showed grade 1 diastolic dysfunction, moderately calcified aortic valve; influenza test was negative  He is also here for f/u Type 2 diabetes; on insulin; "doing okay"; checking FSBS once a day; range... didn't check it this morning; does not remember yesterday; in the 200 range when I pressed him; trying to watch a good diet; no sugary drinks, just diet Coke and water; no new problems with feet  Lab Results  Component Value Date   HGBA1C 10.0 (H) 12/17/2017   Lab Results  Component Value Date   MICROALBUR 33.9 (H) 11/07/2015   Carotid atherosclerosis noted on previous imaging; on statin and aspirin  He has trouble getting up and down to get his socks on; hard to get up for some reason; since the stroke; not working with PT; neither arm or leg is weak; balance is not 100%  Depression screen [facility] 2/9 10/08/2018 03/08/2018 01/05/2018 03/30/2017 12/11/2016  Decreased Interest 0 0 0 0 0  Down, Depressed, Hopeless 0 0 0 0 0  PHQ - 2 Score 0 0 0 0 0  Altered sleeping 0 - - - -  Tired, decreased energy 0 - - - -  Change in appetite 0 - - - -  Feeling bad or failure about yourself  0 - - - -  Trouble concentrating 0 - - - -  Moving slowly or  fidgety/restless 0 - - - -  Suicidal thoughts 0 - - - -  PHQ-9 Score 0 - - - -  Difficult doing work/chores Not difficult at all - - - -   Fall Risk  10/08/2018 03/08/2018 01/05/2018 03/30/2017 12/11/2016  Falls in the past year? 0 No No No No  Comment - - - - -  Number falls in past yr: 0 - - - -  Injury with Fall? 0 - - - -    Relevant past medical, surgical, family and social history reviewed Past Medical History:  Diagnosis Date  . Arthritis   . Benign neoplasm of unspecified site   . Carotid artery disease ([place])    a. carotid duplex 05/2015: less tahn 39% stenosis bilaterally, stable over the past year, recommend repeat imaging in 2 years (05/2017)  . Compression fracture    a. L2  . CVA (cerebral vascular accident) ([place]) 06/2012  . Diabetes mellitus without complication ([place])   . Heart murmur    a. echo 06/2012: EF >55%, LVH, aortic valve sclerosis  . Hyperlipidemia   . Hypertension   . Neuropathy   . Pneumonia   . Tobacco  abuse    a. smoked x 25 years  . Vitamin D deficiency    Past Surgical History:  Procedure Laterality Date  . ARTHRODESIS    . BACK SURGERY     x 2  . discectomy    . trigger release     Family History  Problem Relation Age of Onset  . Heart disease Brother   . Hypertension Mother    Social History   Tobacco Use  . Smoking status: Former Smoker    Packs/day: 1.00    Years: 10.00    Pack years: 10.00    Types: Cigarettes  . Smokeless tobacco: Never Used  Substance Use Topics  . Alcohol use: No  . Drug use: No     [facility] Visit from 10/08/2018 in [facility]  AUDIT-C Score  0      Interim medical history since last visit reviewed. Allergies and medications reviewed  Review of Systems  Constitutional: Negative for fever and unexpected weight change.  HENT: Negative for nosebleeds.   Respiratory: Positive for cough (improving, just residual).   Cardiovascular: Positive for palpitations (since 1991, "skips a beat",  nothing to do for it). Negative for chest pain.  Endocrine: Positive for polydipsia.  Hematological: Does not bruise/bleed easily.   Per HPI unless specifically indicated above     Objective:    BP 138/64   Pulse 85   Temp (!) 97.4 F (36.3 C) (Oral)   Resp 14   Ht 6' (1.829 m)   Wt 216 lb (98 kg)   SpO2 96%   BMI 29.29 kg/m   Wt Readings from Last 3 Encounters:  10/08/18 216 lb (98 kg)  09/01/18 225 lb (102.1 kg)  03/08/18 216 lb 1.6 oz (98 kg)  MD note: I do not believe the 225 pounds was actually measured  Physical Exam Constitutional:      General: He is not in acute distress.    Appearance: He is well-developed.  HENT:     Head: Normocephalic and atraumatic.  Eyes:     General: No scleral icterus. Neck:     Thyroid: No thyromegaly.  Cardiovascular:     Rate and Rhythm: Normal rate and regular rhythm.  Pulmonary:     Effort: Pulmonary effort is normal.     Breath sounds: Normal breath sounds.  Abdominal:     General: Bowel sounds are normal. There is no distension.     Palpations: Abdomen is soft.  Skin:    General: Skin is warm and dry.     Coloration: Skin is not pale.  Neurological:     Coordination: Coordination normal.  Psychiatric:        Behavior: Behavior normal.        Thought Content: Thought content normal.        Judgment: Judgment normal.    Diabetic Foot Form - Detailed   Diabetic Foot Exam - detailed Diabetic Foot exam was performed with the following findings:  Yes 10/08/2018  8:19 AM  Visual Foot Exam completed.:  Yes  Pulse Foot Exam completed.:  Yes  Right Dorsalis Pedis:  Present Left Dorsalis Pedis:  Present  Sensory Foot Exam Completed.:  Yes Semmes-Weinstein Monofilament Test R Site 1-Great Toe:  Pos L Site 1-Great Toe:  Pos         Results for orders placed or performed during the hospital encounter of 09/01/18  CBC with Differential  Result Value Ref Range   WBC 11.7 (H) 4.0 -  10.5 K/uL   RBC 4.72 4.22 - 5.81 MIL/uL    Hemoglobin 13.3 13.0 - 17.0 g/dL   HCT 39.3 39.0 - 52.0 %   MCV 83.3 80.0 - 100.0 fL   MCH 28.2 26.0 - 34.0 pg   MCHC 33.8 30.0 - 36.0 g/dL   RDW 12.4 11.5 - 15.5 %   Platelets 232 150 - 400 K/uL   nRBC 0.0 0.0 - 0.2 %   Neutrophils Relative % 72 %   Neutro Abs 8.4 (H) 1.7 - 7.7 K/uL   Lymphocytes Relative 20 %   Lymphs Abs 2.3 0.7 - 4.0 K/uL   Monocytes Relative 7 %   Monocytes Absolute 0.8 0.1 - 1.0 K/uL   Eosinophils Relative 1 %   Eosinophils Absolute 0.2 0.0 - 0.5 K/uL   Basophils Relative 0 %   Basophils Absolute 0.0 0.0 - 0.1 K/uL   Immature Granulocytes 0 %   Abs Immature Granulocytes 0.05 0.00 - 0.07 K/uL  Basic metabolic panel  Result Value Ref Range   Sodium 134 (L) 135 - 145 mmol/L   Potassium 3.8 3.5 - 5.1 mmol/L   Chloride 100 98 - 111 mmol/L   CO2 28 22 - 32 mmol/L   Glucose, Bld 368 (H) 70 - 99 mg/dL   BUN 12 8 - 23 mg/dL   Creatinine, Ser 0.80 0.61 - 1.24 mg/dL   Calcium 8.8 (L) 8.9 - 10.3 mg/dL   GFR calc non Af Amer >60 >60 mL/min   GFR calc Af Amer >60 >60 mL/min   Anion gap 6 5 - 15  Influenza panel by PCR (type A & B)  Result Value Ref Range   Influenza A By PCR NEGATIVE NEGATIVE   Influenza B By PCR NEGATIVE NEGATIVE  Basic metabolic panel  Result Value Ref Range   Sodium 137 135 - 145 mmol/L   Potassium 3.6 3.5 - 5.1 mmol/L   Chloride 102 98 - 111 mmol/L   CO2 27 22 - 32 mmol/L   Glucose, Bld 239 (H) 70 - 99 mg/dL   BUN 9 8 - 23 mg/dL   Creatinine, Ser 0.76 0.61 - 1.24 mg/dL   Calcium 8.6 (L) 8.9 - 10.3 mg/dL   GFR calc non Af Amer >60 >60 mL/min   GFR calc Af Amer >60 >60 mL/min   Anion gap 8 5 - 15  CBC  Result Value Ref Range   WBC 11.8 (H) 4.0 - 10.5 K/uL   RBC 4.83 4.22 - 5.81 MIL/uL   Hemoglobin 13.6 13.0 - 17.0 g/dL   HCT 40.9 39.0 - 52.0 %   MCV 84.7 80.0 - 100.0 fL   MCH 28.2 26.0 - 34.0 pg   MCHC 33.3 30.0 - 36.0 g/dL   RDW 12.3 11.5 - 15.5 %   Platelets 245 150 - 400 K/uL   nRBC 0.0 0.0 - 0.2 %  Glucose, capillary    Result Value Ref Range   Glucose-Capillary 417 (H) 70 - 99 mg/dL   Comment 1 Notify RN   Glucose, capillary  Result Value Ref Range   Glucose-Capillary 236 (H) 70 - 99 mg/dL   Comment 1 Notify RN   Glucose, capillary  Result Value Ref Range   Glucose-Capillary 239 (H) 70 - 99 mg/dL  Glucose, capillary  Result Value Ref Range   Glucose-Capillary 278 (H) 70 - 99 mg/dL   Comment 1 Notify RN   Basic metabolic panel  Result Value Ref Range   Sodium 136 135 -  145 mmol/L   Potassium 3.8 3.5 - 5.1 mmol/L   Chloride 100 98 - 111 mmol/L   CO2 29 22 - 32 mmol/L   Glucose, Bld 285 (H) 70 - 99 mg/dL   BUN 14 8 - 23 mg/dL   Creatinine, Ser 0.71 0.61 - 1.24 mg/dL   Calcium 8.7 (L) 8.9 - 10.3 mg/dL   GFR calc non Af Amer >60 >60 mL/min   GFR calc Af Amer >60 >60 mL/min   Anion gap 7 5 - 15  CBC  Result Value Ref Range   WBC 11.3 (H) 4.0 - 10.5 K/uL   RBC 4.79 4.22 - 5.81 MIL/uL   Hemoglobin 13.3 13.0 - 17.0 g/dL   HCT 39.8 39.0 - 52.0 %   MCV 83.1 80.0 - 100.0 fL   MCH 27.8 26.0 - 34.0 pg   MCHC 33.4 30.0 - 36.0 g/dL   RDW 12.3 11.5 - 15.5 %   Platelets 249 150 - 400 K/uL   nRBC 0.0 0.0 - 0.2 %  Magnesium  Result Value Ref Range   Magnesium 2.0 1.7 - 2.4 mg/dL  Phosphorus  Result Value Ref Range   Phosphorus 3.8 2.5 - 4.6 mg/dL  Glucose, capillary  Result Value Ref Range   Glucose-Capillary 229 (H) 70 - 99 mg/dL   Comment 1 Notify RN   Glucose, capillary  Result Value Ref Range   Glucose-Capillary 302 (H) 70 - 99 mg/dL  Glucose, capillary  Result Value Ref Range   Glucose-Capillary 257 (H) 70 - 99 mg/dL  Glucose, capillary  Result Value Ref Range   Glucose-Capillary 274 (H) 70 - 99 mg/dL  ECHOCARDIOGRAM COMPLETE  Result Value Ref Range   Weight 3,600 oz   Height 72 in   BP 174/75 mmHg      Assessment & Plan:   Problem List Items Addressed This Visit      Cardiovascular and Mediastinum   HTN (hypertension) (Chronic)   Carotid artery disease (HCC) (Chronic)    Relevant Orders   US Carotid Bilateral   Coronary artery disease    No chest pain, will notify cardiologist about imaging findings        Respiratory   CAP (community acquired pneumonia)    Resolved; hospitlaized more than a month ago      Relevant Orders   DG Chest 2 View   CBC with Differential/Platelet     Endocrine   Uncontrolled type 2 diabetes mellitus without complication, with long-term current use of insulin ([place]) - Primary   Relevant Medications   Insulin Degludec-Liraglutide (XULTOPHY) 100-3.6 UNIT-MG/ML SOPN   Other Relevant Orders   Hemoglobin A1C   Urine Microalbumin w/creat. ratio   Ambulatory referral to Podiatry     Other   Hyperlipidemia (Chronic)   Pulmonary nodules    Due for chest CT December 01, 2018      Relevant Orders   CT Chest Wo Contrast    Other Visit Diagnoses    Abnormal findings on diagnostic imaging of lung       Relevant Orders   CT Chest Wo Contrast   Dyslipidemia       Relevant Orders   Lipid panel   Obesity (BMI 30.0-34.9)       Medication monitoring encounter       Relevant Orders   COMPLETE METABOLIC PANEL WITH GFR   Onychogryphosis       Relevant Orders   Ambulatory referral to Podiatry   Balance problem  Relevant Orders   Ambulatory referral to Physical Therapy       Follow up plan: Return in about 2 weeks (around 10/22/2018) for follow-up visit with Dr. Sanda Klein (or just after); bring glucose readings.  An after-visit summary was printed and given to the patient at [place].  Please see the patient instructions which may contain other information and recommendations beyond what is mentioned above in the assessment and plan.  Meds ordered this encounter  Medications  . Insulin Degludec-Liraglutide (XULTOPHY) 100-3.6 UNIT-MG/ML SOPN    Sig: Inject 20 Units into the skin daily.    Lot # [id number], exp Oct 2019    Orders Placed This Encounter  Procedures  . DG Chest 2 View  . CT Chest Wo Contrast  . US Carotid  Bilateral  . Hemoglobin A1C  . Urine Microalbumin w/creat. ratio  . Lipid panel  . COMPLETE METABOLIC PANEL WITH GFR  . CBC with Differential/Platelet  . Ambulatory referral to Podiatry  . Ambulatory referral to Physical Therapy

## 2018-10-09 LAB — CBC WITH DIFFERENTIAL/PLATELET
Absolute Monocytes: 677 cells/uL (ref 200–950)
BASOS PCT: 0.4 %
Basophils Absolute: 38 cells/uL (ref 0–200)
Eosinophils Absolute: 169 cells/uL (ref 15–500)
Eosinophils Relative: 1.8 %
HCT: 47.8 % (ref 38.5–50.0)
Hemoglobin: 16 g/dL (ref 13.2–17.1)
Lymphs Abs: 3281 cells/uL (ref 850–3900)
MCH: 28.3 pg (ref 27.0–33.0)
MCHC: 33.5 g/dL (ref 32.0–36.0)
MCV: 84.5 fL (ref 80.0–100.0)
MPV: 9.3 fL (ref 7.5–12.5)
Monocytes Relative: 7.2 %
Neutro Abs: 5236 cells/uL (ref 1500–7800)
Neutrophils Relative %: 55.7 %
Platelets: 201 10*3/uL (ref 140–400)
RBC: 5.66 10*6/uL (ref 4.20–5.80)
RDW: 13.7 % (ref 11.0–15.0)
Total Lymphocyte: 34.9 %
WBC: 9.4 10*3/uL (ref 3.8–10.8)

## 2018-10-09 LAB — LIPID PANEL
Cholesterol: 162 mg/dL (ref <200)
HDL: 39 mg/dL — ABNORMAL LOW (ref >=40)
LDL Cholesterol (Calc): 102 mg/dL (calc) — ABNORMAL HIGH
Non-HDL Cholesterol (Calc): 123 mg/dL (calc) (ref <130)
Total CHOL/HDL Ratio: 4.2 (calc) (ref <5.0)
Triglycerides: 117 mg/dL (ref <150)

## 2018-10-09 LAB — COMPLETE METABOLIC PANEL WITH GFR
AG Ratio: 1.5 (calc) (ref 1.0–2.5)
ALT: 15 U/L (ref 9–46)
AST: 15 U/L (ref 10–35)
Albumin: 3.8 g/dL (ref 3.6–5.1)
Alkaline phosphatase (APISO): 84 U/L (ref 35–144)
BUN: 15 mg/dL (ref 7–25)
CO2: 29 mmol/L (ref 20–32)
Calcium: 9.4 mg/dL (ref 8.6–10.3)
Chloride: 101 mmol/L (ref 98–110)
Creat: 1 mg/dL (ref 0.70–1.18)
GFR, Est African American: 87 mL/min/{1.73_m2} (ref >=60)
GFR, Est Non African American: 75 mL/min/{1.73_m2} (ref >=60)
Globulin: 2.5 g/dL (calc) (ref 1.9–3.7)
Glucose, Bld: 320 mg/dL — ABNORMAL HIGH (ref 65–99)
Potassium: 4 mmol/L (ref 3.5–5.3)
Sodium: 138 mmol/L (ref 135–146)
Total Bilirubin: 0.6 mg/dL (ref 0.2–1.2)
Total Protein: 6.3 g/dL (ref 6.1–8.1)

## 2018-10-09 LAB — HEMOGLOBIN A1C
HEMOGLOBIN A1C: 11.7 %{Hb} — AB (ref <5.7)
Mean Plasma Glucose: 289 (calc)
eAG (mmol/L): 16 (calc)

## 2018-10-09 LAB — MICROALBUMIN / CREATININE URINE RATIO
Creatinine, Urine: 64 mg/dL (ref 20–320)
MICROALB UR: 3.7 mg/dL
Microalb Creat Ratio: 58 mcg/mg creat — ABNORMAL HIGH (ref <30)

## 2018-10-10 ENCOUNTER — Other Ambulatory Visit: Payer: Self-pay | Admitting: Family Medicine

## 2018-10-10 DIAGNOSIS — E1165 Type 2 diabetes mellitus with hyperglycemia: Principal | ICD-10-CM

## 2018-10-10 DIAGNOSIS — IMO0001 Reserved for inherently not codable concepts without codable children: Secondary | ICD-10-CM

## 2018-10-10 DIAGNOSIS — Z794 Long term (current) use of insulin: Principal | ICD-10-CM

## 2018-10-10 MED ORDER — ATORVASTATIN CALCIUM 40 MG PO TABS: 40.0000 mg | ORAL_TABLET | Freq: Every day | ORAL | 1 refills | Status: DC

## 2018-10-10 NOTE — Progress Notes (Signed)
Victor Fox, please let the patient know that his blood sugar is out of control; please REFER to endocrinologist for uncontrolled type 2 diabetes In the meantime, increase dose of Xultophy by TWO units every FOUR days; go from 20 to 22 units for four days, then go up to 24 units for four days, and so on until he reaches 30 units then stay there He is spilling extra protein through his kidneys because his diabetes is out of control; this may improve as he gets his sugars under control Start back on statin; I'm going to bet that he hasn't been taking it because we only prescribed a two month supply last May, so I've sent a new Rx and we want him to start back on that

## 2018-10-10 NOTE — Progress Notes (Signed)
Increase Xultophy REFER to endo (CMA to enter) Start back on statin (doubt compliance, as last Rx was for two months in May 2019)

## 2018-10-10 NOTE — Telephone Encounter (Signed)
Lab Results  Component Value Date   CREATININE 1.00 10/08/2018

## 2018-10-11 ENCOUNTER — Telehealth: Payer: Self-pay

## 2018-10-11 DIAGNOSIS — E1165 Type 2 diabetes mellitus with hyperglycemia: Principal | ICD-10-CM

## 2018-10-11 DIAGNOSIS — Z794 Long term (current) use of insulin: Principal | ICD-10-CM

## 2018-10-11 DIAGNOSIS — IMO0001 Reserved for inherently not codable concepts without codable children: Secondary | ICD-10-CM

## 2018-10-13 ENCOUNTER — Other Ambulatory Visit: Payer: Self-pay | Admitting: Family Medicine

## 2018-10-13 DIAGNOSIS — R918 Other nonspecific abnormal finding of lung field: Secondary | ICD-10-CM

## 2018-10-13 NOTE — Progress Notes (Signed)
Referrals entered to Dr. Genevive Bi, [place] surgeon, as well as oncology Detailed message left for patient

## 2018-10-18 NOTE — Telephone Encounter (Signed)
Erroneous entry

## 2018-10-19 ENCOUNTER — Encounter: Payer: Self-pay | Admitting: *Deleted

## 2018-10-20 ENCOUNTER — Telehealth: Payer: Self-pay | Admitting: *Deleted

## 2018-10-20 NOTE — Telephone Encounter (Signed)
Please call patient to schedule him to come in to discuss CT findings at his earliest convenience. Thanks!

## 2018-10-20 NOTE — Telephone Encounter (Signed)
No ans no vm   °

## 2018-10-20 NOTE — Telephone Encounter (Signed)
-----   Message from Minna Merritts, MD sent at 10/20/2018  8:26 AM EST ----- He has not been seen in clinic since 2017 Triage can we call him and see if he would like to be seen to go over his CT scan showing coronary disease Thx TG ----- Message ----- From: [name], MD Sent: 10/08/2018   8:25 AM EST To: Minna Merritts, MD  Please see findings from chest CT and echo -- thank you

## 2018-10-21 ENCOUNTER — Inpatient Hospital Stay: Payer: Medicare HMO | Attending: Oncology

## 2018-10-21 NOTE — Progress Notes (Signed)
  Oncology Nurse Navigator Documentation  Navigator Location: [facility] Onc (10/21/18 0900) Referral date to RadOnc/MedOnc: 10/19/18 (10/21/18 0900) )Navigator Encounter Type: Introductory phone call (10/21/18 0900)   Abnormal Finding Date: 10/08/18 (10/21/18 0900)                   Treatment Phase: Abnormal Scans (10/21/18 0900) Barriers/Navigation Needs: Coordination of Care (10/21/18 0900)   Interventions: Coordination of Care (10/21/18 0900)   Coordination of Care: Appts (10/21/18 0900)        Acuity: Level 2 (10/21/18 0900)   Acuity Level 2: Initial guidance, education and coordination as needed;Educational needs;Assistance expediting appointments (10/21/18 0900)    referral received from Dr. Delight Ovens office for patient to be seen for persistent LUL density despite antibiotic treatment. Pt originally scheduled to be seen in the lung multidisciplinary clinic on 10/22/18 with Dr. Tasia Catchings and Dr. [name]. Due to inclement weather, pt has been rescheduled to next week on Tuesday 2/25 at 3:30pm with Dr. Tasia Catchings and Friday 2/28 with Dr. Patsey Berthold. Multiple attempts made to contact patient about appts being rescheduled without success. Messages left for patient with new appt information as well as contact number to call back to confirm appts or change appts if needed. Appt reminder has been mailed to patient as well. Nothing further needed Time Spent with Patient: 30 (10/21/18 0900)

## 2018-10-21 NOTE — Progress Notes (Signed)
Tumor Board Documentation  Victor Fox was presented by Victor Chaco, RN at our Tumor Board on 10/21/2018, which included representatives from medical oncology, radiation oncology, surgical, radiology, pathology, navigation, internal medicine, research, palliative care, pulmonology.  Victor Fox currently presents as a new patient, for discussion, for [place] with history of the following treatments: active survellience.  Additionally, we reviewed previous medical and familial history, history of present illness, and recent lab results along with all available histopathologic and imaging studies. The tumor board considered available treatment options and made the following recommendations: Active surveillance FU CT Scan in 3 - 6 months  The following procedures/referrals were also placed: No orders of the defined types were placed in this encounter.   Clinical Trial Status: not discussed   Staging used:    National site-specific guidelines   were discussed with respect to the case.  Tumor board is a meeting of clinicians from various specialty areas who evaluate and discuss patients for whom a multidisciplinary approach is being considered. Final determinations in the plan of care are those of the provider(s). The responsibility for follow up of recommendations given during tumor board is that of the provider.   Today's extended care, comprehensive team conference, Victor Fox was not present for the discussion and was not examined.   Multidisciplinary Tumor Board is a multidisciplinary case peer review process.  Decisions discussed in the Multidisciplinary Tumor Board reflect the opinions of the specialists present at the conference without having examined the patient.  Ultimately, treatment and diagnostic decisions rest with the primary provider(s) and the patient.

## 2018-10-22 ENCOUNTER — Ambulatory Visit: Payer: Medicare HMO | Admitting: Family Medicine

## 2018-10-22 ENCOUNTER — Institutional Professional Consult (permissible substitution): Payer: Medicare HMO | Admitting: Internal Medicine

## 2018-10-22 ENCOUNTER — Inpatient Hospital Stay: Payer: Medicare HMO | Admitting: Oncology

## 2018-10-26 ENCOUNTER — Inpatient Hospital Stay: Payer: Medicare HMO | Admitting: Oncology

## 2018-10-27 ENCOUNTER — Encounter: Payer: Self-pay | Admitting: Family Medicine

## 2018-10-27 ENCOUNTER — Telehealth: Payer: Self-pay | Admitting: Family Medicine

## 2018-10-27 NOTE — Telephone Encounter (Signed)
Thank you Staff, please send patient a certified letter that he needs to be sure to get a repeat chest CT on or just after December 01, 2018, and schedule an appointment to see me for a few days after that scan

## 2018-10-27 NOTE — Telephone Encounter (Signed)
I called the patient to schedule Medicare AWV-I with Nurse Health Advisor, [place], but there was no answer and no option to leave a message because the voicemail was full.  If the patient calls back, please schedule Medicare Wellness Visit with Nurse Health Advisor. VDM (DD)

## 2018-10-27 NOTE — Telephone Encounter (Signed)
-----   Message from [place], [place] sent at 10/27/2018  8:15 AM EST ----- Regarding: No show Hi Dr. Sanda Klein,   This patient was referred to [place] by you and he did not show for his appointment. I have tried multiple times to get in touch with him and left multiple messages for him to call me back. He has not returned any of my calls. At this time, the patient's consult will not be rescheduled and he will need to be referred back to [place] in the future if you still want him seen by oncology.   We did discuss his case at our tumor conference last Thursday (2/20). Recommendations were for patient to have a repeat CT scan in 3-6 months but does not require any biopsy at this time. Just wanted you to be aware of these recommendations.   Let me know if you have any questions or need anything.   Thanks, [employer]

## 2018-10-28 NOTE — Telephone Encounter (Signed)
Letter sent via certified mail on today, 10/28/2018.

## 2018-10-29 ENCOUNTER — Institutional Professional Consult (permissible substitution): Payer: Medicare HMO | Admitting: Pulmonary Disease

## 2018-11-05 ENCOUNTER — Ambulatory Visit (INDEPENDENT_AMBULATORY_CARE_PROVIDER_SITE_OTHER): Payer: Medicare HMO | Admitting: Family Medicine

## 2018-11-05 ENCOUNTER — Other Ambulatory Visit: Payer: Self-pay

## 2018-11-05 ENCOUNTER — Telehealth: Payer: Self-pay

## 2018-11-05 ENCOUNTER — Encounter: Payer: Self-pay | Admitting: Family Medicine

## 2018-11-05 ENCOUNTER — Telehealth: Payer: Self-pay | Admitting: Cardiovascular Disease

## 2018-11-05 ENCOUNTER — Ambulatory Visit (INDEPENDENT_AMBULATORY_CARE_PROVIDER_SITE_OTHER): Payer: Medicare HMO

## 2018-11-05 VITALS — BP 148/72 | HR 63 | Temp 97.7°F | Resp 16 | Ht 72.0 in | Wt 214.5 lb

## 2018-11-05 DIAGNOSIS — Z23 Encounter for immunization: Secondary | ICD-10-CM | POA: Insufficient documentation

## 2018-11-05 DIAGNOSIS — I10 Essential (primary) hypertension: Secondary | ICD-10-CM | POA: Diagnosis not present

## 2018-11-05 DIAGNOSIS — Z86010 Personal history of colonic polyps: Secondary | ICD-10-CM

## 2018-11-05 DIAGNOSIS — E782 Mixed hyperlipidemia: Secondary | ICD-10-CM | POA: Diagnosis not present

## 2018-11-05 DIAGNOSIS — Z794 Long term (current) use of insulin: Secondary | ICD-10-CM

## 2018-11-05 DIAGNOSIS — G629 Polyneuropathy, unspecified: Secondary | ICD-10-CM | POA: Insufficient documentation

## 2018-11-05 DIAGNOSIS — Z1211 Encounter for screening for malignant neoplasm of colon: Secondary | ICD-10-CM

## 2018-11-05 DIAGNOSIS — IMO0001 Reserved for inherently not codable concepts without codable children: Secondary | ICD-10-CM | POA: Insufficient documentation

## 2018-11-05 DIAGNOSIS — D369 Benign neoplasm, unspecified site: Secondary | ICD-10-CM | POA: Insufficient documentation

## 2018-11-05 DIAGNOSIS — Z0000 Encounter for general adult medical examination without abnormal findings: Secondary | ICD-10-CM | POA: Diagnosis not present

## 2018-11-05 DIAGNOSIS — IMO0002 Reserved for concepts with insufficient information to code with codable children: Secondary | ICD-10-CM

## 2018-11-05 DIAGNOSIS — E118 Type 2 diabetes mellitus with unspecified complications: Secondary | ICD-10-CM | POA: Diagnosis not present

## 2018-11-05 DIAGNOSIS — R918 Other nonspecific abnormal finding of lung field: Secondary | ICD-10-CM | POA: Diagnosis not present

## 2018-11-05 DIAGNOSIS — E1165 Type 2 diabetes mellitus with hyperglycemia: Secondary | ICD-10-CM

## 2018-11-05 DIAGNOSIS — R030 Elevated blood-pressure reading, without diagnosis of hypertension: Secondary | ICD-10-CM

## 2018-11-05 MED ORDER — METFORMIN HCL ER 500 MG PO TB24: 1000.0000 mg | ORAL_TABLET | Freq: Two times a day (BID) | ORAL | 0 refills | Status: DC

## 2018-11-05 NOTE — Telephone Encounter (Signed)
Gastroenterology Pre-Procedure Review  Request Date: 11/19/18 Requesting Physician: Dr. Vicente Males  PATIENT REVIEW QUESTIONS: The patient responded to the following health history questions as indicated:    1. Are you having any GI issues? no 2. Do you have a personal history of Polyps? yes (2013 Polyps noted) 3. Do you have a family history of Colon Cancer or Polyps? no 4. Diabetes Mellitus? Yes Insulin  5. Joint replacements in the past 12 months?no 6. Major health problems in the past 3 months?yes (Pneumonia Jan 2020) 7. Any artificial heart valves, MVP, or defibrillator?no    MEDICATIONS & ALLERGIES:    Patient reports the following regarding taking any anticoagulation/antiplatelet therapy:   Plavix, Coumadin, Eliquis, Xarelto, Lovenox, Pradaxa, Brilinta, or Effient? no Aspirin? yes (325 mg sending blood thinner clearance to Dr. Gwenyth Ober)  Patient confirms/reports the following medications:  Current Outpatient Medications  Medication Sig Dispense Refill  . amLODipine (NORVASC) 10 MG tablet Take 1 tablet (10 mg total) by mouth daily. Appointment needed please for further refills (Patient not taking: Reported on 11/05/2018) 5 tablet 0  . aspirin 325 MG tablet Take 325 mg by mouth daily.    [name] atorvastatin (LIPITOR) 40 MG tablet Take 1 tablet (40 mg total) by mouth at bedtime. (Patient taking differently: Take 40 mg by mouth at bedtime. Pt still taking 20mg  dose) 90 tablet 1  . B-D UF III MINI PEN NEEDLES 31G X 5 MM MISC use once daily (Patient taking differently: Patient is using twice daily) 100 each PRN  . enalapril (VASOTEC) 20 MG tablet Take 1 tablet (20 mg total) by mouth daily. 90 tablet 1  . hydrochlorothiazide (HYDRODIURIL) 25 MG tablet Take 0.5 tablets (12.5 mg total) by mouth daily. (Patient not taking: Reported on 11/05/2018) 45 tablet 1  . Insulin Degludec-Liraglutide (XULTOPHY) 100-3.6 UNIT-MG/ML SOPN Inject 20 Units into the skin daily. Increase by 2 units every four days until you  reach 30 units daily then stay there    . Insulin Isophane & Regular Human (NOVOLIN 70/30 FLEXPEN) (70-30) 100 UNIT/ML PEN Inject 30 Units into the skin daily. Pt takes daily at 12 noon prior to going to work    . meclizine (ANTIVERT) 25 MG tablet TAKE 1 TABLET(25 MG) BY MOUTH THREE TIMES DAILY AS NEEDED FOR DIZZINESS (Patient not taking: Reported on 11/05/2018) 15 tablet 0  . metFORMIN (GLUCOPHAGE-XR) 500 MG 24 hr tablet TAKE 1 TABLET BY MOUTH TWICE DAILY 60 tablet 0   No current facility-administered medications for this visit.     Patient confirms/reports the following allergies:  No Known Allergies  No orders of the defined types were placed in this encounter.   AUTHORIZATION INFORMATION Primary Insurance: 1D#: Group #:  Secondary Insurance: 1D#: Group #:  SCHEDULE INFORMATION: Date: 11/19/18 Time: Location:[facility]

## 2018-11-05 NOTE — Patient Instructions (Addendum)
I want you to start taking 40 mg of atorvastatin daily for your cholesterol  I want you to start taking 10 mg of amlodipine for your blood pressure  Continue enalapril for blood pressure and kidney protection  For the next week, take one 500 mg metformin in the morning and TWO 500 mg metformins in the evening Then take TWO 500 mg metformin pills twice a day  You will be due for a chest CT the first week of April If you have not heard about this by March 24th, please call my office

## 2018-11-05 NOTE — Patient Instructions (Signed)
Victor Fox , Thank you for taking time to come for your Medicare Wellness Visit. I appreciate your ongoing commitment to your health goals. Please review the following plan we discussed and let me know if I can assist you in the future.   Screening recommendations/referrals: Colonoscopy: done 09/15/11. Referral placed to gastroenterology for repeat screening.  Recommended yearly ophthalmology/optometry visit for glaucoma screening and checkup Recommended yearly dental visit for hygiene and checkup  Vaccinations: Influenza vaccine: done 09/03/18 Pneumococcal vaccine: done 12/17/17 Tdap vaccine: done 2013 Shingles vaccine: Shingrix discussed. Please contact your pharmacy for coverage information.   Advanced directives: Advance directive discussed with you today. Even though you declined this today please call our office should you change your mind and we can give you the proper paperwork for you to fill out.  Conditions/risks identified: Recommend eating 3-4 servings of fresh fruits and vegetables per day.   Next appointment: Please follow up in one year for your Medicare Annual Wellness visit.    Preventive Care 4 Years and Older, Male Preventive care refers to lifestyle choices and visits with your health care provider that can promote health and wellness. What does preventive care include?  A yearly physical exam. This is also called an annual well check.  Dental exams once or twice a year.  Routine eye exams. Ask your health care provider how often you should have your eyes checked.  Personal lifestyle choices, including:  Daily care of your teeth and gums.  Regular physical activity.  Eating a healthy diet.  Avoiding tobacco and drug use.  Limiting alcohol use.  Practicing safe sex.  Taking low doses of aspirin every day.  Taking vitamin and mineral supplements as recommended by your health care provider. What happens during an annual well check? The services and  screenings done by your health care provider during your annual well check will depend on your age, overall health, lifestyle risk factors, and family history of disease. Counseling  Your health care provider may ask you questions about your:  Alcohol use.  Tobacco use.  Drug use.  Emotional well-being.  Home and relationship well-being.  Sexual activity.  Eating habits.  History of falls.  Memory and ability to understand (cognition).  Work and work Statistician. Screening  You may have the following tests or measurements:  Height, weight, and BMI.  Blood pressure.  Lipid and cholesterol levels. These may be checked every 5 years, or more frequently if you are over 37 years old.  Skin check.  Lung cancer screening. You may have this screening every year starting at age 51 if you have a 30-pack-year history of smoking and currently smoke or have quit within the past 15 years.  Fecal occult blood test (FOBT) of the stool. You may have this test every year starting at age 35.  Flexible sigmoidoscopy or colonoscopy. You may have a sigmoidoscopy every 5 years or a colonoscopy every 10 years starting at age 35.  Prostate cancer screening. Recommendations will vary depending on your family history and other risks.  Hepatitis C blood test.  Hepatitis B blood test.  Sexually transmitted disease (STD) testing.  Diabetes screening. This is done by checking your blood sugar (glucose) after you have not eaten for a while (fasting). You may have this done every 1-3 years.  Abdominal aortic aneurysm (AAA) screening. You may need this if you are a current or former smoker.  Osteoporosis. You may be screened starting at age 61 if you are at high risk.  Talk with your health care provider about your test results, treatment options, and if necessary, the need for more tests. Vaccines  Your health care provider may recommend certain vaccines, such as:  Influenza vaccine. This is  recommended every year.  Tetanus, diphtheria, and acellular pertussis (Tdap, Td) vaccine. You may need a Td booster every 10 years.  Zoster vaccine. You may need this after age 44.  Pneumococcal 13-valent conjugate (PCV13) vaccine. One dose is recommended after age 60.  Pneumococcal polysaccharide (PPSV23) vaccine. One dose is recommended after age 4. Talk to your health care provider about which screenings and vaccines you need and how often you need them. This information is not intended to replace advice given to you by your health care provider. Make sure you discuss any questions you have with your health care provider. Document Released: 09/14/2015 Document Revised: 05/07/2016 Document Reviewed: 06/19/2015 Elsevier Interactive Patient Education  2017 [employer] Prevention in the Home Falls can cause injuries. They can happen to people of all ages. There are many things you can do to make your home safe and to help prevent falls. What can I do on the outside of my home?  Regularly fix the edges of walkways and driveways and fix any cracks.  Remove anything that might make you trip as you walk through a door, such as a raised step or threshold.  Trim any bushes or trees on the path to your home.  Use bright outdoor lighting.  Clear any walking paths of anything that might make someone trip, such as rocks or tools.  Regularly check to see if handrails are loose or broken. Make sure that both sides of any steps have handrails.  Any raised decks and porches should have guardrails on the edges.  Have any leaves, snow, or ice cleared regularly.  Use sand or salt on walking paths during winter.  Clean up any spills in your garage right away. This includes oil or grease spills. What can I do in the bathroom?  Use night lights.  Install grab bars by the toilet and in the tub and shower. Do not use towel bars as grab bars.  Use non-skid mats or decals in the tub or  shower.  If you need to sit down in the shower, use a plastic, non-slip stool.  Keep the floor dry. Clean up any water that spills on the floor as soon as it happens.  Remove soap buildup in the tub or shower regularly.  Attach bath mats securely with double-sided non-slip rug tape.  Do not have throw rugs and other things on the floor that can make you trip. What can I do in the bedroom?  Use night lights.  Make sure that you have a light by your bed that is easy to reach.  Do not use any sheets or blankets that are too big for your bed. They should not hang down onto the floor.  Have a firm chair that has side arms. You can use this for support while you get dressed.  Do not have throw rugs and other things on the floor that can make you trip. What can I do in the kitchen?  Clean up any spills right away.  Avoid walking on wet floors.  Keep items that you use a lot in easy-to-reach places.  If you need to reach something above you, use a strong step stool that has a grab bar.  Keep electrical cords out of the way.  Do not use floor polish or wax that makes floors slippery. If you must use wax, use non-skid floor wax.  Do not have throw rugs and other things on the floor that can make you trip. What can I do with my stairs?  Do not leave any items on the stairs.  Make sure that there are handrails on both sides of the stairs and use them. Fix handrails that are broken or loose. Make sure that handrails are as long as the stairways.  Check any carpeting to make sure that it is firmly attached to the stairs. Fix any carpet that is loose or worn.  Avoid having throw rugs at the top or bottom of the stairs. If you do have throw rugs, attach them to the floor with carpet tape.  Make sure that you have a light switch at the top of the stairs and the bottom of the stairs. If you do not have them, ask someone to add them for you. What else can I do to help prevent  falls?  Wear shoes that:  Do not have high heels.  Have rubber bottoms.  Are comfortable and fit you well.  Are closed at the toe. Do not wear sandals.  If you use a stepladder:  Make sure that it is fully opened. Do not climb a closed stepladder.  Make sure that both sides of the stepladder are locked into place.  Ask someone to hold it for you, if possible.  Clearly mark and make sure that you can see:  Any grab bars or handrails.  First and last steps.  Where the edge of each step is.  Use tools that help you move around (mobility aids) if they are needed. These include:  Canes.  Walkers.  Scooters.  Crutches.  Turn on the lights when you go into a dark area. Replace any light bulbs as soon as they burn out.  Set up your furniture so you have a clear path. Avoid moving your furniture around.  If any of your floors are uneven, fix them.  If there are any pets around you, be aware of where they are.  Review your medicines with your doctor. Some medicines can make you feel dizzy. This can increase your chance of falling. Ask your doctor what other things that you can do to help prevent falls. This information is not intended to replace advice given to you by your health care provider. Make sure you discuss any questions you have with your health care provider. Document Released: 06/14/2009 Document Revised: 01/24/2016 Document Reviewed: 09/22/2014 Elsevier Interactive Patient Education  2017 [employer].

## 2018-11-05 NOTE — Progress Notes (Signed)
BP (!) 148/72   Pulse 63   Temp 97.7 F (36.5 C) (Oral)   Resp 12   Ht 6' (1.829 m)   Wt 214 lb 8 oz (97.3 kg)   SpO2 97%   BMI 29.09 kg/m    Subjective:    Patient ID: Victor Fox, male    DOB: 1945-12-11, 73 y.o.   MRN: [id number]  HPI: Victor Fox is a 73 y.o. male  Chief Complaint  Patient presents with  . Follow-up    HPI Here for f/u  BP a little high, "they always say that" Not much salt; no NSAIDs; just tylenol Upon review of his medicines, it turns out that he is not taking two of his three blood pressure medicines (HCTZ and amlodipine); the only bottle he has today is enalapril and he says everything he takes is with him  Type 2 diabetes; going to see foot doctor soon Checking FSBS maybe every day, but misses some day; forgot his meter In the last week or two, highest was over 200; lowest was maybe under 100; nothing really low Referred to endocrinologist and he has not heard anything yet He is on Xultophy and he was giving instructions to titrate that up after the last high A1c... then he says that he quit that shot maybe one month ago; he says it is too expensive; I asked why he did not let us know (to either get samples or help); he couldn't really answer He is using 70/30 now from [place] and taking 30 units before lunch He does eat breakfast, but not today; he usually eats breakfast; I am not sure why he takes this at lunch instead of in the morning (?) He is taking metformin every day, one BID  Lab Results  Component Value Date   HGBA1C 11.7 (H) 10/08/2018    Depression screen [facility] 2/9 11/05/2018 10/08/2018 03/08/2018 01/05/2018 03/30/2017  Decreased Interest 0 0 0 0 0  Down, Depressed, Hopeless 0 0 0 0 0  PHQ - 2 Score 0 0 0 0 0  Altered sleeping 0 0 - - -  Tired, decreased energy 0 0 - - -  Change in appetite 3 0 - - -  Feeling bad or failure about yourself  0 0 - - -  Trouble concentrating 0 0 - - -  Moving slowly or fidgety/restless 0 0 - - -    Suicidal thoughts 0 0 - - -  PHQ-9 Score 3 0 - - -  Difficult doing work/chores Not difficult at all Not difficult at all - - -   Fall Risk  11/05/2018 10/08/2018 03/08/2018 01/05/2018 03/30/2017  Falls in the past year? 0 0 No No No  Comment - - - - -  Number falls in past yr: 0 0 - - -  Injury with Fall? 0 0 - - -  Follow up Falls prevention discussed - - - -    Relevant past medical, surgical, family and social history reviewed Past Medical History:  Diagnosis Date  . Arthritis   . Benign neoplasm of unspecified site   . Carotid artery disease ([place])    a. carotid duplex 05/2015: less tahn 39% stenosis bilaterally, stable over the past year, recommend repeat imaging in 2 years (05/2017)  . Compression fracture    a. L2  . CVA (cerebral vascular accident) ([place]) 06/2012  . Diabetes mellitus without complication ([place])   . Heart murmur    a. echo 06/2012: EF >  55%, LVH, aortic valve sclerosis  . Hyperlipidemia   . Hypertension   . Neuropathy   . Pneumonia   . Tobacco abuse    a. smoked x 25 years  . Vitamin D deficiency    Past Surgical History:  Procedure Laterality Date  . ARTHRODESIS    . BACK SURGERY     x 2  . discectomy    . trigger release     Family History  Problem Relation Age of Onset  . Heart disease Brother   . Hypertension Mother    Social History   Tobacco Use  . Smoking status: Former Smoker    Packs/day: 1.00    Years: 10.00    Pack years: 10.00    Types: Cigarettes  . Smokeless tobacco: Never Used  Substance Use Topics  . Alcohol use: No  . Drug use: No     [facility] Visit from 10/08/2018 in [facility]  AUDIT-C Score  0      Interim medical history since last visit reviewed. Allergies and medications reviewed  Review of Systems  Constitutional: Negative for unexpected weight change.  Endocrine: Negative for polydipsia and polyuria.  Neurological:       No confusion   Per HPI unless specifically indicated above      Objective:    BP (!) 148/72   Pulse 63   Temp 97.7 F (36.5 C) (Oral)   Resp 12   Ht 6' (1.829 m)   Wt 214 lb 8 oz (97.3 kg)   SpO2 97%   BMI 29.09 kg/m   Wt Readings from Last 3 Encounters:  11/05/18 214 lb 8 oz (97.3 kg)  11/05/18 214 lb 8 oz (97.3 kg)  10/08/18 216 lb (98 kg)    Physical Exam Constitutional:      General: He is not in acute distress.    Appearance: He is well-developed.     Comments: overweight  Eyes:     General: No scleral icterus. Cardiovascular:     Rate and Rhythm: Normal rate and regular rhythm.     Pulses:          Dorsalis pedis pulses are 1+ on the right side and 1+ on the left side.  Pulmonary:     Effort: Pulmonary effort is normal.     Breath sounds: Normal breath sounds.  Musculoskeletal:       Feet:  Feet:     Right foot:     Protective Sensation: 5 sites tested. 5 sites sensed.     Skin integrity: No ulcer.     Toenail Condition: Right toenails are abnormally thick and long.     Left foot:     Protective Sensation: 5 sites tested. 5 sites sensed.     Skin integrity: No ulcer.     Toenail Condition: Left toenails are abnormally thick and long.     Comments: Large thickened curved great toenails both feet Skin:    Coloration: Skin is not pale.  Neurological:     Mental Status: He is alert.    Diabetic Foot Form - Detailed   Diabetic Foot Exam - detailed Diabetic Foot exam was performed with the following findings:  Yes 11/05/2018  1:38 PM  Visual Foot Exam completed.:  Yes  Pulse Foot Exam completed.:  Yes  Right Dorsalis Pedis:  Present Left Dorsalis Pedis:  Present  Sensory Foot Exam Completed.:  Yes Semmes-Weinstein Monofilament Test R Site 1-Great Toe:  Pos L Site 1-Great  Toe:  Pos         Results for orders placed or performed in visit on 10/08/18  Hemoglobin A1C  Result Value Ref Range   Hgb A1c MFr Bld 11.7 (H) <5.7 % of total Hgb   Mean Plasma Glucose 289 (calc)   eAG (mmol/L) 16.0 (calc)  Urine  Microalbumin w/creat. ratio  Result Value Ref Range   Creatinine, Urine 64 20 - 320 mg/dL   Microalb, Ur 3.7 mg/dL   Microalb Creat Ratio 58 (H) <30 mcg/mg creat  Lipid panel  Result Value Ref Range   Cholesterol 162 <200 mg/dL   HDL 39 (L) > OR = 40 mg/dL   Triglycerides 117 <150 mg/dL   LDL Cholesterol (Calc) 102 (H) mg/dL (calc)   Total CHOL/HDL Ratio 4.2 <5.0 (calc)   Non-HDL Cholesterol (Calc) 123 <130 mg/dL (calc)  COMPLETE METABOLIC PANEL WITH GFR  Result Value Ref Range   Glucose, Bld 320 (H) 65 - 99 mg/dL   BUN 15 7 - 25 mg/dL   Creat 1.00 0.70 - 1.18 mg/dL   GFR, Est Non African American 75 > OR = 60 mL/min/1.69m2   GFR, Est African American 87 > OR = 60 mL/min/1.37m2   BUN/Creatinine Ratio NOT APPLICABLE 6 - 22 (calc)   Sodium 138 135 - 146 mmol/L   Potassium 4.0 3.5 - 5.3 mmol/L   Chloride 101 98 - 110 mmol/L   CO2 29 20 - 32 mmol/L   Calcium 9.4 8.6 - 10.3 mg/dL   Total Protein 6.3 6.1 - 8.1 g/dL   Albumin 3.8 3.6 - 5.1 g/dL   Globulin 2.5 1.9 - 3.7 g/dL (calc)   AG Ratio 1.5 1.0 - 2.5 (calc)   Total Bilirubin 0.6 0.2 - 1.2 mg/dL   Alkaline phosphatase (APISO) 84 35 - 144 U/L   AST 15 10 - 35 U/L   ALT 15 9 - 46 U/L  CBC with Differential/Platelet  Result Value Ref Range   WBC 9.4 3.8 - 10.8 Thousand/uL   RBC 5.66 4.20 - 5.80 Million/uL   Hemoglobin 16.0 13.2 - 17.1 g/dL   HCT 47.8 38.5 - 50.0 %   MCV 84.5 80.0 - 100.0 fL   MCH 28.3 27.0 - 33.0 pg   MCHC 33.5 32.0 - 36.0 g/dL   RDW 13.7 11.0 - 15.0 %   Platelets 201 140 - 400 Thousand/uL   MPV 9.3 7.5 - 12.5 fL   Neutro Abs 5,236 1,500 - 7,800 cells/uL   Lymphs Abs 3,281 850 - 3,900 cells/uL   Absolute Monocytes 677 200 - 950 cells/uL   Eosinophils Absolute 169 15 - 500 cells/uL   Basophils Absolute 38 0 - 200 cells/uL   Neutrophils Relative % 55.7 %   Total Lymphocyte 34.9 %   Monocytes Relative 7.2 %   Eosinophils Relative 1.8 %   Basophils Relative 0.4 %      Assessment & Plan:   Problem  List Items Addressed This Visit      Cardiovascular and Mediastinum   HTN (hypertension) (Chronic)    Uncontrolled today, but patient just quit taking two of his three antihypertensives; continue ACE-I; start back on the ARB; I'll not start him back on HCTZ just now, but will consider that if BP not controlled on two agents; avoid salt, decongestants, NSAIDS; return in two weeks        Endocrine   Uncontrolled diabetes mellitus with complication, with long-term current use of insulin ([place])    Hypertension  and high cholesterol; patient just stopped using Xultophy cold [place] and did not call for samples or refill; I asked him to please contact us in the future if needed; increase metformin; I asked my staff to check on the referral put in to endocrinology in February      Relevant Medications   metFORMIN (GLUCOPHAGE-XR) 500 MG 24 hr tablet     Other   Pulmonary nodules    He will be due for repeat chest CT first week of April; this has already been ordered; note in AVS for patient to contact office to check on scheduling end of March      Hyperlipidemia (Chronic)    Not at goal; patient never increased the statin from 20 mg to 40 mg; start 40 mg atorvastatin; plan to recheck after 6 weeks when he gets his next set of labs          Follow up plan: Return in about 2 weeks (around 11/19/2018) for follow-up visit with Dr. Sanda Klein; check sugars 2x a day and bring appt.  An after-visit summary was printed and given to the patient at [facility].  Please see the patient instructions which may contain other information and recommendations beyond what is mentioned above in the assessment and plan.  Meds ordered this encounter  Medications  . metFORMIN (GLUCOPHAGE-XR) 500 MG 24 hr tablet    Sig: Take 2 tablets (1,000 mg total) by mouth 2 (two) times daily.    Dispense:  120 tablet    Refill:  0    No orders of the defined types were placed in this encounter.

## 2018-11-05 NOTE — Assessment & Plan Note (Signed)
Uncontrolled today, but patient just quit taking two of his three antihypertensives; continue ACE-I; start back on the ARB; I'll not start him back on HCTZ just now, but will consider that if BP not controlled on two agents; avoid salt, decongestants, NSAIDS; return in two weeks

## 2018-11-05 NOTE — Addendum Note (Signed)
Addended by: Calah Gershman, [name] on: 11/05/2018 02:04 PM   Modules accepted: Level of Service

## 2018-11-05 NOTE — Assessment & Plan Note (Signed)
Hypertension and high cholesterol; patient just stopped using Xultophy cold [place] and did not call for samples or refill; I asked him to please contact us in the future if needed; increase metformin; I asked my staff to check on the referral put in to endocrinology in February

## 2018-11-05 NOTE — Telephone Encounter (Signed)
I have reviewed chart. Cardiology asked to clear pt to hold ASA for colonoscopy. Per records, we have not seen pt since 2017 and prior office notes outline that pt has been on ASA primary due to history of CVA. Clearance to hold ASA needs to come from his PCP or neurologist. I will notify requesting party to seek guidance from PCP as we are not prescribing ASA for this patient.   I will remove from our preop pool.   Lyda Jester, PA-C 11/05/2018

## 2018-11-05 NOTE — Telephone Encounter (Signed)
° °  [facility] Pre-operative Risk Assessment    Request for surgical clearance:  1. What type of surgery is being performed? Colonoscopy    2. When is this surgery scheduled? 11/29/18  3. What type of clearance is required (medical clearance vs. Pharmacy clearance to hold med vs. Both)? pharmacy  4. Are there any medications that need to be held prior to surgery and how long? ASA 325 mg   5. Practice name and name of physician performing surgery? [facility] Dr. Vicente Males   6. What is your office phone number [phone]    7.   What is your office fax number [phone]  8.   Anesthesia type (None, local, MAC, general) ? Not noted    Clarisse Gouge 11/05/2018, 2:14 PM  _________________________________________________________________   (provider comments below)

## 2018-11-05 NOTE — Progress Notes (Signed)
Subjective:   Victor Fox is a 73 y.o. male who presents for an Initial Medicare Annual Wellness Visit.  Review of Systems   Cardiac Risk Factors include: advanced age (>64mn, >>24women);diabetes mellitus;hypertension;dyslipidemia;male gender    Objective:    Today's Vitals   11/05/18 1020  BP: (!) 148/72  Pulse: 63  Resp: 16  Temp: 97.7 F (36.5 C)  TempSrc: Oral  SpO2: 97%  Weight: 214 lb 8 oz (97.3 kg)  Height: 6' (1.829 m)  PainSc: 5    Body mass index is 29.09 kg/m.  Advanced Directives 11/05/2018 09/01/2018 12/23/2017 03/30/2017 12/11/2016 06/16/2016 04/14/2016  Does Patient Have a Medical Advance Directive? _0  No No  Would patient like information on creating a medical advance directive? No - Patient declined No - Patient declined (No Data) - - No - patient declined information No - patient declined information    Current Medications (verified) Outpatient Encounter Medications as of 11/05/2018  Medication Sig  . atorvastatin (LIPITOR) 40 MG tablet Take 1 tablet (40 mg total) by mouth at bedtime. (Patient taking differently: Take 40 mg by mouth at bedtime. Pt still taking 253mdose)  . B-D UF III MINI PEN NEEDLES 31G X 5 MM MISC use once daily (Patient taking differently: Patient is using twice daily)  . enalapril (VASOTEC) 20 MG tablet Take 1 tablet (20 mg total) by mouth daily.  . Insulin Isophane & Regular Human (NOVOLIN 70/30 FLEXPEN) (70-30) 100 UNIT/ML PEN Inject 30 Units into the skin daily. Pt takes daily at 12 noon prior to going to work  . metFORMIN (GLUCOPHAGE-XR) 500 MG 24 hr tablet TAKE 1 TABLET BY MOUTH TWICE DAILY  . amLODipine (NORVASC) 10 MG tablet Take 1 tablet (10 mg total) by mouth daily. Appointment needed please for further refills (Patient not taking: Reported on 11/05/2018)  . aspirin 325 MG tablet Take 325 mg by mouth daily.  . hydrochlorothiazide (HYDRODIURIL) 25 MG tablet Take 0.5 tablets (12.5 mg total) by mouth daily. (Patient not  taking: Reported on 11/05/2018)  . Insulin Degludec-Liraglutide (XULTOPHY) 100-3.6 UNIT-MG/ML SOPN Inject 20 Units into the skin daily. Increase by 2 units every four days until you reach 30 units daily then stay there  . meclizine (ANTIVERT) 25 MG tablet TAKE 1 TABLET(25 MG) BY MOUTH THREE TIMES DAILY AS NEEDED FOR DIZZINESS (Patient not taking: Reported on 11/05/2018)  . [DISCONTINUED] Blood Glucose Monitoring Suppl (FREESTYLE PRECISION NEO SYSTEM) w/Device KIT   . [DISCONTINUED] FREESTYLE PRECISION NEO TEST test strip Use as Directed to check Blood Glucose daily (Patient taking differently: Use as Directed to check Blood Glucose twice daily)  . [DISCONTINUED] Lancets (FREESTYLE) lancets    No facility-administered encounter medications on file as of 11/05/2018.     Allergies (verified) Patient has no known allergies.   History: Past Medical History:  Diagnosis Date  . Arthritis   . Benign neoplasm of unspecified site   . [place] artery [place] ([facility]   a. carotid duplex 05/2015: less tahn 39% stenosis bilaterally, stable over the past year, recommend repeat imaging in 2 years (05/2017)  . Compression fracture    a. L2  . [place]) [place]  . [place] without complication ([facility]  . Heart murmur    a. echo 06/2012: EF >55%, LVH, aortic valve sclerosis  . Hyperlipidemia   . Hypertension   . Neuropathy   . Pneumonia   . Tobacco abuse    a. smoked  x 25 years  . Vitamin D deficiency    Past Surgical History:  Procedure Laterality Date  . ARTHRODESIS    . BACK SURGERY     x 2  . discectomy    . trigger release     Family History  Problem Relation Age of Onset  . Heart disease Brother   . Hypertension Mother    Social History   Socioeconomic History  . Marital status: Single    Spouse name: Not on file  . Number of children: 2  . Years of education: Not on file  . Highest education level: 12th grade  Occupational History    Comment:  full time  Social Needs  . Financial resource strain: Not very hard  . Food insecurity:    Worry: Never true    Inability: Never true  . Transportation needs:    Medical: No    Non-medical: No  Tobacco Use  . Smoking status: Former Smoker    Packs/day: 1.00    Years: 10.00    Pack years: 10.00    Types: Cigarettes  . Smokeless tobacco: Never Used  Substance and Sexual Activity  . Alcohol use: No  . Drug use: No  . Sexual activity: Not Currently  Lifestyle  . Physical activity:    Days per week: 0 days    Minutes per session: 0 min  . Stress: Not at all  Relationships  . Social connections:    Talks on phone: Patient refused    Gets together: Patient refused    Attends religious service: Patient refused    Active member of club or organization: Patient refused    Attends meetings of clubs or organizations: Patient refused    Relationship status: Patient refused  Other Topics Concern  . Not on file  Social History Narrative  . Not on file   Tobacco Counseling Counseling given: Not Answered   Clinical Intake:  Pre-visit preparation completed: Yes  Pain : 0-10 Pain Score: 5  Pain Type: Chronic pain Pain Location: Back Pain Orientation: Lower Pain Descriptors / Indicators: Aching Pain Onset: More than a month ago Pain Frequency: Constant     BMI - recorded: 29.09 Nutritional Status: BMI 25 -29 Overweight Nutritional Risks: None Diabetes: Yes CBG done?: No Did pt. bring in CBG monitor from home?: No   Nutrition Risk Assessment:  Has the patient had any N/V/D within the last 2 months?  No  Does the patient have any non-healing wounds?  No  Has the patient had any unintentional weight loss or weight gain?  No   Diabetes:  Is the patient diabetic?  Yes  If diabetic, was a CBG obtained today?  No  Did the patient bring in their glucometer from home?  No  How often do you monitor your CBG's? Daily when he remembers.   Financial Strains and Diabetes  Management:  Are you having any financial strains with the device, your supplies or your medication? Yes . xultophy too expensive Does the patient want to be seen by Chronic Care Management for management of their diabetes?  No  Would the patient like to be referred to a Nutritionist or for Diabetic Management?  No   Diabetic Exams:  Diabetic Eye Exam: Completed 02/10/18 negative retinopathy.   Diabetic Foot Exam: Completed 10/08/18.   How often do you need to have someone help you when you read instructions, pamphlets, or other written materials from your doctor or pharmacy?: 1 - Never  Interpreter  Needed?: No  Information entered by :: Clemetine Marker LPN  Activities of Daily Living In your present state of health, do you have any difficulty performing the following activities: 11/05/2018 10/08/2018  Hearing? N N  Comment declines hearing aids -  Vision? N N  Comment wears glasses -  Difficulty concentrating or making decisions? N N  Walking or climbing stairs? N N  Dressing or bathing? N N  Doing errands, shopping? N N  Preparing Food and eating ? N -  Using the Toilet? N -  In the past six months, have you accidently leaked urine? Y -  Comment urgency -  Do you have problems with loss of bowel control? N -  Managing your Medications? N -  Managing your Finances? N -  Housekeeping or managing your Housekeeping? N -  Some recent data might be hidden     Immunizations and Health Maintenance Immunization History  Administered Date(s) Administered  . Influenza, High Dose Seasonal PF 06/16/2016, 09/03/2018  . Pneumococcal Conjugate-13 12/17/2017  . Pneumococcal Polysaccharide-23 07/01/2012   Health Maintenance Due  Topic Date Due  . COLONOSCOPY  09/14/2016    Patient Care Team: [name], MD as PCP - General (Family Medicine) Minna Merritts, MD as Consulting Physician (Cardiology) Lollie Sails, MD as Consulting Physician (Gastroenterology)  Indicate any  recent Medical Services you may have received from other than [facility] providers in the past year (date may be approximate).    Assessment:   This is a routine wellness examination for Keniel.  Hearing/Vision screen Hearing Screening Comments: Pt has been evaluated for hearing in the past and was given a hearing aid but does not use it. He denies hearing difficulty  Vision Screening Comments: Annual vision screenings done [facility]  Dietary issues and exercise activities discussed: Current Exercise Habits: The patient has a physically strenuous job, but has no regular exercise apart from work., Exercise limited by: orthopedic condition(s)  Goals    . DIET - EAT MORE FRUITS AND VEGETABLES     Eat 3-4 servings of fresh fruits and vegetables per day      Depression Screen PHQ 2/9 Scores 11/05/2018 10/08/2018 03/08/2018 01/05/2018  PHQ - 2 Score 0 0 0 0  PHQ- 9 Score 3 0 - -    Fall Risk Fall Risk  11/05/2018 10/08/2018 03/08/2018 01/05/2018 03/30/2017  Falls in the past year? 0 0 No No No  Comment - - - - -  Number falls in past yr: 0 0 - - -  Injury with Fall? 0 0 - - -  Follow up Falls prevention discussed - - - -   FALL RISK PREVENTION PERTAINING TO THE HOME:  Any stairs in or around the home? Yes  If so, do they handrails? Yes   Home free of loose throw rugs in walkways, pet beds, electrical cords, etc? Yes  Adequate lighting in your home to reduce risk of falls? Yes   ASSISTIVE DEVICES UTILIZED TO PREVENT FALLS:  Life alert? No  Use of a cane, walker or w/c? No  Grab bars in the bathroom? Yes  Shower chair or bench in shower? Yes  Elevated toilet seat or a handicapped toilet? Yes   DME ORDERS:  DME order needed?  No   TIMED UP AND GO:  Was the test performed? Yes .  Length of time to ambulate 10 feet: 7 sec.   GAIT:  Appearance of gait: Gait stead-fast and without the use of  an assistive device.   Education: Fall risk prevention has been discussed.  Intervention(s)  required? No   Cognitive Function:     6CIT Screen 11/05/2018  What Year? 0 points  What month? 0 points  What time? 0 points  Count back from 20 0 points  Months in reverse 4 points  Repeat phrase 2 points  Total Score 6    Screening Tests Health Maintenance  Topic Date Due  . COLONOSCOPY  09/14/2016  . Hepatitis C Screening  01/06/2019 (Originally [birth year])  . OPHTHALMOLOGY EXAM  02/11/2019  . HEMOGLOBIN A1C  04/08/2019  . FOOT EXAM  10/09/2019  . TETANUS/TDAP  09/01/2021  . INFLUENZA VACCINE  Completed  . PNA vac Low Risk Adult  Completed    Qualifies for Shingles Vaccine? Yes . Due for Shingrix. Education has been provided regarding the importance of this vaccine. Pt has been advised to call insurance company to determine out of pocket expense. Advised may also receive vaccine at local pharmacy or Health Dept. Verbalized acceptance and understanding.  Tdap: Up to date  Flu Vaccine: Up to date  Pneumococcal Vaccine: Up to date   Cancer Screenings:  Colorectal Screening: Completed 09/15/11. Repeat every 5 years. Referral to GI placed today. Pt aware the office will call re: appt.   Lung Cancer Screening: (Low Dose CT Chest recommended if Age 30-80 years, 30 pack-year currently smoking OR have quit w/in 15years.) does not qualify.   Additional Screening:  Hepatitis C Screening: does qualify; due to be completed  Vision Screening: Recommended annual ophthalmology exams for early detection of glaucoma and other disorders of the eye. Is the patient up to date with their annual eye exam?  Yes  Who is the provider or what is the name of the office in which the pt attends annual eye exams? [place] Screening: Recommended annual dental exams for proper oral hygiene  Community Resource Referral:  CRR required this visit?  No       Plan:    I have personally reviewed and addressed the Medicare Annual Wellness questionnaire and have noted the  following in the patient's chart:  A. Medical and social history B. Use of alcohol, tobacco or illicit drugs  C. Current medications and supplements D. Functional ability and status E.  Nutritional status F.  Physical activity G. Advance directives H. List of other physicians I.  Hospitalizations, surgeries, and ER visits in previous 12 months J.  [place] such as hearing and vision if needed, cognitive and depression L. Referrals and appointments   In addition, I have reviewed and discussed with patient certain preventive protocols, quality metrics, and best practice recommendations. A written personalized care plan for preventive services as well as general preventive health recommendations were provided to patient.   Signed,  Clemetine Marker, LPN Nurse Health Advisor   Nurse Notes: pt brought in his medications today but several meds on his list do not match. He states the xultophy is too expensive and taking novolin 70/30 30 units daily. I do not see where this was authorized or prescribed and he did not bring the vial to verify it. Endocrinology referral done early February, no notes that patient has been seen by them. He also did not bring any amlodipine, aspirin or HCTZ so he doesn't think he is taking those. He has not increased atorvastatin to 40 mg and still has 48m bottle. Pt seeing Dr. LSanda Kleintoday for follow up. HM modifier updated to  5 year colonoscopy due to hx of polyps and previous recommendation for 5 year follow up, referral sent to GI.

## 2018-11-05 NOTE — Assessment & Plan Note (Signed)
Not at goal; patient never increased the statin from 20 mg to 40 mg; start 40 mg atorvastatin; plan to recheck after 6 weeks when he gets his next set of labs

## 2018-11-05 NOTE — Assessment & Plan Note (Signed)
He will be due for repeat chest CT first week of April; this has already been ordered; note in AVS for patient to contact office to check on scheduling end of March

## 2018-11-08 ENCOUNTER — Other Ambulatory Visit: Payer: Self-pay

## 2018-11-12 ENCOUNTER — Ambulatory Visit: Payer: Self-pay | Admitting: Podiatry

## 2018-11-17 ENCOUNTER — Telehealth: Payer: Self-pay

## 2018-11-17 NOTE — Telephone Encounter (Signed)
Called pt to inform him that we will be canceling his colonoscopy procedure due to precautions for the Corona virus.  Unable to contact, left detailed VM

## 2018-11-19 ENCOUNTER — Encounter: Admission: RE | Payer: Self-pay | Source: Home / Self Care

## 2018-11-19 ENCOUNTER — Ambulatory Visit: Payer: Medicare HMO | Admitting: Family Medicine

## 2018-11-19 ENCOUNTER — Encounter: Payer: Self-pay | Admitting: Podiatry

## 2018-11-19 ENCOUNTER — Other Ambulatory Visit: Payer: Self-pay

## 2018-11-19 ENCOUNTER — Ambulatory Visit: Admission: RE | Admit: 2018-11-19 | Payer: Medicare HMO | Source: Home / Self Care | Admitting: Gastroenterology

## 2018-11-19 ENCOUNTER — Ambulatory Visit: Payer: Medicare HMO | Admitting: Podiatry

## 2018-11-19 DIAGNOSIS — B351 Tinea unguium: Secondary | ICD-10-CM | POA: Diagnosis not present

## 2018-11-19 DIAGNOSIS — M79676 Pain in unspecified toe(s): Secondary | ICD-10-CM | POA: Diagnosis not present

## 2018-11-19 DIAGNOSIS — E0843 Diabetes mellitus due to underlying condition with diabetic autonomic (poly)neuropathy: Secondary | ICD-10-CM

## 2018-11-19 SURGERY — COLONOSCOPY WITH PROPOFOL
Anesthesia: General

## 2018-11-22 ENCOUNTER — Ambulatory Visit: Payer: Medicare HMO | Admitting: Family Medicine

## 2018-11-23 ENCOUNTER — Other Ambulatory Visit: Payer: Self-pay

## 2018-11-23 ENCOUNTER — Ambulatory Visit (INDEPENDENT_AMBULATORY_CARE_PROVIDER_SITE_OTHER): Payer: Medicare HMO | Admitting: Nurse Practitioner

## 2018-11-23 ENCOUNTER — Encounter: Payer: Self-pay | Admitting: Nurse Practitioner

## 2018-11-23 VITALS — BP 138/80 | HR 69 | Temp 97.4°F | Resp 12 | Ht 72.0 in | Wt 218.7 lb

## 2018-11-23 DIAGNOSIS — E118 Type 2 diabetes mellitus with unspecified complications: Secondary | ICD-10-CM

## 2018-11-23 DIAGNOSIS — Z794 Long term (current) use of insulin: Secondary | ICD-10-CM | POA: Diagnosis not present

## 2018-11-23 DIAGNOSIS — IMO0002 Reserved for concepts with insufficient information to code with codable children: Secondary | ICD-10-CM

## 2018-11-23 DIAGNOSIS — E1165 Type 2 diabetes mellitus with hyperglycemia: Secondary | ICD-10-CM | POA: Diagnosis not present

## 2018-11-23 DIAGNOSIS — I10 Essential (primary) hypertension: Secondary | ICD-10-CM | POA: Diagnosis not present

## 2018-11-23 LAB — GLUCOSE, POCT (MANUAL RESULT ENTRY): POC Glucose: 282 mg/dl — AB (ref 70–99)

## 2018-11-23 MED ORDER — BLOOD GLUCOSE MONITOR KIT: PACK | 0 refills | Status: AC

## 2018-11-23 MED ORDER — SEMAGLUTIDE(0.25 OR 0.5MG/DOS) 2 MG/1.5ML ~~LOC~~ SOPN: 0.5000 mg | PEN_INJECTOR | SUBCUTANEOUS | 1 refills | Status: DC

## 2018-11-23 NOTE — Progress Notes (Signed)
   SUBJECTIVE Patient with a history of diabetes mellitus presents to office today complaining of elongated, thickened nails that cause pain while ambulating in shoes. He is unable to trim his own nails. Patient is here for further evaluation and treatment.   Past Medical History:  Diagnosis Date  . Arthritis   . Benign neoplasm of unspecified site   . Carotid artery disease ([place])    a. carotid duplex 05/2015: less tahn 39% stenosis bilaterally, stable over the past year, recommend repeat imaging in 2 years (05/2017)  . Compression fracture    a. L2  . CVA (cerebral vascular accident) ([place]) 06/2012  . Diabetes mellitus without complication ([place])   . Heart murmur    a. echo 06/2012: EF >55%, LVH, aortic valve sclerosis  . Hyperlipidemia   . Hypertension   . Neuropathy   . Pneumonia   . Tobacco abuse    a. smoked x 25 years  . Vitamin D deficiency     OBJECTIVE General Patient is awake, alert, and oriented x 3 and in no acute distress. Derm Skin is dry and supple bilateral. Negative open lesions or macerations. Remaining integument unremarkable. Nails are tender, long, thickened and dystrophic with subungual debris, consistent with onychomycosis, 1-5 bilateral. No signs of infection noted. Vasc  DP and PT pedal pulses palpable bilaterally. Temperature gradient within normal limits.  Neuro Epicritic and protective threshold sensation diminished bilaterally.  Musculoskeletal Exam No symptomatic pedal deformities noted bilateral. Muscular strength within normal limits.  ASSESSMENT 1. Diabetes Mellitus w/ peripheral neuropathy 2. Onychomycosis of nail due to dermatophyte bilateral 3. Pain in foot bilateral  PLAN OF CARE 1. Patient evaluated today. 2. Instructed to maintain good pedal hygiene and foot care. Stressed importance of controlling blood sugar.  3. Mechanical debridement of nails 1-5 bilaterally performed using a nail nipper. Filed with dremel without incident.  4. Return  to clinic in 3 mos.     Edrick Kins, DPM [facility]  Dr. Edrick Kins, [address]                                        [place], [place] [address]                Office [phone]  Fax [phone]

## 2018-11-23 NOTE — Patient Instructions (Addendum)
Please call [facility] to schedule your appointment with a specialist because your A1C and blood sugars are very high. They have called you multiple times without success. You can reach them at [phone]  - Continue taking 2 tablets of metformin (a total of 1000mg ) in the morning and 2 tablets in the evening, for a total of 2,000mg  a day or 4 tablets.  - Start taking ozempic 0.25mg  every Saturday for 4 week. Call us on or around April 20th at [phone] to update [place] on how you are taking the medication and what your blood sugar readings are. After 4 weeks we will go up on your dose to 0.5mg  every Saturday. Once you clean the area on your abdomen, please attach need to the device and twist the plunger until it says 0.25. Remove the needle cover and inject and hold in place for 15 seconds before removing to allow all the medication to enter. Cover the injection site. Rotate injection sites every week.    Your goal blood pressure is less than 140 mmHg on top. Try to follow the DASH guidelines (DASH stands for Dietary Approaches to Stop Hypertension) Try to limit the sodium in your diet.  Ideally, consume less than 1.5 grams (less than 1,500mg ) per day. Do not add salt when cooking or at the table.  Check the sodium amount on labels when shopping, and choose items lower in sodium when given a choice. Avoid or limit foods that already contain a lot of sodium. Eat a diet rich in fruits and vegetables and whole grains.

## 2018-11-23 NOTE — Progress Notes (Signed)
Name: Victor Fox   MRN: [id number]    DOB: 1945/09/27   Date:11/23/2018       Progress Note  Subjective  Chief Complaint  Chief Complaint  Patient presents with  . Follow-up    2 weeks    HPI  Hypertension Had recently stopped taking HCTZ and amlodipine. He was started back on amlodipine last visit and instructed to return for follow-up. Patient is unsure of what he is taking, states he went to the pharmacy after last visit and they didn't have anything for him. States additionally he did not take his blood pressure medication this morning.  Denies headache, chest pain, dizziness BP Readings from Last 3 Encounters:  11/23/18 138/80  11/05/18 (!) 148/72  11/05/18 (!) 148/72     Diabetes  Patient is taking 2057m of metformin daily and 30 units of novolin 70/30 daily- he self titrated himself from xultophy. He did increase his metformin to 2000 mg daily. States his blood sugar meter is broke so he hasn't checked it since then. Patient states he ate a piece of banana bread today. His A1C was 11.7 one month ago.  Denies polyphagia, polydipsia, polyuria.   Abnormal Chest Xray  From 10/2018 with the following impression: 1. Persistent density left upper lung. This could represent persistent pneumonia or tumor. No interim improvement.  2. Mild left base subsegmental atelectasis/infiltrate. Interim slight clearing from prior exam. Underlying tumor can not be excluded.  3. Nodular densities noted on prior CT of 09/01/2018 best identified by prior CT. Persistent nodular density noted over the right lung base.  Patient denies shortness of breath, fevers, chills, cough. CT scan ordered.   Wt Readings from Last 3 Encounters:  11/23/18 218 lb 11.2 oz (99.2 kg)  11/05/18 214 lb 8 oz (97.3 kg)  11/05/18 214 lb 8 oz (97.3 kg)    PHQ2/9: Depression screen [facility] 11/05/2018 10/08/2018 03/08/2018 01/05/2018 03/30/2017  Decreased Interest 0 0 0 0 0  Down, Depressed, Hopeless 0 0 0 0 0  PHQ  - 2 Score 0 0 0 0 0  Altered sleeping 0 0 - - -  Tired, decreased energy 0 0 - - -  Change in appetite 3 0 - - -  Feeling bad or failure about yourself  0 0 - - -  Trouble concentrating 0 0 - - -  Moving slowly or fidgety/restless 0 0 - - -  Suicidal thoughts 0 0 - - -  PHQ-9 Score 3 0 - - -  Difficult doing work/chores Not difficult at all Not difficult at all - - -     PHQ reviewed. Negative  Patient Active Problem List   Diagnosis Date Noted  . Adenomatous polyp 11/05/2018  . Blood pressure elevated 11/05/2018  . Neuropathy 11/05/2018  . Pneumococcal vaccination given 11/05/2018  . Pulmonary nodules 10/08/2018  . Coronary artery disease 10/08/2018  . CAP (community acquired pneumonia) 09/01/2018  . Overweight (BMI 25.0-29.9) 03/10/2018  . 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 Benign neoplasm 01/19/2018  . Onychomycosis due to dermatophyte 01/19/2018  . 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 Neck pain 01/19/2018  . Vitamin D deficiency 01/19/2018  . Aortic stenosis, mild 01/05/2018  . 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 Vertigo 12/17/2017  . Uncontrolled diabetes mellitus with complication, with long-term current use of insulin ([place] [place] 12/17/2017  . Hyperlipidemia 04/14/2016  . Pain and swelling of left wrist 07/10/2015  . Carotid artery disease ([place]   . Heart  murmur 10/03/2013  . HTN (hypertension) 10/03/2013  . CVA (cerebral vascular accident) ([place]) 10/03/2013  . Carotid artery stenosis 10/03/2013    Past Medical History:  Diagnosis Date  . Arthritis   . Benign neoplasm of unspecified site   . Carotid artery disease ([place])    a. carotid duplex 05/2015: less tahn 39% stenosis bilaterally, stable over the past year, recommend repeat imaging in 2 years (05/2017)  . Compression fracture    a. L2  . CVA (cerebral vascular accident) ([place]) 06/2012  . Diabetes mellitus without complication ([place])   . Heart murmur    a. echo 06/2012: EF  >55%, LVH, aortic valve sclerosis  . Hyperlipidemia   . Hypertension   . Neuropathy   . Pneumonia   . Tobacco abuse    a. smoked x 25 years  . Vitamin D deficiency     Past Surgical History:  Procedure Laterality Date  . ARTHRODESIS    . BACK SURGERY     x 2  . discectomy    . trigger release      Social History   Tobacco Use  . Smoking status: Former Smoker    Packs/day: 1.00    Years: 10.00    Pack years: 10.00    Types: Cigarettes  . Smokeless tobacco: Never Used  Substance Use Topics  . Alcohol use: No     Current Outpatient Medications:  .  amLODipine (NORVASC) 10 MG tablet, Take 1 tablet (10 mg total) by mouth daily. Appointment needed please for further refills, Disp: 5 tablet, Rfl: 0 .  aspirin 325 MG tablet, Take 325 mg by mouth daily., Disp: , Rfl:  .  atorvastatin (LIPITOR) 40 MG tablet, Take 1 tablet (40 mg total) by mouth at bedtime. (Patient taking differently: Take 40 mg by mouth at bedtime. Pt still taking '20mg'$  dose), Disp: 90 tablet, Rfl: 1 .  B-D UF III MINI PEN NEEDLES 31G X 5 MM MISC, use once daily (Patient taking differently: Patient is using twice daily), Disp: 100 each, Rfl: PRN .  enalapril (VASOTEC) 20 MG tablet, Take 1 tablet (20 mg total) by mouth daily., Disp: 90 tablet, Rfl: 1 .  Insulin Isophane & Regular Human (NOVOLIN 70/30 FLEXPEN) (70-30) 100 UNIT/ML PEN, Inject 30 Units into the skin daily. Pt takes daily at 12 noon prior to going to work, Disp: , Rfl:  .  meclizine (ANTIVERT) 25 MG tablet, TAKE 1 TABLET(25 MG) BY MOUTH THREE TIMES DAILY AS NEEDED FOR DIZZINESS, Disp: 15 tablet, Rfl: 0 .  metFORMIN (GLUCOPHAGE-XR) 500 MG 24 hr tablet, Take 2 tablets (1,000 mg total) by mouth 2 (two) times daily., Disp: 120 tablet, Rfl: 0  No Known Allergies  ROS   No other specific complaints in a complete review of systems (except as listed in HPI above).  Objective  Vitals:   11/23/18 1016  BP: 138/80  Pulse: 69  Resp: 12  Temp: (!) 97.4  F (36.3 C)  SpO2: 99%  Weight: 218 lb 11.2 oz (99.2 kg)  Height: 6' (1.829 m)     Body mass index is 29.66 kg/m.  Nursing Note and Vital Signs reviewed.  Physical Exam Vitals signs reviewed.  Constitutional:      Appearance: He is well-developed.  HENT:     Head: Normocephalic and atraumatic.  Neck:     Musculoskeletal: Normal range of motion and neck supple.     Vascular: No carotid bruit.  Cardiovascular:     Heart sounds: Normal  heart sounds.  Pulmonary:     Effort: Pulmonary effort is normal.     Breath sounds: Normal breath sounds.  Abdominal:     General: Bowel sounds are normal.     Palpations: Abdomen is soft.     Tenderness: There is no abdominal tenderness.  Musculoskeletal: Normal range of motion.  Skin:    General: Skin is warm and dry.     Capillary Refill: Capillary refill takes less than 2 seconds.  Neurological:     Mental Status: He is alert and oriented to person, place, and time.     GCS: GCS eye subscore is 4. GCS verbal subscore is 5. GCS motor subscore is 6.     Sensory: No sensory deficit.  Psychiatric:        Speech: Speech normal.        Behavior: Behavior normal.        Thought Content: Thought content normal.        Judgment: Judgment normal.        No results found for this or any previous visit (from the past 48 hour(s)).  Assessment & Plan  1. Uncontrolled diabetes mellitus with complication, with long-term current use of insulin ([place]) Given number to schedule endocrinology appointment, due to pandemic will go ahead and start him on GLP-1, given instructions for use in office and given sample. Patient verbalizes understanding and able to repeat back. Patient reports no persona/family history of thyroid cancer or pancreatitis.  - POCT Glucose (CBG) - blood glucose meter kit and supplies KIT; Dispense based on patient and insurance preference. Use up to four times daily as directed. (FOR ICD-9 250.00, 250.01).  Dispense: 1 each;  Refill: 0 - Ambulatory referral to Chronic Care Management Services - Semaglutide,0.25 or 0.5MG/DOS, (OZEMPIC, 0.25 OR 0.5 MG/DOSE,) 2 MG/1.5ML SOPN; Inject 0.5 mg into the skin once a week.  Dispense: 1 pen; Refill: 1  2. Uncontrolled hypertension Controlled today, continue medication regimen, reduce sodium.  - Ambulatory referral to Chronic Care Management Services  Face-to-face time with patient was more than 25 minutes, >50% time spent counseling and coordination of care

## 2018-11-28 ENCOUNTER — Other Ambulatory Visit: Payer: Self-pay | Admitting: Nurse Practitioner

## 2018-11-28 DIAGNOSIS — I10 Essential (primary) hypertension: Secondary | ICD-10-CM

## 2018-12-03 ENCOUNTER — Ambulatory Visit: Payer: Self-pay

## 2018-12-03 NOTE — Patient Instructions (Signed)
1. Thank You for allowing the CCM (Chronic Care Management) Team to assist you with your healthcare goals!! We look forward to speaking with you on 12/07/2018 at 10;00 2. Please have ALL medications available at your appointment! If you have a blood sugar meter or a blood pressure monitor at home, bring those as well.  3.  Contact the CCM Team if you have any question or need to reschedule your initial visit.  CCM (Chronic Care Management) Team   Trish Fountain RN, BSN Nurse Care Coordinator  [phone]  Ruben Reason PharmD  Clinical Pharmacist  [phone]   Elliot Gurney, LCSW Clinical Social Worker [phone]  Mr. Sabedra was given information about Chronic Care Management services today including:  1. CCM service includes personalized support from designated clinical staff supervised by his physician, including individualized plan of care and coordination with other care providers 2. 24/7 contact phone numbers for assistance for urgent and routine care needs. 3. Service will only be billed when office clinical staff spend 20 minutes or more in a month to coordinate care. 4. Only one practitioner may furnish and bill the service in a calendar month. 5. The patient may stop CCM services at any time (effective at the end of the month) by phone call to the office staff. 6. The patient will be responsible for cost sharing (co-pay) of up to 20% of the service fee (after annual deductible is met).  Patient agreed to services and verbal consent obtained.

## 2018-12-03 NOTE — Chronic Care Management (AMB) (Signed)
  Chronic Care Management   Note  12/03/2018 Name: TYREAK REAGLE MRN: [id number] DOB: 23-Jul-1946  NATAN HARTOG is a 73 year old male who sees Dr. Enid Derry for primary care. [name] NP asked the CCM team to consult the patient for chronic care management and care coordination secondary to needing glucometer, medication and disease management for DM. Patient has a history of but not limited to HTN, CVA, Uncontrolled DM, Hyperlipidemia, and pulmonary nodules. Referral was placed 11/23/2018 during last office visit.Telephone outreach to patient today to introduce CCM services.  Mr. Reffner was given information about Chronic Care Management services today including:  1. CCM service includes personalized support from designated clinical staff supervised by his physician, including individualized plan of care and coordination with other care providers 2. 24/7 contact phone numbers for assistance for urgent and routine care needs. 3. Service will only be billed when office clinical staff spend 20 minutes or more in a month to coordinate care. 4. Only one practitioner may furnish and bill the service in a calendar month. 5. The patient may stop CCM services at any time (effective at the end of the month) by phone call to the office staff. 6. The patient will be responsible for cost sharing (co-pay) of up to 20% of the service fee (after annual deductible is met).  Patient agreed to services and verbal consent obtained.      Plan: Initial RN CM assessment scheduled for 12/07/2018 at 10:00  Nihaal Friesen E. Rollene Rotunda, RN, BSN Nurse Care Coordinator [facility] Care Management  [phone]

## 2018-12-07 ENCOUNTER — Ambulatory Visit: Payer: Self-pay | Admitting: Pharmacist

## 2018-12-07 ENCOUNTER — Other Ambulatory Visit: Payer: Self-pay

## 2018-12-07 ENCOUNTER — Ambulatory Visit (INDEPENDENT_AMBULATORY_CARE_PROVIDER_SITE_OTHER): Payer: Self-pay

## 2018-12-07 DIAGNOSIS — IMO0002 Reserved for concepts with insufficient information to code with codable children: Secondary | ICD-10-CM

## 2018-12-07 DIAGNOSIS — I739 Peripheral vascular disease, unspecified: Secondary | ICD-10-CM

## 2018-12-07 DIAGNOSIS — I10 Essential (primary) hypertension: Secondary | ICD-10-CM

## 2018-12-07 DIAGNOSIS — E669 Obesity, unspecified: Secondary | ICD-10-CM

## 2018-12-07 DIAGNOSIS — E1165 Type 2 diabetes mellitus with hyperglycemia: Secondary | ICD-10-CM

## 2018-12-07 DIAGNOSIS — E118 Type 2 diabetes mellitus with unspecified complications: Principal | ICD-10-CM

## 2018-12-07 DIAGNOSIS — E785 Hyperlipidemia, unspecified: Secondary | ICD-10-CM

## 2018-12-07 DIAGNOSIS — I6521 Occlusion and stenosis of right carotid artery: Secondary | ICD-10-CM

## 2018-12-07 DIAGNOSIS — Z794 Long term (current) use of insulin: Secondary | ICD-10-CM

## 2018-12-07 DIAGNOSIS — I779 Disorder of arteries and arterioles, unspecified: Secondary | ICD-10-CM

## 2018-12-07 NOTE — Chronic Care Management (AMB) (Signed)
Chronic Care Management   Initial Visit Note  12/07/2018 Name: Victor Fox MRN: [id number] DOB: [birth year]  Subjective: "I'm not sure what I am supposed to be taking" "I don't have any family that checks on my. My children don't call me"  Objective:  BP Readings from Last 3 Encounters:  11/23/18 138/80  11/05/18 (!) 148/72  11/05/18 (!) 148/72   Lab Results  Component Value Date   HGBA1C 11.7 (H) 10/08/2018   Lab Results  Component Value Date   CHOL 162 10/08/2018   HDL 39 (L) 10/08/2018   LDLCALC 102 (H) 10/08/2018   TRIG 117 10/08/2018   CHOLHDL 4.2 10/08/2018    Assessment:  Victor Fox is a 73 year old malewho sees Dr. Enid Derry for primary care. Victor Fox NPasked the CCM team to consult the patient for chronic care management and care coordination secondary to needing glucometer, medication and disease management for DM. Patient has a history of but not limited to HTN, CVA, Uncontrolled DM, Hyperlipidemia, and pulmonary nodules. Referral was placed3/24/2020 during last office visit. Today CCM RN CM followed up with Victor Fox to establish health goals.  Goals Addressed            This Visit's Progress   . I don't have anyone to make medical decisions for me (pt-stated)       Victor Fox states he is not married. He has two children but estranged from them. He has two "caregivers" that come in every day to clean his home and prepare his meals. He is unsure who would make medical decisions for him in the event he is unable to make decisions for himself. Discussed Advanced Care Planning and Victor Fox understands he needs to put his wishes in writing.   Current Barriers:  . Limited education about the importance of naming a healthcare power of attorney . No designated support person willing to assume healthcare power of attorney  Clinical Social Work Clinical Goal(s):  [name] Over the next 30 days, patient will verbalize basic understanding of Advanced  Directives and importance of completion . Over the next 30 days, the patient will complete mailed Advance Directive packet with the assistance of SW . Over the next 45 days, the patient will have Advance Directive notarized and provide a copy to his provider   Interventions: . Interviewed patient about Financial controller and provided education about the importance of completing advanced directives . Mailed the patient an EMMI educational handout on Advance Directives as well as an Forensic scientist packet  Patient Self Care Activities:  . Complete Advanced Directives and provide copy to PCP  Initial goal documentation      . I really don't know why my sugars are up (pt-stated)       Victor Fox does not understand how diet, lack of exercise, and medication non-adherence contributes to his A1C of 11.7. CCM RN CM reconciled his medications. He is unsure how much ozempic to take, he is taking 1/2 the prescribed dose of metformin, he forgets to take his medications at times, he does not follow "any special diet", and does not engage in any time of additional movement/exercise. He does not understand how uncontrolled diabetes can contribute to stroke and heart attack.  Victor Fox states he needs assistance with house keeping and food preparation. He continues to work part-time. He eats breakfast out, usually at [employer]. His caregivers/housekeepers prepare his lunch and dinner. His caregivers are present during this telephone encounter and per  Victor Fox they are willing to be educated on DM diet and meal preparation. He request information to be place in the mail for their review.   Current Barriers:  [name] Knowledge Deficits related to basic Diabetes pathophysiology and self care/management . Lack of understanding how to use personal glucometer to monitor blood sugar . Medication compliance . Knowledge Deficit related to know what medications he should be taking and dosages .   Nurse Case  Manager Clinical Goal(s):  [name] Over the next 14 days, patient will demonstrate improved adherence to prescribed treatment plan for diabetes self care/management as evidenced by checking blood glucose levels as prescribed, adhering to ADA/low carb diet, daily exercise, and medication adherence  Interventions:  . Provided education to patient re: diabetes and meaning of recent A1C lab . Provided assistance with glucometer set up and instructed patient on use. . Reviewed medications with patient and discussed importance of medication adherence . Referral to [facility] Pharmacist . Discussed plans with patient for ongoing care management follow up and provided patient with direct contact information for care management team . Provided patient with written educational materials related to Diabetes management and ADA Diet via mail . Reviewed scheduled/upcoming provider appointments including:  . Advised patient, providing education and rationale, to check cbg daily and record, calling Dr. Sanda Klein or CCM RN CM for findings outside established parameters.   . Provided emotional support and reassurance related to COVID-19 . Reviewed CDC guidelines for COVID-19 infection prevention including hand hygiene, social distancing, and symptom reporting  Patient Self Care Activities:  . Self administers medications as prescribed . Attends all scheduled provider appointments . Checks blood sugars as prescribed and utilize hyper and hypoglycemia protocol as needed . Adhere to ADA diet as discussed . Adhere to CDC guidelines for COVID-19 infection prevention  Plan:  . Patient will engage with [facility] Pharmacist  . RNCM will provided education mail and follow up in 2 weeks   Initial goal documentation          . The pharmacy did not have the medicine she told me to take (pt-stated)       Current Barriers:  [name] Knowledge Deficits related to understanding what medications patient should be taking and how  much . Lacks caregiver support.  . Non-adherence to prescribed medication regimen . Difficulty obtaining medications  Nurse Case Manager Clinical Goal(s):  [name] Over the next 7 days, patient will work with CM team pharmacist to correct med adherence, medication availability, and understanding medication dosages and rational for use  Interventions:  . Pharmacy referral for medication adherence, assessability, and knowledge of medications prescribed  Patient Self Care Activities:  . Self administers medications as prescribed . Attends all scheduled provider appointments . Calls pharmacy for medication refills . Calls provider office for new concerns or questions  Initial goal documentation         Follow up plan:  Telephone follow up appointment with CCM team member scheduled for: 2 weeks   Mr. Leverette was given information about Chronic Care Management services today including:  1. CCM service includes personalized support from designated clinical staff supervised by his physician, including individualized plan of care and coordination with other care providers 2. 24/7 contact phone numbers for assistance for urgent and routine care needs. 3. Service will only be billed when office clinical staff spend 20 minutes or more in a month to coordinate care. 4. Only one practitioner may furnish and bill the service in a calendar month.  5. The patient may stop CCM services at any time (effective at the end of the month) by phone call to the office staff. 6. The patient will be responsible for cost sharing (co-pay) of up to 20% of the service fee (after annual deductible is met).  Patient agreed to services and verbal consent obtained.   Kristina Mcnorton E. Rollene Rotunda, RN, BSN Nurse Care Coordinator [facility] Care Management  [phone]

## 2018-12-07 NOTE — Patient Instructions (Addendum)
Thank you allowing the Chronic Care Management Team to be a part of your care! It was a pleasure speaking with you today!  1. Check you blood sugars daily, before you eat your morning meal and PLEASE RECORD 2. Work with Pharmacist to get your medications and dosages correct  3. Take your medications as prescribed 4. Please review education materials provided to you by mail. We will discuss during our next telephone visit 5. Try to exercise (walk daily). Walking around your house or outdoors will lower your bloodsugars. 6. Cut back to 1 diet soda a day. Replace all your other fluids with water. This will also keep you hydrated and bring your sugars down. 7. Try to eat more meals at home instead of out. I have provided a meal planning guide to help. 8. Please consider filling out your Advanced Directives, specifically your Living Will  CCM (Chronic Care Management) Team   Trish Fountain RN, BSN Nurse Care Coordinator  [phone]  Ruben Reason PharmD  Clinical Pharmacist  [phone]   [place], LCSW Clinical Social Worker [phone]  Goals Addressed            This Visit's Progress    I don't have anyone to make medical decisions for me (pt-stated)       Current Barriers:   Limited education about the importance of naming a healthcare power of attorney  No designated support person willing to assume healthcare power of attorney  Clinical Social Work Clinical Goal(s):   Over the next 30 days, patient will verbalize basic understanding of Advanced Directives and importance of completion  Over the next 30 days, the patient will complete mailed Advance Directive packet with the assistance of SW  Over the next 45 days, the patient will have Advance Directive notarized and provide a copy to his provider   Interventions:  Interviewed patient about Financial controller and provided education about the importance of completing advanced directives  Mailed the  patient an EMMI educational handout on Advance Directives as well as an Emergency planning/management officer  Patient Self Care Activities:   Complete Advanced Directives and provide copy to PCP  Initial goal documentation       I really don't know why my sugars are up (pt-stated)       Current Barriers:   Knowledge Deficits related to basic Diabetes pathophysiology and self care/management  Lack of understanding how to use personal glucometer to monitor blood sugar  Medication compliance  Knowledge Deficit related to know what medications he should be taking and dosages    Nurse Case Manager Clinical Goal(s):   Over the next 14 days, patient will demonstrate improved adherence to prescribed treatment plan for diabetes self care/management as evidenced by checking blood glucose levels as prescribed, adhering to ADA/low carb diet, daily exercise, and medication adherence  Interventions:   Provided education to patient re: diabetes and meaning of recent A1C lab  Provided assistance with glucometer set up and instructed patient on use.  Reviewed medications with patient and discussed importance of medication adherence  Referral to [facility] Pharmacist  Discussed plans with patient for ongoing care management follow up and provided patient with direct contact information for care management team  Provided patient with written educational materials related to Diabetes management and ADA Diet via mail  Reviewed scheduled/upcoming provider appointments including:   Advised patient, providing education and rationale, to check cbg daily and record, calling Dr. Sanda Klein or CCM RN CM for findings outside  established parameters.    Provided emotional support and reassurance related to COVID-19  Reviewed CDC guidelines for COVID-19 infection prevention including hand hygiene, social distancing, and symptom reporting  Patient Self Care Activities:   Self administers medications as  prescribed  Attends all scheduled provider appointments  Checks blood sugars as prescribed and utilize hyper and hypoglycemia protocol as needed  Adhere to ADA diet as discussed  Adhere to CDC guidelines for COVID-19 infection prevention  Plan:   Patient will engage with [place] will provided education mail and follow up in 2 weeks   Initial goal documentation           The pharmacy did not have the medicine she told me to take (pt-stated)       Current Barriers:   Knowledge Deficits related to understanding what medications patient should be taking and how much  Lacks caregiver support.   Non-adherence to prescribed medication regimen  Difficulty obtaining medications  Nurse Case Manager Clinical Goal(s):   Over the next 7 days, patient will work with CM team pharmacist to correct med adherence, medication availability, and understanding medication dosages and rational for use  Interventions:   Pharmacy referral for medication adherence, assessability, and knowledge of medications prescribed  Patient Self Care Activities:   Self administers medications as prescribed  Attends all scheduled provider appointments  Calls pharmacy for medication refills  Calls provider office for new concerns or questions  Initial goal documentation        Print copy of patient instructions provided.   Telephone follow up appointment with CCM team member scheduled for: 2 weeks   Diabetes Mellitus and Nutrition, Adult When you have diabetes (diabetes mellitus), it is very important to have healthy eating habits because your blood sugar (glucose) levels are greatly affected by what you eat and drink. Eating healthy foods in the appropriate amounts, at about the same times every day, can help you:  Control your blood glucose.  Lower your risk of heart disease.  Improve your blood pressure.  Reach or maintain a healthy weight. Every person with  diabetes is different, and each person has different needs for a meal plan. Your health care provider may recommend that you work with a diet and nutrition specialist (dietitian) to make a meal plan that is best for you. Your meal plan may vary depending on factors such as:  The calories you need.  The medicines you take.  Your weight.  Your blood glucose, blood pressure, and cholesterol levels.  Your activity level.  Other health conditions you have, such as heart or kidney disease. How do carbohydrates affect me? Carbohydrates, also called carbs, affect your blood glucose level more than any other type of food. Eating carbs naturally raises the amount of glucose in your blood. Carb counting is a method for keeping track of how many carbs you eat. Counting carbs is important to keep your blood glucose at a healthy level, especially if you use insulin or take certain oral diabetes medicines. It is important to know how many carbs you can safely have in each meal. This is different for every person. Your dietitian can help you calculate how many carbs you should have at each meal and for each snack. Foods that contain carbs include:  Bread, cereal, rice, pasta, and crackers.  Potatoes and corn.  Peas, beans, and lentils.  Milk and yogurt.  Fruit and juice.  Desserts, such as cakes, cookies, ice cream, and candy.  How does alcohol affect me? Alcohol can cause a sudden decrease in blood glucose (hypoglycemia), especially if you use insulin or take certain oral diabetes medicines. Hypoglycemia can be a life-threatening condition. Symptoms of hypoglycemia (sleepiness, dizziness, and confusion) are similar to symptoms of having too much alcohol. If your health care provider says that alcohol is safe for you, follow these guidelines:  Limit alcohol intake to no more than 1 drink per day for nonpregnant women and 2 drinks per day for men. One drink equals 12 oz of beer, 5 oz of wine, or 1 oz  of hard liquor.  Do not drink on an empty stomach.  Keep yourself hydrated with water, diet soda, or unsweetened iced tea.  Keep in mind that regular soda, juice, and other mixers may contain a lot of sugar and must be counted as carbs. What are tips for following this plan?  Reading food labels  Start by checking the serving size on the "Nutrition Facts" label of packaged foods and drinks. The amount of calories, carbs, fats, and other nutrients listed on the label is based on one serving of the item. Many items contain more than one serving per package.  Check the total grams (g) of carbs in one serving. You can calculate the number of servings of carbs in one serving by dividing the total carbs by 15. For example, if a food has 30 g of total carbs, it would be equal to 2 servings of carbs.  Check the number of grams (g) of saturated and trans fats in one serving. Choose foods that have low or no amount of these fats.  Check the number of milligrams (mg) of salt (sodium) in one serving. Most people should limit total sodium intake to less than 2,300 mg per day.  Always check the nutrition information of foods labeled as "low-fat" or "nonfat". These foods may be higher in added sugar or refined carbs and should be avoided.  Talk to your dietitian to identify your daily goals for nutrients listed on the label. Shopping  Avoid buying canned, premade, or processed foods. These foods tend to be high in fat, sodium, and added sugar.  Shop around the outside edge of the grocery store. This includes fresh fruits and vegetables, bulk grains, fresh meats, and fresh dairy. Cooking  Use low-heat cooking methods, such as baking, instead of high-heat cooking methods like deep frying.  Cook using healthy oils, such as olive, canola, or sunflower oil.  Avoid cooking with butter, cream, or high-fat meats. Meal planning  Eat meals and snacks regularly, preferably at the same times every day.  Avoid going long periods of time without eating.  Eat foods high in fiber, such as fresh fruits, vegetables, beans, and whole grains. Talk to your dietitian about how many servings of carbs you can eat at each meal.  Eat 4-6 ounces (oz) of lean protein each day, such as lean meat, chicken, fish, eggs, or tofu. One oz of lean protein is equal to: ? 1 oz of meat, chicken, or fish. ? 1 egg. ?  cup of tofu.  Eat some foods each day that contain healthy fats, such as avocado, nuts, seeds, and fish. Lifestyle  Check your blood glucose regularly.  Exercise regularly as told by your health care provider. This may include: ? 150 minutes of moderate-intensity or vigorous-intensity exercise each week. This could be brisk walking, biking, or water aerobics. ? Stretching and doing strength exercises, such as yoga or weightlifting, at least  2 times a week.  Take medicines as told by your health care provider.  Do not use any products that contain nicotine or tobacco, such as cigarettes and e-cigarettes. If you need help quitting, ask your health care provider.  Work with a Social worker or diabetes educator to identify strategies to manage stress and any emotional and social challenges. Questions to ask a health care provider  Do I need to meet with a diabetes educator?  Do I need to meet with a dietitian?  What number can I call if I have questions?  When are the best times to check my blood glucose? Where to find more information:  American Diabetes Association: diabetes.org  Academy of Nutrition and Dietetics: www.eatright.[employer] of Diabetes and Digestive and Kidney Diseases (NIH): [employer] Summary  A healthy meal plan will help you control your blood glucose and maintain a healthy lifestyle.  Working with a diet and nutrition specialist (dietitian) can help you make a meal plan that is best for you.  Keep in mind that carbohydrates (carbs) and alcohol have  immediate effects on your blood glucose levels. It is important to count carbs and to use alcohol carefully. This information is not intended to replace advice given to you by your health care provider. Make sure you discuss any questions you have with your health care provider. Document Released: 05/15/2005 Document Revised: 03/18/2017 Document Reviewed: 09/22/2016 Elsevier Interactive Patient Education  2019 Elsevier Inc.   Hypoglycemia Hypoglycemia is when the sugar (glucose) level in your blood is too low. Signs of low blood sugar may include:  Feeling: ? Hungry. ? Worried or nervous (anxious). ? Sweaty and clammy. ? Confused. ? Dizzy. ? Sleepy. ? Sick to your stomach (nauseous).  Having: ? A fast heartbeat. ? A headache. ? A change in your vision. ? Tingling or no feeling (numbness) around your mouth, lips, or tongue. ? Jerky movements that you cannot control (seizure).  Having trouble with: ? Moving (coordination). ? Sleeping. ? Passing out (fainting). ? Getting upset easily (irritability). Low blood sugar can happen to people who have diabetes and people who do not have diabetes. Low blood sugar can happen quickly, and it can be an emergency. Treating low blood sugar Low blood sugar is often treated by eating or drinking something sugary right away, such as:  Fruit juice, 4-6 oz (120-150 mL).  Regular soda (not diet soda), 4-6 oz (120-150 mL).  Low-fat milk, 4 oz (120 mL).  Several pieces of hard candy.  Sugar or honey, 1 Tbsp (15 mL). Treating low blood sugar if you have diabetes If you can think clearly and swallow safely, follow the 15:15 rule:  Take 15 grams of a fast-acting carb (carbohydrate). Talk with your doctor about how much you should take.  Always keep a source of fast-acting carb with you, such as: ? Sugar tablets (glucose pills). Take 3-4 pills. ? 6-8 pieces of hard candy. ? 4-6 oz (120-150 mL) of fruit juice. ? 4-6 oz (120-150 mL) of regular  (not diet) soda. ? 1 Tbsp (15 mL) honey or sugar.  Check your blood sugar 15 minutes after you take the carb.  If your blood sugar is still at or below 70 mg/dL (3.9 mmol/L), take 15 grams of a carb again.  If your blood sugar does not go above 70 mg/dL (3.9 mmol/L) after 3 tries, get help right away.  After your blood sugar goes back to normal, eat a meal or a snack within 1  hour.  Treating very low blood sugar If your blood sugar is at or below 54 mg/dL (3 mmol/L), you have very low blood sugar (severe hypoglycemia). This may also cause:  Passing out.  Jerky movements you cannot control (seizure).  Losing consciousness (coma). This is an emergency. Do not wait to see if the symptoms will go away. Get medical help right away. Call your local emergency services (911 in the [place] Do not drive yourself to the hospital. If you have very low blood sugar and you cannot eat or drink, you may need a glucagon shot (injection). A family member or friend should learn how to check your blood sugar and how to give you a glucagon shot. Ask your doctor if you need to have a glucagon shot kit at home. Follow these instructions at home: General instructions  Take over-the-counter and prescription medicines only as told by your doctor.  Stay aware of your blood sugar as told by your doctor.  Limit alcohol intake to no more than 1 drink a day for nonpregnant women and 2 drinks a day for men. One drink equals 12 oz of beer (355 mL), 5 oz of wine (148 mL), or 1 oz of hard liquor (44 mL).  Keep all follow-up visits as told by your doctor. This is important. If you have diabetes:   Follow your diabetes care plan as told by your doctor. Make sure you: ? Know the signs of low blood sugar. ? Take your medicines as told. ? Follow your exercise and meal plan. ? Eat on time. Do not skip meals. ? Check your blood sugar as often as told by your doctor. Always check it before and after exercise. ? Follow  your sick day plan when you cannot eat or drink normally. Make this plan ahead of time with your doctor.  Share your diabetes care plan with: ? Your work or school. ? People you live with.  Check your pee (urine) for ketones: ? When you are sick. ? As told by your doctor.  Carry a card or wear jewelry that says you have diabetes. Contact a doctor if:  You have trouble keeping your blood sugar in your target range.  You have low blood sugar often. Get help right away if:  You still have symptoms after you eat or drink something sugary.  Your blood sugar is at or below 54 mg/dL (3 mmol/L).  You have jerky movements that you cannot control.  You pass out. These symptoms may be an emergency. Do not wait to see if the symptoms will go away. Get medical help right away. Call your local emergency services (911 in the [place] Do not drive yourself to the hospital. Summary  Hypoglycemia happens when the level of sugar (glucose) in your blood is too low.  Low blood sugar can happen to people who have diabetes and people who do not have diabetes. Low blood sugar can happen quickly, and it can be an emergency.  Make sure you know the signs of low blood sugar and know how to treat it.  Always keep a source of sugar (fast-acting carb) with you to treat low blood sugar. This information is not intended to replace advice given to you by your health care provider. Make sure you discuss any questions you have with your health care provider. Document Released: 11/12/2009 Document Revised: 02/09/2018 Document Reviewed: 09/21/2015 Elsevier Interactive Patient Education  2019 Elsevier Inc.   Hyperglycemia Hyperglycemia is when the sugar (glucose) level  in your blood is too high. It may not cause symptoms. If you do have symptoms, they may include warning signs, such as:  Feeling more thirsty than normal.  Hunger.  Feeling tired.  Needing to pee (urinate) more than normal.  Blurry  eyesight (vision). You may get other symptoms as it gets worse, such as:  Dry mouth.  Not being hungry (loss of appetite).  Fruity-smelling breath.  Weakness.  Weight gain or loss that is not planned. Weight loss may be fast.  A tingling or numb feeling in your hands or feet.  Headache.  Skin that does not bounce back quickly when it is lightly pinched and released (poor skin turgor).  Pain in your belly (abdomen).  Cuts or bruises that heal slowly. High blood sugar can happen to people who do or do not have diabetes. High blood sugar can happen slowly or quickly, and it can be an emergency. Follow these instructions at home: General instructions  Take over-the-counter and prescription medicines only as told by your doctor.  Do not use products that contain nicotine or tobacco, such as cigarettes and e-cigarettes. If you need help quitting, ask your doctor.  Limit alcohol intake to no more than 1 drink per day for nonpregnant women and 2 drinks per day for men. One drink equals 12 oz of beer, 5 oz of wine, or 1 oz of hard liquor.  Manage stress. If you need help with this, ask your doctor.  Keep all follow-up visits as told by your doctor. This is important. Eating and drinking   Stay at a healthy weight.  Exercise regularly, as told by your doctor.  Drink enough fluid, especially when you: ? Exercise. ? Get sick. ? Are in hot temperatures.  Eat healthy foods, such as: ? Low-fat (lean) proteins. ? Complex carbs (complex carbohydrates), such as whole wheat bread or brown rice. ? Fresh fruits and vegetables. ? Low-fat dairy products. ? Healthy fats.  Drink enough fluid to keep your pee (urine) clear or pale yellow. If you have diabetes:   Make sure you know the symptoms of hyperglycemia.  Follow your diabetes management plan, as told by your doctor. Make sure you: ? Take insulin and medicines as told. ? Follow your exercise plan. ? Follow your meal plan.  Eat on time. Do not skip meals. ? Check your blood sugar as often as told. Make sure to check before and after exercise. If you exercise longer or in a different way than you normally do, check your blood sugar more often. ? Follow your sick day plan whenever you cannot eat or drink normally. Make this plan ahead of time with your doctor.  Share your diabetes management plan with people in your workplace, school, and household.  Check your urine for ketones when you are ill and as told by your doctor.  Carry a card or wear jewelry that says that you have diabetes. Contact a doctor if:  Your blood sugar level is higher than 240 mg/dL (13.3 mmol/L) for 2 days in a row.  You have problems keeping your blood sugar in your target range.  High blood sugar happens often for you. Get help right away if:  You have trouble breathing.  You have a change in how you think, feel, or act (mental status).  You feel sick to your stomach (nauseous), and that feeling does not go away.  You cannot stop throwing up (vomiting). These symptoms may be an emergency. Do not wait to  see if the symptoms will go away. Get medical help right away. Call your local emergency services (911 in the [place] Do not drive yourself to the hospital. Summary  Hyperglycemia is when the sugar (glucose) level in your blood is too high.  High blood sugar can happen to people who do or do not have diabetes.  Make sure you drink enough fluids, eat healthy foods, and exercise regularly.  Contact your doctor if you have problems keeping your blood sugar in your target range. This information is not intended to replace advice given to you by your health care provider. Make sure you discuss any questions you have with your health care provider. Document Released: 06/15/2009 Document Revised: 05/05/2016 Document Reviewed: 05/05/2016 Elsevier Interactive Patient Education  2019 [employer] Directive  Advance  directives are legal documents that let you make choices ahead of time about your health care and medical treatment in case you become unable to communicate for yourself. Advance directives are a way for you to communicate your wishes to family, friends, and health care providers. This can help convey your decisions about end-of-life care if you become unable to communicate. Discussing and writing advance directives should happen over time rather than all at once. Advance directives can be changed depending on your situation and what you want, even after you have signed the advance directives. If you do not have an advance directive, some states assign family decision makers to act on your behalf based on how closely you are related to them. Each state has its own laws regarding advance directives. You may want to check with your health care provider, attorney, or state representative about the laws in your state. There are different types of advance directives, such as:  Medical power of attorney.  Living will.  Do not resuscitate (DNR) or do not attempt resuscitation (DNAR) order. Health care proxy and medical power of attorney A health care proxy, also called a health care agent, is a person who is appointed to make medical decisions for you in cases in which you are unable to make the decisions yourself. Generally, people choose someone they know well and trust to represent their preferences. Make sure to ask this person for an agreement to act as your proxy. A proxy may have to exercise judgment in the event of a medical decision for which your wishes are not known. A medical power of attorney is a legal document that names your health care proxy. Depending on the laws in your state, after the document is written, it may also need to be:  Signed.  Notarized.  Dated.  Copied.  Witnessed.  Incorporated into your medical record. You may also want to appoint someone to manage your financial  affairs in a situation in which you are unable to do so. This is called a durable power of attorney for finances. It is a separate legal document from the durable power of attorney for health care. You may choose the same person or someone different from your health care proxy to act as your agent in financial matters. If you do not appoint a proxy, or if there is a concern that the proxy is not acting in your best interests, a court-appointed guardian may be designated to act on your behalf. Living will A living will is a set of instructions documenting your wishes about medical care when you cannot express them yourself. Health care providers should keep a copy of your living  will in your medical record. You may want to give a copy to family members or friends. To alert caregivers in case of an emergency, you can place a card in your wallet to let them know that you have a living will and where they can find it. A living will is used if you become:  Terminally ill.  Incapacitated.  Unable to communicate or make decisions. Items to consider in your living will include:  The use or non-use of life-sustaining equipment, such as dialysis machines and breathing machines (ventilators).  A DNR or DNAR order, which is the instruction not to use cardiopulmonary resuscitation (CPR) if breathing or heartbeat stops.  The use or non-use of tube feeding.  Withholding of food and fluids.  Comfort (palliative) care when the goal becomes comfort rather than a cure.  Organ and tissue donation. A living will does not give instructions for distributing your money and property if you should pass away. It is recommended that you seek the advice of a lawyer when writing a will. Decisions about taxes, beneficiaries, and asset distribution will be legally binding. This process can relieve your family and friends of any concerns surrounding disputes or questions that may come up about the distribution of your  assets. DNR or DNAR A DNR or DNAR order is a request not to have CPR in the event that your heart stops beating or you stop breathing. If a DNR or DNAR order has not been made and shared, a health care provider will try to help any patient whose heart has stopped or who has stopped breathing. If you plan to have surgery, talk with your health care provider about how your DNR or DNAR order will be followed if problems occur. Summary  Advance directives are the legal documents that allow you to make choices ahead of time about your health care and medical treatment in case you become unable to communicate for yourself.  The process of discussing and writing advance directives should happen over time. You can change the advance directives, even after you have signed them.  Advance directives include DNR or DNAR orders, living wills, and designating an agent as your medical power of attorney. This information is not intended to replace advice given to you by your health care provider. Make sure you discuss any questions you have with your health care provider. Document Released: 11/25/2007 Document Revised: 07/07/2016 Document Reviewed: 07/07/2016 Elsevier Interactive Patient Education  2019 [employer].

## 2018-12-08 ENCOUNTER — Telehealth: Payer: Self-pay | Admitting: Family Medicine

## 2018-12-08 DIAGNOSIS — M549 Dorsalgia, unspecified: Secondary | ICD-10-CM

## 2018-12-08 DIAGNOSIS — R2689 Other abnormalities of gait and mobility: Secondary | ICD-10-CM

## 2018-12-08 NOTE — Telephone Encounter (Signed)
Patient has requested a handicapped parking placard I do not see any qualifying reasons in his history Please find out why he is applying for a handicapped parking spot, appointment if something new has happened For neurologic reasons, it states that the person has to be "SEVERELY limited in their ability to walk.Marland KitchenMarland Kitchen"

## 2018-12-08 NOTE — Telephone Encounter (Signed)
I need him to see physical therapy, then He may benefit from a cane or walker or prosthetic device He refused service back in February, but if he feels he is so bad that he needs to park in a handicapped spot, we'll need him to see physical therapy please; this is for his safety to help prevent falls Please re-order if needed (he may not need a new order since one was just put in in February 2020)

## 2018-12-08 NOTE — Telephone Encounter (Signed)
Pt notified referral entered for PT
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
## 2018-12-09 ENCOUNTER — Encounter: Payer: Self-pay | Admitting: Pharmacist

## 2018-12-09 ENCOUNTER — Other Ambulatory Visit: Payer: Self-pay

## 2018-12-09 NOTE — Chronic Care Management (AMB) (Addendum)
Chronic Care Management   Note  12/09/2018 Name: Victor Fox MRN: [id number] DOB: [birth year]  Subjective:   Does the patient  feel that his/her medications are working for him/her?  He is not taking optimal medication regimen  Has the patient been experiencing any side effects to the medications prescribed?  no  Does the patient measure his/her own blood glucose at home?  no   Does the patient measure his/her own blood pressure at home? no   Does the patient have any problems obtaining medications due to transportation or finances?   no  Understanding of regimen: poor Understanding of indications: poor Potential of compliance: poor  Objective: Lab Results  Component Value Date   CREATININE 1.00 10/08/2018   CREATININE 0.71 09/03/2018   CREATININE 0.76 09/02/2018    Lab Results  Component Value Date   HGBA1C 11.7 (H) 10/08/2018    Lipid Panel     Component Value Date/Time   CHOL 162 10/08/2018 0845   CHOL 212 (H) 06/30/2012 0405   TRIG 117 10/08/2018 0845   TRIG 359 (H) 06/30/2012 0405   HDL 39 (L) 10/08/2018 0845   HDL 30 (L) 06/30/2012 0405   CHOLHDL 4.2 10/08/2018 0845   VLDL 26 06/16/2016 1121   VLDL 72 (H) 06/30/2012 0405   LDLCALC 102 (H) 10/08/2018 0845   LDLCALC 110 (H) 06/30/2012 0405    BP Readings from Last 3 Encounters:  11/23/18 138/80  11/05/18 (!) 148/72  11/05/18 (!) 148/72    No Known Allergies  Medications Reviewed Today    Reviewed by Fredderick Severance, NP (Nurse Practitioner) on 11/23/18 at [place] List Status: <None>  Medication Order Taking? Sig Documenting Provider Last Dose Status Informant  amLODipine (NORVASC) 10 MG tablet [id number] No Take 1 tablet (10 mg total) by mouth daily. Appointment needed please for further refills [name], [name], MD Taking Active Self  aspirin 325 MG tablet [id number] No Take 325 mg by mouth daily. [provider] Taking Active Self  atorvastatin (LIPITOR) 40 MG tablet [id number] No Take 1  tablet (40 mg total) by mouth at bedtime.  Patient taking differently:  Take 40 mg by mouth at bedtime. Pt still taking 42m dose   [name], MSatira Anis MD Taking Active   B-D UF III MINI PEN NEEDLES 31G X 5 MM MISC [id number] use once daily  Patient taking differently:  Patient is using twice daily   SRoselee Nova MD Taking Active Self  blood glucose meter kit and supplies KIT [id number] Dispense based on patient and insurance preference. Use up to four times daily as directed. (FOR ICD-9 250.00, 250.01). PFredderick Severance NP  Active   enalapril (VASOTEC) 20 MG tablet [id number] Take 1 tablet (20 mg total) by mouth daily. PFredderick Severance NP Taking Active Self  Insulin Isophane & Regular Human (NOVOLIN 70/30 FLEXPEN) (70-30) 100 UNIT/ML PEN [id number] Inject 30 Units into the skin daily. Pt takes daily at 12 noon prior to going to work [provider] Taking Active   meclizine (ANTIVERT) 25 MG tablet [id number] TAKE 1 TABLET(25 MG) BY MOUTH THREE TIMES DAILY AS NEEDED FOR DIZZINESS [name], MSatira Anis MD Taking Active   metFORMIN (GLUCOPHAGE-XR) 500 MG 24 hr tablet [id number] Take 2 tablets (1,000 mg total) by mouth 2 (two) times daily. LArnetha Courser MD Taking Active   Semaglutide,0.25 or 0.5MG/DOS, (OZEMPIC, 0.25 OR 0.5 MG/DOSE,) 2 MG/1.5ML SOPN [id number] Inject 0.5 mg  into the skin once a week. Fredderick Severance, NP  Active            Assessment:  #Adherence: Patient is non adherent to medication regimen, out of many medications; recommend patient starting pill packs ([facility])  #Anticoagulation: patient was taking ASA 357m since stroke in 2013; recommend ASA 845mfor decreased bleeding risk, statin and BP control to reduce stroke risk; lost to f/u with cardiology, recommend re-establishing care   #DM: dosing own insulin, no prescriber. Doesn't know how to use meter.   #BP: unclear regimen, non adherence   Intervention  YES NO   Explanation  Needs additional therapy      Medication requires monitoring []  []    Preventative therapy   [x]  []  ASA 8163moptimized BP regimen, statin, for stroke reduction  Possible untreated condition   []  []    Synergistic therapy   []  []     Safety/Adverse Med Event      Contraindication present []  []     Unsafe medication [x]  []  ASA 325m68mses increased bleeding risk   Adherence      Cannot afford medication []  []     Cannot self-administer medication appropriately [x]  []  Insulin, glucometer   Does not understand directions [x]  []     Prefers not to take [x]  []     Product unavailable []  []     Forgets to take []  []     Dose/Frequency      Dose form inappropriate for patient []  []     Dosing consideration [x]  []  Dosing insulin on own???????   Other pertinent pharmacist  counseling      Goals Addressed            This Visit's Progress   . COMPLETED: The pharmacy did not have the medicine she told me to take (pt-stated)       Current Barriers:  . KnMarland Kitchenwledge Deficits related to understanding what medications patient should be taking and how much . Lacks caregiver support.  . Non-adherence to prescribed medication regimen . Difficulty obtaining medications  Nurse Case Manager Clinical Goal(s):  . OvMarland Kitchenr the next 7 days, patient will work with CM team pharmacist to correct med adherence, medication availability, and understanding medication dosages and rational for use  Interventions:  . Pharmacy referral for medication adherence, assessability, and knowledge of medications prescribed  Patient Self Care Activities:  . Self administers medications as prescribed . Attends all scheduled provider appointments . Calls pharmacy for medication refills . Calls provider office for new concerns or questions  Initial goal documentation        Time:  Time spent counseling patient: 50 Additional time spent on charting: 15 Review of patient status, including review of consultants  reports, laboratory and other test data, was performed as part of comprehensive evaluation and provision of chronic care management services.   Plan: Recommendations discussed with provider - [facility] [place] better adherence (by requesting pill packs) and for cost savings (preferred for [employer])  Recommendations discussed with patient - pill [facility]  Follow up: Telephone follow up appointment with CCM team member scheduled for: 2 weeks  JuliRuben ReasonarmD Clinical Pharmacist [facility]-[phone]

## 2018-12-17 ENCOUNTER — Telehealth: Payer: Self-pay | Admitting: Family Medicine

## 2018-12-17 NOTE — Patient Instructions (Signed)
Goals Addressed            This Visit's Progress   . I need to get my meds straightened out (pt-stated)       Current Barriers:  [name] Knowledge Deficits related to proper medication use . Non Adherence to prescribed medication regimen  Pharmacist Clinical Goal(s):  [name] Over the next 30 days, patient will demonstrate improved understanding of prescribed medications and rationale for usage as evidenced by patient report  Interventions: . Comprehensive medication review performed. . [facility]  . Collaboration with provider re: medication management  Patient Self Care Activities:  . Self administers medications as prescribed . Calls pharmacy for medication refills  Initial goal documentation     . COMPLETED: The pharmacy did not have the medicine she told me to take (pt-stated)       Current Barriers:  [name] Knowledge Deficits related to understanding what medications patient should be taking and how much . Lacks caregiver support.  . Non-adherence to prescribed medication regimen . Difficulty obtaining medications  Nurse Case Manager Clinical Goal(s):  [name] Over the next 7 days, patient will work with CM team pharmacist to correct med adherence, medication availability, and understanding medication dosages and rational for use  Interventions:  . Pharmacy referral for medication adherence, assessability, and knowledge of medications prescribed  Patient Self Care Activities:  . Self administers medications as prescribed . Attends all scheduled provider appointments . Calls pharmacy for medication refills . Calls provider office for new concerns or questions  Initial goal documentation        The patient verbalized understanding of instructions provided today and declined a print copy of patient instruction materials.

## 2018-12-21 ENCOUNTER — Telehealth: Payer: Self-pay | Admitting: Family Medicine

## 2018-12-21 ENCOUNTER — Other Ambulatory Visit: Payer: Self-pay

## 2018-12-21 ENCOUNTER — Ambulatory Visit: Payer: Self-pay | Admitting: Pharmacist

## 2018-12-21 DIAGNOSIS — IMO0002 Reserved for concepts with insufficient information to code with codable children: Secondary | ICD-10-CM

## 2018-12-21 DIAGNOSIS — E1165 Type 2 diabetes mellitus with hyperglycemia: Secondary | ICD-10-CM

## 2018-12-21 DIAGNOSIS — E785 Hyperlipidemia, unspecified: Secondary | ICD-10-CM

## 2018-12-21 DIAGNOSIS — E669 Obesity, unspecified: Secondary | ICD-10-CM

## 2018-12-21 DIAGNOSIS — Z794 Long term (current) use of insulin: Principal | ICD-10-CM

## 2018-12-21 DIAGNOSIS — I10 Essential (primary) hypertension: Secondary | ICD-10-CM

## 2018-12-21 DIAGNOSIS — E118 Type 2 diabetes mellitus with unspecified complications: Principal | ICD-10-CM

## 2018-12-21 NOTE — Telephone Encounter (Signed)
Going through open notes; this is empty Signing off

## 2018-12-21 NOTE — Telephone Encounter (Signed)
Please clarify [facility]  Please review medicines with him, as last visit showed he was not taking two BP meds (ACE-I and CCB) and another was a different dose (statin)  I don't want to send in two BP meds for him all at once if he hasn't been taking them if last BP was 138/80; he may benefit from just getting back on ACE-I at this time, recheck BP and then add CCB if needed if you agree  I have not used their pill pack system; is that a 28 day supply or 30 day supply or 90 day supply?

## 2018-12-23 NOTE — Telephone Encounter (Signed)
I don't feel comfortable with him taking KCl 68mEq with the ACE-I I don't know who is prescribing the KCl Would you mind calling the pharmacy about that one in particular? Also, I need to know for the pill packs if you want a 28 day, 30 day, or 90 day supply please

## 2018-12-23 NOTE — Chronic Care Management (AMB) (Signed)
  Chronic Care Management   Follow Up Note   12/23/2018 Name: Victor Fox MRN: [id number] DOB: [birth year]  Referred by: [name], MD Reason for referral : Chronic Care Management (Pharmacy outreach)   Victor Fox is a 73 y.o. year old male who is a primary care patient of [name], [name], MD. The CCM team was consulted for assistance with chronic disease management and care coordination needs.    Review of patient status, including review of consultants reports, relevant laboratory and other test results, and collaboration with appropriate care team members and the patient's provider was performed as part of comprehensive patient evaluation and provision of chronic care management services.    Assessment: #Medication management: Patient is not taking amlodipine 10mg  or atorvastatin 40mg . Taking reduced dose of metformin 500mg  BID (instead of 1000mg  BID).   #Medication adherence: Will collaborate with patient and provider to [facility].   #HTN: enalapril 20mg  (missing: amlodipine 10mg )  #DM: metformin 500mg  BID, insulin NPH 70/30 30 units SQ daily, Ozempic 0.25mg    #CAD, hx of stroke: ASA 81mg  (missing: atorvastatin 40mg )  Unknown indication: KCL 41mEq  Goals Addressed            This Visit's Progress   . I need to get my meds straightened out (pt-stated)       Current Barriers:  [name] Knowledge Deficits related to proper medication use . Non Adherence to prescribed medication regimen  Pharmacist Clinical Goal(s):  [name] Over the next 30 days, patient will demonstrate improved understanding of prescribed medications and rationale for usage as evidenced by patient report  Interventions: . Comprehensive medication review performed. . [facility]  . Collaboration with provider re: medication management  Patient Self Care Activities:  . Self administers medications as prescribed . Calls pharmacy for  medication refills  Please see past updates related to this goal by clicking on the "Past Updates" button in the selected goal         Plan: Follow up with provider re: medication management and adherence   Follow up:  Telephone outreach to patient in 7 days to provide medication guide and education surrounding cholesterol and atrovastatin  Ruben Reason, PharmD Clinical Pharmacist [facility] [phone]

## 2018-12-23 NOTE — Telephone Encounter (Signed)
I will send her a message.

## 2018-12-23 NOTE — Telephone Encounter (Signed)
Hi Dr. Sanda Klein,   Mr. Nole is not taking amlodipine 10mg  or atorvastatin 40mg . He is taking a decreased dose of both metformin (500mg  BID and Ozempic (0.25mg  weekly).   He also has potassium 62mEq that he is taking daily (unsure of when prescribed).   I spoke with him specifically about restarting his statin. He does not have a home BP cuff to monitor blood pressure.   Thank you!   Ruben Reason, PharmD Clinical Pharmacist [facility] [phone]

## 2018-12-23 NOTE — Telephone Encounter (Signed)
Can you call Almyra Free and see if she can answer these questions? She may not be able to write in notes in the chart and I need these answers to know exactly what medicines to send and how many pills at a time Thank you

## 2018-12-24 ENCOUNTER — Ambulatory Visit: Payer: Self-pay | Admitting: Pharmacist

## 2018-12-24 DIAGNOSIS — E1165 Type 2 diabetes mellitus with hyperglycemia: Secondary | ICD-10-CM

## 2018-12-24 DIAGNOSIS — E669 Obesity, unspecified: Secondary | ICD-10-CM

## 2018-12-24 DIAGNOSIS — E118 Type 2 diabetes mellitus with unspecified complications: Principal | ICD-10-CM

## 2018-12-24 DIAGNOSIS — IMO0002 Reserved for concepts with insufficient information to code with codable children: Secondary | ICD-10-CM

## 2018-12-24 DIAGNOSIS — E785 Hyperlipidemia, unspecified: Secondary | ICD-10-CM

## 2018-12-24 DIAGNOSIS — I10 Essential (primary) hypertension: Secondary | ICD-10-CM

## 2018-12-24 DIAGNOSIS — Z794 Long term (current) use of insulin: Principal | ICD-10-CM

## 2018-12-24 MED ORDER — METFORMIN HCL ER 500 MG PO TB24: 500.0000 mg | ORAL_TABLET | Freq: Two times a day (BID) | ORAL | 2 refills | Status: DC

## 2018-12-24 MED ORDER — SEMAGLUTIDE(0.25 OR 0.5MG/DOS) 2 MG/1.5ML ~~LOC~~ SOPN: 0.5000 mg | PEN_INJECTOR | SUBCUTANEOUS | 2 refills | Status: DC

## 2018-12-24 MED ORDER — ATORVASTATIN CALCIUM 40 MG PO TABS: 40.0000 mg | ORAL_TABLET | Freq: Every day | ORAL | 2 refills | Status: DC

## 2018-12-24 MED ORDER — ENALAPRIL MALEATE 20 MG PO TABS: 20.0000 mg | ORAL_TABLET | Freq: Every day | ORAL | 2 refills | Status: DC

## 2018-12-24 NOTE — Telephone Encounter (Signed)
[facility] sent [facility]

## 2018-12-24 NOTE — Telephone Encounter (Signed)
Hi Dr. Sanda Klein-   It seems Dr. Rockey Situ prescribed the potassium back in 2017 when he also prescribed furosemide 3x weekly for leg swelling. The furosemide was DC'ed in late 2018, but for some reason he still has potassium. I have contacted patient and advised him to stop taking the potassium and to dispose of it safely.   I believe a 30 days supply of medications would be best based on his lack of adherence in the past- I don't want him to have adverse reactions from taking his medications consistently all of a sudden (particularly for blood pressure). However, his lack of adherence can also be an argument for 90 days supply. I will defer to your clinical judgement. (No benefit for 28 days supply)  Victor Fox Reason

## 2018-12-24 NOTE — Addendum Note (Signed)
Addended by: [name], [name] on: 12/24/2018 04:46 PM   Modules accepted: Orders

## 2018-12-24 NOTE — Chronic Care Management (AMB) (Signed)
  Chronic Care Management   Follow Up Note   12/24/2018 Name: Victor Fox MRN: [id number] DOB: 06/25/1946  Referred by: [name], MD Reason for referral : Chronic Care Management (Care coordination)   [name] is a 73 y.o. year old male who is a primary care patient of [name], [name], MD. The CCM team was consulted for assistance with chronic disease management and care coordination needs.    Review of patient status, including review of consultants reports, relevant laboratory and other test results, and collaboration with appropriate care team members and the patient's provider was performed as part of comprehensive patient evaluation and provision of chronic care management services.    Goals Addressed            This Visit's Progress   . I need to get my meds straightened out (pt-stated)       Current Barriers:  [name] Knowledge Deficits related to proper medication use . Non Adherence to prescribed medication regimen  Pharmacist Clinical Goal(s):  [name] Over the next 30 days, patient will demonstrate improved understanding of prescribed medications and rationale for usage as evidenced by patient report  Interventions: . Comprehensive medication review performed. . [facility]  . Collaboration with provider re: medication management  Advised patient to STOP taking potassium chloride 20 mEq. This was prescribed several years ago concurrently with furosemide, and he should no longer take it. Counseled patient on appropriate disposal of medication (mixing with coffee grounds or food scraps; turning in to local police department drop off)  Patient Self Care Activities:  . Self administers medications as prescribed . Calls pharmacy for medication refills  .   Please see past updates related to this goal by clicking on the "Past Updates" button in the selected goal          Telephone follow up appointment with CCM team member scheduled  for: 10 business days with PharmD   Ruben Reason, PharmD Clinical Pharmacist [facility] [phone]

## 2018-12-30 ENCOUNTER — Encounter: Payer: Self-pay | Admitting: Pharmacist

## 2019-01-07 ENCOUNTER — Other Ambulatory Visit: Payer: Self-pay

## 2019-01-10 ENCOUNTER — Inpatient Hospital Stay: Payer: Medicare Other | Attending: Oncology | Admitting: Oncology

## 2019-01-10 ENCOUNTER — Other Ambulatory Visit: Payer: Self-pay

## 2019-01-17 ENCOUNTER — Encounter: Payer: Self-pay | Admitting: *Deleted

## 2019-01-17 NOTE — Progress Notes (Signed)
  Oncology Nurse Navigator Documentation  Navigator Location: [facility] Onc (01/17/19 1500)   )Navigator Encounter Type: Telephone (01/17/19 1500) Telephone: Lahoma Crocker Call;Appt Confirmation/Clarification (01/17/19 1500)                       Barriers/Navigation Needs: Coordination of Care (01/17/19 1500)   Interventions: Coordination of Care (01/17/19 1500)   Coordination of Care: Appts;Radiology (01/17/19 1500)       Spoke with patient to review upcoming appts for CT scan on 6/3 and virtual visit with Dr. Tasia Catchings on 6/5. Confirmed with pt that he does have capability for virtual visit. All questions answered during visit. Pt instructed to call back with any further questions or needs. Pt verbalized understanding. Appts have been mailed as well per pt request.            Time Spent with Patient: 30 (01/17/19 1500)

## 2019-01-25 ENCOUNTER — Telehealth: Payer: Self-pay | Admitting: Family Medicine

## 2019-01-25 NOTE — Telephone Encounter (Signed)
Pt came into office and said that pharm hs been sending requesting for his medication below. Patient is out of all the below. 1. oxempic  2. Also needs test strips for his meter. Thinks it is a One Touch Ultra 2

## 2019-01-25 NOTE — Telephone Encounter (Signed)
Looks like already done, called in

## 2019-02-02 ENCOUNTER — Ambulatory Visit: Payer: Medicare Other

## 2019-02-02 ENCOUNTER — Other Ambulatory Visit: Payer: Self-pay

## 2019-02-03 ENCOUNTER — Inpatient Hospital Stay: Payer: Medicare Other | Attending: Oncology | Admitting: Oncology

## 2019-02-04 ENCOUNTER — Ambulatory Visit: Payer: Medicare Other | Admitting: Oncology

## 2019-02-10 ENCOUNTER — Ambulatory Visit: Admission: RE | Admit: 2019-02-10 | Payer: Medicare Other | Source: Ambulatory Visit

## 2019-02-11 ENCOUNTER — Inpatient Hospital Stay: Payer: Medicare Other | Attending: Oncology | Admitting: Oncology

## 2019-02-21 ENCOUNTER — Other Ambulatory Visit: Payer: Self-pay

## 2019-02-21 ENCOUNTER — Ambulatory Visit (INDEPENDENT_AMBULATORY_CARE_PROVIDER_SITE_OTHER): Payer: Medicare Other | Admitting: Podiatry

## 2019-02-21 ENCOUNTER — Encounter: Payer: Self-pay | Admitting: Podiatry

## 2019-02-21 DIAGNOSIS — M79675 Pain in left toe(s): Secondary | ICD-10-CM

## 2019-02-21 DIAGNOSIS — B351 Tinea unguium: Secondary | ICD-10-CM

## 2019-02-21 DIAGNOSIS — M79674 Pain in right toe(s): Secondary | ICD-10-CM

## 2019-02-21 NOTE — Progress Notes (Signed)
Complaint:  Visit Type: Patient returns to my office for continued preventative foot care services. Complaint: Patient states" my nails have grown long and thick and become painful to walk and wear shoes" Patient has been diagnosed with DM with no foot complications. The patient presents for preventative foot care services. No changes to ROS  Podiatric Exam: Vascular: dorsalis pedis and posterior tibial pulses are palpable bilateral. Capillary return is immediate. Temperature gradient is WNL. Skin turgor WNL  Sensorium: Diminished  Semmes Weinstein monofilament test. Normal tactile sensation bilaterally. Nail Exam: Pt has thick disfigured discolored nails with subungual debris noted bilateral entire nail hallux through fifth toenails Ulcer Exam: There is no evidence of ulcer or pre-ulcerative changes or infection. Orthopedic Exam: Muscle tone and strength are WNL. No limitations in general ROM. No crepitus or effusions noted. Foot type and digits show no abnormalities. Bony prominences are unremarkable. Skin: No Porokeratosis. No infection or ulcers  Diagnosis:  Onychomycosis, , Pain in right toe, pain in left toes  Treatment & Plan Procedures and Treatment: Consent by patient was obtained for treatment procedures.   Debridement of mycotic and hypertrophic toenails, 1 through 5 bilateral and clearing of subungual debris. No ulceration, no infection noted.  Return Visit-Office Procedure: Patient instructed to return to the office for a follow up visit 4 months for continued evaluation and treatment.    Gardiner Barefoot DPM

## 2019-02-23 ENCOUNTER — Ambulatory Visit: Payer: Medicare Other | Attending: Family Medicine | Admitting: Physical Therapy

## 2019-02-28 ENCOUNTER — Ambulatory Visit: Payer: Medicare Other | Admitting: Physical Therapy

## 2019-03-01 NOTE — Progress Notes (Signed)
This encounter was created in error - please disregard.

## 2019-03-02 ENCOUNTER — Ambulatory Visit: Payer: Medicare Other | Admitting: Physical Therapy

## 2019-03-07 ENCOUNTER — Ambulatory Visit: Payer: Medicare Other | Admitting: Physical Therapy

## 2019-03-09 ENCOUNTER — Ambulatory Visit: Payer: Medicare Other | Admitting: Physical Therapy

## 2019-03-14 ENCOUNTER — Ambulatory Visit: Payer: Medicare Other | Admitting: Physical Therapy

## 2019-03-16 ENCOUNTER — Ambulatory Visit: Payer: Medicare Other | Admitting: Physical Therapy

## 2019-03-21 ENCOUNTER — Ambulatory Visit: Payer: Medicare Other | Admitting: Physical Therapy

## 2019-03-23 ENCOUNTER — Ambulatory Visit: Payer: Medicare Other | Admitting: Physical Therapy

## 2019-03-28 ENCOUNTER — Ambulatory Visit: Payer: Medicare Other | Admitting: Physical Therapy

## 2019-03-30 ENCOUNTER — Ambulatory Visit: Payer: Medicare Other | Admitting: Physical Therapy

## 2019-04-04 ENCOUNTER — Ambulatory Visit: Payer: Medicare Other | Admitting: Physical Therapy

## 2019-04-06 ENCOUNTER — Ambulatory Visit: Payer: Medicare Other | Admitting: Physical Therapy

## 2019-04-11 ENCOUNTER — Ambulatory Visit: Payer: Medicare Other | Admitting: Physical Therapy

## 2019-04-13 ENCOUNTER — Ambulatory Visit: Payer: Medicare Other | Admitting: Physical Therapy

## 2019-04-18 ENCOUNTER — Ambulatory Visit: Payer: Medicare Other | Admitting: Physical Therapy

## 2019-04-20 ENCOUNTER — Ambulatory Visit: Payer: Medicare Other | Admitting: Physical Therapy

## 2019-04-25 ENCOUNTER — Ambulatory Visit: Payer: Medicare Other | Admitting: Nurse Practitioner

## 2019-05-23 ENCOUNTER — Ambulatory Visit: Payer: Medicare Other | Admitting: Podiatry

## 2019-10-18 ENCOUNTER — Emergency Department: Payer: Medicare Other

## 2019-10-18 ENCOUNTER — Other Ambulatory Visit: Payer: Self-pay

## 2019-10-18 ENCOUNTER — Inpatient Hospital Stay
Admission: EM | Admit: 2019-10-18 | Discharge: 2019-10-20 | DRG: 305 | Disposition: A | Discharge status: Home / Self Care | Payer: Medicare Other | Attending: Family Medicine | Admitting: Family Medicine

## 2019-10-18 DIAGNOSIS — I10 Essential (primary) hypertension: Secondary | ICD-10-CM | POA: Diagnosis present

## 2019-10-18 DIAGNOSIS — Z20822 Contact with and (suspected) exposure to covid-19: Secondary | ICD-10-CM | POA: Diagnosis present

## 2019-10-18 DIAGNOSIS — Z8673 Personal history of transient ischemic attack (TIA), and cerebral infarction without residual deficits: Secondary | ICD-10-CM

## 2019-10-18 DIAGNOSIS — I160 Hypertensive urgency: Secondary | ICD-10-CM | POA: Diagnosis present

## 2019-10-18 DIAGNOSIS — R479 Unspecified speech disturbances: Secondary | ICD-10-CM

## 2019-10-18 DIAGNOSIS — E785 Hyperlipidemia, unspecified: Secondary | ICD-10-CM | POA: Diagnosis present

## 2019-10-18 DIAGNOSIS — R410 Disorientation, unspecified: Secondary | ICD-10-CM | POA: Diagnosis not present

## 2019-10-18 DIAGNOSIS — I161 Hypertensive emergency: Secondary | ICD-10-CM | POA: Diagnosis not present

## 2019-10-18 DIAGNOSIS — G9341 Metabolic encephalopathy: Secondary | ICD-10-CM | POA: Diagnosis present

## 2019-10-18 DIAGNOSIS — Z8249 Family history of ischemic heart disease and other diseases of the circulatory system: Secondary | ICD-10-CM

## 2019-10-18 DIAGNOSIS — G459 Transient cerebral ischemic attack, unspecified: Secondary | ICD-10-CM | POA: Diagnosis not present

## 2019-10-18 DIAGNOSIS — R4182 Altered mental status, unspecified: Secondary | ICD-10-CM | POA: Diagnosis present

## 2019-10-18 DIAGNOSIS — M47812 Spondylosis without myelopathy or radiculopathy, cervical region: Secondary | ICD-10-CM | POA: Diagnosis present

## 2019-10-18 DIAGNOSIS — R9401 Abnormal electroencephalogram [EEG]: Secondary | ICD-10-CM | POA: Diagnosis present

## 2019-10-18 DIAGNOSIS — E1165 Type 2 diabetes mellitus with hyperglycemia: Secondary | ICD-10-CM | POA: Diagnosis present

## 2019-10-18 DIAGNOSIS — Z79899 Other long term (current) drug therapy: Secondary | ICD-10-CM

## 2019-10-18 DIAGNOSIS — I674 Hypertensive encephalopathy: Secondary | ICD-10-CM | POA: Diagnosis present

## 2019-10-18 DIAGNOSIS — E559 Vitamin D deficiency, unspecified: Secondary | ICD-10-CM | POA: Diagnosis present

## 2019-10-18 DIAGNOSIS — Z7982 Long term (current) use of aspirin: Secondary | ICD-10-CM

## 2019-10-18 DIAGNOSIS — I2510 Atherosclerotic heart disease of native coronary artery without angina pectoris: Secondary | ICD-10-CM | POA: Diagnosis present

## 2019-10-18 DIAGNOSIS — E1151 Type 2 diabetes mellitus with diabetic peripheral angiopathy without gangrene: Secondary | ICD-10-CM | POA: Diagnosis present

## 2019-10-18 DIAGNOSIS — Z981 Arthrodesis status: Secondary | ICD-10-CM

## 2019-10-18 DIAGNOSIS — G9340 Encephalopathy, unspecified: Secondary | ICD-10-CM | POA: Diagnosis present

## 2019-10-18 DIAGNOSIS — Z794 Long term (current) use of insulin: Secondary | ICD-10-CM

## 2019-10-18 DIAGNOSIS — R2689 Other abnormalities of gait and mobility: Secondary | ICD-10-CM | POA: Diagnosis present

## 2019-10-18 LAB — URINALYSIS, ROUTINE W REFLEX MICROSCOPIC
Bacteria, UA: NONE SEEN
Bilirubin Urine: NEGATIVE
Glucose, UA: >=500 mg/dL — AB
Hgb urine dipstick: NEGATIVE
Ketones, ur: NEGATIVE mg/dL
Leukocytes,Ua: NEGATIVE
Nitrite: NEGATIVE
Protein, ur: 30 mg/dL — AB
Specific Gravity, Urine: 1.022 (ref 1.005–1.030)
Squamous Epithelial / HPF: NONE SEEN (ref 0–5)
pH: 6 (ref 5.0–8.0)

## 2019-10-18 LAB — COMPREHENSIVE METABOLIC PANEL
ALT: 15 U/L (ref 0–44)
AST: 17 U/L (ref 15–41)
Albumin: 3.7 g/dL (ref 3.5–5.0)
Alkaline Phosphatase: 74 U/L (ref 38–126)
Anion gap: 9 (ref 5–15)
BUN: 12 mg/dL (ref 8–23)
CO2: 28 mmol/L (ref 22–32)
Calcium: 9.4 mg/dL (ref 8.9–10.3)
Chloride: 101 mmol/L (ref 98–111)
Creatinine, Ser: 0.97 mg/dL (ref 0.61–1.24)
GFR calc Af Amer: >60 mL/min (ref >60)
GFR calc non Af Amer: >60 mL/min (ref >60)
Glucose, Bld: 240 mg/dL — ABNORMAL HIGH (ref 70–99)
Potassium: 4.1 mmol/L (ref 3.5–5.1)
Sodium: 138 mmol/L (ref 135–145)
Total Bilirubin: 0.7 mg/dL (ref 0.3–1.2)
Total Protein: 6.7 g/dL (ref 6.5–8.1)

## 2019-10-18 LAB — URINE DRUG SCREEN, QUALITATIVE (ARMC ONLY)
Amphetamines, Ur Screen: NOT DETECTED
Barbiturates, Ur Screen: NOT DETECTED
Benzodiazepine, Ur Scrn: NOT DETECTED
Cannabinoid 50 Ng, Ur ~~LOC~~: NOT DETECTED
Cocaine Metabolite,Ur ~~LOC~~: NOT DETECTED
MDMA (Ecstasy)Ur Screen: NOT DETECTED
Methadone Scn, Ur: NOT DETECTED
Opiate, Ur Screen: NOT DETECTED
Phencyclidine (PCP) Ur S: NOT DETECTED
Tricyclic, Ur Screen: NOT DETECTED

## 2019-10-18 LAB — CBC WITH DIFFERENTIAL/PLATELET
Abs Immature Granulocytes: 0.05 10*3/uL (ref 0.00–0.07)
Basophils Absolute: 0 10*3/uL (ref 0.0–0.1)
Basophils Relative: 1 %
Eosinophils Absolute: 0.1 10*3/uL (ref 0.0–0.5)
Eosinophils Relative: 2 %
HCT: 45 % (ref 39.0–52.0)
Hemoglobin: 15.4 g/dL (ref 13.0–17.0)
Immature Granulocytes: 1 %
Lymphocytes Relative: 37 %
Lymphs Abs: 3.2 10*3/uL (ref 0.7–4.0)
MCH: 29.1 pg (ref 26.0–34.0)
MCHC: 34.2 g/dL (ref 30.0–36.0)
MCV: 84.9 fL (ref 80.0–100.0)
Monocytes Absolute: 0.6 10*3/uL (ref 0.1–1.0)
Monocytes Relative: 7 %
Neutro Abs: 4.7 10*3/uL (ref 1.7–7.7)
Neutrophils Relative %: 52 %
Platelets: 195 10*3/uL (ref 150–400)
RBC: 5.3 MIL/uL (ref 4.22–5.81)
RDW: 12.9 % (ref 11.5–15.5)
WBC: 8.8 10*3/uL (ref 4.0–10.5)
nRBC: 0 % (ref 0.0–0.2)

## 2019-10-18 LAB — LACTIC ACID, PLASMA
Lactic Acid, Venous: 1.5 mmol/L (ref 0.5–1.9)
Lactic Acid, Venous: 1.8 mmol/L (ref 0.5–1.9)

## 2019-10-18 LAB — TROPONIN I (HIGH SENSITIVITY)
Troponin I (High Sensitivity): 16 ng/L (ref <18)
Troponin I (High Sensitivity): 17 ng/L (ref <18)

## 2019-10-18 LAB — RESPIRATORY PANEL BY RT PCR (FLU A&B, COVID)
Influenza A by PCR: NEGATIVE
Influenza B by PCR: NEGATIVE
SARS Coronavirus 2 by RT PCR: NEGATIVE

## 2019-10-18 LAB — ETHANOL: Alcohol, Ethyl (B): <10 mg/dL (ref <10)

## 2019-10-18 IMAGING — MR MR HEAD WO/W CM
19 series · 48 of 48 positions shown · IV contrast (gadavist)
Comparison: Head and cervical spine CT earlier today.

CLINICAL DATA: 73-year-old male with altered mental status,
confusion, ataxia.

EXAM:
MRI HEAD WITHOUT AND WITH CONTRAST
TECHNIQUE: Multiplanar, multiecho pulse sequences of the brain and surrounding
structures were obtained without and with intravenous contrast.
CONTRAST:  9mL GADAVIST GADOBUTROL 1 MMOL/ML IV SOLN

[Series 5: ax dwi_tracew · axial · 3.0mm · 0.60mm/px · z∈[-38,+111]mm · 2 of 48 slices shown (1 of 2)]
[im 1/48]
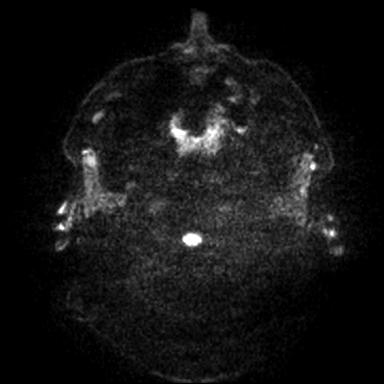
[im 48/48]
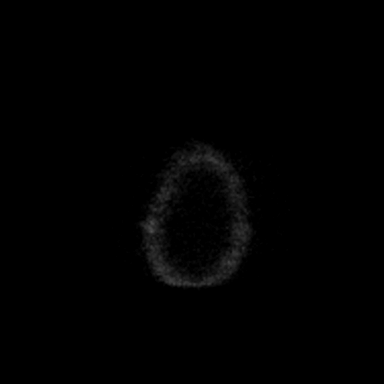

[Series 6: ax dwi_adc · axial · 3.0mm · 0.60mm/px · z∈[-38,+111]mm · 3 of 48 slices shown (1 of 2)]
[im 1/48]
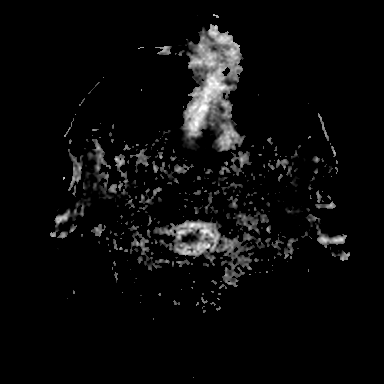
[im 24/48]
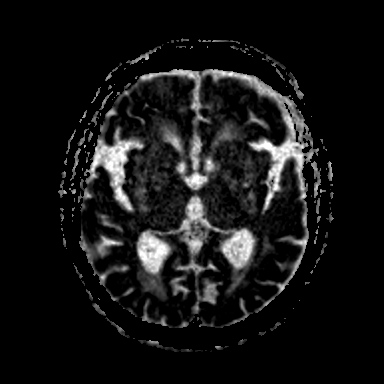
[im 48/48]
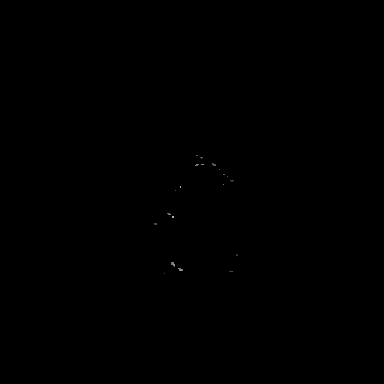

[Series 7: cor dwi_tracew · coronal · 5.0mm · 0.60mm/px · 3 of 40 slices shown]
[im 1/40]
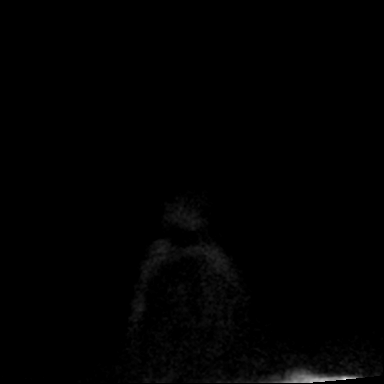
[im 20/40]
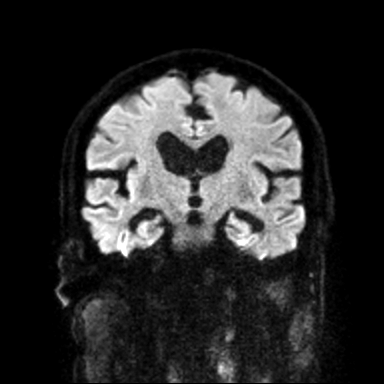
[im 40/40]
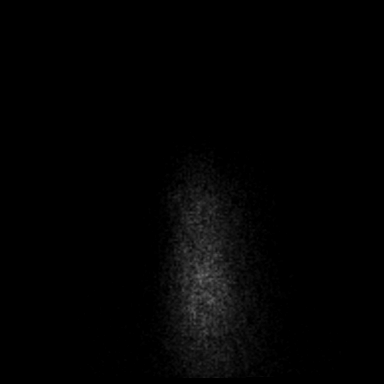

[Series 8: cor dwi_adc · coronal · 5.0mm · 0.60mm/px · 2 of 36 slices shown]
[im 1/36]
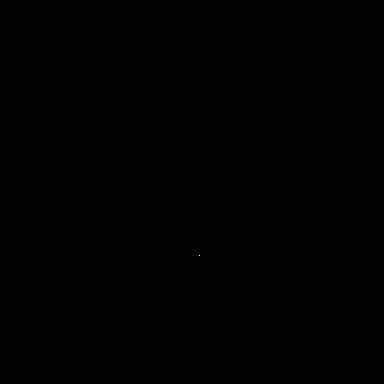
[im 36/36]
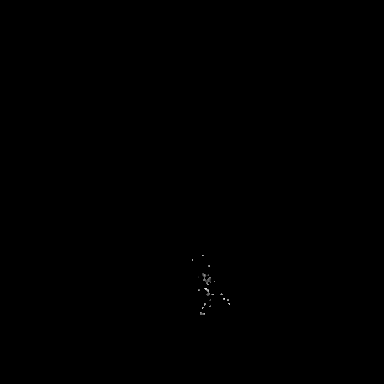

[Series 9: ax dwi_tracew · axial · 3.0mm · 1.31mm/px · z∈[-38,+111]mm · 3 of 48 slices shown (2 of 2)]
[im 1/48]
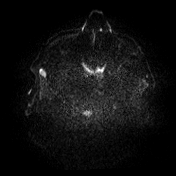
[im 24/48]
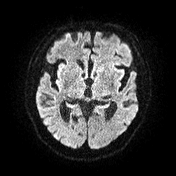
[im 48/48]
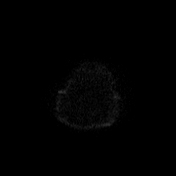

[Series 10: ax dwi_adc · axial · 3.0mm · 1.31mm/px · z∈[-38,+111]mm · 3 of 48 slices shown (2 of 2)]
[im 1/48]
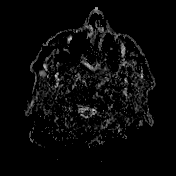
[im 24/48]
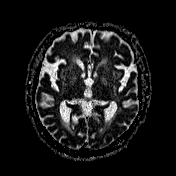
[im 48/48]
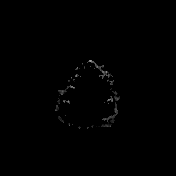

[Series 11: T1 · sagittal · 5.0mm · 0.62mm/px · 2 of 25 slices shown (1 of 3)]
[im 1/25]
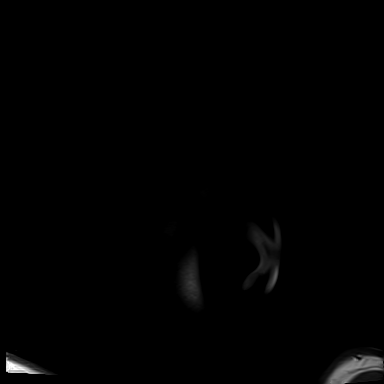
[im 25/25]
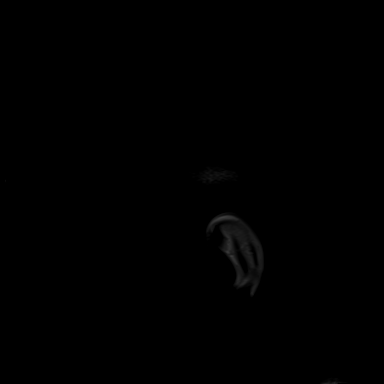

[Series 12: T2 · axial · 5.0mm · 0.53mm/px · z∈[-40,+109]mm · 2 of 27 slices shown]
[im 1/27]
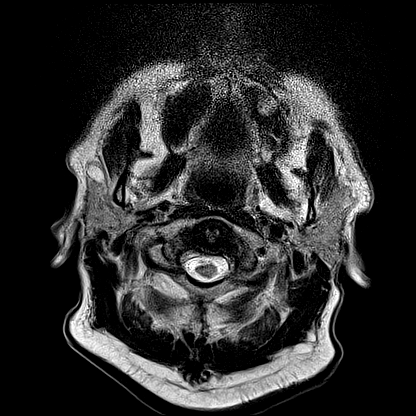
[im 27/27]
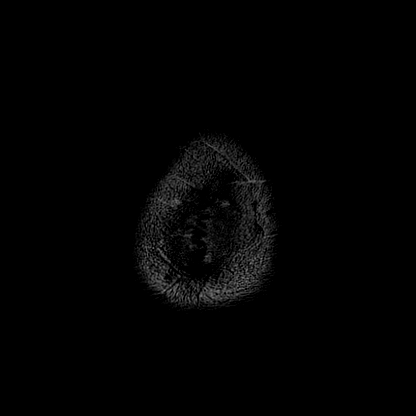

[Series 13: mag_images · axial · 3.0mm · 0.90mm/px · z∈[-50,+120]mm · 4 of 60 slices shown]
[im 1/60]
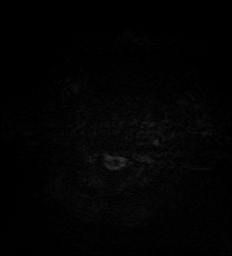
[im 20/60]
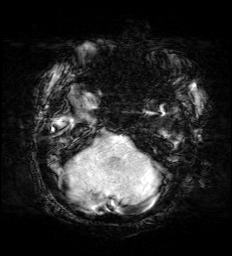
[im 40/60]
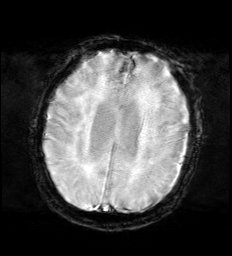
[im 60/60]
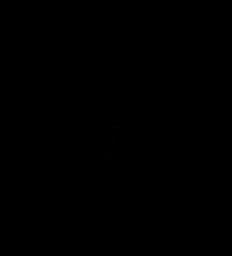

[Series 14: pha_images · axial · 3.0mm · 0.90mm/px · z∈[-47,+112]mm · 4 of 55 slices shown]
[im 1/55]
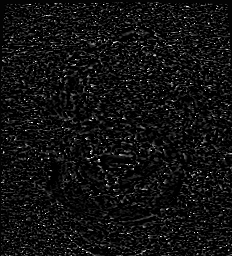
[im 19/55]
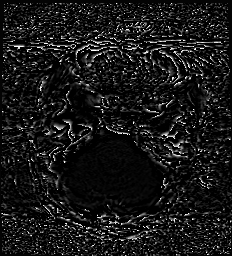
[im 37/55]
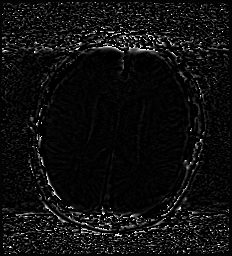
[im 55/55]
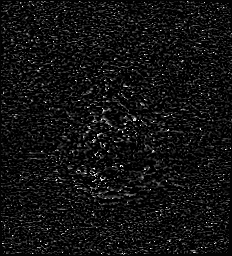

[Series 15: swi_images · axial · 3.0mm · 0.90mm/px · z∈[-50,+120]mm · 4 of 60 slices shown]
[im 1/60]
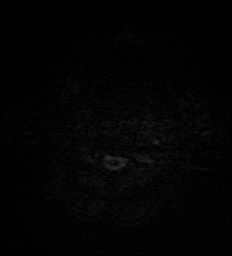
[im 20/60]
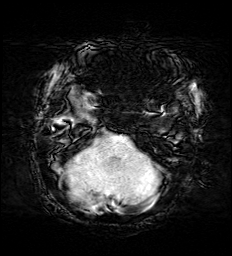
[im 40/60]
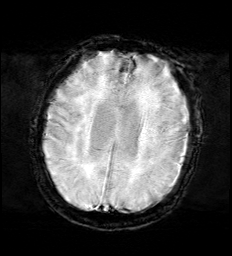
[im 60/60]
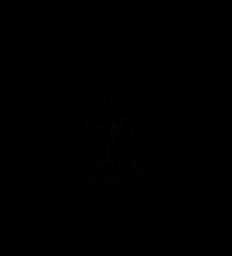

[Series 17: FLAIR · axial · 5.0mm · 1.20mm/px · z∈[-78,+83]mm · 2 of 28 slices shown]
[im 1/28]
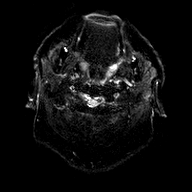
[im 28/28]
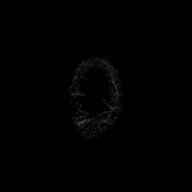

[Series 18: T1 · axial · 5.0mm · 0.90mm/px · z∈[-85,+82]mm · 2 of 29 slices shown (2 of 3)]
[im 1/29]
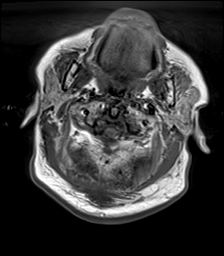
[im 29/29]
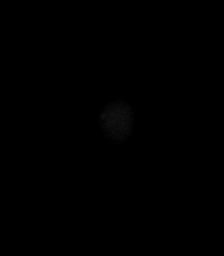

[Series 19: T1 · sagittal · 5.0mm · 0.94mm/px · 2 of 23 slices shown (3 of 3)]
[im 1/23]
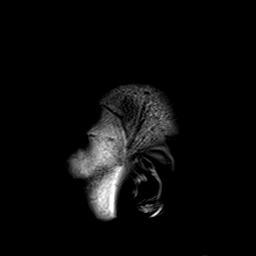
[im 23/23]
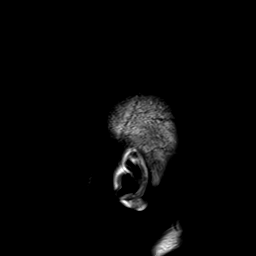

[Series 20: T2 post-contrast · coronal · 5.0mm · 0.57mm/px · 2 of 29 slices shown]
[im 1/29]
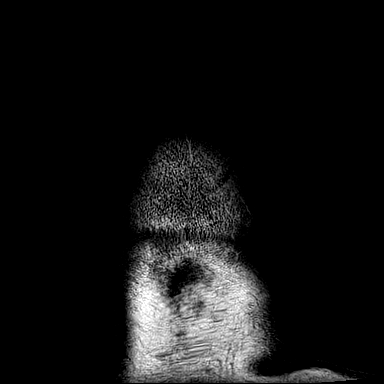
[im 29/29]
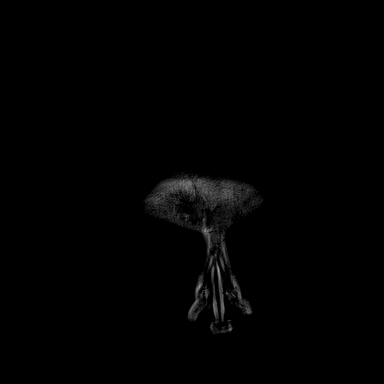

[Series 21: T1 post-contrast · axial · 5.0mm · 0.90mm/px · z∈[-82,+79]mm · 2 of 28 slices shown (1 of 4)]
[im 1/28]
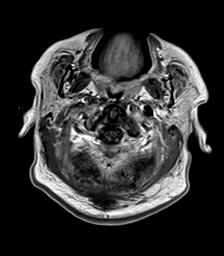
[im 28/28]
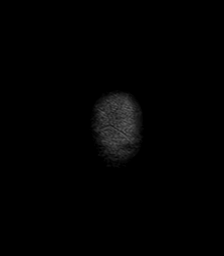

[Series 22: T1 post-contrast · coronal · 5.0mm · 0.57mm/px · 2 of 29 slices shown (2 of 4)]
[im 1/29]
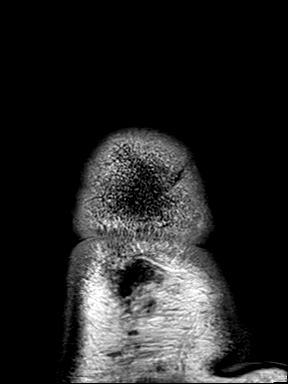
[im 29/29]
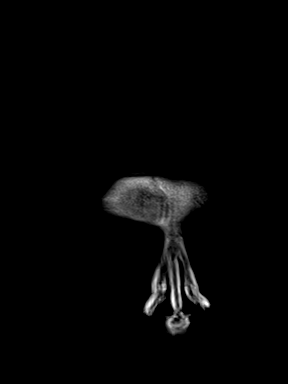

[Series 23: T1 post-contrast · sagittal · 5.0mm · 0.62mm/px · 2 of 25 slices shown (3 of 4)]
[im 1/25]
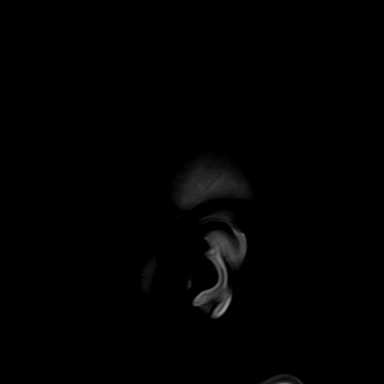
[im 25/25]
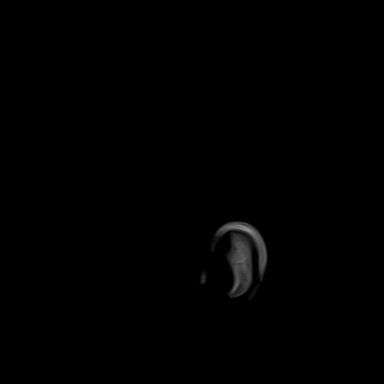

[Series 24: T1 post-contrast · coronal · 5.0mm · 0.90mm/px · 2 of 31 slices shown (4 of 4)]
[im 1/31]
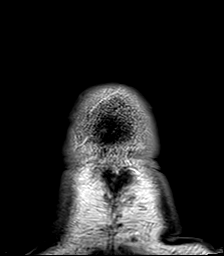
[im 31/31]
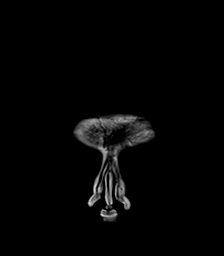

[48 of 48 positions shown; findings below may reference images not displayed]

FINDINGS: Brain: No restricted diffusion to suggest acute infarction. No
midline shift, mass effect, evidence of mass lesion,
ventriculomegaly, extra-axial collection or acute intracranial
hemorrhage. Cervicomedullary junction and pituitary are within
normal limits.

Widespread and confluent bilateral cerebral white matter T2 and
FLAIR hyperintensity. Similar extensive T2 and FLAIR heterogeneity
throughout the bilateral deep gray nuclei. Evidence of chronic
lacunar infarcts in the lateral left pons and left cerebellum.
Scattered chronic microhemorrhages in the bilateral thalami, right
temporal and occipital lobes, left cerebellum. No definite cortical
encephalomalacia.

No abnormal enhancement identified. No dural thickening.

Vascular: Major intracranial vascular flow voids are preserved.

Skull and upper cervical spine: Negative visible cervical spine.
Visualized bone marrow signal is within normal limits.

Sinuses/Orbits: Negative orbits. Paranasal sinuses are clear.

Other: Mastoids are well pneumatized. Grossly normal visible
internal auditory structures. Scalp and face soft tissues appear
negative.
IMPRESSION: 1. Evidence of chronically advanced small vessel disease but no
acute intracranial abnormality is identified.
2. Chronic microhemorrhages scattered in the deep gray nuclei, right
hemisphere, left cerebellum.

## 2019-10-18 IMAGING — CT CT CERVICAL SPINE W/O CM
3 of 4 series · 13 of 33 positions shown, 16 images · non-contrast
Comparison: Cervical spine radiographs [DATE]. chest CT
[DATE]

CLINICAL DATA: 73-year-old male with altered mental status,
confusion, ataxia.

EXAM:
CT CERVICAL SPINE WITHOUT CONTRAST
TECHNIQUE: Multidetector CT imaging of the cervical spine was performed without
intravenous contrast. Multiplanar CT image reconstructions were also
generated.

[Series 3: sagittal bone · sagittal · 0.28mm/px · 5 of 58 slices shown, 6 images]
[im 20/58  bone]
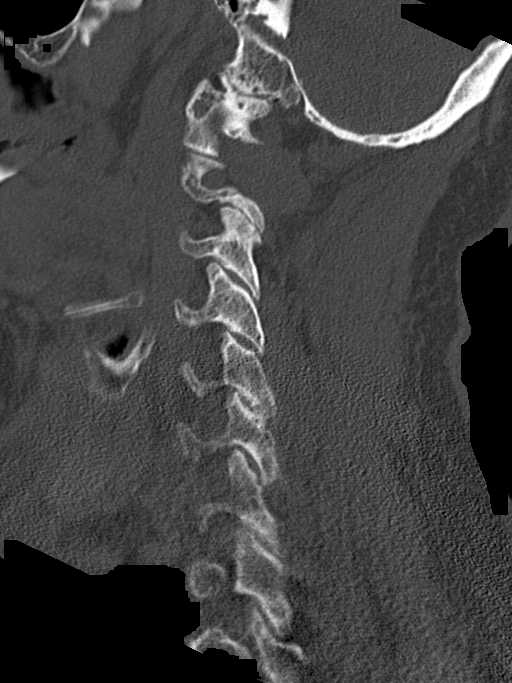
[im 24/58  bone]
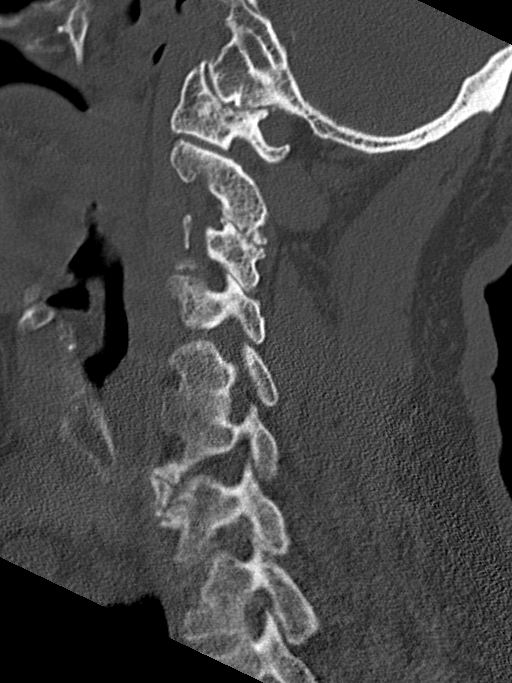
[im 29/58  soft-tissue]
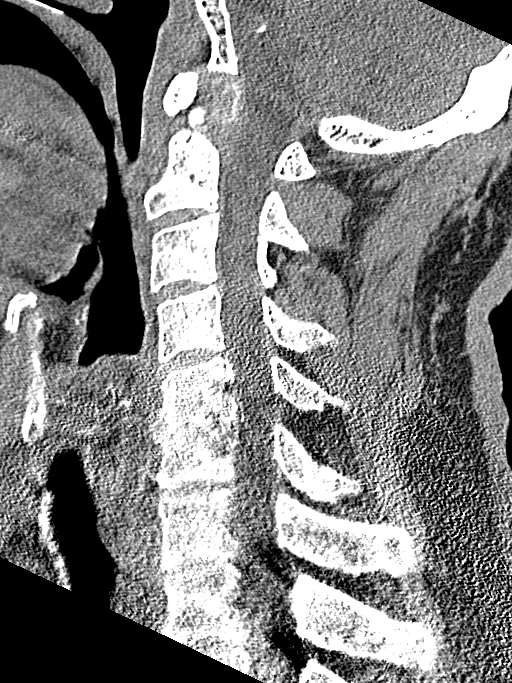
[im 29/58  bone]
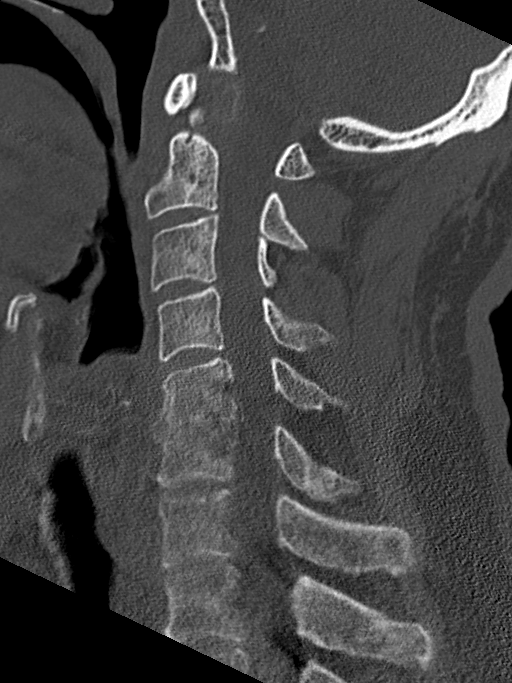
[im 34/58  bone]
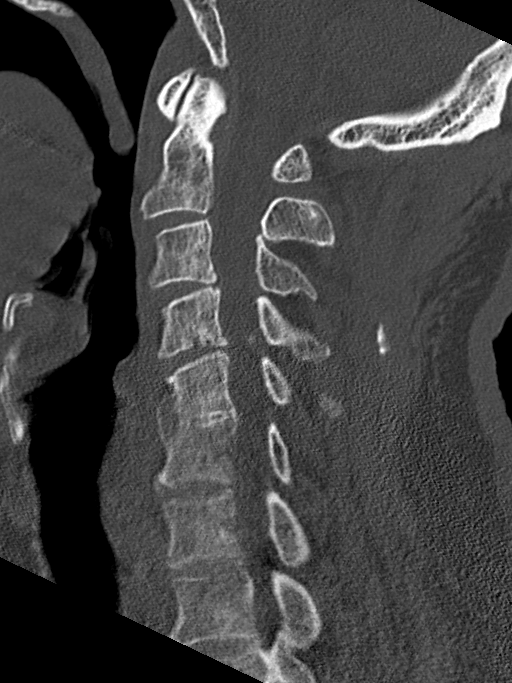
[im 39/58  bone]
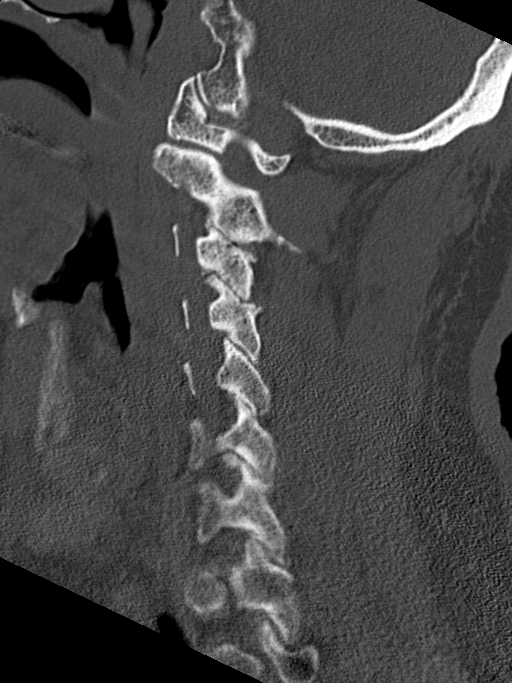

[Series 5: coronal bone · coronal · 0.28mm/px · 3 of 57 slices shown]
[im 12/57  bone]
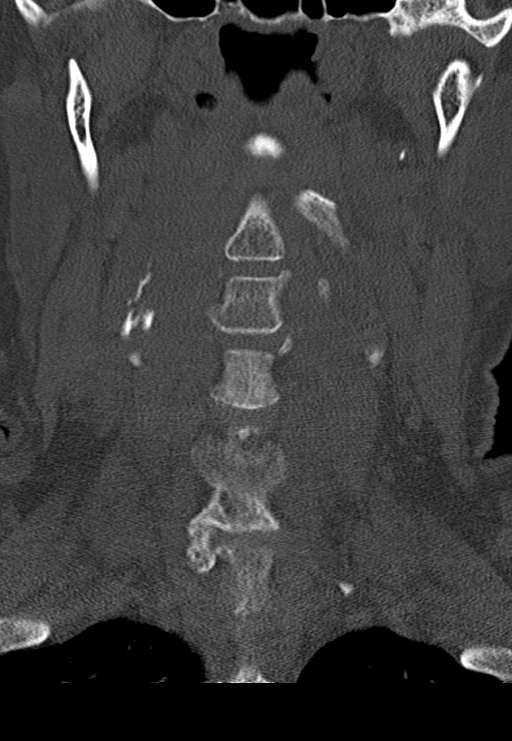
[im 23/57  bone]
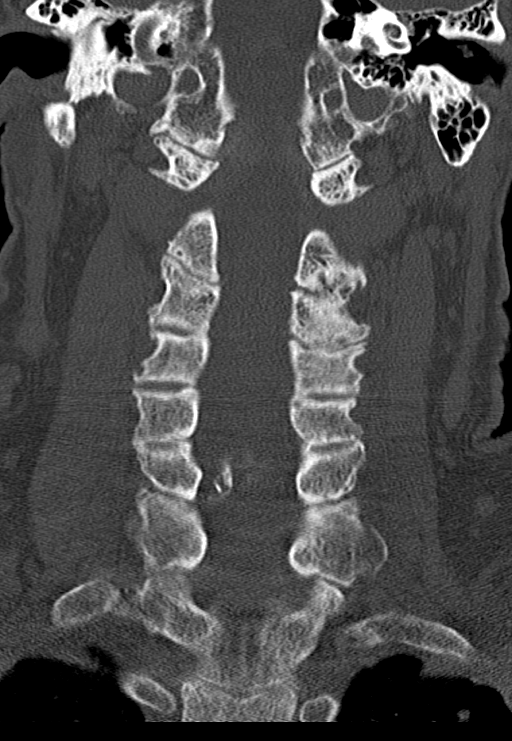
[im 34/57  bone]
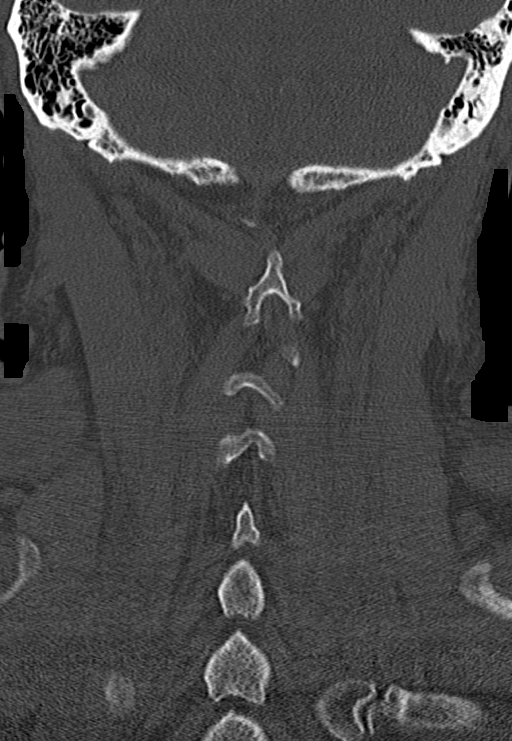

[Series 6: orthogonal bone · axial · 0.23mm/px · z∈[+210,+318]mm · 5 of 93 slices shown, 7 images]
[im 16/93  soft-tissue]
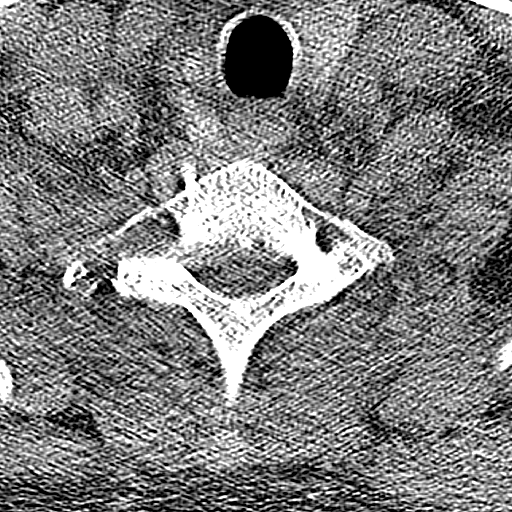
[im 16/93  bone]
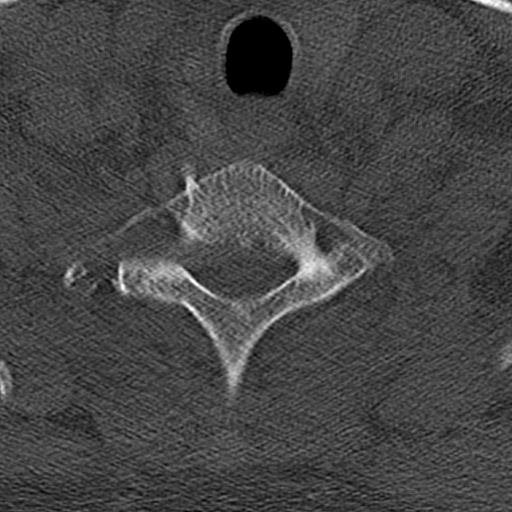
[im 31/93  bone]
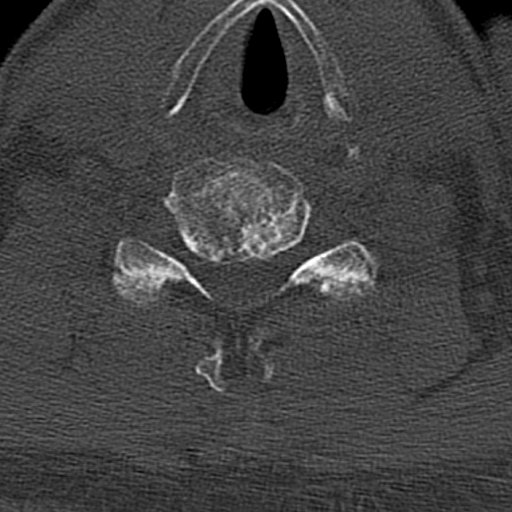
[im 47/93  bone]
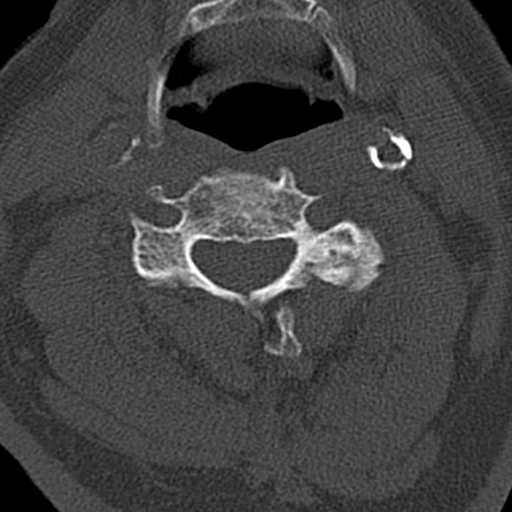
[im 62/93  bone]
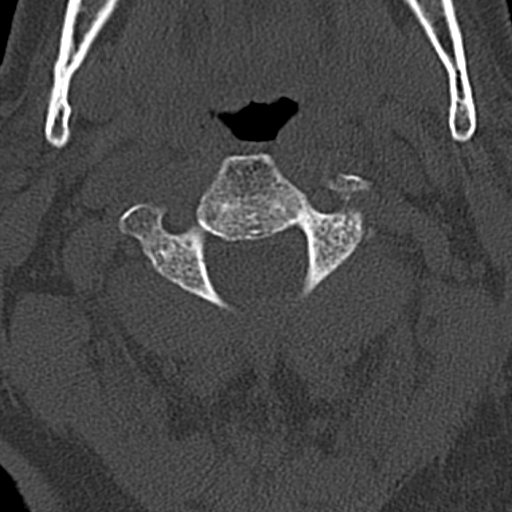
[im 77/93  soft-tissue]
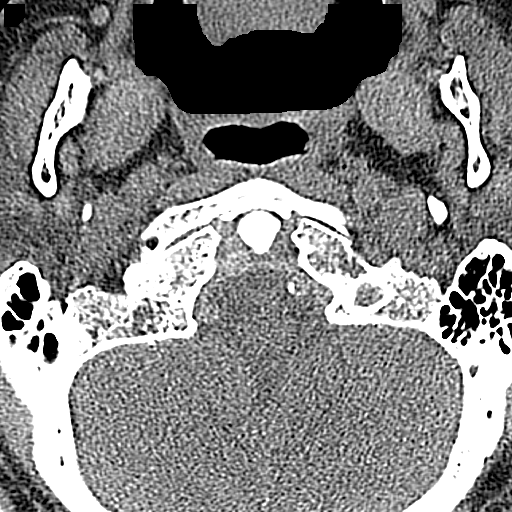
[im 77/93  bone]
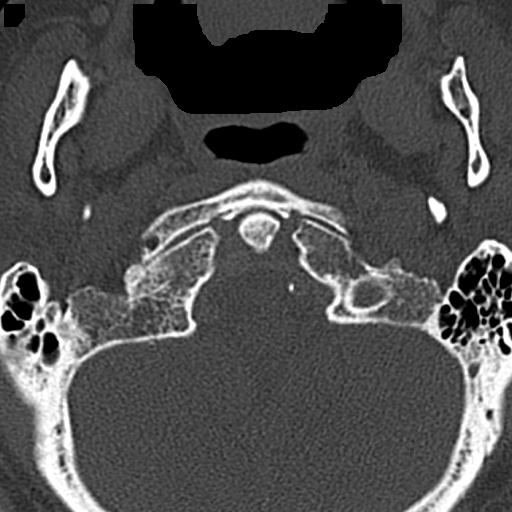

[13 of 33 positions shown; findings below may reference images not displayed]

FINDINGS: Alignment: Stable chronic straightening of cervical lordosis.
Cervicothoracic junction alignment is within normal limits.
Bilateral posterior element alignment is within normal limits.

Skull base and vertebrae: Visualized skull base is intact. No
atlanto-occipital dissociation. No acute osseous abnormality
identified. Chronic interbody arthrodesis or ankylosis at C5-C6.

Soft tissues and spinal canal: No prevertebral fluid or swelling. No
visible canal hematoma. Calcified atherosclerosis in the neck and at
the skull base. Otherwise negative noncontrast neck soft tissues.

Disc levels: Moderate right occipital [L5] degeneration with
joint space loss, subchondral sclerosis and small subchondral cysts.
Multilevel upper cervical facet arthropathy. Lower cervical chronic
disc and endplate degeneration.

Upper chest: Chronic left apical lung scarring appears stable from
the [DATE] CT. Visible upper thoracic levels appear intact.
IMPRESSION: 1. No acute osseous abnormality in the cervical spine.
2. Advanced skull base and cervical spine degeneration. Chronic
C5-C6 interbody ankylosis.
3. Left apical lung scarring appears stable from the [DATE]
Chest CT.

## 2019-10-18 IMAGING — CT CT HEAD W/O CM
3 series · 15 of 47 positions shown, 18 images · non-contrast
Comparison: Head CT [DATE].

CLINICAL DATA: 73-year-old male with altered mental status,
confusion, ataxia.

EXAM:
CT HEAD WITHOUT CONTRAST
TECHNIQUE: Contiguous axial images were obtained from the base of the skull
through the vertex without intravenous contrast.

[Series 2: head wo · axial · 0.41mm/px · z∈[+366,+496]mm · 9 of 32 slices shown, 12 images]
[im 3/32  brain]
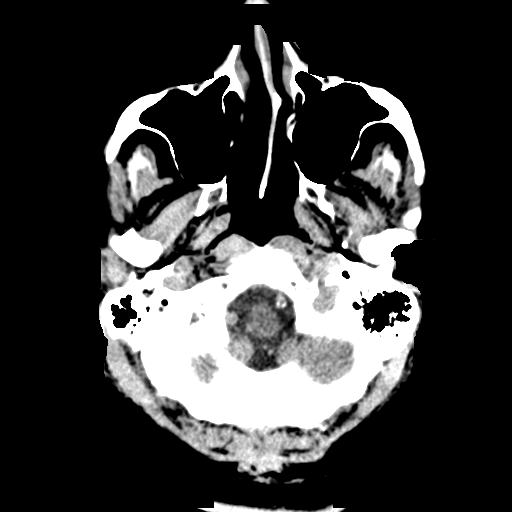
[im 3/32  bone]
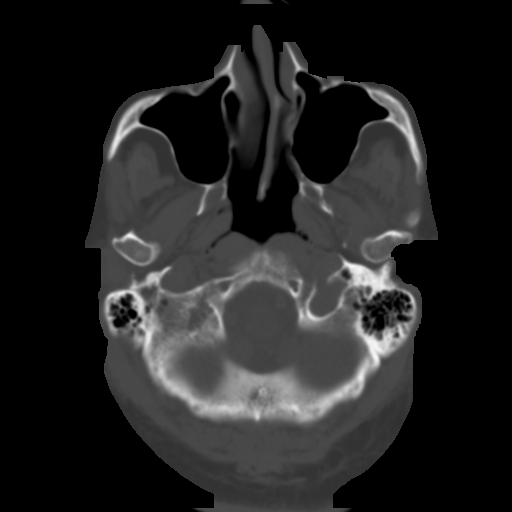
[im 6/32  brain]
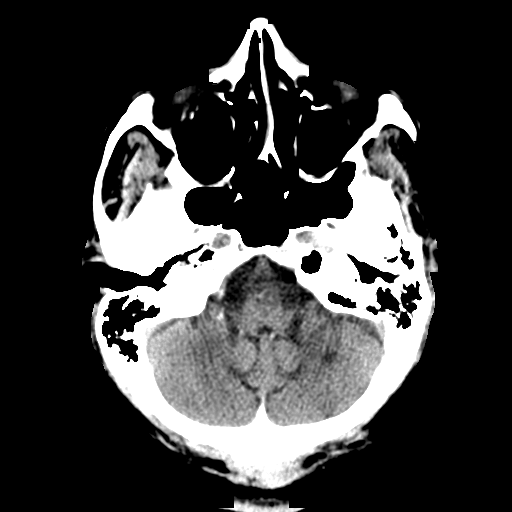
[im 9/32  brain]
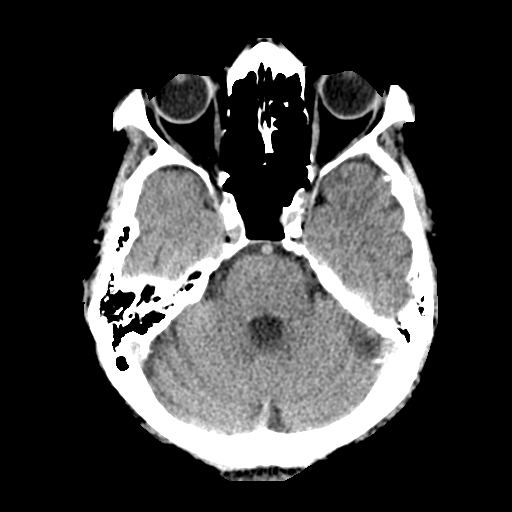
[im 12/32  brain]
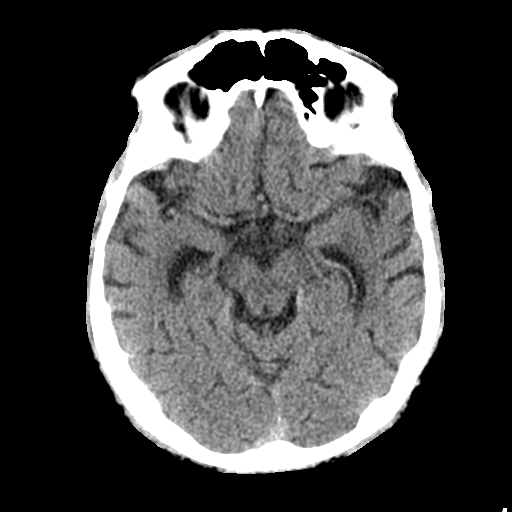
[im 17/32  brain]
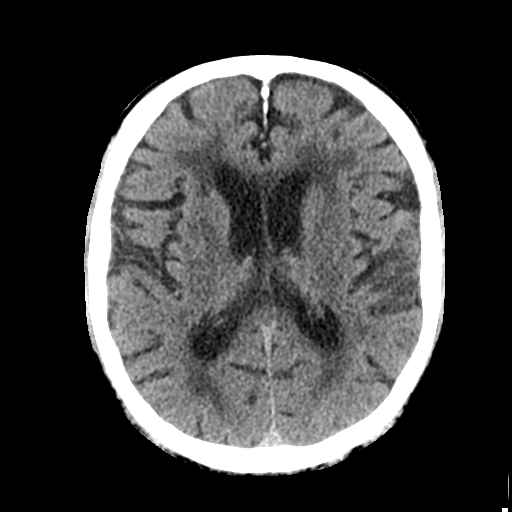
[im 17/32  bone]
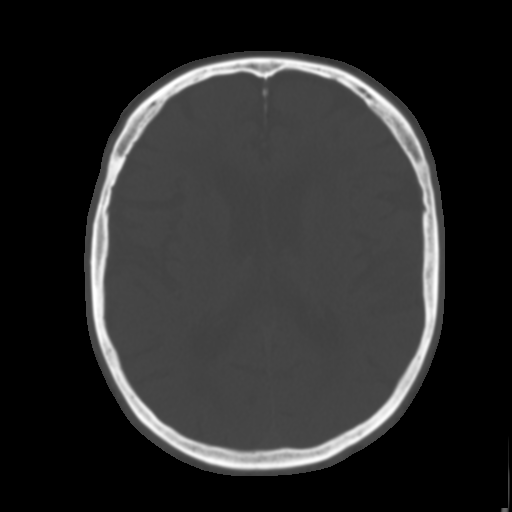
[im 20/32  brain]
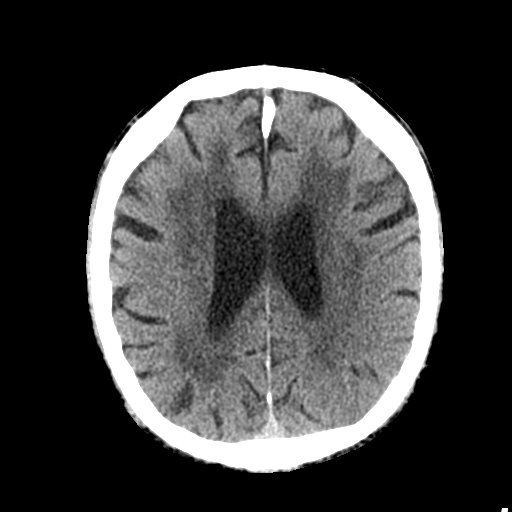
[im 23/32  brain]
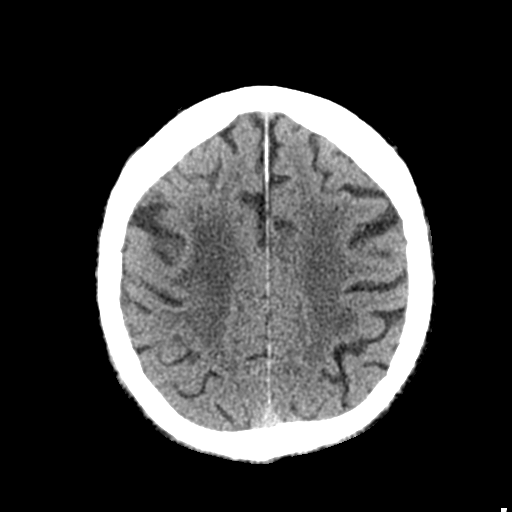
[im 26/32  brain]
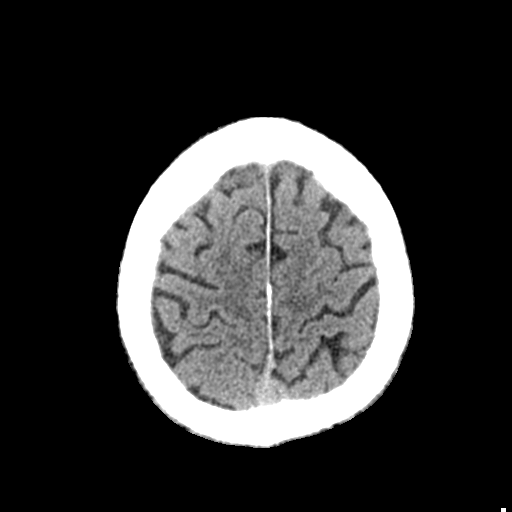
[im 29/32  brain]
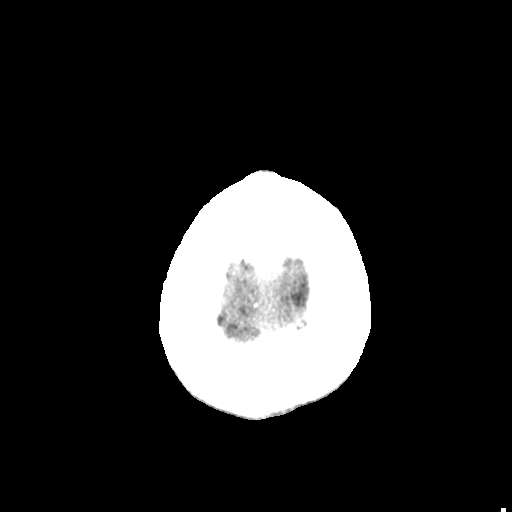
[im 29/32  bone]
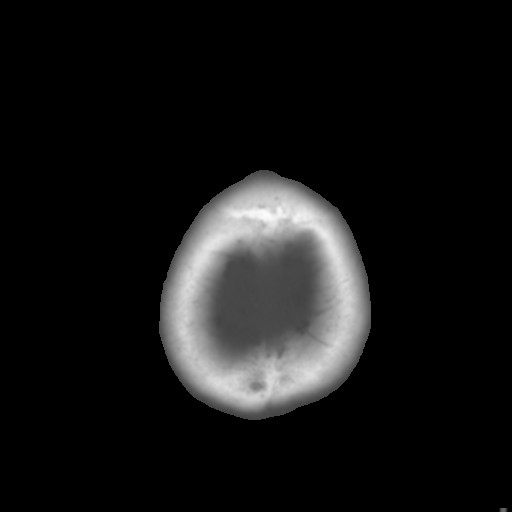

[Series 4: coronal soft tissue · coronal · 0.31mm/px · 3 of 64 slices shown]
[im 22/64  brain]
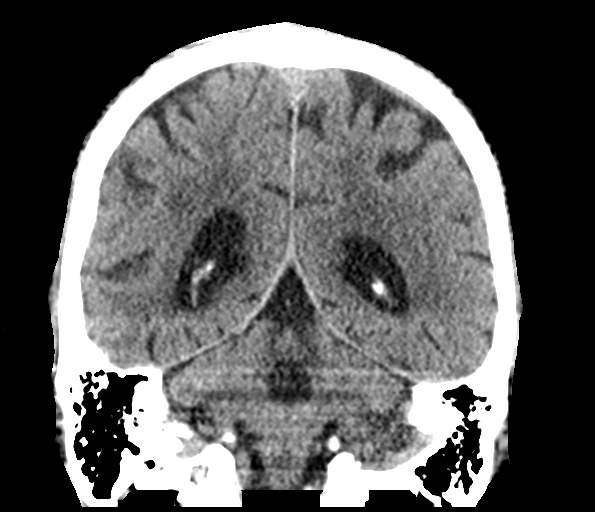
[im 29/64  brain]
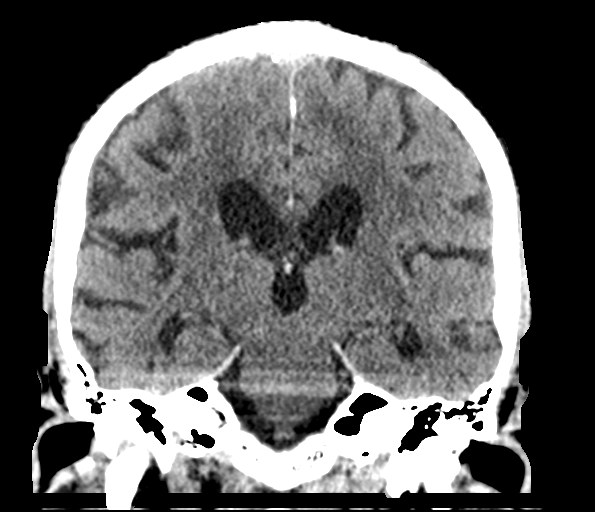
[im 36/64  brain]
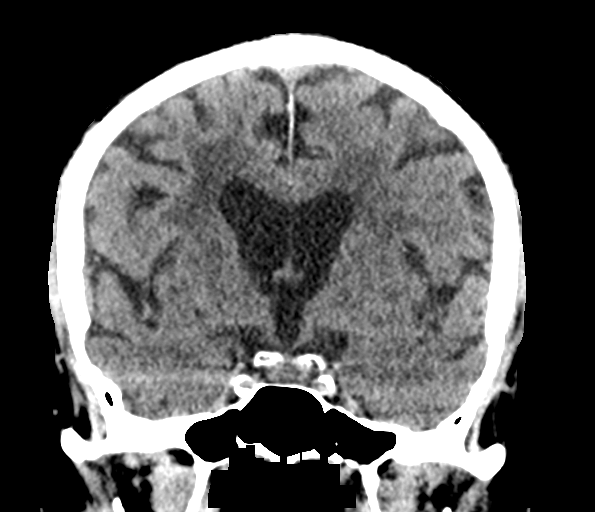

[Series 5: sagittal soft tissue · sagittal · 0.31mm/px · 3 of 57 slices shown]
[im 19/57  brain]
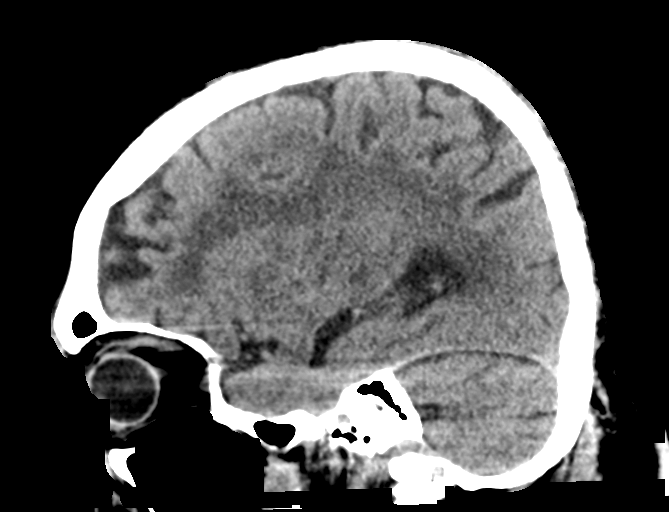
[im 29/57  brain]
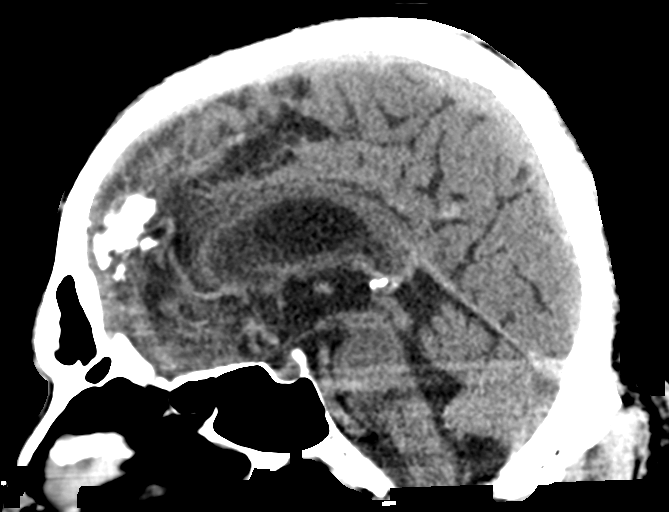
[im 38/57  brain]
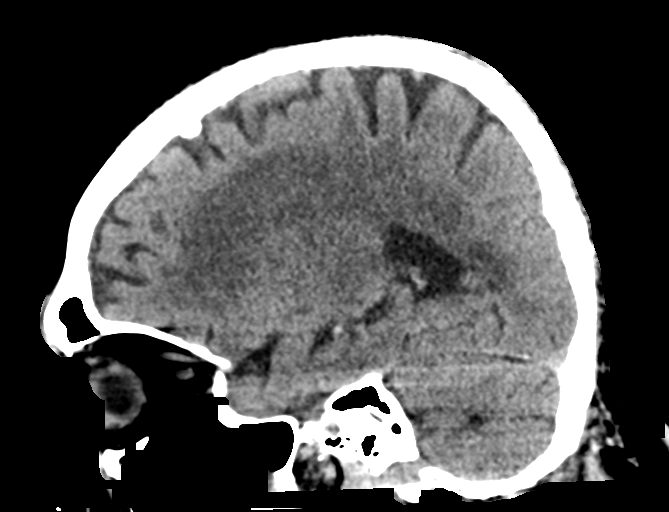

[15 of 47 positions shown; findings below may reference images not displayed]

FINDINGS: Brain: Cerebral volume is not significantly changed since [H3]. No
midline shift, mass effect, or evidence of intracranial mass lesion.
No ventriculomegaly.

Chronic Patchy and confluent bilateral white matter hypodensity has
not significantly changed. Chronic deep gray matter nuclei
heterogeneity appears stable. No cortically based acute infarct
identified. No definite cortical encephalomalacia. No acute
intracranial hemorrhage identified.

Vascular: Calcified atherosclerosis at the skull base. No suspicious
intracranial vascular hyperdensity.

Skull: Stable.  No acute osseous abnormality identified.

Sinuses/Orbits: Visualized paranasal sinuses and mastoids are clear.

Other: No acute orbit or scalp soft tissue finding.
IMPRESSION: No acute intracranial abnormality. Chronic small-vessel disease
suspected but not significantly changed by CT since [H3].

## 2019-10-18 MED ORDER — SEMAGLUTIDE(0.25 OR 0.5MG/DOS) 2 MG/1.5ML ~~LOC~~ SOPN: 0.5000 mg | PEN_INJECTOR | SUBCUTANEOUS | Status: DC

## 2019-10-18 MED ORDER — SENNOSIDES-DOCUSATE SODIUM 8.6-50 MG PO TABS: 1.0000 | ORAL_TABLET | Freq: Every evening | ORAL | Status: DC | PRN

## 2019-10-18 MED ORDER — MECLIZINE HCL 12.5 MG PO TABS
12.5000 mg | ORAL_TABLET | Freq: Three times a day (TID) | ORAL | Status: DC | PRN
  Filled 2019-10-18: qty 1

## 2019-10-18 MED ORDER — STROKE: EARLY STAGES OF RECOVERY BOOK: Freq: Once | Status: AC

## 2019-10-18 MED ORDER — AMLODIPINE BESYLATE 10 MG PO TABS
10.0000 mg | ORAL_TABLET | Freq: Every day | ORAL | Status: DC
  Administered 2019-10-19 – 2019-10-20 (×2): 10 mg via ORAL
  Filled 2019-10-18: qty 1
  Filled 2019-10-18: qty 2

## 2019-10-18 MED ORDER — LABETALOL HCL 5 MG/ML IV SOLN
5.0000 mg | Freq: Once | INTRAVENOUS | Status: AC
  Administered 2019-10-18: 22:00:00 5 mg via INTRAVENOUS
  Filled 2019-10-18: qty 4

## 2019-10-18 MED ORDER — ACETAMINOPHEN 325 MG PO TABS: 650.0000 mg | ORAL_TABLET | ORAL | Status: DC | PRN

## 2019-10-18 MED ORDER — GADOBUTROL 1 MMOL/ML IV SOLN
9.0000 mL | Freq: Once | INTRAVENOUS | Status: AC | PRN
  Administered 2019-10-18: 9 mL via INTRAVENOUS

## 2019-10-18 MED ORDER — ACETAMINOPHEN 160 MG/5ML PO SOLN
650.0000 mg | ORAL | Status: DC | PRN
  Filled 2019-10-18: qty 20.3

## 2019-10-18 MED ORDER — ENOXAPARIN SODIUM 40 MG/0.4ML ~~LOC~~ SOLN
40.0000 mg | SUBCUTANEOUS | Status: DC
  Administered 2019-10-19 – 2019-10-20 (×2): 40 mg via SUBCUTANEOUS
  Filled 2019-10-18 (×2): qty 0.4

## 2019-10-18 MED ORDER — ENALAPRIL MALEATE 20 MG PO TABS
20.0000 mg | ORAL_TABLET | Freq: Every day | ORAL | Status: DC
  Administered 2019-10-19 – 2019-10-20 (×2): 20 mg via ORAL
  Filled 2019-10-18: qty 2
  Filled 2019-10-18: qty 1

## 2019-10-18 MED ORDER — ATORVASTATIN CALCIUM 20 MG PO TABS
40.0000 mg | ORAL_TABLET | Freq: Every day | ORAL | Status: DC
  Administered 2019-10-19: 19:00:00 40 mg via ORAL
  Filled 2019-10-18: qty 2

## 2019-10-18 MED ORDER — INSULIN ASPART PROT & ASPART (70-30 MIX) 100 UNIT/ML ~~LOC~~ SUSP
20.0000 [IU] | Freq: Every day | SUBCUTANEOUS | Status: DC
  Administered 2019-10-19: 12:00:00 20 [IU] via SUBCUTANEOUS
  Filled 2019-10-18: qty 10

## 2019-10-18 MED ORDER — SODIUM CHLORIDE 0.9 % IV SOLN: INTRAVENOUS | Status: DC

## 2019-10-18 MED ORDER — ASPIRIN EC 81 MG PO TBEC
81.0000 mg | DELAYED_RELEASE_TABLET | Freq: Every day | ORAL | Status: DC
  Administered 2019-10-19 – 2019-10-20 (×2): 81 mg via ORAL
  Filled 2019-10-18 (×2): qty 1

## 2019-10-18 MED ORDER — ACETAMINOPHEN 650 MG RE SUPP: 650.0000 mg | RECTAL | Status: DC | PRN
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
## 2019-10-18 NOTE — ED Notes (Signed)
Pt to MRI

## 2019-10-18 NOTE — ED Notes (Signed)
Victor Fox  [phone] Friend, permission to update. Will provide transport

## 2019-10-18 NOTE — ED Provider Notes (Signed)
[facility] Emergency Department Provider Note  ____________________________________________   First MD Initiated Contact with Patient 10/18/19 2055     (approximate)  I have reviewed the triage vital signs and the nursing notes.   HISTORY  Chief Complaint Altered Mental Status    HPI Victor Fox is a 74 y.o. male with history of hypertension, hyperlipidemia who comes in with altered mental status.  Patient lives with a gentleman named Victor Fox.  Victor Fox called EMS because there was concern that he was not acting his normal self.  Patient seem to be only oriented towards his name which is different for him.  Patient is able to carry on a conversation but sometimes seems little confused and this is different than normal.  Unclear what his last known normal was.  Patient denies any chest pain, shortness of breath, abdominal pain.  States he has a little bit of neck pain but is not that he has had any falls       Past Medical History:  Diagnosis Date  . Arthritis   . Benign neoplasm of unspecified site   . Carotid artery disease ([place])    a. carotid duplex 05/2015: less tahn 39% stenosis bilaterally, stable over the past year, recommend repeat imaging in 2 years (05/2017)  . Compression fracture    a. L2  . CVA (cerebral vascular accident) ([place]) 06/2012  . Diabetes mellitus without complication ([place])   . Heart murmur    a. echo 06/2012: EF >55%, LVH, aortic valve sclerosis  . Hyperlipidemia   . Hypertension   . Neuropathy   . Pneumonia   . Tobacco abuse    a. smoked x 25 years  . Vitamin D deficiency     Patient Active Problem List   Diagnosis Date Noted  . Pain due to onychomycosis of toenails of both feet 02/21/2019  . Adenomatous polyp 11/05/2018  . Blood pressure elevated 11/05/2018  . Neuropathy 11/05/2018  . Pneumococcal vaccination given 11/05/2018  . Pulmonary nodules 10/08/2018  . Coronary artery disease 10/08/2018  . CAP (community  acquired pneumonia) 09/01/2018  . Overweight (BMI 25.0-29.9) 03/10/2018  . 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 Benign neoplasm 01/19/2018  . Onychomycosis due to dermatophyte 01/19/2018  . 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 Neck pain 01/19/2018  . Vitamin D deficiency 01/19/2018  . Aortic stenosis, mild 01/05/2018  . 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 Vertigo 12/17/2017  . Uncontrolled diabetes mellitus with complication, with long-term current use of insulin ([place]) 12/17/2017  . Hyperlipidemia 04/14/2016  . Pain and swelling of left wrist 07/10/2015  . Carotid artery disease ([place])   . Heart murmur 10/03/2013  . HTN (hypertension) 10/03/2013  . CVA (cerebral vascular accident) ([place]) 10/03/2013  . Carotid artery stenosis 10/03/2013    Past Surgical History:  Procedure Laterality Date  . ARTHRODESIS    . BACK SURGERY     x 2  . discectomy    . trigger release      Prior to Admission medications   Medication Sig Start Date End Date Taking? Authorizing Provider  amLODipine (NORVASC) 10 MG tablet Take 1 tablet (10 mg total) by mouth daily. Appointment needed please for further refills 07/05/18   [name], MD  aspirin EC 81 MG tablet Take 81 mg by mouth daily.    [provider]  atorvastatin (LIPITOR) 40 MG tablet Take 1 tablet (40 mg total) by mouth at bedtime. 12/24/18  [name], MD  B-D UF III MINI PEN NEEDLES 31G X 5 MM MISC use once daily 01/28/17   Roselee Nova, MD  blood glucose meter kit and supplies KIT Dispense based on patient and insurance preference. Use up to four times daily as directed. (FOR ICD-9 250.00, 250.01). 11/23/18   Poulose, Bethel Born, NP  enalapril (VASOTEC) 20 MG tablet Take 1 tablet (20 mg total) by mouth daily. 12/24/18   [name], MD  Insulin Isophane & Regular Human (NOVOLIN 70/30 FLEXPEN) (70-30) 100 UNIT/ML PEN Inject 30 Units into the skin daily. Pt takes daily at 12 noon prior  to going to work    [provider]  Lancets (ONETOUCH DELICA PLUS DTOIZT24P) MISC USE QID UTD 11/23/18   [provider]  meclizine (ANTIVERT) 25 MG tablet TAKE 1 TABLET(25 MG) BY MOUTH THREE TIMES DAILY AS NEEDED FOR DIZZINESS 09/06/18   [name], [name], MD  metFORMIN (GLUCOPHAGE-XR) 500 MG 24 hr tablet Take 1 tablet (500 mg total) by mouth 2 (two) times daily. 12/24/18   [name], MD  ONE TOUCH ULTRA TEST test strip USE QID UTD 11/23/18   [provider]  Semaglutide,0.25 or 0.5MG/DOS, (OZEMPIC, 0.25 OR 0.5 MG/DOSE,) 2 MG/1.5ML SOPN Inject 0.5 mg into the skin once a week. 12/24/18   [name], MD    Allergies Patient has no known allergies.  Family History  Problem Relation Age of Onset  . Heart disease Brother   . Hypertension Mother     Social History Social History   Tobacco Use  . Smoking status: Former Smoker    Packs/day: 1.00    Years: 10.00    Pack years: 10.00    Types: Cigarettes  . Smokeless tobacco: Never Used  Substance Use Topics  . Alcohol use: No  . Drug use: No      Review of Systems Constitutional: No fever/chills Eyes: No visual changes. ENT: No sore throat. Cardiovascular: Denies chest pain. Respiratory: Denies shortness of breath. Gastrointestinal: No abdominal pain.  No nausea, no vomiting.  No diarrhea.  No constipation. Genitourinary: Negative for dysuria. Musculoskeletal: Negative for back pain. Skin: Negative for rash. Neurological: Negative for headaches, focal weakness or numbness.  Mild confusion All other ROS negative ____________________________________________   PHYSICAL EXAM:  VITAL SIGNS: Blood pressure (!) 236/82, pulse 64, resp. rate 17, height 6' (1.829 m), weight 90.7 kg, SpO2 99 %.   Constitutional: Alert and oriented x2. Well appearing and in no acute distress. Eyes: Conjunctivae are normal. EOMI. Head: Atraumatic. Nose: No congestion/rhinnorhea. Mouth/Throat: Mucous membranes are  moist.   Neck: No stridor. Trachea Midline. FROM.  Mild C-spine tenderness Cardiovascular: Normal rate, regular rhythm. Grossly normal heart sounds.  Good peripheral circulation. Respiratory: Normal respiratory effort.  No retractions. Lungs CTAB. Gastrointestinal: Soft and nontender. No distention. No abdominal bruits.  Musculoskeletal: No lower extremity tenderness nor edema.  No joint effusions. Neurologic:  Normal speech and language. No gross focal neurologic deficits are appreciated.  Cranial nerves II through XII appear intact.  Equal strength in the arms and the legs. Skin:  Skin is warm, dry and intact. No rash noted. Psychiatric: Mood and affect are normal. Speech and behavior are normal. GU: Deferred   ____________________________________________   LABS (all labs ordered are listed, but only abnormal results are displayed)  Labs Reviewed  RESPIRATORY PANEL BY RT PCR (FLU A&B, COVID)  CBC WITH DIFFERENTIAL/PLATELET  COMPREHENSIVE METABOLIC PANEL  LACTIC ACID, PLASMA  LACTIC ACID, PLASMA  URINE DRUG SCREEN, QUALITATIVE ([facility] ONLY)  ETHANOL  URINALYSIS, ROUTINE W REFLEX MICROSCOPIC  TROPONIN I (HIGH SENSITIVITY)   ____________________________________________   ED ECG REPORT I, Vanessa Carter, the attending physician, personally viewed and interpreted this ECG.  EKG is normal sinus rate of 62, no ST elevation, no T wave inversions, normal intervals ____________________________________________  RADIOLOGY   Official radiology report(s): CT Head Wo Contrast  Result Date: 10/18/2019 CLINICAL DATA:  74 year old male with altered mental status, confusion, ataxia. EXAM: CT HEAD WITHOUT CONTRAST TECHNIQUE: Contiguous axial images were obtained from the base of the skull through the vertex without intravenous contrast. COMPARISON:  Head CT 05/02/2013. FINDINGS: Brain: Cerebral volume is not significantly changed since 2014. No midline shift, mass effect, or evidence of  intracranial mass lesion. No ventriculomegaly. Chronic Patchy and confluent bilateral white matter hypodensity has not significantly changed. Chronic deep gray matter nuclei heterogeneity appears stable. No cortically based acute infarct identified. No definite cortical encephalomalacia. No acute intracranial hemorrhage identified. Vascular: Calcified atherosclerosis at the skull base. No suspicious intracranial vascular hyperdensity. Skull: Stable.  No acute osseous abnormality identified. Sinuses/Orbits: Visualized paranasal sinuses and mastoids are clear. Other: No acute orbit or scalp soft tissue finding. IMPRESSION: No acute intracranial abnormality. Chronic small-vessel disease suspected but not significantly changed by CT since 2014. Electronically Signed   By: Genevie Ann M.D.   On: 10/18/2019 21:30   CT Cervical Spine Wo Contrast  Result Date: 10/18/2019 CLINICAL DATA:  74 year old male with altered mental status, confusion, ataxia. EXAM: CT CERVICAL SPINE WITHOUT CONTRAST TECHNIQUE: Multidetector CT imaging of the cervical spine was performed without intravenous contrast. Multiplanar CT image reconstructions were also generated. COMPARISON:  Cervical spine radiographs 06/26/2014. chest CT 09/01/2018 FINDINGS: Alignment: Stable chronic straightening of cervical lordosis. Cervicothoracic junction alignment is within normal limits. Bilateral posterior element alignment is within normal limits. Skull base and vertebrae: Visualized skull base is intact. No atlanto-occipital dissociation. No acute osseous abnormality identified. Chronic interbody arthrodesis or ankylosis at C5-C6. Soft tissues and spinal canal: No prevertebral fluid or swelling. No visible canal hematoma. Calcified atherosclerosis in the neck and at the skull base. Otherwise negative noncontrast neck soft tissues. Disc levels: Moderate right occipital condyle-C1 degeneration with joint space loss, subchondral sclerosis and small subchondral  cysts. Multilevel upper cervical facet arthropathy. Lower cervical chronic disc and endplate degeneration. Upper chest: Chronic left apical lung scarring appears stable from the 09/01/2018 CT. Visible upper thoracic levels appear intact. IMPRESSION: 1. No acute osseous abnormality in the cervical spine. 2. Advanced skull base and cervical spine degeneration. Chronic C5-C6 interbody ankylosis. 3. Left apical lung scarring appears stable from the 09/01/2018 Chest CT. Electronically Signed   By: Genevie Ann M.D.   On: 10/18/2019 21:35    ____________________________________________   PROCEDURES  Procedure(s) performed (including Critical Care):  Procedures   ____________________________________________   INITIAL IMPRESSION / ASSESSMENT AND PLAN / ED COURSE  Victor Fox was evaluated in Emergency Department on 10/18/2019 for the symptoms described in the history of present illness. He was evaluated in the context of the global COVID-19 pandemic, which necessitated consideration that the patient might be at risk for infection with the SARS-CoV-2 virus that causes COVID-19. Institutional protocols and algorithms that pertain to the evaluation of patients at risk for COVID-19 are in a state of rapid change based on information released by regulatory bodies including the CDC and federal and state organizations. These policies and algorithms were followed during the patient's  care in the ED.    Patient is a 74 year old gentleman who denies any complaints but has some worsening confusion per somebody who lives with him.  Unclear when it started.  Patient is alert and oriented x2.  Will get labs to evaluate for electrolyte abnormalities, AKI, UTI.  CT head to evaluate for intracranial hemorrhage.  Patient denies he had a fall but given he is not a great historian will get CT cervical to evaluate for cervical fracture given he reports some C-spine tenderness.   Labs are reassuring.  Discussed with patient's  friend Victor Fox who lives with him.  Patient states that he was more confused today.  He did not know where to drive to to pick him up for work which is atypical.  He is not driving in the right ear.  He noted that he had slurred speech and was repeating things.  This was an acute change from his baseline.  Patient given some labetalol for his hypertension.  Do not want to lower to acutely given this could be a ischemic stroke.  Given the altered mental status and patient did not even know his birthday or his last name and we evaluated him will discuss with the hospital team for admission for altered mental status and will add on an MRI with and without contrast to evaluate for press versus ischemic stroke      ____________________________________________   FINAL CLINICAL IMPRESSION(S) / ED DIAGNOSES   Final diagnoses:  Altered mental status, unspecified altered mental status type  Hypertension, unspecified type      MEDICATIONS GIVEN DURING THIS VISIT:  Medications  labetalol (NORMODYNE) injection 5 mg (5 mg Intravenous Given 10/18/19 2204)     ED Discharge Orders    None       Note:  This document was prepared using Dragon voice recognition software and may include unintentional dictation errors.   Vanessa [place], MD 10/18/19 [phone]

## 2019-10-18 NOTE — ED Triage Notes (Signed)
*  pt friend/roommate, no wife

## 2019-10-18 NOTE — ED Triage Notes (Signed)
Pt arrives from home via [facility] with wife's complaint of altered mental status. Pt is mildly confused. Pt family reports onset of early this afternoon. "he's just not quite like himself, and having trouble remembering things." EMS reports negative stroke screen. No pain, able to ambulate

## 2019-10-19 ENCOUNTER — Observation Stay (HOSPITAL_BASED_OUTPATIENT_CLINIC_OR_DEPARTMENT_OTHER)
Admit: 2019-10-19 | Discharge: 2019-10-19 | Disposition: A | Discharge status: Home / Self Care | Payer: Medicare Other | Attending: Family Medicine | Admitting: Family Medicine

## 2019-10-19 ENCOUNTER — Observation Stay: Payer: Medicare Other

## 2019-10-19 DIAGNOSIS — Z794 Long term (current) use of insulin: Secondary | ICD-10-CM | POA: Diagnosis not present

## 2019-10-19 DIAGNOSIS — I161 Hypertensive emergency: Secondary | ICD-10-CM | POA: Diagnosis present

## 2019-10-19 DIAGNOSIS — Z8673 Personal history of transient ischemic attack (TIA), and cerebral infarction without residual deficits: Secondary | ICD-10-CM | POA: Diagnosis not present

## 2019-10-19 DIAGNOSIS — R4182 Altered mental status, unspecified: Secondary | ICD-10-CM | POA: Diagnosis not present

## 2019-10-19 DIAGNOSIS — M47812 Spondylosis without myelopathy or radiculopathy, cervical region: Secondary | ICD-10-CM | POA: Diagnosis present

## 2019-10-19 DIAGNOSIS — G9341 Metabolic encephalopathy: Secondary | ICD-10-CM | POA: Diagnosis not present

## 2019-10-19 DIAGNOSIS — E785 Hyperlipidemia, unspecified: Secondary | ICD-10-CM | POA: Diagnosis present

## 2019-10-19 DIAGNOSIS — I2510 Atherosclerotic heart disease of native coronary artery without angina pectoris: Secondary | ICD-10-CM | POA: Diagnosis present

## 2019-10-19 DIAGNOSIS — R2689 Other abnormalities of gait and mobility: Secondary | ICD-10-CM | POA: Diagnosis present

## 2019-10-19 DIAGNOSIS — G459 Transient cerebral ischemic attack, unspecified: Secondary | ICD-10-CM | POA: Diagnosis present

## 2019-10-19 DIAGNOSIS — I350 Nonrheumatic aortic (valve) stenosis: Secondary | ICD-10-CM | POA: Diagnosis not present

## 2019-10-19 DIAGNOSIS — Z20822 Contact with and (suspected) exposure to covid-19: Secondary | ICD-10-CM | POA: Diagnosis present

## 2019-10-19 DIAGNOSIS — R9401 Abnormal electroencephalogram [EEG]: Secondary | ICD-10-CM | POA: Diagnosis present

## 2019-10-19 DIAGNOSIS — E1151 Type 2 diabetes mellitus with diabetic peripheral angiopathy without gangrene: Secondary | ICD-10-CM | POA: Diagnosis present

## 2019-10-19 DIAGNOSIS — I674 Hypertensive encephalopathy: Secondary | ICD-10-CM | POA: Diagnosis present

## 2019-10-19 DIAGNOSIS — I160 Hypertensive urgency: Secondary | ICD-10-CM | POA: Diagnosis present

## 2019-10-19 DIAGNOSIS — G9340 Encephalopathy, unspecified: Secondary | ICD-10-CM | POA: Diagnosis present

## 2019-10-19 DIAGNOSIS — Z79899 Other long term (current) drug therapy: Secondary | ICD-10-CM | POA: Diagnosis not present

## 2019-10-19 DIAGNOSIS — E559 Vitamin D deficiency, unspecified: Secondary | ICD-10-CM | POA: Diagnosis present

## 2019-10-19 DIAGNOSIS — Z8249 Family history of ischemic heart disease and other diseases of the circulatory system: Secondary | ICD-10-CM | POA: Diagnosis not present

## 2019-10-19 DIAGNOSIS — I10 Essential (primary) hypertension: Secondary | ICD-10-CM | POA: Diagnosis present

## 2019-10-19 DIAGNOSIS — E1165 Type 2 diabetes mellitus with hyperglycemia: Secondary | ICD-10-CM | POA: Diagnosis present

## 2019-10-19 DIAGNOSIS — Z7982 Long term (current) use of aspirin: Secondary | ICD-10-CM | POA: Diagnosis not present

## 2019-10-19 DIAGNOSIS — Z981 Arthrodesis status: Secondary | ICD-10-CM | POA: Diagnosis not present

## 2019-10-19 LAB — GLUCOSE, CAPILLARY
Glucose-Capillary: 248 mg/dL — ABNORMAL HIGH (ref 70–99)
Glucose-Capillary: 197 mg/dL — ABNORMAL HIGH (ref 70–99)
Glucose-Capillary: 289 mg/dL — ABNORMAL HIGH (ref 70–99)
Glucose-Capillary: 174 mg/dL — ABNORMAL HIGH (ref 70–99)
Glucose-Capillary: 179 mg/dL — ABNORMAL HIGH (ref 70–99)

## 2019-10-19 LAB — LIPID PANEL
Cholesterol: 185 mg/dL (ref 0–200)
HDL: 36 mg/dL — ABNORMAL LOW (ref >40)
LDL Cholesterol: 116 mg/dL — ABNORMAL HIGH (ref 0–99)
Total CHOL/HDL Ratio: 5.1 RATIO
Triglycerides: 164 mg/dL — ABNORMAL HIGH (ref <150)
VLDL: 33 mg/dL (ref 0–40)

## 2019-10-19 LAB — HEMOGLOBIN A1C
Hgb A1c MFr Bld: 9.6 % — ABNORMAL HIGH (ref 4.8–5.6)
Mean Plasma Glucose: 228.82 mg/dL

## 2019-10-19 LAB — ECHOCARDIOGRAM COMPLETE
Height: 72 in
Weight: 3200 oz

## 2019-10-19 IMAGING — US US CAROTID DUPLEX BILAT
1 series · 13 of 24 positions shown · non-contrast
Comparison: [DATE]

CLINICAL DATA: TIA. History of stroke, hyperlipidemia and diabetes.

EXAM:
BILATERAL CAROTID DUPLEX ULTRASOUND
TECHNIQUE: Gray scale imaging, color Doppler and duplex ultrasound were
performed of bilateral carotid and vertebral arteries in the neck.

[Series 1: us carotid duplex bilat · 13 of 75 slices shown]
[im 1/75]
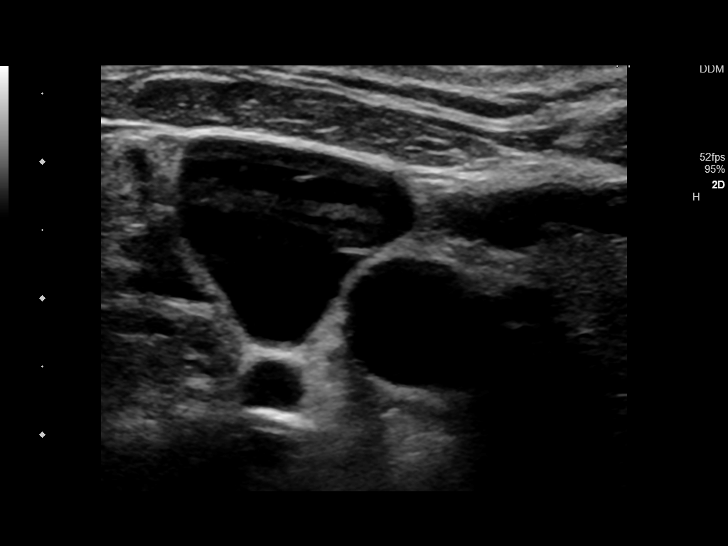
[im 7/75]
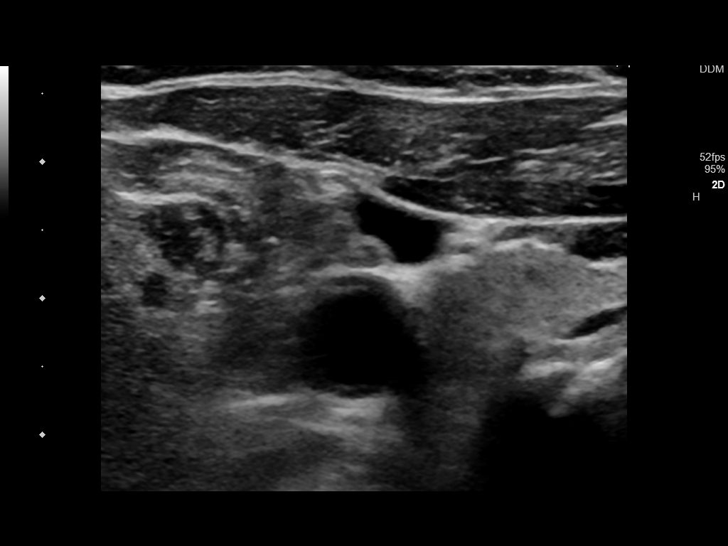
[im 13/75]
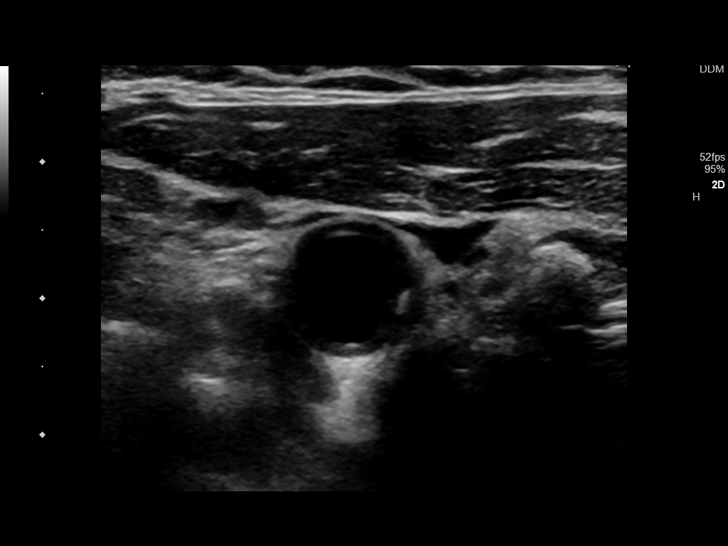
[im 20/75]
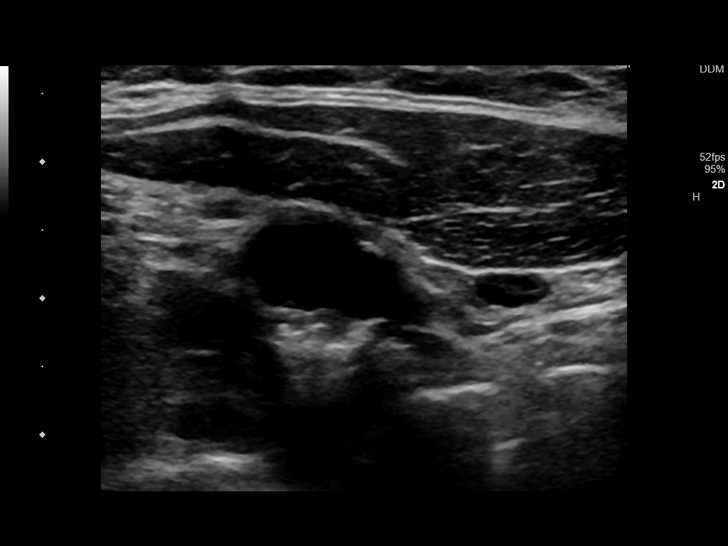
[im 26/75]
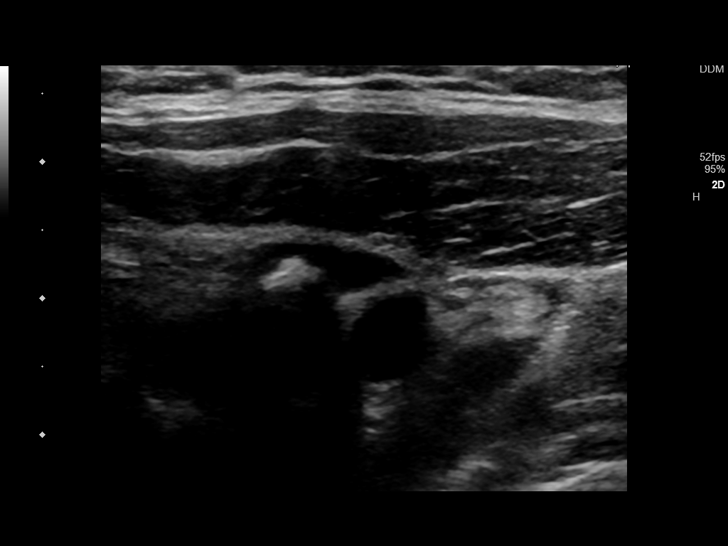
[im 33/75]
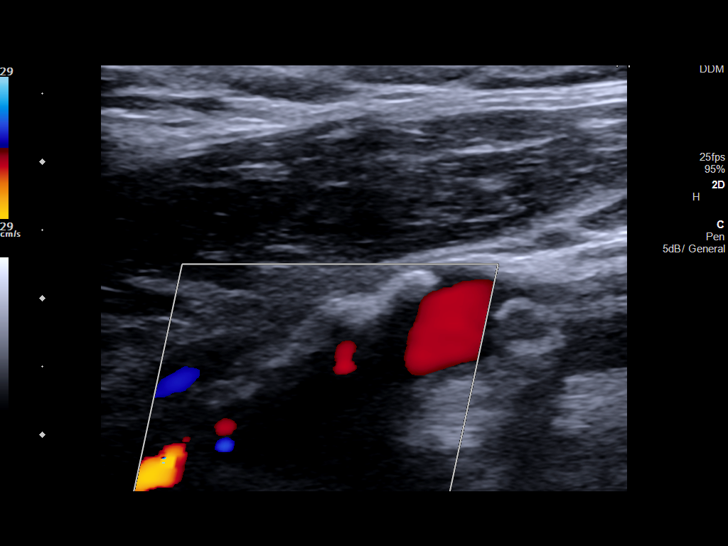
[im 39/75]
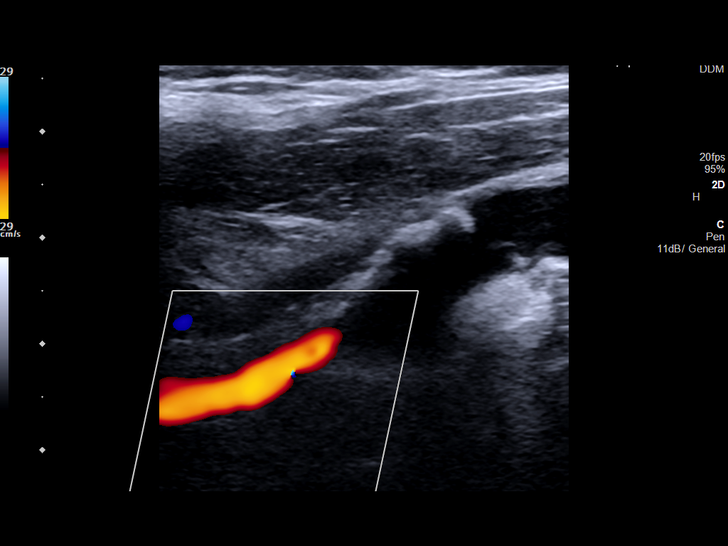
[im 42/75]
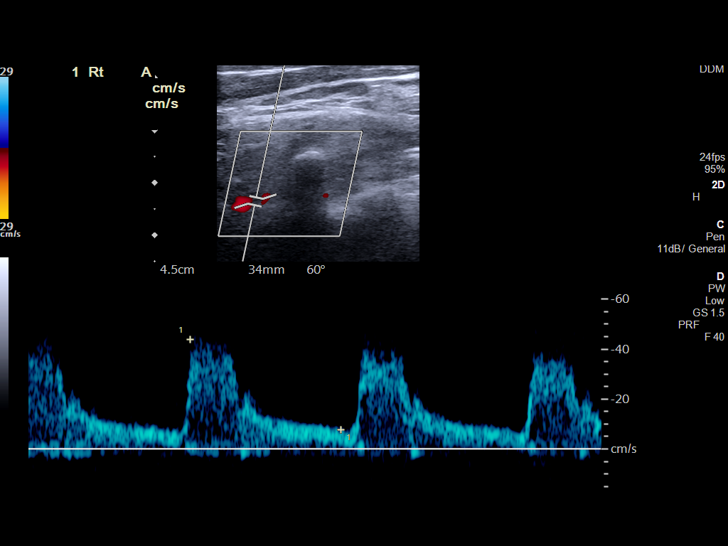
[im 49/75]
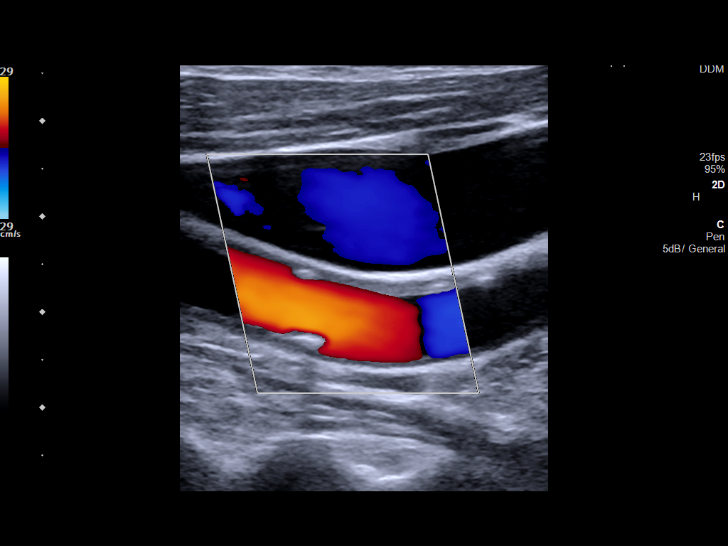
[im 55/75]
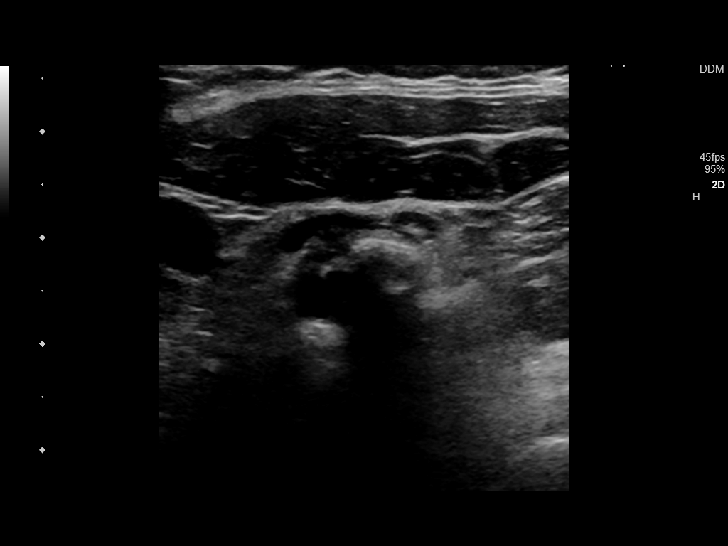
[im 62/75]
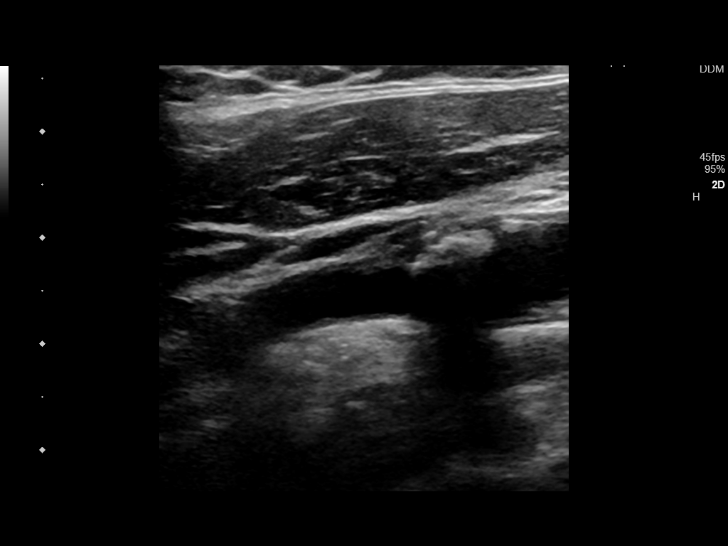
[im 68/75]
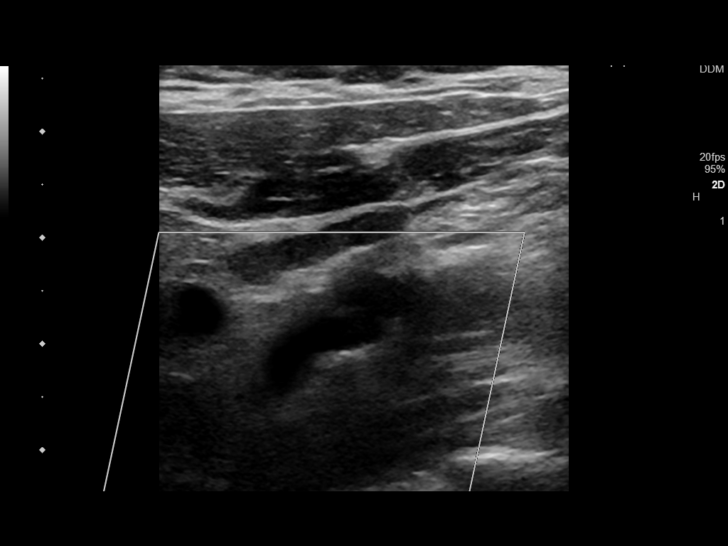
[im 75/75]
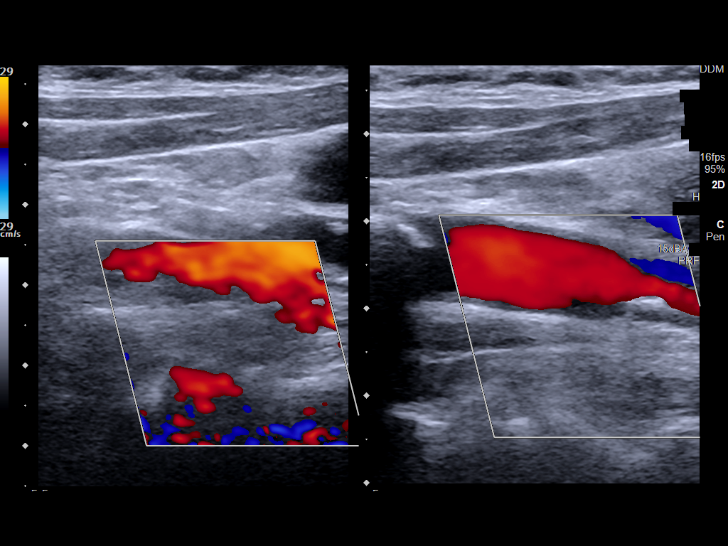

[13 of 24 positions shown; findings below may reference images not displayed]

FINDINGS: Criteria: Quantification of carotid stenosis is based on velocity
parameters that correlate the residual internal carotid diameter
with NASCET-based stenosis levels, using the diameter of the distal
internal carotid lumen as the denominator for stenosis measurement.

The following velocity measurements were obtained:

RIGHT

ICA: 95/9 cm/sec

CCA: 52/5 cm/sec

SYSTOLIC ICA/CCA RATIO:

ECA: 73 cm/sec

LEFT

ICA: 89/8 cm/sec

CCA: 64/7 cm/sec

SYSTOLIC ICA/CCA RATIO:

ECA: 70 cm/sec

RIGHT CAROTID ARTERY: There is a minimal amount of eccentric
echogenic plaque involving the distal aspect of the right common
carotid artery (image 16). There is a moderate amount of eccentric
echogenic plaque within the right carotid bulb (image 24). There is
a large amount of eccentric echogenic shadowing plaque involving the
origin and proximal aspects of the right internal carotid artery
(image 33), progressed compared to the [DATE] examination, though
not resulting in elevated peak systolic velocities within the
interrogated course of the right internal carotid artery to suggest
a hemodynamically significant stenosis.

RIGHT VERTEBRAL ARTERY:  Antegrade flow

LEFT CAROTID ARTERY: There is moderate amount of eccentric echogenic
plaque involving the proximal (image 45), mid (image 49) and distal
(image 53) aspects of the left common carotid artery. There is a
large amount of echogenic densely shadowing plaque within the left
carotid bulb (image 58), extending to involve the origin and
proximal aspects of the left internal carotid artery (image 66,
progressed compared to the [DATE] examination, though not resulting
in elevated peak systolic velocities within the interrogated course
of the left internal carotid artery to suggest a hemodynamically
significant stenosis.

LEFT VERTEBRAL ARTERY:  Antegrade Flow
IMPRESSION: Large amount of bilateral atherosclerotic plaque, progressed
compared to the [DATE] examination, though not definitely resulting
in a hemodynamically significant stenosis within either internal
carotid artery. If clinical concern persists, further evaluation
could be performed with CTA as clinically indicated.

## 2019-10-19 MED ORDER — HYDRALAZINE HCL 50 MG PO TABS
25.0000 mg | ORAL_TABLET | Freq: Four times a day (QID) | ORAL | Status: DC | PRN
  Administered 2019-10-20: 08:00:00 25 mg via ORAL
  Filled 2019-10-19: qty 1

## 2019-10-19 MED ORDER — INSULIN ASPART 100 UNIT/ML ~~LOC~~ SOLN
0.0000 [IU] | Freq: Three times a day (TID) | SUBCUTANEOUS | Status: DC
  Administered 2019-10-19: 22:00:00 2 [IU] via SUBCUTANEOUS
  Administered 2019-10-19 (×2): 5 [IU] via SUBCUTANEOUS
  Administered 2019-10-20: 14:00:00 7 [IU] via SUBCUTANEOUS
  Administered 2019-10-20: 08:00:00 2 [IU] via SUBCUTANEOUS
  Filled 2019-10-19 (×4): qty 1

## 2019-10-19 MED ORDER — INSULIN ASPART PROT & ASPART (70-30 MIX) 100 UNIT/ML ~~LOC~~ SUSP
30.0000 [IU] | Freq: Every day | SUBCUTANEOUS | Status: DC
  Administered 2019-10-20: 12:00:00 30 [IU] via SUBCUTANEOUS
  Filled 2019-10-19: qty 10

## 2019-10-19 MED ORDER — CLOPIDOGREL BISULFATE 75 MG PO TABS
75.0000 mg | ORAL_TABLET | Freq: Every day | ORAL | Status: DC
  Administered 2019-10-19 – 2019-10-20 (×2): 75 mg via ORAL
  Filled 2019-10-19 (×2): qty 1

## 2019-10-19 NOTE — Progress Notes (Signed)
SLP Cancellation Note  Patient Details Name: Victor Fox MRN: [id number] DOB: [birth year]   Cancelled treatment:       Reason Eval/Treat Not Completed: SLP screened, no needs identified, will sign off(chart reviewed; consulted NSG, met w/ pt). Pt denied any difficulty swallowing and is currently on a regular diet; tolerates swallowing pills w/ water per NSG. He was finishing his spaghetti lunch meal during this screening. Pt conversed in conversation w/ SLP w/out Overt deficits noted; pt denied any speech-language deficits. He answered a few general y/n questions and was able to recall some of the event that brought him in.  No further skilled Acute ST services indicated as pt appears near/at his baseline as per Neurology note. If pt feels any changes once home, then recommended f/u w/ PCP for referral to Neurology or skilled ST services for a more formal evaluation of Cognitive-linguistic skills. Pt agreed. NSG to reconsult if any change in status.     Orinda Kenner, MS, CCC-SLP Jorgina Binning 10/19/2019, 1:58 PM

## 2019-10-19 NOTE — Progress Notes (Signed)
*  PRELIMINARY RESULTS* Echocardiogram 2D Echocardiogram has been performed.  Victor Fox 10/19/2019, 11:08 AM

## 2019-10-19 NOTE — Evaluation (Signed)
Occupational Therapy Evaluation Patient Details Name: Victor Fox MRN: [id number] DOB: 1946-06-16 Today's Date: 10/19/2019    History of Present Illness Pt is 74 yo male that presented to ED for AMS, confusion, staggering gait. Imaging negative for acute findings. PMH includes CVA, DM, heart murmur, HLD, HTN, neuropathy, tobacco use, previous lumbar surgeries.   Clinical Impression   Pt was seen for OT evaluation this date. Prior to hospital admission, pt was Indep with all ADLs/IADLs. Pt lives with a roommate in a [facility] with 6 STE. Currently pt demonstrates impairments as described below (See OT problem list) which functionally limit his ability to perform ADL/self-care tasks. Pt currently requires no assistance with ADLs/ADL mobility. Does require some increased time for safety, but otherwise completes tasks with no assistance from OT. Appears to demo some impulsivity, but overall good safety awareness.  Do not anticipate further acute OT needs at this time, and do not anticipate need for f/u OT outside of this hospital stay.    Follow Up Recommendations  No OT follow up    Equipment Recommendations  None recommended by OT    Recommendations for Other Services       Precautions / Restrictions Precautions Precautions: Fall Restrictions Weight Bearing Restrictions: No      Mobility Bed Mobility Overal bed mobility: Independent Bed Mobility: Supine to Sit;Sit to Supine              Transfers Overall transfer level: Independent               General transfer comment: no AD, good balance    Balance Overall balance assessment: Modified Independent   Sitting balance-Leahy Scale: Normal       Standing balance-Leahy Scale: Normal           Rhomberg - Eyes Opened: 20 Rhomberg - Eyes Closed: 20 High level balance activites: Turns High Level Balance Comments: pt able to perform one step forward bilaterally, x20seconds no LOB noted           ADL either  performed or assessed with clinical judgement   ADL Overall ADL's : At baseline                                       General ADL Comments: Pt able to perform UB ADLs with no assistance, requires extended time for seated LB ADLs d/t decreased ROM 2/2 hx back sx. Pt performs sit<>stand with MOD I with extended time, no AD.     Vision Patient Visual Report: No change from baseline Additional Comments: assessed tracking and peripheral vision, pt appropriate in all quadrants, reports no vision changes.     Perception     Praxis      Pertinent Vitals/Pain Pain Assessment: No/denies pain     Hand Dominance Right   Extremity/Trunk Assessment Upper Extremity Assessment Upper Extremity Assessment: Overall WFL for tasks assessed   Lower Extremity Assessment Lower Extremity Assessment: Defer to PT evaluation;Overall [facility] for tasks assessed   Cervical / Trunk Assessment Cervical / Trunk Assessment: Normal   Communication Communication Communication: No difficulties   Cognition Arousal/Alertness: Awake/alert Behavior During Therapy: WFL for tasks assessed/performed Overall Cognitive Status: Within Functional Limits for tasks assessed                                 General Comments:  A&O x4   General Comments       Exercises Other Exercises Other Exercises: OT facilitates education re: role of OT, pt verbalized understanding.   Shoulder Instructions      Home Living Family/patient expects to be discharged to:: Private residence Living Arrangements: Other (Comment)(lives with "a guy") Available Help at Discharge: Friend(s);Available PRN/intermittently Type of Home: Mobile home Home Access: Stairs to enter Entrance Stairs-Number of Steps: 6 Entrance Stairs-Rails: Right;Left;Can reach both Home Layout: One level               Home Equipment: Cane - single point;Crutches;Walker - 2 wheels;Walker - standard          Prior  Functioning/Environment Level of Independence: Independent with assistive device(s)        Comments: uses SPC as needed. At the grocery store uses the "buggy". Drives. The man he lives with does the cooking.        OT Problem List: Decreased activity tolerance      OT Treatment/Interventions:      OT Goals(Current goals can be found in the care plan section) Acute Rehab OT Goals Patient Stated Goal: to figure out what's going on with my head OT Goal Formulation: All assessment and education complete, DC therapy  OT Frequency:     Barriers to D/C:            Co-evaluation              AM-PAC OT "6 Clicks" Daily Activity     Outcome Measure Help from another person eating meals?: None Help from another person taking care of personal grooming?: None Help from another person toileting, which includes using toliet, bedpan, or urinal?: None Help from another person bathing (including washing, rinsing, drying)?: None Help from another person to put on and taking off regular upper body clothing?: None Help from another person to put on and taking off regular lower body clothing?: None 6 Click Score: 24   End of Session Equipment Utilized During Treatment: Gait belt Nurse Communication: Mobility status  Activity Tolerance: Patient tolerated treatment well Patient left: in bed;with call bell/phone within reach  OT Visit Diagnosis: Other abnormalities of gait and mobility (R26.89)                Time: [id number] OT Time Calculation (min): 40 min Charges:  OT General Charges $OT Visit: 1 Visit OT Evaluation $OT Eval Low Complexity: 1 Low OT Treatments $Self Care/Home Management : 8-22 mins $Therapeutic Activity: 8-22 mins  Gerrianne Scale, MS, OTR/L ascom [phone] 10/19/19, 2:51 PM

## 2019-10-19 NOTE — Progress Notes (Signed)
eeg completed ° °

## 2019-10-19 NOTE — Evaluation (Signed)
Physical Therapy Evaluation Patient Details Name: Victor Fox MRN: [id number] DOB: October 03, 1945 Today's Date: 10/19/2019   History of Present Illness  Pt is 74 yo male that presented to ED for AMS, confusion, staggering gait. Imaging negative for acute findings. PMH includes CVA, DM, heart murmur, HLD, HTN, neuropathy, tobacco use, previous lumbar surgeries.    Clinical Impression  Pt alert, oriented, able to answer all situational questions appropriately. Cognition status assessed, indicative of higher level processing intact. Pt demonstrated bed mobility, transfers, and ambulation independently. Higher level balance assessed without LOB. The patient demonstrated and reported return to baseline level of functioning, no further acute PT needs indicated. PT to sign off. Please reconsult PT if pt status changes or acute needs are identified.      Follow Up Recommendations No PT follow up    Equipment Recommendations  None recommended by PT    Recommendations for Other Services       Precautions / Restrictions Precautions Precautions: Fall Restrictions Weight Bearing Restrictions: No      Mobility  Bed Mobility Overal bed mobility: Independent Bed Mobility: Supine to Sit;Sit to Supine              Transfers Overall transfer level: Independent                  Ambulation/Gait Ambulation/Gait assistance: Independent Gait Distance (Feet): 40 Feet         General Gait Details: ambulation in room, pt did not don a mask for hallway ambulation  Stairs            Wheelchair Mobility    Modified Rankin (Stroke Patients Only)       Balance Overall balance assessment: Modified Independent   Sitting balance-Leahy Scale: Normal       Standing balance-Leahy Scale: Normal           Rhomberg - Eyes Opened: 20 Rhomberg - Eyes Closed: 20 High level balance activites: Turns High Level Balance Comments: pt able to perform one step forward bilaterally,  x20seconds no LOB noted             Pertinent Vitals/Pain Pain Assessment: No/denies pain    Home Living Family/patient expects to be discharged to:: Private residence Living Arrangements: Other (Comment)("a guy") Available Help at Discharge: Friend(s);Available PRN/intermittently Type of Home: Mobile home Home Access: Stairs to enter Entrance Stairs-Rails: Right;Left;Can reach both Entrance Stairs-Number of Steps: 6 Home Layout: One level Home Equipment: Cane - single point;Crutches;Walker - 2 wheels;Walker - standard      Prior Function Level of Independence: Independent with assistive device(s)         Comments: uses SPC as needed. At the grocery store uses the "buggy". Drives. The man he lives with does the cooking.     Hand Dominance   Dominant Hand: Right    Extremity/Trunk Assessment   Upper Extremity Assessment Upper Extremity Assessment: Overall WFL for tasks assessed    Lower Extremity Assessment Lower Extremity Assessment: Overall WFL for tasks assessed(grossly 4/5 bilaterally)    Cervical / Trunk Assessment Cervical / Trunk Assessment: Normal  Communication   Communication: No difficulties  Cognition Arousal/Alertness: Awake/alert Behavior During Therapy: WFL for tasks assessed/performed Overall Cognitive Status: Within Functional Limits for tasks assessed                                        General Comments  Exercises Other Exercises Other Exercises: cognition also assessed; pt clock drawing WFLs, line bisection WFLs, able to count by 5's to 30, and able to describe an ADL step by step indicating higher cognitive function intact.   Assessment/Plan    PT Assessment Patent does not need any further PT services  PT Problem List         PT Treatment Interventions      PT Goals (Current goals can be found in the Care Plan section)       Frequency     Barriers to discharge        Co-evaluation                AM-PAC PT "6 Clicks" Mobility  Outcome Measure Help needed turning from your back to your side while in a flat bed without using bedrails?: None Help needed moving from lying on your back to sitting on the side of a flat bed without using bedrails?: None Help needed moving to and from a bed to a chair (including a wheelchair)?: None Help needed standing up from a chair using your arms (e.g., wheelchair or bedside chair)?: None Help needed to walk in hospital room?: None Help needed climbing 3-5 steps with a railing? : None 6 Click Score: 24    End of Session Equipment Utilized During Treatment: Gait belt Activity Tolerance: Patient tolerated treatment well Patient left: in bed;with call bell/phone within reach(sitting EOB for lunch) Nurse Communication: Mobility status PT Visit Diagnosis: Other abnormalities of gait and mobility (R26.89)    Time: [id number] PT Time Calculation (min) (ACUTE ONLY): 28 min   Charges:   PT Evaluation $PT Eval Low Complexity: 1 Low PT Treatments $Therapeutic Exercise: 8-22 mins      Lieutenant Diego PT, DPT 2:29 PM,10/19/19

## 2019-10-19 NOTE — Procedures (Signed)
ELECTROENCEPHALOGRAM REPORT   Patient: Victor Fox       Room #: [id number] EEG No. ID: [id number] Age: 74 y.o.        Sex: male Requesting Physician: Arbutus Ped Report Date:  10/19/2019        Interpreting Physician: Alexis Goodell  History: LADERRICK PINNOW is an 74 y.o. male with episode of confusion  Medications:  Norvasc, ASA, Lipitor, Plavix, Vasotec, Insulin  Conditions of Recording:  This is a 21 channel routine scalp EEG performed with bipolar and monopolar montages arranged in accordance to the international 10/20 system of electrode placement. One channel was dedicated to EKG recording.  The patient is in the awake, drowsy and asleep states.  Description:  The waking background activity consists of a low voltage, symmetrical, fairly well organized, 7 Hz theta activity, seen from the parieto-occipital and posterior temporal regions.  Low voltage fast activity, poorly organized, is seen anteriorly and is at times superimposed on more posterior regions.  A mixture of theta and alpha rhythms are seen from the central and temporal regions. The patient drowses with slowing to irregular, low voltage theta and beta activity.   The patient goes in to a light sleep with symmetrical sleep spindles, vertex central sharp transients and irregular slow activity.  No epileptiform activity is noted.   Hyperventilation was not performed.  Intermittent photic stimulation was performed but failed to illicit any change in the tracing.    IMPRESSION: This is an abnormal EEG secondary to mild posterior background slowing.  This finding may be seen with a diffuse gray matter disturbance that is etiologically nonspecific, but may include a dementia, among other possibilities.  No epileptiform activity is noted.     Alexis Goodell, MD Neurology [phone] 10/19/2019, 4:19 PM

## 2019-10-19 NOTE — ED Notes (Signed)
MD made aware that pt passed stroke swallow screen

## 2019-10-19 NOTE — ED Notes (Signed)
Pt reporting bilateral hand numbness but no decrease in sensation upon exam. Bilateral radial pulses intact and moderate. Color and temp of both hands is appropriate. Cap refill is <3 seconds.

## 2019-10-19 NOTE — Progress Notes (Addendum)
PROGRESS NOTE    Victor Fox  [id number] DOB: 02-04-1946 DOA: 10/18/2019  PCP: Patient, No Pcp Per    LOS - 0   Brief Narrative:  74 year old male with history of hypertension, hyperlipidemia, prior CVA who presented to the ED on the evening of 2/16 with altered mental status, described as "not acting himself".  This is evidenced by driving car in the wrong year, abnormal speech, unsteady ambulation.  No focal weakness or paresthesias.  In the ED, blood pressure was 236/82, otherwise stable vitals.  Labs essentially unremarkable except for glucosuria on UA.  Lactic acid was 1.5 and repeat 1.8.  UDS was negative.  Alcohol level was less than 10.  Head CT was negative for acute findings.  Cervical spine CT without acute abnormalities.  Patient was treated for hypertension with IV labetalol admitted to hospitalist service for further evaluation and management.  Neurology consulted.  Subjective 2/17: Patient seen and examined at bedside this morning.  He reports feeling pretty good.  Denies headache or vision changes.  Denies weakness in his arms or legs.  States no issues with speaking so far this morning.  Passed swallow evaluation.  No acute events reported he denies fevers or chills, chest pain or shortness of breath, nausea vomiting diarrhea or other acute complaints.  Assessment & Plan:   Principal Problem:   Acute metabolic encephalopathy Active Problems:   Hypertensive urgency   Acute encephalopathy -etiology to be determined, but could be hypertensive encephalopathy given his severely uncontrolled blood pressure upon arrival.  Symptoms seem to have improved with control of blood pressure as well.  TIA/CVA and seizure in the differential, patient with history of CVA.  Nonfocal neuro exam.  MRI brain negative for acute findings.  Did show chronic small vessel ischemic changes and chronic microhemorrhages. --Neurology consulted, appreciate recs --Continue aspirin --Follow-up echo  and carotid Dopplers --EEG pending --Blood pressure control as below --PT evaluation --Fall precautions  Hypertensive urgency -improved.  BP 236/82 upon arrival.  This AM 162/72. Essential hypertension -uncontrolled --Continue home amlodipine, enalapril --Oral hydralazine as needed  Type 2 diabetes -A1c 9.6% --Sliding scale NovoLog --Home basal insulin: Novolin 70/30, 30 units daily --Hold Metformin  Hyperlipidemia -continue home statin  DVT prophylaxis: Lovenox   Code Status: Full Code  Family Communication: None at bedside  Disposition Plan: Pending PT evaluation and clearance by neurology.  Expect discharge to previous home environment tomorrow 2/18, pending improvement in mentation and mobility Coming From home Exp DC Date 2/18 Barriers as above Medically Stable for Discharge?  No   Severity of Illness: The appropriate patient status for this patient is INPATIENT. It is not anticipated that the patient will be medically stable for discharge from the hospital within 2 midnights of admission. The following factors support the patient status of inpatient.  "           Presenting symptoms include  altered mental status, abnormal speech, unsteady gait. "           Worrisome physical exam findings include severe hypertension.. "           Chronic co-morbidities include  prior history of stroke, uncontrolled hypertension, type 2 diabetes uncontrolled, hyperlipidemia.   * I certify that at the point of admission it is my clinical judgment that the patient will require inpatient hospital care spanning beyond 2 midnights from the point of admission due to high intensity of service, high risk for further deterioration and high frequency of surveillance required.*  Consultants:   Neurology, Dr. Doy Mince  Procedures:   Echo, pending  EEG, pending  Carotid Dopplers, pending  Antimicrobials:   None   Objective: Vitals:   10/19/19 0300 10/19/19 0400 10/19/19 0653  10/19/19 0800  BP:   (!) 199/77 (!) 162/72  Pulse:  67  61  Resp: 15 16  18   Temp:    98.8 F (37.1 C)  TempSrc:    Oral  SpO2:  96%  95%  Weight:      Height:        Intake/Output Summary (Last 24 hours) at 10/19/2019 1647 Last data filed at 10/19/2019 0800 Gross per 24 hour  Intake 227.63 ml  Output 250 ml  Net -22.37 ml   Filed Weights   10/18/19 2101  Weight: 90.7 kg    Examination:  General exam: awake, alert, no acute distress HEENT: moist mucus membranes, hearing grossly normal  Respiratory system: CTAB, no wheezes, rales or rhonchi, normal respiratory effort. Cardiovascular system: normal S1/S2, RRR, no JVD, murmurs, rubs, gallops, no pedal edema.   Gastrointestinal system: soft, NT, ND, no HSM felt, +bowel sounds. Central nervous system: A&O x3. no gross focal neurologic deficits, normal speech Extremities: moves all, no edema, normal tone Skin: dry, intact, normal temperature, face flushed Psychiatry: normal mood, congruent affect, judgement and insight appear normal    Data Reviewed: I have personally reviewed following labs and imaging studies  CBC: Recent Labs  Lab 10/18/19 2106  WBC 8.8  NEUTROABS 4.7  HGB 15.4  HCT 45.0  MCV 84.9  PLT 0000000   Basic Metabolic Panel: Recent Labs  Lab 10/18/19 2106  NA 138  K 4.1  CL 101  CO2 28  GLUCOSE 240*  BUN 12  CREATININE 0.97  CALCIUM 9.4   GFR: Estimated Creatinine Clearance: 75.6 mL/min (by C-G formula based on SCr of 0.97 mg/dL). Liver Function Tests: Recent Labs  Lab 10/18/19 2106  AST 17  ALT 15  ALKPHOS 74  BILITOT 0.7  PROT 6.7  ALBUMIN 3.7   No results for input(s): LIPASE, AMYLASE in the last 168 hours. No results for input(s): AMMONIA in the last 168 hours. Coagulation Profile: No results for input(s): INR, PROTIME in the last 168 hours. Cardiac Enzymes: No results for input(s): CKTOTAL, CKMB, CKMBINDEX, TROPONINI in the last 168 hours. BNP (last 3 results) No results for  input(s): PROBNP in the last 8760 hours. HbA1C: Recent Labs    10/19/19 0640  HGBA1C 9.6*   CBG: No results for input(s): GLUCAP in the last 168 hours. Lipid Profile: Recent Labs    10/19/19 0640  CHOL 185  HDL 36*  LDLCALC 116*  TRIG 164*  CHOLHDL 5.1   Thyroid Function Tests: No results for input(s): TSH, T4TOTAL, FREET4, T3FREE, THYROIDAB in the last 72 hours. Anemia Panel: No results for input(s): VITAMINB12, FOLATE, FERRITIN, TIBC, IRON, RETICCTPCT in the last 72 hours. Sepsis Labs: Recent Labs  Lab 10/18/19 2106 10/18/19 2248  LATICACIDVEN 1.5 1.8    Recent Results (from the past 240 hour(s))  Respiratory Panel by RT PCR (Flu A&B, Covid) - Nasopharyngeal Swab     Status: None   Collection Time: 10/18/19  9:06 PM   Specimen: Nasopharyngeal Swab  Result Value Ref Range Status   SARS Coronavirus 2 by RT PCR NEGATIVE NEGATIVE Final    Comment: (NOTE) SARS-CoV-2 target nucleic acids are NOT DETECTED. The SARS-CoV-2 RNA is generally detectable in upper respiratoy specimens during the acute phase of infection. The lowest  concentration of SARS-CoV-2 viral copies this assay can detect is 131 copies/mL. A negative result does not preclude SARS-Cov-2 infection and should not be used as the sole basis for treatment or other patient management decisions. A negative result may occur with  improper specimen collection/handling, submission of specimen other than nasopharyngeal swab, presence of viral mutation(s) within the areas targeted by this assay, and inadequate number of viral copies (<131 copies/mL). A negative result must be combined with clinical observations, patient history, and epidemiological information. The expected result is Negative. Fact Sheet for Patients:  PinkCheek.be Fact Sheet for Healthcare Providers:  GravelBags.it This test is not yet ap proved or cleared by the [place] FDA and  has  been authorized for detection and/or diagnosis of SARS-CoV-2 by FDA under an Emergency Use Authorization (EUA). This EUA will remain  in effect (meaning this test can be used) for the duration of the COVID-19 declaration under Section 564(b)(1) of the Act, 21 U.S.C. section 360bbb-3(b)(1), unless the authorization is terminated or revoked sooner.    Influenza A by PCR NEGATIVE NEGATIVE Final   Influenza B by PCR NEGATIVE NEGATIVE Final    Comment: (NOTE) The Xpert Xpress SARS-CoV-2/FLU/RSV assay is intended as an aid in  the diagnosis of influenza from Nasopharyngeal swab specimens and  should not be used as a sole basis for treatment. Nasal washings and  aspirates are unacceptable for Xpert Xpress SARS-CoV-2/FLU/RSV  testing. Fact Sheet for Patients: PinkCheek.be Fact Sheet for Healthcare Providers: GravelBags.it This test is not yet approved or cleared by the [place] FDA and  has been authorized for detection and/or diagnosis of SARS-CoV-2 by  FDA under an Emergency Use Authorization (EUA). This EUA will remain  in effect (meaning this test can be used) for the duration of the  Covid-19 declaration under Section 564(b)(1) of the Act, 21  U.S.C. section 360bbb-3(b)(1), unless the authorization is  terminated or revoked. Performed at [facility], [address] [place], [place] [address]          Radiology Studies: EEG  Result Date: 10/19/2019 Alexis Goodell, MD     10/19/2019  4:21 PM ELECTROENCEPHALOGRAM REPORT Patient: ABBOT BALLOG       Room #: [id number] EEG No. ID: [id number] Age: 74 y.o.        Sex: male Requesting Physician: Arbutus Ped Report Date:  10/19/2019       Interpreting Physician: Alexis Goodell History: TREMEL FOLKERTS is an 74 y.o. male with episode of confusion Medications: Norvasc, ASA, Lipitor, Plavix, Vasotec, Insulin Conditions of Recording:  This is a 21 channel routine scalp EEG  performed with bipolar and monopolar montages arranged in accordance to the international 10/20 system of electrode placement. One channel was dedicated to EKG recording. The patient is in the awake, drowsy and asleep states. Description:  The waking background activity consists of a low voltage, symmetrical, fairly well organized, 7 Hz theta activity, seen from the parieto-occipital and posterior temporal regions.  Low voltage fast activity, poorly organized, is seen anteriorly and is at times superimposed on more posterior regions.  A mixture of theta and alpha rhythms are seen from the central and temporal regions. The patient drowses with slowing to irregular, low voltage theta and beta activity.  The patient goes in to a light sleep with symmetrical sleep spindles, vertex central sharp transients and irregular slow activity. No epileptiform activity is noted.  Hyperventilation was not performed.  Intermittent photic stimulation was performed but failed to illicit any  change in the tracing. IMPRESSION: This is an abnormal EEG secondary to mild posterior background slowing.  This finding may be seen with a diffuse gray matter disturbance that is etiologically nonspecific, but may include a dementia, among other possibilities.  No epileptiform activity is noted.  Alexis Goodell, MD Neurology [phone] 10/19/2019, 4:19 PM   CT Head Wo Contrast  Result Date: 10/18/2019 CLINICAL DATA:  74 year old male with altered mental status, confusion, ataxia. EXAM: CT HEAD WITHOUT CONTRAST TECHNIQUE: Contiguous axial images were obtained from the base of the skull through the vertex without intravenous contrast. COMPARISON:  Head CT 05/02/2013. FINDINGS: Brain: Cerebral volume is not significantly changed since 2014. No midline shift, mass effect, or evidence of intracranial mass lesion. No ventriculomegaly. Chronic Patchy and confluent bilateral white matter hypodensity has not significantly changed. Chronic deep gray  matter nuclei heterogeneity appears stable. No cortically based acute infarct identified. No definite cortical encephalomalacia. No acute intracranial hemorrhage identified. Vascular: Calcified atherosclerosis at the skull base. No suspicious intracranial vascular hyperdensity. Skull: Stable.  No acute osseous abnormality identified. Sinuses/Orbits: Visualized paranasal sinuses and mastoids are clear. Other: No acute orbit or scalp soft tissue finding. IMPRESSION: No acute intracranial abnormality. Chronic small-vessel disease suspected but not significantly changed by CT since 2014. Electronically Signed   By: Genevie Ann M.D.   On: 10/18/2019 21:30   CT Cervical Spine Wo Contrast  Result Date: 10/18/2019 CLINICAL DATA:  74 year old male with altered mental status, confusion, ataxia. EXAM: CT CERVICAL SPINE WITHOUT CONTRAST TECHNIQUE: Multidetector CT imaging of the cervical spine was performed without intravenous contrast. Multiplanar CT image reconstructions were also generated. COMPARISON:  Cervical spine radiographs 06/26/2014. chest CT 09/01/2018 FINDINGS: Alignment: Stable chronic straightening of cervical lordosis. Cervicothoracic junction alignment is within normal limits. Bilateral posterior element alignment is within normal limits. Skull base and vertebrae: Visualized skull base is intact. No atlanto-occipital dissociation. No acute osseous abnormality identified. Chronic interbody arthrodesis or ankylosis at C5-C6. Soft tissues and spinal canal: No prevertebral fluid or swelling. No visible canal hematoma. Calcified atherosclerosis in the neck and at the skull base. Otherwise negative noncontrast neck soft tissues. Disc levels: Moderate right occipital condyle-C1 degeneration with joint space loss, subchondral sclerosis and small subchondral cysts. Multilevel upper cervical facet arthropathy. Lower cervical chronic disc and endplate degeneration. Upper chest: Chronic left apical lung scarring appears  stable from the 09/01/2018 CT. Visible upper thoracic levels appear intact. IMPRESSION: 1. No acute osseous abnormality in the cervical spine. 2. Advanced skull base and cervical spine degeneration. Chronic C5-C6 interbody ankylosis. 3. Left apical lung scarring appears stable from the 09/01/2018 Chest CT. Electronically Signed   By: Genevie Ann M.D.   On: 10/18/2019 21:35   MR Brain W and Wo Contrast  Result Date: 10/18/2019 CLINICAL DATA:  74 year old male with altered mental status, confusion, ataxia. EXAM: MRI HEAD WITHOUT AND WITH CONTRAST TECHNIQUE: Multiplanar, multiecho pulse sequences of the brain and surrounding structures were obtained without and with intravenous contrast. CONTRAST:  55mL GADAVIST GADOBUTROL 1 MMOL/ML IV SOLN COMPARISON:  Head and cervical spine CT earlier today. FINDINGS: Brain: No restricted diffusion to suggest acute infarction. No midline shift, mass effect, evidence of mass lesion, ventriculomegaly, extra-axial collection or acute intracranial hemorrhage. Cervicomedullary junction and pituitary are within normal limits. Widespread and confluent bilateral cerebral white matter T2 and FLAIR hyperintensity. Similar extensive T2 and FLAIR heterogeneity throughout the bilateral deep gray nuclei. Evidence of chronic lacunar infarcts in the lateral left pons and left cerebellum. Scattered chronic microhemorrhages  in the bilateral thalami, right temporal and occipital lobes, left cerebellum. No definite cortical encephalomalacia. No abnormal enhancement identified. No dural thickening. Vascular: Major intracranial vascular flow voids are preserved. Skull and upper cervical spine: Negative visible cervical spine. Visualized bone marrow signal is within normal limits. Sinuses/Orbits: Negative orbits. Paranasal sinuses are clear. Other: Mastoids are well pneumatized. Grossly normal visible internal auditory structures. Scalp and face soft tissues appear negative. IMPRESSION: 1. Evidence of  chronically advanced small vessel disease but no acute intracranial abnormality is identified. 2. Chronic microhemorrhages scattered in the deep gray nuclei, right hemisphere, left cerebellum. Electronically Signed   By: Genevie Ann M.D.   On: 10/18/2019 23:54   US Carotid Bilateral (at [facility] and [facility] only)  Result Date: 10/19/2019 CLINICAL DATA:  TIA. History of stroke, hyperlipidemia and diabetes. EXAM: BILATERAL CAROTID DUPLEX ULTRASOUND TECHNIQUE: Pearline Cables scale imaging, color Doppler and duplex ultrasound were performed of bilateral carotid and vertebral arteries in the neck. COMPARISON:  06/29/2012 FINDINGS: Criteria: Quantification of carotid stenosis is based on velocity parameters that correlate the residual internal carotid diameter with NASCET-based stenosis levels, using the diameter of the distal internal carotid lumen as the denominator for stenosis measurement. The following velocity measurements were obtained: RIGHT ICA: 95/9 cm/sec CCA: 0000000 cm/sec SYSTOLIC ICA/CCA RATIO:  1.8 ECA: 73 cm/sec LEFT ICA: 89/8 cm/sec CCA: [id number] cm/sec SYSTOLIC ICA/CCA RATIO:  1.4 ECA: 70 cm/sec RIGHT CAROTID ARTERY: There is a minimal amount of eccentric echogenic plaque involving the distal aspect of the right common carotid artery (image 16). There is a moderate amount of eccentric echogenic plaque within the right carotid bulb (image 24). There is a large amount of eccentric echogenic shadowing plaque involving the origin and proximal aspects of the right internal carotid artery (image 33), progressed compared to the 06/2012 examination, though not resulting in elevated peak systolic velocities within the interrogated course of the right internal carotid artery to suggest a hemodynamically significant stenosis. RIGHT VERTEBRAL ARTERY:  Antegrade flow LEFT CAROTID ARTERY: There is moderate amount of eccentric echogenic plaque involving the proximal (image 45), mid (image 49) and distal (image 53) aspects of the left common  carotid artery. There is a large amount of echogenic densely shadowing plaque within the left carotid bulb (image 58), extending to involve the origin and proximal aspects of the left internal carotid artery (image 66, progressed compared to the 06/2012 examination, though not resulting in elevated peak systolic velocities within the interrogated course of the left internal carotid artery to suggest a hemodynamically significant stenosis. LEFT VERTEBRAL ARTERY:  Antegrade Flow IMPRESSION: Large amount of bilateral atherosclerotic plaque, progressed compared to the 06/2012 examination, though not definitely resulting in a hemodynamically significant stenosis within either internal carotid artery. If clinical concern persists, further evaluation could be performed with CTA as clinically indicated. Electronically Signed   By: Sandi Mariscal M.D.   On: 10/19/2019 13:13   ECHOCARDIOGRAM COMPLETE  Result Date: 10/19/2019    ECHOCARDIOGRAM REPORT   Patient Name:   DEVAN ALLMARAS Date of Exam: 10/19/2019 Medical Rec #:  [id number]      Height:       72.0 in Accession #:    [id number]     Weight:       200.0 lb Date of Birth:  [birth year]       BSA:          2.13 m Patient Age:    85 years       BP:  162/72 mmHg Patient Gender: M              HR:           52 bpm. Exam Location:  [facility] Procedure: 2D Echo, Color Doppler and Cardiac Doppler Indications:     G45.9 TIA  History:         Patient has prior history of Echocardiogram examinations. CAD,                  Aortic Valve Disease; Risk Factors:Current Smoker,                  Hypertension, Diabetes and Dyslipidemia.  Sonographer:     Charmayne Sheer RDCS (AE) Referring Phys:  [id number] Arvella Merles MANSY Diagnosing Phys: Kate Sable MD  Sonographer Comments: No subcostal window. Image acquisition challenging due to uncooperative patient. IMPRESSIONS  1. Left ventricular ejection fraction, by estimation, is 60 to 65%. The left ventricle has normal function. The left ventricle  has no regional wall motion abnormalities. There is mild left ventricular hypertrophy. Left ventricular diastolic parameters were normal.  2. Right ventricular systolic function is normal. The right ventricular size is normal.  3. The mitral valve is degenerative. No evidence of mitral valve regurgitation.  4. The aortic valve is tricuspid. Aortic valve regurgitation is not visualized. Moderate aortic valve stenosis.  5. Pulmonic valve regurgitation not assessed. FINDINGS  Left Ventricle: Left ventricular ejection fraction, by estimation, is 60 to 65%. The left ventricle has normal function. The left ventricle has no regional wall motion abnormalities. The left ventricular internal cavity size was normal in size. There is  mild left ventricular hypertrophy. Left ventricular diastolic parameters were normal. Right Ventricle: The right ventricular size is normal. Right vetricular wall thickness was not assessed. Right ventricular systolic function is normal. Left Atrium: Left atrial size was normal in size. Right Atrium: Right atrial size was normal in size. Pericardium: There is no evidence of pericardial effusion. Mitral Valve: The mitral valve is degenerative in appearance. Mild mitral annular calcification. No evidence of mitral valve regurgitation. MV peak gradient, 4.1 mmHg. The mean mitral valve gradient is 2.0 mmHg. Tricuspid Valve: The tricuspid valve is normal in structure. Tricuspid valve regurgitation is not demonstrated. Aortic Valve: Aortic valve DVI is 0.34. The aortic valve is tricuspid. Aortic valve regurgitation is not visualized. Moderate aortic stenosis is present. There is severe calcifcation of the aortic valve. Aortic valve mean gradient measures 13.0 mmHg. Aortic valve peak gradient measures 26.4 mmHg. Aortic valve area, by VTI measures 1.4 cm Pulmonic Valve: The pulmonic valve was not well visualized. Pulmonic valve regurgitation not assessed. Aorta: The aortic root is normal in size and  structure. IAS/Shunts: No atrial level shunt detected by color flow Doppler.  LEFT VENTRICLE PLAX 2D LVIDd:         4.81 cm  Diastology LVIDs:         2.57 cm  LV e' lateral:   5.66 cm/s LV PW:         1.13 cm  LV E/e' lateral: 14.6 LV IVS:        1.04 cm  LV e' medial:    6.53 cm/s LVOT diam:     2.30 cm  LV E/e' medial:  12.7 LV SV:         76.86 ml LV SV Index:   38.94 LVOT Area:     4.15 cm  RIGHT VENTRICLE RV Basal diam:  3.58 cm LEFT ATRIUM  Index       RIGHT ATRIUM           Index LA diam:        3.70 cm 1.74 cm/m  RA Area:     16.30 cm LA Vol (A2C):   47.5 ml 22.29 ml/m RA Volume:   42.70 ml  20.04 ml/m LA Vol (A4C):   30.8 ml 14.46 ml/m LA Biplane Vol: 38.7 ml 18.16 ml/m  AORTIC VALVE AV Area (Vmax):    1.45 cm AV Area (Vmean):   1.84 cm AV Area (VTI):     1.81 cm AV Vmax:           257.00 cm/s AV Vmean:          134.000 cm/s AV VTI:            0.425 m AV Peak Grad:      26.4 mmHg AV Mean Grad:      13.0 mmHg LVOT Vmax:         89.40 cm/s LVOT Vmean:        59.300 cm/s LVOT VTI:          0.185 m LVOT/AV VTI ratio: 0.44  AORTA Ao Root diam: 3.50 cm MITRAL VALVE MV Area (PHT): 2.80 cm    SHUNTS MV Peak grad:  4.1 mmHg    Systemic VTI:  0.18 m MV Mean grad:  2.0 mmHg    Systemic Diam: 2.30 cm MV Vmax:       1.01 m/s MV Vmean:      60.4 cm/s MV Decel Time: 271 msec MV E velocity: 82.90 cm/s MV A velocity: 96.00 cm/s MV E/A ratio:  0.86 Kate Sable MD Electronically signed by Kate Sable MD Signature Date/Time: 10/19/2019/1:34:16 PM    Final         Scheduled Meds: .  stroke: mapping our early stages of recovery book   Does not apply Once  . amLODipine  10 mg Oral Daily  . aspirin EC  81 mg Oral Daily  . atorvastatin  40 mg Oral q1800  . clopidogrel  75 mg Oral Daily  . enalapril  20 mg Oral Daily  . enoxaparin (LOVENOX) injection  40 mg Subcutaneous Q24H  . insulin aspart  0-9 Units Subcutaneous TID PC & HS  . insulin aspart protamine- aspart  20 Units  Subcutaneous Daily   Continuous Infusions: . sodium chloride Stopped (10/19/19 0232)     LOS: 0 days    Time spent: 35 to 40 minutes    Ezekiel Slocumb, DO [facility]   If 7PM-7AM, please contact night-coverage [email or web address] 10/19/2019, 4:47 PM

## 2019-10-19 NOTE — Consult Note (Signed)
Reason for Consult:AMS Requesting Physician: Arbutus Ped  CC: AMS  I have been asked by Dr. Arbutus Ped to see this patient in consultation for AMS.  HPI: Victor Fox is an 74 y.o. male with a history of HTN, HLD, DM and stroke in the past who presented on yesterday with AMS.  Patient reports that he did not realize that he was confused.  Took a friend to work in the morning and when picking him up from work the friend noted that he was driving in the wrong gear, etc.  Patient was brought in for evaluation.  Patient unclear about when symptoms began and how long they lasted.  He feels he is at baseline at this time.  Reported some gait imbalance at presentation but on review of his records this has been a longstanding problem for him.  BP elevated on presentation.  Past Medical History:  Diagnosis Date  . Arthritis   . Benign neoplasm of unspecified site   . Carotid artery disease ([place])    a. carotid duplex 05/2015: less tahn 39% stenosis bilaterally, stable over the past year, recommend repeat imaging in 2 years (05/2017)  . Compression fracture    a. L2  . CVA (cerebral vascular accident) ([place]) 06/2012  . Diabetes mellitus without complication ([place])   . Heart murmur    a. echo 06/2012: EF >55%, LVH, aortic valve sclerosis  . Hyperlipidemia   . Hypertension   . Neuropathy   . Pneumonia   . Tobacco abuse    a. smoked x 25 years  . Vitamin D deficiency     Past Surgical History:  Procedure Laterality Date  . ARTHRODESIS    . BACK SURGERY     x 2  . discectomy    . trigger release      Family History  Problem Relation Age of Onset  . Heart disease Brother   . Hypertension Mother     Social History:  reports that he has quit smoking. His smoking use included cigarettes. He has a 10.00 pack-year smoking history. He has never used smokeless tobacco. He reports that he does not drink alcohol or use drugs.  No Known Allergies  Medications:  I have reviewed the patient's current  medications. Prior to Admission:  Prior to Admission medications   Medication Sig Start Date End Date Taking? Authorizing Provider  aspirin EC 81 MG tablet Take 81 mg by mouth daily.   Yes [provider]  atorvastatin (LIPITOR) 40 MG tablet Take 1 tablet (40 mg total) by mouth at bedtime. 12/24/18  Yes [name], [name], MD  enalapril (VASOTEC) 20 MG tablet Take 1 tablet (20 mg total) by mouth daily. 12/24/18  Yes [name], [name], MD  Insulin Isophane & Regular Human (NOVOLIN 70/30 FLEXPEN) (70-30) 100 UNIT/ML PEN Inject 30 Units into the skin daily. Pt takes daily at 12 noon prior to going to work   Yes [provider]  metFORMIN (GLUCOPHAGE-XR) 500 MG 24 hr tablet Take 1 tablet (500 mg total) by mouth 2 (two) times daily. 12/24/18  Yes [name], [name], MD  amLODipine (NORVASC) 10 MG tablet Take 1 tablet (10 mg total) by mouth daily. Appointment needed please for further refills Patient not taking: Reported on 10/19/2019 07/05/18   [name], MD  B-D UF III MINI PEN NEEDLES 31G X 5 MM MISC use once daily 01/28/17   Rochel Brome A, MD  blood glucose meter kit and supplies KIT Dispense based on  patient and insurance preference. Use up to four times daily as directed. (FOR ICD-9 250.00, 250.01). 11/23/18   Poulose, Bethel Born, NP  Lancets (ONETOUCH DELICA PLUS [id number]) MISC USE QID UTD 11/23/18   [provider]  meclizine (ANTIVERT) 25 MG tablet TAKE 1 TABLET(25 MG) BY MOUTH THREE TIMES DAILY AS NEEDED FOR DIZZINESS Patient not taking: Reported on 10/19/2019 09/06/18   [name], MD  ONE TOUCH ULTRA TEST test strip USE QID UTD 11/23/18   [provider]  Semaglutide,0.25 or 0.5MG/DOS, (OZEMPIC, 0.25 OR 0.5 MG/DOSE,) 2 MG/1.5ML SOPN Inject 0.5 mg into the skin once a week. Patient not taking: Reported on 10/19/2019 12/24/18   [name], MD     Scheduled: .  stroke: mapping our early stages of recovery book   Does not apply Once  . amLODipine  10 mg  Oral Daily  . aspirin EC  81 mg Oral Daily  . atorvastatin  40 mg Oral q1800  . clopidogrel  75 mg Oral Daily  . enalapril  20 mg Oral Daily  . enoxaparin (LOVENOX) injection  40 mg Subcutaneous Q24H  . insulin aspart  0-9 Units Subcutaneous TID PC & HS  . insulin aspart protamine- aspart  20 Units Subcutaneous Daily    ROS: History obtained from the patient  General ROS: negative for - chills, fatigue, fever, night sweats, weight gain or weight loss Psychological ROS: as noted in HPI Ophthalmic ROS: negative for - blurry vision, double vision, eye pain or loss of vision ENT ROS: negative for - epistaxis, nasal discharge, oral lesions, sore throat, tinnitus or vertigo Allergy and Immunology ROS: negative for - hives or itchy/watery eyes Hematological and Lymphatic ROS: negative for - bleeding problems, bruising or swollen lymph nodes Endocrine ROS: negative for - galactorrhea, hair pattern changes, polydipsia/polyuria or temperature intolerance Respiratory ROS: negative for - cough, hemoptysis, shortness of breath or wheezing Cardiovascular ROS: negative for - chest pain, dyspnea on exertion, edema or irregular heartbeat Gastrointestinal ROS: negative for - abdominal pain, diarrhea, hematemesis, nausea/vomiting or stool incontinence Genito-Urinary ROS: negative for - dysuria, hematuria, incontinence or urinary frequency/urgency Musculoskeletal ROS: negative for - joint swelling or muscular weakness Neurological ROS: as noted in HPI Dermatological ROS: negative for rash and skin lesion changes  Physical Examination: Blood pressure (!) 162/72, pulse 61, temperature 98.8 F (37.1 C), temperature source Oral, resp. rate 18, height 6' (1.829 m), weight 90.7 kg, SpO2 95 %.  HEENT-  Normocephalic, no lesions, without obvious abnormality.  Normal external eye and conjunctiva.  Normal TM's bilaterally.  Normal auditory canals and external ears. Normal external nose, mucus membranes and septum.   Normal pharynx. Cardiovascular- S1, S2 normal, pulses palpable throughout   Lungs- chest clear, no wheezing, rales, normal symmetric air entry Abdomen- soft, non-tender; bowel sounds normal; no masses,  no organomegaly Extremities- no edema Lymph-no adenopathy palpable Musculoskeletal-no joint tenderness, deformity or swelling Skin-warm and dry, no hyperpigmentation, vitiligo, or suspicious lesions  Neurological Examination   Mental Status: Alert, oriented to person and place.  Knew the President and month.  Struggled with the year.  Speech fluent without evidence of aphasia.  Able to follow 3 step commands without difficulty. Cranial Nerves: II: Discs flat bilaterally; Visual fields grossly normal, pupils equal, round, reactive to light and accommodation III,IV, VI: ptosis not present, extra-ocular motions intact bilaterally V,VII: smile symmetric, facial light touch sensation normal bilaterally VIII: hearing normal bilaterally IX,X: gag reflex present XI: bilateral shoulder shrug XII: midline tongue  extension Motor: Right : Upper extremity   5/5    Left:     Upper extremity   5/5  Lower extremity   5/5     Lower extremity   5/5 Tone and bulk:normal tone throughout; no atrophy noted Sensory: Pinprick and light touch intact throughout, bilaterally Deep Tendon Reflexes: Symmetric throughout Plantars: Right: downgoing   Left: downgoing Cerebellar: Normal finger-to-nose and normal heel-to-shin testing bilaterally Gait: not tested due to safety concerns   Laboratory Studies:   Basic Metabolic Panel: Recent Labs  Lab 10/18/19 2106  NA 138  K 4.1  CL 101  CO2 28  GLUCOSE 240*  BUN 12  CREATININE 0.97  CALCIUM 9.4    Liver Function Tests: Recent Labs  Lab 10/18/19 2106  AST 17  ALT 15  ALKPHOS 74  BILITOT 0.7  PROT 6.7  ALBUMIN 3.7   No results for input(s): LIPASE, AMYLASE in the last 168 hours. No results for input(s): AMMONIA in the last 168  hours.  CBC: Recent Labs  Lab 10/18/19 2106  WBC 8.8  NEUTROABS 4.7  HGB 15.4  HCT 45.0  MCV 84.9  PLT 195    Cardiac Enzymes: No results for input(s): CKTOTAL, CKMB, CKMBINDEX, TROPONINI in the last 168 hours.  BNP: Invalid input(s): POCBNP  CBG: No results for input(s): GLUCAP in the last 168 hours.  Microbiology: Results for orders placed or performed during the hospital encounter of 10/18/19  Respiratory Panel by RT PCR (Flu A&B, Covid) - Nasopharyngeal Swab     Status: None   Collection Time: 10/18/19  9:06 PM   Specimen: Nasopharyngeal Swab  Result Value Ref Range Status   SARS Coronavirus 2 by RT PCR NEGATIVE NEGATIVE Final    Comment: (NOTE) SARS-CoV-2 target nucleic acids are NOT DETECTED. The SARS-CoV-2 RNA is generally detectable in upper respiratoy specimens during the acute phase of infection. The lowest concentration of SARS-CoV-2 viral copies this assay can detect is 131 copies/mL. A negative result does not preclude SARS-Cov-2 infection and should not be used as the sole basis for treatment or other patient management decisions. A negative result may occur with  improper specimen collection/handling, submission of specimen other than nasopharyngeal swab, presence of viral mutation(s) within the areas targeted by this assay, and inadequate number of viral copies (<131 copies/mL). A negative result must be combined with clinical observations, patient history, and epidemiological information. The expected result is Negative. Fact Sheet for Patients:  PinkCheek.be Fact Sheet for Healthcare Providers:  GravelBags.it This test is not yet ap proved or cleared by the [place] FDA and  has been authorized for detection and/or diagnosis of SARS-CoV-2 by FDA under an Emergency Use Authorization (EUA). This EUA will remain  in effect (meaning this test can be used) for the duration of the COVID-19  declaration under Section 564(b)(1) of the Act, 21 U.S.C. section 360bbb-3(b)(1), unless the authorization is terminated or revoked sooner.    Influenza A by PCR NEGATIVE NEGATIVE Final   Influenza B by PCR NEGATIVE NEGATIVE Final    Comment: (NOTE) The Xpert Xpress SARS-CoV-2/FLU/RSV assay is intended as an aid in  the diagnosis of influenza from Nasopharyngeal swab specimens and  should not be used as a sole basis for treatment. Nasal washings and  aspirates are unacceptable for Xpert Xpress SARS-CoV-2/FLU/RSV  testing. Fact Sheet for Patients: PinkCheek.be Fact Sheet for Healthcare Providers: GravelBags.it This test is not yet approved or cleared by the [place] and  has been authorized  for detection and/or diagnosis of SARS-CoV-2 by  FDA under an Emergency Use Authorization (EUA). This EUA will remain  in effect (meaning this test can be used) for the duration of the  Covid-19 declaration under Section 564(b)(1) of the Act, 21  U.S.C. section 360bbb-3(b)(1), unless the authorization is  terminated or revoked. Performed at [facility], [address] [place], [place] [address]     Coagulation Studies: No results for input(s): LABPROT, INR in the last 72 hours.  Urinalysis:  Recent Labs  Lab 10/18/19 2147  COLORURINE YELLOW*  LABSPEC 1.022  PHURINE 6.0  GLUCOSEU >=500*  HGBUR NEGATIVE  BILIRUBINUR NEGATIVE  KETONESUR NEGATIVE  PROTEINUR 30*  NITRITE NEGATIVE  LEUKOCYTESUR NEGATIVE    Lipid Panel:     Component Value Date/Time   CHOL 185 10/19/2019 0640   CHOL 212 (H) 06/30/2012 0405   TRIG 164 (H) 10/19/2019 0640   TRIG 359 (H) 06/30/2012 0405   HDL 36 (L) 10/19/2019 0640   HDL 30 (L) 06/30/2012 0405   CHOLHDL 5.1 10/19/2019 0640   VLDL 33 10/19/2019 0640   VLDL 72 (H) 06/30/2012 0405   LDLCALC 116 (H) 10/19/2019 0640   LDLCALC 102 (H) 10/08/2018 0845   LDLCALC 110 (H)  06/30/2012 0405    HgbA1C:  Lab Results  Component Value Date   HGBA1C 9.6 (H) 10/19/2019    Urine Drug Screen:      Component Value Date/Time   LABOPIA NONE DETECTED 10/18/2019 2147   COCAINSCRNUR NONE DETECTED 10/18/2019 2147   LABBENZ NONE DETECTED 10/18/2019 2147   AMPHETMU NONE DETECTED 10/18/2019 2147   THCU NONE DETECTED 10/18/2019 2147   LABBARB NONE DETECTED 10/18/2019 2147    Alcohol Level:  Recent Labs  Lab 10/18/19 2106  ETH <10    Other results: EKG: sinus rhythm at 62 bpm.  Imaging: CT Head Wo Contrast  Result Date: 10/18/2019 CLINICAL DATA:  74 year old male with altered mental status, confusion, ataxia. EXAM: CT HEAD WITHOUT CONTRAST TECHNIQUE: Contiguous axial images were obtained from the base of the skull through the vertex without intravenous contrast. COMPARISON:  Head CT 05/02/2013. FINDINGS: Brain: Cerebral volume is not significantly changed since 2014. No midline shift, mass effect, or evidence of intracranial mass lesion. No ventriculomegaly. Chronic Patchy and confluent bilateral white matter hypodensity has not significantly changed. Chronic deep gray matter nuclei heterogeneity appears stable. No cortically based acute infarct identified. No definite cortical encephalomalacia. No acute intracranial hemorrhage identified. Vascular: Calcified atherosclerosis at the skull base. No suspicious intracranial vascular hyperdensity. Skull: Stable.  No acute osseous abnormality identified. Sinuses/Orbits: Visualized paranasal sinuses and mastoids are clear. Other: No acute orbit or scalp soft tissue finding. IMPRESSION: No acute intracranial abnormality. Chronic small-vessel disease suspected but not significantly changed by CT since 2014. Electronically Signed   By: Genevie Ann M.D.   On: 10/18/2019 21:30   CT Cervical Spine Wo Contrast  Result Date: 10/18/2019 CLINICAL DATA:  74 year old male with altered mental status, confusion, ataxia. EXAM: CT CERVICAL SPINE  WITHOUT CONTRAST TECHNIQUE: Multidetector CT imaging of the cervical spine was performed without intravenous contrast. Multiplanar CT image reconstructions were also generated. COMPARISON:  Cervical spine radiographs 06/26/2014. chest CT 09/01/2018 FINDINGS: Alignment: Stable chronic straightening of cervical lordosis. Cervicothoracic junction alignment is within normal limits. Bilateral posterior element alignment is within normal limits. Skull base and vertebrae: Visualized skull base is intact. No atlanto-occipital dissociation. No acute osseous abnormality identified. Chronic interbody arthrodesis or ankylosis at C5-C6. Soft tissues and spinal  canal: No prevertebral fluid or swelling. No visible canal hematoma. Calcified atherosclerosis in the neck and at the skull base. Otherwise negative noncontrast neck soft tissues. Disc levels: Moderate right occipital condyle-C1 degeneration with joint space loss, subchondral sclerosis and small subchondral cysts. Multilevel upper cervical facet arthropathy. Lower cervical chronic disc and endplate degeneration. Upper chest: Chronic left apical lung scarring appears stable from the 09/01/2018 CT. Visible upper thoracic levels appear intact. IMPRESSION: 1. No acute osseous abnormality in the cervical spine. 2. Advanced skull base and cervical spine degeneration. Chronic C5-C6 interbody ankylosis. 3. Left apical lung scarring appears stable from the 09/01/2018 Chest CT. Electronically Signed   By: Genevie Ann M.D.   On: 10/18/2019 21:35   MR Brain W and Wo Contrast  Result Date: 10/18/2019 CLINICAL DATA:  74 year old male with altered mental status, confusion, ataxia. EXAM: MRI HEAD WITHOUT AND WITH CONTRAST TECHNIQUE: Multiplanar, multiecho pulse sequences of the brain and surrounding structures were obtained without and with intravenous contrast. CONTRAST:  4m GADAVIST GADOBUTROL 1 MMOL/ML IV SOLN COMPARISON:  Head and cervical spine CT earlier today. FINDINGS: Brain: No  restricted diffusion to suggest acute infarction. No midline shift, mass effect, evidence of mass lesion, ventriculomegaly, extra-axial collection or acute intracranial hemorrhage. Cervicomedullary junction and pituitary are within normal limits. Widespread and confluent bilateral cerebral white matter T2 and FLAIR hyperintensity. Similar extensive T2 and FLAIR heterogeneity throughout the bilateral deep gray nuclei. Evidence of chronic lacunar infarcts in the lateral left pons and left cerebellum. Scattered chronic microhemorrhages in the bilateral thalami, right temporal and occipital lobes, left cerebellum. No definite cortical encephalomalacia. No abnormal enhancement identified. No dural thickening. Vascular: Major intracranial vascular flow voids are preserved. Skull and upper cervical spine: Negative visible cervical spine. Visualized bone marrow signal is within normal limits. Sinuses/Orbits: Negative orbits. Paranasal sinuses are clear. Other: Mastoids are well pneumatized. Grossly normal visible internal auditory structures. Scalp and face soft tissues appear negative. IMPRESSION: 1. Evidence of chronically advanced small vessel disease but no acute intracranial abnormality is identified. 2. Chronic microhemorrhages scattered in the deep gray nuclei, right hemisphere, left cerebellum. Electronically Signed   By: HGenevie AnnM.D.   On: 10/18/2019 23:54     Assessment/Plan: 74y.o. male with a history of HTN, HLD, DM and stroke in the past who presented on yesterday with AMS.  Reports he is now back to baseline.  BP elevated on presentation.  Neurological examination is nonfocal.  MRI of the brain personally reviewed and shows no acute changes.  Chronic small vessel ischemic changes and microhemorrhages noted.   On ASA daily.  Echocardiogram and carotid dopplers are pending. Although TIA on the differential, also can not rule out hypertensive urgency (initial BP 236/82) or seizure.     Recommendations: 1. Agree with continued ASA 2. EEG 3. Agree with BP control 4. PT for gait evaluation.      LAlexis Goodell MD Neurology [phone]/17/2021, 11:33 AM

## 2019-10-20 DIAGNOSIS — I10 Essential (primary) hypertension: Secondary | ICD-10-CM

## 2019-10-20 DIAGNOSIS — I161 Hypertensive emergency: Principal | ICD-10-CM

## 2019-10-20 DIAGNOSIS — G9341 Metabolic encephalopathy: Secondary | ICD-10-CM

## 2019-10-20 DIAGNOSIS — I674 Hypertensive encephalopathy: Secondary | ICD-10-CM

## 2019-10-20 LAB — BASIC METABOLIC PANEL
Anion gap: 6 (ref 5–15)
BUN: 13 mg/dL (ref 8–23)
CO2: 27 mmol/L (ref 22–32)
Calcium: 8.9 mg/dL (ref 8.9–10.3)
Chloride: 106 mmol/L (ref 98–111)
Creatinine, Ser: 0.81 mg/dL (ref 0.61–1.24)
GFR calc Af Amer: >60 mL/min (ref >60)
GFR calc non Af Amer: >60 mL/min (ref >60)
Glucose, Bld: 187 mg/dL — ABNORMAL HIGH (ref 70–99)
Potassium: 3.7 mmol/L (ref 3.5–5.1)
Sodium: 139 mmol/L (ref 135–145)

## 2019-10-20 LAB — GLUCOSE, CAPILLARY
Glucose-Capillary: 182 mg/dL — ABNORMAL HIGH (ref 70–99)
Glucose-Capillary: 309 mg/dL — ABNORMAL HIGH (ref 70–99)

## 2019-10-20 LAB — CBC
HCT: 43.5 % (ref 39.0–52.0)
Hemoglobin: 15 g/dL (ref 13.0–17.0)
MCH: 29.2 pg (ref 26.0–34.0)
MCHC: 34.5 g/dL (ref 30.0–36.0)
MCV: 84.6 fL (ref 80.0–100.0)
Platelets: 172 10*3/uL (ref 150–400)
RBC: 5.14 MIL/uL (ref 4.22–5.81)
RDW: 12.9 % (ref 11.5–15.5)
WBC: 8.3 10*3/uL (ref 4.0–10.5)
nRBC: 0 % (ref 0.0–0.2)

## 2019-10-20 LAB — MAGNESIUM: Magnesium: 2 mg/dL (ref 1.7–2.4)

## 2019-10-20 MED ORDER — NOVOLIN 70/30 FLEXPEN (70-30) 100 UNIT/ML ~~LOC~~ SUPN: 15.0000 [IU] | PEN_INJECTOR | Freq: Two times a day (BID) | SUBCUTANEOUS | 11 refills | Status: DC

## 2019-10-20 MED ORDER — AMLODIPINE BESYLATE 10 MG PO TABS: 10.0000 mg | ORAL_TABLET | Freq: Every day | ORAL | 0 refills | Status: AC

## 2019-10-20 MED ORDER — ENALAPRIL MALEATE 20 MG PO TABS: 20.0000 mg | ORAL_TABLET | Freq: Every day | ORAL | 0 refills | Status: DC

## 2019-10-20 MED ORDER — ATORVASTATIN CALCIUM 40 MG PO TABS: 40.0000 mg | ORAL_TABLET | Freq: Every day | ORAL | 0 refills | Status: DC

## 2019-10-20 NOTE — Progress Notes (Signed)
Inpatient Diabetes Program Recommendations  AACE/ADA: New Consensus Statement on Inpatient Glycemic Control   Target Ranges:  Prepandial:   less than 140 mg/dL      Peak postprandial:   less than 180 mg/dL (1-2 hours)      Critically ill patients:  140 - 180 mg/dL   Results for Victor Fox, Victor Fox (MRN [id number]) as of 10/20/2019 13:44  Ref. Range 09/03/2018 08:39 09/03/2018 12:08 10/19/2019 11:30 10/19/2019 16:43 10/19/2019 18:36 10/19/2019 21:09 10/19/2019 22:18 10/20/2019 07:32 10/20/2019 11:48  Glucose-Capillary Latest Ref Range: 70 - 99 mg/dL 257 (H) 274 (H) 248 (H) 197 (H) 289 (H) 174 (H) 179 (H) 182 (H) 309 (H)  Results for Victor Fox, Victor Fox (MRN [id number]) as of 10/20/2019 13:44  Ref. Range 10/08/2018 08:45 10/19/2019 06:40  Hemoglobin A1C Latest Ref Range: 4.8 - 5.6 % 11.7 (H) 9.6 (H)   Review of Glycemic Control  Diabetes history: DM2 Outpatient Diabetes medications: Novolin 70/30 30 units daily at noon Current orders for Inpatient glycemic control: 70/30 30 units daily at noon, Novolog 0-9 units AC7HS  Inpatient Diabetes Program Recommendations:    A1C: A1C 9.6% on 10/19/19 indicating an average glucose of 229 mg/dl over the past 2-3 months. Patient states he is only taking 70/30 once a day for DM management. Would recommend changing 70/30 to 15 units BID and resuming Metformin 500 mg BID.  NOTE: Spoke with patient over the phone about diabetes and home regimen for diabetes control. Patient reports he use to see a doctor at the clinic (not sure who or the name of clinic) and that he is taking Novolin 70/30 once a day at noon. When asked about dose of 70/30 he was not sure how much but thinks it is around 30 units.  Inquired about Metformin and Ozempic noted home medication list. Patient states that he has not taken Metformin since he ran out a while back and states he didn't know he was suppose to still be taking it. Patient reports he does not take Ozempic because it cost too much.  Patient states that  he gets Novolin 70/30 vials from [facility] and his neighbor gives him 70/30 insulin pens at times. Patient states that he would prefer to use insulin pens. Informed patient that Novolin 70/30 insulin pens are $43 per box of 5 insulin pens at [facility]. Patient was not aware that he could get Novolin 70/30 insulin pens at [facility] and he states that he can afford to get the Novolin 70/30 insulin pens from [place]. Patient states he has everything to monitor glucose at home and his glucose is usually 200-300's mg/dl. Discussed 70/30 insulin and explained how it works. Question if DM would be better controlled with 70/30 BID dosing. Patient is agreeable to take 70/30 BID with breakfast and supper if MD prescribes at discharge. Explained to patient that he likely needs to resume Metformin and discussed how Metformin works for DM control.  Patient state that he will restart Metformin if prescribed at discharge.  Discussed A1C results (9.6% on 10/19/19) and explained that current A1C indicates an average glucose of 229 mg/dl over the past 2-3 months. Discussed glucose and A1C goals. Discussed importance of checking CBGs and maintaining good CBG control to prevent long-term and short-term complications. Explained how hyperglycemia leads to damage within blood vessels which lead to the common complications seen with uncontrolled diabetes. Stressed to the patient the importance of improving glycemic control to prevent further complications from uncontrolled diabetes. Encouraged patient to check glucose and get  an appointment with a provider for follow up. Patient states he plans to go by and see if he can get an appointment for follow up.  Patient verbalized understanding of information discussed and reports no further questions at this time related to diabetes. At time of discharge please provide Rx for: NOVOLIN 70/30 Flexpens [id number]), insulin pen needles [id number]), and Metformin if MD plans to prescribe at  d/c.  Thanks, Barnie Alderman, RN, MSN, CDE Diabetes Coordinator Inpatient Diabetes Program [phone] (Team Pager)

## 2019-10-20 NOTE — Progress Notes (Signed)
Patient discharged , patient IV removed from right hand. Patients friend " Victor Fox " [place]

## 2019-10-20 NOTE — Progress Notes (Signed)
Subjective: Patient reports being at baseline today.  No new neurological complaints.    Objective: Current vital signs: BP (!) 166/65 (BP Location: Right Arm)   Pulse (!) 55   Temp 97.7 F (36.5 C) (Oral)   Resp 18   Ht 6' (1.829 m)   Wt 90.7 kg   SpO2 95%   BMI 27.12 kg/m  Vital signs in last 24 hours: Temp:  [97.5 F (36.4 C)-98.4 F (36.9 C)] 97.7 F (36.5 C) (02/18 0731) Pulse Rate:  [55-68] 55 (02/18 0731) Resp:  [18] 18 (02/18 0731) BP: (152-179)/(62-94) 166/65 (02/18 0731) SpO2:  [95 %-100 %] 95 % (02/18 0731)  Intake/Output from previous day: 02/17 0701 - 02/18 0700 In: 1101.3 [I.V.:1101.3] Out: 1075 [Urine:1075] Intake/Output this shift: Total I/O In: 240 [P.O.:240] Out: 350 [Urine:350] Nutritional status:  Diet Order            Diet heart healthy/carb modified Room service appropriate? Yes; Fluid consistency: Thin  Diet effective now              Neurologic Exam: Mental Status: Alert, oriented to person and place.  Knew the President and month.  Struggled with the year.  Speech fluent without evidence of aphasia.  Able to follow 3 step commands without difficulty. Cranial Nerves: II: Visual fields grossly normal, pupils equal, round, reactive to light and accommodation III,IV, VI: ptosis not present, extra-ocular motions intact bilaterally V,VII: smile symmetric, facial light touch sensation normal bilaterally VIII: hearing normal bilaterally IX,X: gag reflex present XI: bilateral shoulder shrug XII: midline tongue extension Motor: 5/5 throughout Sensory: Pinprick and light touch intact throughout, bilaterally  Lab Results: Basic Metabolic Panel: Recent Labs  Lab 10/18/19 2106 10/20/19 0509  NA 138 139  K 4.1 3.7  CL 101 106  CO2 28 27  GLUCOSE 240* 187*  BUN 12 13  CREATININE 0.97 0.81  CALCIUM 9.4 8.9  MG  --  2.0    Liver Function Tests: Recent Labs  Lab 10/18/19 2106  AST 17  ALT 15  ALKPHOS 74  BILITOT 0.7  PROT 6.7   ALBUMIN 3.7   No results for input(s): LIPASE, AMYLASE in the last 168 hours. No results for input(s): AMMONIA in the last 168 hours.  CBC: Recent Labs  Lab 10/18/19 2106 10/20/19 0509  WBC 8.8 8.3  NEUTROABS 4.7  --   HGB 15.4 15.0  HCT 45.0 43.5  MCV 84.9 84.6  PLT 195 172    Cardiac Enzymes: No results for input(s): CKTOTAL, CKMB, CKMBINDEX, TROPONINI in the last 168 hours.  Lipid Panel: Recent Labs  Lab 10/19/19 0640  CHOL 185  TRIG 164*  HDL 36*  CHOLHDL 5.1  VLDL 33  LDLCALC 116*    CBG: Recent Labs  Lab 10/19/19 1836 10/19/19 2109 10/19/19 2218 10/20/19 0732 10/20/19 1148  GLUCAP 289* 174* 179* 182* 309*    Microbiology: Results for orders placed or performed during the hospital encounter of 10/18/19  Respiratory Panel by RT PCR (Flu A&B, Covid) - Nasopharyngeal Swab     Status: None   Collection Time: 10/18/19  9:06 PM   Specimen: Nasopharyngeal Swab  Result Value Ref Range Status   SARS Coronavirus 2 by RT PCR NEGATIVE NEGATIVE Final    Comment: (NOTE) SARS-CoV-2 target nucleic acids are NOT DETECTED. The SARS-CoV-2 RNA is generally detectable in upper respiratoy specimens during the acute phase of infection. The lowest concentration of SARS-CoV-2 viral copies this assay can detect is 131 copies/mL. A negative  result does not preclude SARS-Cov-2 infection and should not be used as the sole basis for treatment or other patient management decisions. A negative result may occur with  improper specimen collection/handling, submission of specimen other than nasopharyngeal swab, presence of viral mutation(s) within the areas targeted by this assay, and inadequate number of viral copies (<131 copies/mL). A negative result must be combined with clinical observations, patient history, and epidemiological information. The expected result is Negative. Fact Sheet for Patients:  PinkCheek.be Fact Sheet for Healthcare  Providers:  GravelBags.it This test is not yet ap proved or cleared by the [place] FDA and  has been authorized for detection and/or diagnosis of SARS-CoV-2 by FDA under an Emergency Use Authorization (EUA). This EUA will remain  in effect (meaning this test can be used) for the duration of the COVID-19 declaration under Section 564(b)(1) of the Act, 21 U.S.C. section 360bbb-3(b)(1), unless the authorization is terminated or revoked sooner.    Influenza A by PCR NEGATIVE NEGATIVE Final   Influenza B by PCR NEGATIVE NEGATIVE Final    Comment: (NOTE) The Xpert Xpress SARS-CoV-2/FLU/RSV assay is intended as an aid in  the diagnosis of influenza from Nasopharyngeal swab specimens and  should not be used as a sole basis for treatment. Nasal washings and  aspirates are unacceptable for Xpert Xpress SARS-CoV-2/FLU/RSV  testing. Fact Sheet for Patients: PinkCheek.be Fact Sheet for Healthcare Providers: GravelBags.it This test is not yet approved or cleared by the [place] FDA and  has been authorized for detection and/or diagnosis of SARS-CoV-2 by  FDA under an Emergency Use Authorization (EUA). This EUA will remain  in effect (meaning this test can be used) for the duration of the  Covid-19 declaration under Section 564(b)(1) of the Act, 21  U.S.C. section 360bbb-3(b)(1), unless the authorization is  terminated or revoked. Performed at [facility], [address] [place], [place] [address]     Coagulation Studies: No results for input(s): LABPROT, INR in the last 72 hours.  Imaging: EEG  Result Date: 10/19/2019 Alexis Goodell, MD     10/19/2019  4:21 PM ELECTROENCEPHALOGRAM REPORT Patient: Victor Fox       Room #: [id number] EEG No. ID: [id number] Age: 74 y.o.        Sex: male Requesting Physician: Arbutus Ped Report Date:  10/19/2019       Interpreting Physician: Alexis Goodell  History: Victor Fox is an 74 y.o. male with episode of confusion Medications: Norvasc, ASA, Lipitor, Plavix, Vasotec, Insulin Conditions of Recording:  This is a 21 channel routine scalp EEG performed with bipolar and monopolar montages arranged in accordance to the international 10/20 system of electrode placement. One channel was dedicated to EKG recording. The patient is in the awake, drowsy and asleep states. Description:  The waking background activity consists of a low voltage, symmetrical, fairly well organized, 7 Hz theta activity, seen from the parieto-occipital and posterior temporal regions.  Low voltage fast activity, poorly organized, is seen anteriorly and is at times superimposed on more posterior regions.  A mixture of theta and alpha rhythms are seen from the central and temporal regions. The patient drowses with slowing to irregular, low voltage theta and beta activity.  The patient goes in to a light sleep with symmetrical sleep spindles, vertex central sharp transients and irregular slow activity. No epileptiform activity is noted.  Hyperventilation was not performed.  Intermittent photic stimulation was performed but failed to illicit any change in the tracing. IMPRESSION: This  is an abnormal EEG secondary to mild posterior background slowing.  This finding may be seen with a diffuse gray matter disturbance that is etiologically nonspecific, but may include a dementia, among other possibilities.  No epileptiform activity is noted.  Alexis Goodell, MD Neurology [phone] 10/19/2019, 4:19 PM   CT Head Wo Contrast  Result Date: 10/18/2019 CLINICAL DATA:  73 year old male with altered mental status, confusion, ataxia. EXAM: CT HEAD WITHOUT CONTRAST TECHNIQUE: Contiguous axial images were obtained from the base of the skull through the vertex without intravenous contrast. COMPARISON:  Head CT 05/02/2013. FINDINGS: Brain: Cerebral volume is not significantly changed since 2014. No midline  shift, mass effect, or evidence of intracranial mass lesion. No ventriculomegaly. Chronic Patchy and confluent bilateral white matter hypodensity has not significantly changed. Chronic deep gray matter nuclei heterogeneity appears stable. No cortically based acute infarct identified. No definite cortical encephalomalacia. No acute intracranial hemorrhage identified. Vascular: Calcified atherosclerosis at the skull base. No suspicious intracranial vascular hyperdensity. Skull: Stable.  No acute osseous abnormality identified. Sinuses/Orbits: Visualized paranasal sinuses and mastoids are clear. Other: No acute orbit or scalp soft tissue finding. IMPRESSION: No acute intracranial abnormality. Chronic small-vessel disease suspected but not significantly changed by CT since 2014. Electronically Signed   By: Genevie Ann M.D.   On: 10/18/2019 21:30   CT Cervical Spine Wo Contrast  Result Date: 10/18/2019 CLINICAL DATA:  74 year old male with altered mental status, confusion, ataxia. EXAM: CT CERVICAL SPINE WITHOUT CONTRAST TECHNIQUE: Multidetector CT imaging of the cervical spine was performed without intravenous contrast. Multiplanar CT image reconstructions were also generated. COMPARISON:  Cervical spine radiographs 06/26/2014. chest CT 09/01/2018 FINDINGS: Alignment: Stable chronic straightening of cervical lordosis. Cervicothoracic junction alignment is within normal limits. Bilateral posterior element alignment is within normal limits. Skull base and vertebrae: Visualized skull base is intact. No atlanto-occipital dissociation. No acute osseous abnormality identified. Chronic interbody arthrodesis or ankylosis at C5-C6. Soft tissues and spinal canal: No prevertebral fluid or swelling. No visible canal hematoma. Calcified atherosclerosis in the neck and at the skull base. Otherwise negative noncontrast neck soft tissues. Disc levels: Moderate right occipital condyle-C1 degeneration with joint space loss, subchondral  sclerosis and small subchondral cysts. Multilevel upper cervical facet arthropathy. Lower cervical chronic disc and endplate degeneration. Upper chest: Chronic left apical lung scarring appears stable from the 09/01/2018 CT. Visible upper thoracic levels appear intact. IMPRESSION: 1. No acute osseous abnormality in the cervical spine. 2. Advanced skull base and cervical spine degeneration. Chronic C5-C6 interbody ankylosis. 3. Left apical lung scarring appears stable from the 09/01/2018 Chest CT. Electronically Signed   By: Genevie Ann M.D.   On: 10/18/2019 21:35   MR Brain W and Wo Contrast  Result Date: 10/18/2019 CLINICAL DATA:  74 year old male with altered mental status, confusion, ataxia. EXAM: MRI HEAD WITHOUT AND WITH CONTRAST TECHNIQUE: Multiplanar, multiecho pulse sequences of the brain and surrounding structures were obtained without and with intravenous contrast. CONTRAST:  58mL GADAVIST GADOBUTROL 1 MMOL/ML IV SOLN COMPARISON:  Head and cervical spine CT earlier today. FINDINGS: Brain: No restricted diffusion to suggest acute infarction. No midline shift, mass effect, evidence of mass lesion, ventriculomegaly, extra-axial collection or acute intracranial hemorrhage. Cervicomedullary junction and pituitary are within normal limits. Widespread and confluent bilateral cerebral white matter T2 and FLAIR hyperintensity. Similar extensive T2 and FLAIR heterogeneity throughout the bilateral deep gray nuclei. Evidence of chronic lacunar infarcts in the lateral left pons and left cerebellum. Scattered chronic microhemorrhages in the bilateral thalami, right temporal  and occipital lobes, left cerebellum. No definite cortical encephalomalacia. No abnormal enhancement identified. No dural thickening. Vascular: Major intracranial vascular flow voids are preserved. Skull and upper cervical spine: Negative visible cervical spine. Visualized bone marrow signal is within normal limits. Sinuses/Orbits: Negative orbits.  Paranasal sinuses are clear. Other: Mastoids are well pneumatized. Grossly normal visible internal auditory structures. Scalp and face soft tissues appear negative. IMPRESSION: 1. Evidence of chronically advanced small vessel disease but no acute intracranial abnormality is identified. 2. Chronic microhemorrhages scattered in the deep gray nuclei, right hemisphere, left cerebellum. Electronically Signed   By: Genevie Ann M.D.   On: 10/18/2019 23:54   US Carotid Bilateral (at [facility] and [facility] only)  Result Date: 10/19/2019 CLINICAL DATA:  TIA. History of stroke, hyperlipidemia and diabetes. EXAM: BILATERAL CAROTID DUPLEX ULTRASOUND TECHNIQUE: Pearline Cables scale imaging, color Doppler and duplex ultrasound were performed of bilateral carotid and vertebral arteries in the neck. COMPARISON:  06/29/2012 FINDINGS: Criteria: Quantification of carotid stenosis is based on velocity parameters that correlate the residual internal carotid diameter with NASCET-based stenosis levels, using the diameter of the distal internal carotid lumen as the denominator for stenosis measurement. The following velocity measurements were obtained: RIGHT ICA: 95/9 cm/sec CCA: 0000000 cm/sec SYSTOLIC ICA/CCA RATIO:  1.8 ECA: 73 cm/sec LEFT ICA: 89/8 cm/sec CCA: [id number] cm/sec SYSTOLIC ICA/CCA RATIO:  1.4 ECA: 70 cm/sec RIGHT CAROTID ARTERY: There is a minimal amount of eccentric echogenic plaque involving the distal aspect of the right common carotid artery (image 16). There is a moderate amount of eccentric echogenic plaque within the right carotid bulb (image 24). There is a large amount of eccentric echogenic shadowing plaque involving the origin and proximal aspects of the right internal carotid artery (image 33), progressed compared to the 06/2012 examination, though not resulting in elevated peak systolic velocities within the interrogated course of the right internal carotid artery to suggest a hemodynamically significant stenosis. RIGHT VERTEBRAL ARTERY:   Antegrade flow LEFT CAROTID ARTERY: There is moderate amount of eccentric echogenic plaque involving the proximal (image 45), mid (image 49) and distal (image 53) aspects of the left common carotid artery. There is a large amount of echogenic densely shadowing plaque within the left carotid bulb (image 58), extending to involve the origin and proximal aspects of the left internal carotid artery (image 66, progressed compared to the 06/2012 examination, though not resulting in elevated peak systolic velocities within the interrogated course of the left internal carotid artery to suggest a hemodynamically significant stenosis. LEFT VERTEBRAL ARTERY:  Antegrade Flow IMPRESSION: Large amount of bilateral atherosclerotic plaque, progressed compared to the 06/2012 examination, though not definitely resulting in a hemodynamically significant stenosis within either internal carotid artery. If clinical concern persists, further evaluation could be performed with CTA as clinically indicated. Electronically Signed   By: Sandi Mariscal M.D.   On: 10/19/2019 13:13   ECHOCARDIOGRAM COMPLETE  Result Date: 10/19/2019    ECHOCARDIOGRAM REPORT   Patient Name:   Victor Fox Date of Exam: 10/19/2019 Medical Rec #:  [id number]      Height:       72.0 in Accession #:    [id number]     Weight:       200.0 lb Date of Birth:  05/18/1946       BSA:          2.13 m Patient Age:    58 years       BP:  162/72 mmHg Patient Gender: M              HR:           52 bpm. Exam Location:  [facility] Procedure: 2D Echo, Color Doppler and Cardiac Doppler Indications:     G45.9 TIA  History:         Patient has prior history of Echocardiogram examinations. CAD,                  Aortic Valve Disease; Risk Factors:Current Smoker,                  Hypertension, Diabetes and Dyslipidemia.  Sonographer:     Charmayne Sheer RDCS (AE) Referring Phys:  [id number] Arvella Merles MANSY Diagnosing Phys: Kate Sable MD  Sonographer Comments: No subcostal window. Image  acquisition challenging due to uncooperative patient. IMPRESSIONS  1. Left ventricular ejection fraction, by estimation, is 60 to 65%. The left ventricle has normal function. The left ventricle has no regional wall motion abnormalities. There is mild left ventricular hypertrophy. Left ventricular diastolic parameters were normal.  2. Right ventricular systolic function is normal. The right ventricular size is normal.  3. The mitral valve is degenerative. No evidence of mitral valve regurgitation.  4. The aortic valve is tricuspid. Aortic valve regurgitation is not visualized. Moderate aortic valve stenosis.  5. Pulmonic valve regurgitation not assessed. FINDINGS  Left Ventricle: Left ventricular ejection fraction, by estimation, is 60 to 65%. The left ventricle has normal function. The left ventricle has no regional wall motion abnormalities. The left ventricular internal cavity size was normal in size. There is  mild left ventricular hypertrophy. Left ventricular diastolic parameters were normal. Right Ventricle: The right ventricular size is normal. Right vetricular wall thickness was not assessed. Right ventricular systolic function is normal. Left Atrium: Left atrial size was normal in size. Right Atrium: Right atrial size was normal in size. Pericardium: There is no evidence of pericardial effusion. Mitral Valve: The mitral valve is degenerative in appearance. Mild mitral annular calcification. No evidence of mitral valve regurgitation. MV peak gradient, 4.1 mmHg. The mean mitral valve gradient is 2.0 mmHg. Tricuspid Valve: The tricuspid valve is normal in structure. Tricuspid valve regurgitation is not demonstrated. Aortic Valve: Aortic valve DVI is 0.34. The aortic valve is tricuspid. Aortic valve regurgitation is not visualized. Moderate aortic stenosis is present. There is severe calcifcation of the aortic valve. Aortic valve mean gradient measures 13.0 mmHg. Aortic valve peak gradient measures 26.4 mmHg.  Aortic valve area, by VTI measures 1.4 cm Pulmonic Valve: The pulmonic valve was not well visualized. Pulmonic valve regurgitation not assessed. Aorta: The aortic root is normal in size and structure. IAS/Shunts: No atrial level shunt detected by color flow Doppler.  LEFT VENTRICLE PLAX 2D LVIDd:         4.81 cm  Diastology LVIDs:         2.57 cm  LV e' lateral:   5.66 cm/s LV PW:         1.13 cm  LV E/e' lateral: 14.6 LV IVS:        1.04 cm  LV e' medial:    6.53 cm/s LVOT diam:     2.30 cm  LV E/e' medial:  12.7 LV SV:         76.86 ml LV SV Index:   38.94 LVOT Area:     4.15 cm  RIGHT VENTRICLE RV Basal diam:  3.58 cm LEFT ATRIUM  Index       RIGHT ATRIUM           Index LA diam:        3.70 cm 1.74 cm/m  RA Area:     16.30 cm LA Vol (A2C):   47.5 ml 22.29 ml/m RA Volume:   42.70 ml  20.04 ml/m LA Vol (A4C):   30.8 ml 14.46 ml/m LA Biplane Vol: 38.7 ml 18.16 ml/m  AORTIC VALVE AV Area (Vmax):    1.45 cm AV Area (Vmean):   1.84 cm AV Area (VTI):     1.81 cm AV Vmax:           257.00 cm/s AV Vmean:          134.000 cm/s AV VTI:            0.425 m AV Peak Grad:      26.4 mmHg AV Mean Grad:      13.0 mmHg LVOT Vmax:         89.40 cm/s LVOT Vmean:        59.300 cm/s LVOT VTI:          0.185 m LVOT/AV VTI ratio: 0.44  AORTA Ao Root diam: 3.50 cm MITRAL VALVE MV Area (PHT): 2.80 cm    SHUNTS MV Peak grad:  4.1 mmHg    Systemic VTI:  0.18 m MV Mean grad:  2.0 mmHg    Systemic Diam: 2.30 cm MV Vmax:       1.01 m/s MV Vmean:      60.4 cm/s MV Decel Time: 271 msec MV E velocity: 82.90 cm/s MV A velocity: 96.00 cm/s MV E/A ratio:  0.86 Kate Sable MD Electronically signed by Kate Sable MD Signature Date/Time: 10/19/2019/1:34:16 PM    Final     Medications:  I have reviewed the patient's current medications. Scheduled: . amLODipine  10 mg Oral Daily  . aspirin EC  81 mg Oral Daily  . atorvastatin  40 mg Oral q1800  . clopidogrel  75 mg Oral Daily  . enalapril  20 mg Oral Daily  .  enoxaparin (LOVENOX) injection  40 mg Subcutaneous Q24H  . insulin aspart  0-9 Units Subcutaneous TID PC & HS  . insulin aspart protamine- aspart  30 Units Subcutaneous Daily    Assessment/Plan: 74 y.o. male with a history of HTN, HLD, DM and stroke in the past who presented on yesterday with AMS.  Reports he is now back to baseline.  BP elevated on presentation.  Neurological examination is nonfocal.  MRI of the brain personally reviewed and shows no acute changes.  Chronic small vessel ischemic changes and microhemorrhages noted.   On ASA daily. Carotid dopplers show no evidence of hemodynamically significant stenosis but moderate to large amount of plaque on the left .  Echocardiogram shows no cardiac source of emboli with an EF of 60-65%.  A1c 9.6, LDL 116.  EEG shows no epileptiform activity. Presenting symptoms may very well have been related to BP.    Recommendations: 1.  Blood sugar management with target A1c<7.0 2.  Aggressive lipid management with target LDL<70. 3.  Continue ASA daily 4.  Agree with BP control 5.  Patient to have follow up of carotid plaque with CTA of the head and neck.  This may be performed as an outpatient.     LOS: 1 day   Alexis Goodell, MD Neurology [phone] 10/20/2019  1:06 PM

## 2019-10-20 NOTE — Discharge Summary (Addendum)
Physician Discharge Summary  Victor Fox DTO:[id number] DOB: 1945/10/31 DOA: 10/18/2019  PCP: Patient, No Pcp Per Enid Derry MD Admit date: 10/18/2019 Discharge date: 10/20/2019  Recommendations for Outpatient Follow-up:  Hypertensive encephalopathy --Carotid Dopplers with large amount of plaque but not hemodynamically significant.  I discussed with Dr. Doy Mince and she recommended discharge home but outpatient and consideration be given to CTA neck.   Follow-up Information    [name], [name], MD. Schedule an appointment as soon as possible for a visit in 1 week(s).   Specialty: Family Medicine Contact information: [address] [place] [address] [phone]            Discharge Diagnoses: Principal diagnosis is #1 1. Acute encephalopathy, presented with confusion, difficulty speaking.  2. Hypertensive emergency with hypertensive encephalopathy 3. Diabetes mellitus type 2, uncontrolled 4. Hyperlipidemia   Discharge Condition: improve Disposition: home  Diet recommendation: heart healthy  Filed Weights   10/18/19 2101  Weight: 90.7 kg    History of present illness:  74 year old man PMH including hypertension, CVA presented with acute onset of encephalopathy with confusion, difficulty operating his car, abnormal speech.  Found to have BP 236/82, other work-up unremarkable.  Admitted for acute encephalopathy with confusion, abnormal speech; hypertensive emergency/urgency.  Hospital Course:  Symptoms spontaneously resolved.  He had no recurrence.  Work-up was generally unremarkable.  He was seen by neurology with recommendation for discharge home on continued aspirin.  Follow-up in the outpatient setting for consideration of CTA of the neck.  In further discussion with the patient, he reports being out of his antihypertensives for several months.  This certainly explains his presentation.  Acute encephalopathy, presented with confusion, difficulty  speaking.  Thought secondary to hypertensive encephalopathy.  Symptoms seem to have resolved with control of blood pressure.  CVA was considered but ruled out by MRI.  TIA seems less likely.  EEG was nonspecific. --Neurology recommended continuing aspirin, check EEG.  EEG nonspecific as above. --Echocardiogram reassuring and carotid Dopplers with large amount of plaque but not hemodynamically significant.  I discussed with Dr. Doy Mince and she recommended discharge home but outpatient and consideration be given to CTA neck. --PT evaluation: No PT follow-up recommended.  OT recommended no follow-up.  ST evaluation screen noted, did not require formal evaluation.  Hypertensive emergency with hypertensive encephalopathy.  BP 236/80 on admission.  Known essential hypertension. --Secondary to patient having run out of his outpatient medications.    I have sent a prescription for amlodipine and enalapril to his pharmacy.  Diabetes mellitus type 2, uncontrolled.  Hemoglobin A1c 9.6. --Continue home insulin.  Resume metformin on discharge.  Hyperlipidemia continue statin.  Significant Hospital Events    2/16 admitted for hypertensive encephalopathy, confusion, difficulty speaking  2/17 noted to be significantly better  2/17 neurology consultation.  Exam was nonfocal.  MRI brain no acute changes.  Consults:   Neurology  Procedures:     Significant Diagnostic Tests:   MRI brain chronically revealed small vessel disease.  No acute intracranial abnormality.  Chronic microhemorrhages.    CT head and neck no acute abnormalities.    EEG: This is an abnormal EEG secondary tomildposterior background slowing. This finding may be seen with a diffuse gray matter disturbance that is etiologically nonspecific, but may include a dementia, among other possibilities. No epileptiform activity is noted.  Carotid ultrasound with large amount bilateral atherosclerotic plaque but not resulting in  hemodynamically significant stenosis.  Echocardiogram LVEF 60-65%.  Normal function.  Normal  diastolic parameters.  RV function systolic normal.  Micro Data:   SARS-CoV-2 negative   Today's assessment: S: Feels fine today, no complaints.  No difficulty speaking or swallowing.  Ambulating fine.  Feels back to normal.  He reports he has been out of all his medication for several months other than insulin which he does have. O: Vitals:  Vitals:   10/20/19 0648 10/20/19 0731  BP: (!) 152/73 (!) 166/65  Pulse: (!) 58 (!) 55  Resp: 18 18  Temp: 97.6 F (36.4 C) 97.7 F (36.5 C)  SpO2: 97% 95%    Constitutional:  . Appears calm and comfortable Respiratory:  . CTA bilaterally, no w/r/r.  . Respiratory effort normal.  Cardiovascular:  . RRR, no m/r/g Musculoskeletal:  .  RUE, LUE, RLE, LLE   o strength and tone grossly normal, no atrophy, no abnormal movements Neurologic:  . Grossly nonfocal Psychiatric:  . Mental status o Mood, affect appropriate   Diabetic coordinator recommended changing to 15 units twice daily on the Novolin 70/30.  Patient agreeable per diabetic coordinator. Discharge Instructions  Discharge Instructions    Diet - low sodium heart healthy   Complete by: As directed    Discharge instructions   Complete by: As directed    Call your physician or seek immediate medical attention for confusion, passing out, weakness, difficulty speaking or swallowing or worsening of your condition. Be sure to see your doctor in a week   Increase activity slowly   Complete by: As directed      Allergies as of 10/20/2019   No Known Allergies     Medication List    STOP taking these medications   meclizine 25 MG tablet Commonly known as: ANTIVERT   metFORMIN 500 MG 24 hr tablet Commonly known as: GLUCOPHAGE-XR   Semaglutide(0.25 or 0.5MG/DOS) 2 MG/1.5ML Sopn Commonly known as: Ozempic (0.25 or 0.5 MG/DOSE)     TAKE these medications   amLODipine 10 MG  tablet Commonly known as: NORVASC Take 1 tablet (10 mg total) by mouth daily. Appointment needed please for further refills   aspirin EC 81 MG tablet Take 81 mg by mouth daily.   atorvastatin 40 MG tablet Commonly known as: LIPITOR Take 1 tablet (40 mg total) by mouth at bedtime.   B-D UF III MINI PEN NEEDLES 31G X 5 MM Misc Generic drug: Insulin Pen Needle use once daily   blood glucose meter kit and supplies Kit Dispense based on patient and insurance preference. Use up to four times daily as directed. (FOR ICD-9 250.00, 250.01).   enalapril 20 MG tablet Commonly known as: VASOTEC Take 1 tablet (20 mg total) by mouth daily.   NovoLIN 70/30 FlexPen (70-30) 100 UNIT/ML PEN Generic drug: Insulin Isophane & Regular Human Inject 15 Units into the skin in the morning and at bedtime. What changed:   how much to take  when to take this  additional instructions   ONE TOUCH ULTRA TEST test strip Generic drug: glucose blood USE QID UTD   OneTouch Delica Plus [id number] Misc USE QID UTD      No Known Allergies  The results of significant diagnostics from this hospitalization (including imaging, microbiology, ancillary and laboratory) are listed below for reference.    Significant Diagnostic Studies: EEG  Result Date: 10/19/2019 Alexis Goodell, MD     10/19/2019  4:21 PM ELECTROENCEPHALOGRAM REPORT Patient: Victor Fox       Room #: BL39Q EEG No. ID: [id number] Age: 74 y.o.  Sex: male Requesting Physician: [place] Ped Report Date:  10/19/2019       Interpreting Physician: Alexis Goodell History: Victor Fox is an 74 y.o. male with episode of confusion Medications: Norvasc, ASA, Lipitor, Plavix, Vasotec, Insulin Conditions of Recording:  This is a 21 channel routine scalp EEG performed with bipolar and monopolar montages arranged in accordance to the international 10/20 system of electrode placement. One channel was dedicated to EKG recording. The patient is in the awake,  drowsy and asleep states. Description:  The waking background activity consists of a low voltage, symmetrical, fairly well organized, 7 Hz theta activity, seen from the parieto-occipital and posterior temporal regions.  Low voltage fast activity, poorly organized, is seen anteriorly and is at times superimposed on more posterior regions.  A mixture of theta and alpha rhythms are seen from the central and temporal regions. The patient drowses with slowing to irregular, low voltage theta and beta activity.  The patient goes in to a light sleep with symmetrical sleep spindles, vertex central sharp transients and irregular slow activity. No epileptiform activity is noted.  Hyperventilation was not performed.  Intermittent photic stimulation was performed but failed to illicit any change in the tracing. IMPRESSION: This is an abnormal EEG secondary to mild posterior background slowing.  This finding may be seen with a diffuse gray matter disturbance that is etiologically nonspecific, but may include a dementia, among other possibilities.  No epileptiform activity is noted.  Alexis Goodell, MD Neurology [phone] 10/19/2019, 4:19 PM   CT Head Wo Contrast  Result Date: 10/18/2019 CLINICAL DATA:  74 year old male with altered mental status, confusion, ataxia. EXAM: CT HEAD WITHOUT CONTRAST TECHNIQUE: Contiguous axial images were obtained from the base of the skull through the vertex without intravenous contrast. COMPARISON:  Head CT 05/02/2013. FINDINGS: Brain: Cerebral volume is not significantly changed since 2014. No midline shift, mass effect, or evidence of intracranial mass lesion. No ventriculomegaly. Chronic Patchy and confluent bilateral white matter hypodensity has not significantly changed. Chronic deep gray matter nuclei heterogeneity appears stable. No cortically based acute infarct identified. No definite cortical encephalomalacia. No acute intracranial hemorrhage identified. Vascular: Calcified  atherosclerosis at the skull base. No suspicious intracranial vascular hyperdensity. Skull: Stable.  No acute osseous abnormality identified. Sinuses/Orbits: Visualized paranasal sinuses and mastoids are clear. Other: No acute orbit or scalp soft tissue finding. IMPRESSION: No acute intracranial abnormality. Chronic small-vessel disease suspected but not significantly changed by CT since 2014. Electronically Signed   By: Genevie Ann M.D.   On: 10/18/2019 21:30   CT Cervical Spine Wo Contrast  Result Date: 10/18/2019 CLINICAL DATA:  74 year old male with altered mental status, confusion, ataxia. EXAM: CT CERVICAL SPINE WITHOUT CONTRAST TECHNIQUE: Multidetector CT imaging of the cervical spine was performed without intravenous contrast. Multiplanar CT image reconstructions were also generated. COMPARISON:  Cervical spine radiographs 06/26/2014. chest CT 09/01/2018 FINDINGS: Alignment: Stable chronic straightening of cervical lordosis. Cervicothoracic junction alignment is within normal limits. Bilateral posterior element alignment is within normal limits. Skull base and vertebrae: Visualized skull base is intact. No atlanto-occipital dissociation. No acute osseous abnormality identified. Chronic interbody arthrodesis or ankylosis at C5-C6. Soft tissues and spinal canal: No prevertebral fluid or swelling. No visible canal hematoma. Calcified atherosclerosis in the neck and at the skull base. Otherwise negative noncontrast neck soft tissues. Disc levels: Moderate right occipital condyle-C1 degeneration with joint space loss, subchondral sclerosis and small subchondral cysts. Multilevel upper cervical facet arthropathy. Lower cervical chronic disc and endplate degeneration. Upper chest: Chronic  left apical lung scarring appears stable from the 09/01/2018 CT. Visible upper thoracic levels appear intact. IMPRESSION: 1. No acute osseous abnormality in the cervical spine. 2. Advanced skull base and cervical spine  degeneration. Chronic C5-C6 interbody ankylosis. 3. Left apical lung scarring appears stable from the 09/01/2018 Chest CT. Electronically Signed   By: Genevie Ann M.D.   On: 10/18/2019 21:35   MR Brain W and Wo Contrast  Result Date: 10/18/2019 CLINICAL DATA:  74 year old male with altered mental status, confusion, ataxia. EXAM: MRI HEAD WITHOUT AND WITH CONTRAST TECHNIQUE: Multiplanar, multiecho pulse sequences of the brain and surrounding structures were obtained without and with intravenous contrast. CONTRAST:  10m GADAVIST GADOBUTROL 1 MMOL/ML IV SOLN COMPARISON:  Head and cervical spine CT earlier today. FINDINGS: Brain: No restricted diffusion to suggest acute infarction. No midline shift, mass effect, evidence of mass lesion, ventriculomegaly, extra-axial collection or acute intracranial hemorrhage. Cervicomedullary junction and pituitary are within normal limits. Widespread and confluent bilateral cerebral white matter T2 and FLAIR hyperintensity. Similar extensive T2 and FLAIR heterogeneity throughout the bilateral deep gray nuclei. Evidence of chronic lacunar infarcts in the lateral left pons and left cerebellum. Scattered chronic microhemorrhages in the bilateral thalami, right temporal and occipital lobes, left cerebellum. No definite cortical encephalomalacia. No abnormal enhancement identified. No dural thickening. Vascular: Major intracranial vascular flow voids are preserved. Skull and upper cervical spine: Negative visible cervical spine. Visualized bone marrow signal is within normal limits. Sinuses/Orbits: Negative orbits. Paranasal sinuses are clear. Other: Mastoids are well pneumatized. Grossly normal visible internal auditory structures. Scalp and face soft tissues appear negative. IMPRESSION: 1. Evidence of chronically advanced small vessel disease but no acute intracranial abnormality is identified. 2. Chronic microhemorrhages scattered in the deep gray nuclei, right hemisphere, left  cerebellum. Electronically Signed   By: HGenevie AnnM.D.   On: 10/18/2019 23:54   [place]  Result Date: 10/19/2019 CLINICAL DATA:  TIA. History of stroke, hyperlipidemia and diabetes. EXAM: BILATERAL CAROTID DUPLEX ULTRASOUND TECHNIQUE: GPearline Cablesscale imaging, color Doppler and duplex ultrasound were performed of bilateral carotid and vertebral arteries in the neck. COMPARISON:  06/29/2012 FINDINGS: Criteria: Quantification of carotid stenosis is based on velocity parameters that correlate the residual internal carotid diameter with NASCET-based stenosis levels, using the diameter of the distal internal carotid lumen as the denominator for stenosis measurement. The following velocity measurements were obtained: RIGHT ICA: 95/9 cm/sec CCA: 522/0cm/sec SYSTOLIC ICA/CCA RATIO:  1.8 ECA: 73 cm/sec LEFT ICA: 89/8 cm/sec CCA: 625/4cm/sec SYSTOLIC ICA/CCA RATIO:  1.4 ECA: 70 cm/sec RIGHT CAROTID ARTERY: There is a minimal amount of eccentric echogenic plaque involving the distal aspect of the right common carotid artery (image 16). There is a moderate amount of eccentric echogenic plaque within the right carotid bulb (image 24). There is a large amount of eccentric echogenic shadowing plaque involving the origin and proximal aspects of the right internal carotid artery (image 33), progressed compared to the 06/2012 examination, though not resulting in elevated peak systolic velocities within the interrogated course of the right internal carotid artery to suggest a hemodynamically significant stenosis. RIGHT VERTEBRAL ARTERY:  Antegrade flow LEFT CAROTID ARTERY: There is moderate amount of eccentric echogenic plaque involving the proximal (image 45), mid (image 49) and distal (image 53) aspects of the left common carotid artery. There is a large amount of echogenic densely shadowing plaque within the left carotid bulb (image 58), extending to involve the origin and proximal aspects of  the left  internal carotid artery (image 66, progressed compared to the 06/2012 examination, though not resulting in elevated peak systolic velocities within the interrogated course of the left internal carotid artery to suggest a hemodynamically significant stenosis. LEFT VERTEBRAL ARTERY:  Antegrade Flow IMPRESSION: Large amount of bilateral atherosclerotic plaque, progressed compared to the 06/2012 examination, though not definitely resulting in a hemodynamically significant stenosis within either internal carotid artery. If clinical concern persists, further evaluation could be performed with CTA as clinically indicated. Electronically Signed   By: Sandi Mariscal M.D.   On: 10/19/2019 13:13   ECHOCARDIOGRAM COMPLETE  Result Date: 10/19/2019    ECHOCARDIOGRAM REPORT   Patient Name:   Victor Fox Date of Exam: 10/19/2019 Medical Rec #:  [id number]      Height:       72.0 in Accession #:    [id number]     Weight:       200.0 lb Date of Birth:  06/03/1946       BSA:          2.13 m Patient Age:    70 years       BP:           162/72 mmHg Patient Gender: M              HR:           52 bpm. Exam Location:  [facility] Procedure: 2D Echo, Color Doppler and Cardiac Doppler Indications:     G45.9 TIA  History:         Patient has prior history of Echocardiogram examinations. CAD,                  Aortic Valve Disease; Risk Factors:Current Smoker,                  Hypertension, Diabetes and Dyslipidemia.  Sonographer:     Charmayne Sheer RDCS (AE) Referring Phys:  [id number] Arvella Merles MANSY Diagnosing Phys: Kate Sable MD  Sonographer Comments: No subcostal window. Image acquisition challenging due to uncooperative patient. IMPRESSIONS  1. Left ventricular ejection fraction, by estimation, is 60 to 65%. The left ventricle has normal function. The left ventricle has no regional wall motion abnormalities. There is mild left ventricular hypertrophy. Left ventricular diastolic parameters were normal.  2. Right ventricular systolic function is  normal. The right ventricular size is normal.  3. The mitral valve is degenerative. No evidence of mitral valve regurgitation.  4. The aortic valve is tricuspid. Aortic valve regurgitation is not visualized. Moderate aortic valve stenosis.  5. Pulmonic valve regurgitation not assessed. FINDINGS  Left Ventricle: Left ventricular ejection fraction, by estimation, is 60 to 65%. The left ventricle has normal function. The left ventricle has no regional wall motion abnormalities. The left ventricular internal cavity size was normal in size. There is  mild left ventricular hypertrophy. Left ventricular diastolic parameters were normal. Right Ventricle: The right ventricular size is normal. Right vetricular wall thickness was not assessed. Right ventricular systolic function is normal. Left Atrium: Left atrial size was normal in size. Right Atrium: Right atrial size was normal in size. Pericardium: There is no evidence of pericardial effusion. Mitral Valve: The mitral valve is degenerative in appearance. Mild mitral annular calcification. No evidence of mitral valve regurgitation. MV peak gradient, 4.1 mmHg. The mean mitral valve gradient is 2.0 mmHg. Tricuspid Valve: The tricuspid valve is normal in structure. Tricuspid valve regurgitation is not demonstrated. Aortic Valve: Aortic valve DVI  is 0.34. The aortic valve is tricuspid. Aortic valve regurgitation is not visualized. Moderate aortic stenosis is present. There is severe calcifcation of the aortic valve. Aortic valve mean gradient measures 13.0 mmHg. Aortic valve peak gradient measures 26.4 mmHg. Aortic valve area, by VTI measures 1.4 cm Pulmonic Valve: The pulmonic valve was not well visualized. Pulmonic valve regurgitation not assessed. Aorta: The aortic root is normal in size and structure. IAS/Shunts: No atrial level shunt detected by color flow Doppler.  LEFT VENTRICLE PLAX 2D LVIDd:         4.81 cm  Diastology LVIDs:         2.57 cm  LV e' lateral:   5.66  cm/s LV PW:         1.13 cm  LV E/e' lateral: 14.6 LV IVS:        1.04 cm  LV e' medial:    6.53 cm/s LVOT diam:     2.30 cm  LV E/e' medial:  12.7 LV SV:         76.86 ml LV SV Index:   38.94 LVOT Area:     4.15 cm  RIGHT VENTRICLE RV Basal diam:  3.58 cm LEFT ATRIUM             Index       RIGHT ATRIUM           Index LA diam:        3.70 cm 1.74 cm/m  RA Area:     16.30 cm LA Vol (A2C):   47.5 ml 22.29 ml/m RA Volume:   42.70 ml  20.04 ml/m LA Vol (A4C):   30.8 ml 14.46 ml/m LA Biplane Vol: 38.7 ml 18.16 ml/m  AORTIC VALVE AV Area (Vmax):    1.45 cm AV Area (Vmean):   1.84 cm AV Area (VTI):     1.81 cm AV Vmax:           257.00 cm/s AV Vmean:          134.000 cm/s AV VTI:            0.425 m AV Peak Grad:      26.4 mmHg AV Mean Grad:      13.0 mmHg LVOT Vmax:         89.40 cm/s LVOT Vmean:        59.300 cm/s LVOT VTI:          0.185 m LVOT/AV VTI ratio: 0.44  AORTA Ao Root diam: 3.50 cm MITRAL VALVE MV Area (PHT): 2.80 cm    SHUNTS MV Peak grad:  4.1 mmHg    Systemic VTI:  0.18 m MV Mean grad:  2.0 mmHg    Systemic Diam: 2.30 cm MV Vmax:       1.01 m/s MV Vmean:      60.4 cm/s MV Decel Time: 271 msec MV E velocity: 82.90 cm/s MV A velocity: 96.00 cm/s MV E/A ratio:  0.86 Kate Sable MD Electronically signed by Kate Sable MD Signature Date/Time: 10/19/2019/1:34:16 PM    Final     Microbiology: Recent Results (from the past 240 hour(s))  Respiratory Panel by RT PCR (Flu A&B, Covid) - Nasopharyngeal Swab     Status: None   Collection Time: 10/18/19  9:06 PM   Specimen: Nasopharyngeal Swab  Result Value Ref Range Status   SARS Coronavirus 2 by RT PCR NEGATIVE NEGATIVE Final    Comment: (NOTE) SARS-CoV-2 target nucleic acids are NOT DETECTED. The SARS-CoV-2  RNA is generally detectable in upper respiratoy specimens during the acute phase of infection. The lowest concentration of SARS-CoV-2 viral copies this assay can detect is 131 copies/mL. A negative result does not preclude  SARS-Cov-2 infection and should not be used as the sole basis for treatment or other patient management decisions. A negative result may occur with  improper specimen collection/handling, submission of specimen other than nasopharyngeal swab, presence of viral mutation(s) within the areas targeted by this assay, and inadequate number of viral copies (<131 copies/mL). A negative result must be combined with clinical observations, patient history, and epidemiological information. The expected result is Negative. Fact Sheet for Patients:  PinkCheek.be Fact Sheet for Healthcare Providers:  GravelBags.it This test is not yet ap proved or cleared by the [place] FDA and  has been authorized for detection and/or diagnosis of SARS-CoV-2 by FDA under an Emergency Use Authorization (EUA). This EUA will remain  in effect (meaning this test can be used) for the duration of the COVID-19 declaration under Section 564(b)(1) of the Act, 21 U.S.C. section 360bbb-3(b)(1), unless the authorization is terminated or revoked sooner.    Influenza A by PCR NEGATIVE NEGATIVE Final   Influenza B by PCR NEGATIVE NEGATIVE Final    Comment: (NOTE) The Xpert Xpress SARS-CoV-2/FLU/RSV assay is intended as an aid in  the diagnosis of influenza from Nasopharyngeal swab specimens and  should not be used as a sole basis for treatment. Nasal washings and  aspirates are unacceptable for Xpert Xpress SARS-CoV-2/FLU/RSV  testing. Fact Sheet for Patients: PinkCheek.be Fact Sheet for Healthcare Providers: GravelBags.it This test is not yet approved or cleared by the [place] FDA and  has been authorized for detection and/or diagnosis of SARS-CoV-2 by  FDA under an Emergency Use Authorization (EUA). This EUA will remain  in effect (meaning this test can be used) for the duration of the  Covid-19  declaration under Section 564(b)(1) of the Act, 21  U.S.C. section 360bbb-3(b)(1), unless the authorization is  terminated or revoked. Performed at [facility], [address] [place],  [address]      Labs: Basic Metabolic Panel: Recent Labs  Lab 10/18/19 2106 10/20/19 0509  NA 138 139  K 4.1 3.7  CL 101 106  CO2 28 27  GLUCOSE 240* 187*  BUN 12 13  CREATININE 0.97 0.81  CALCIUM 9.4 8.9  MG  --  2.0   Liver Function Tests: Recent Labs  Lab 10/18/19 2106  AST 17  ALT 15  ALKPHOS 74  BILITOT 0.7  PROT 6.7  ALBUMIN 3.7   CBC: Recent Labs  Lab 10/18/19 2106 10/20/19 0509  WBC 8.8 8.3  NEUTROABS 4.7  --   HGB 15.4 15.0  HCT 45.0 43.5  MCV 84.9 84.6  PLT 195 172    CBG: Recent Labs  Lab 10/19/19 1836 10/19/19 2109 10/19/19 2218 10/20/19 0732 10/20/19 1148  GLUCAP 289* 174* 179* 182* 309*    Principal Problem:   Acute metabolic encephalopathy Active Problems:   Hypertensive urgency   Acute encephalopathy   Time coordinating discharge: 35 minutes  Signed:  Murray Hodgkins, MD  [facility]  10/20/2019, 2:00 PM

## 2019-10-20 NOTE — Plan of Care (Signed)
  Problem: Nutrition: Goal: Adequate nutrition will be maintained Outcome: Progressing   Problem: Coping: Goal: Level of anxiety will decrease Outcome: Progressing   Problem: Elimination: Goal: Will not experience complications related to bowel motility Outcome: Progressing Goal: Will not experience complications related to urinary retention Outcome: Progressing   Problem: Pain Managment: Goal: General experience of comfort will improve Outcome: Progressing   Problem: Safety: Goal: Ability to remain free from injury will improve Outcome: Progressing   Problem: Skin Integrity: Goal: Risk for impaired skin integrity will decrease Outcome: Progressing   Problem: Education: Goal: Knowledge of secondary prevention will improve Outcome: Progressing Goal: Knowledge of patient specific risk factors addressed and post discharge goals established will improve Outcome: Progressing   Problem: Health Behavior/Discharge Planning: Goal: Ability to manage health-related needs will improve Outcome: Progressing   Problem: Self-Care: Goal: Ability to communicate needs accurately will improve Outcome: Progressing   Problem: Nutrition: Goal: Risk of aspiration will decrease Outcome: Progressing   Problem: Ischemic Stroke/TIA Tissue Perfusion: Goal: Complications of ischemic stroke/TIA will be minimized Outcome: Progressing

## 2019-10-20 NOTE — TOC Progression Note (Signed)
Transition of Care [facility]) - Progression Note    Patient Details  Name: Victor Fox MRN: [id number] Date of Birth: Mar 30, 1946  Transition of Care [facility]) CM/SW [place], RN Phone Number: 10/20/2019, 2:05 PM  Clinical Narrative:   Provided the patient with a Taxi voucher to his residence.  He has no other means of transportation to get home and is discharged, no additional needs         Expected Discharge Plan and Services           Expected Discharge Date: 10/20/19                                     Social Determinants of Health (SDOH) Interventions    Readmission Risk Interventions No flowsheet data found.

## 2019-11-08 ENCOUNTER — Ambulatory Visit: Payer: Medicare HMO

## 2019-11-10 NOTE — Progress Notes (Signed)
This encounter was created in error - please disregard.

## 2020-01-08 ENCOUNTER — Other Ambulatory Visit: Payer: Self-pay

## 2020-01-08 ENCOUNTER — Emergency Department: Payer: Medicare Other

## 2020-01-08 ENCOUNTER — Encounter: Payer: Self-pay | Admitting: Emergency Medicine

## 2020-01-08 ENCOUNTER — Emergency Department
Admission: EM | Admit: 2020-01-08 | Discharge: 2020-01-08 | Disposition: A | Discharge status: Home / Self Care | Payer: Medicare Other | Attending: Emergency Medicine | Admitting: Emergency Medicine

## 2020-01-08 DIAGNOSIS — I10 Essential (primary) hypertension: Secondary | ICD-10-CM | POA: Diagnosis not present

## 2020-01-08 DIAGNOSIS — Z7982 Long term (current) use of aspirin: Secondary | ICD-10-CM | POA: Diagnosis not present

## 2020-01-08 DIAGNOSIS — R739 Hyperglycemia, unspecified: Secondary | ICD-10-CM

## 2020-01-08 DIAGNOSIS — R42 Dizziness and giddiness: Secondary | ICD-10-CM | POA: Diagnosis not present

## 2020-01-08 DIAGNOSIS — I2510 Atherosclerotic heart disease of native coronary artery without angina pectoris: Secondary | ICD-10-CM | POA: Insufficient documentation

## 2020-01-08 DIAGNOSIS — Z794 Long term (current) use of insulin: Secondary | ICD-10-CM | POA: Insufficient documentation

## 2020-01-08 DIAGNOSIS — Z79899 Other long term (current) drug therapy: Secondary | ICD-10-CM | POA: Insufficient documentation

## 2020-01-08 DIAGNOSIS — H81399 Other peripheral vertigo, unspecified ear: Secondary | ICD-10-CM

## 2020-01-08 DIAGNOSIS — E1165 Type 2 diabetes mellitus with hyperglycemia: Secondary | ICD-10-CM | POA: Insufficient documentation

## 2020-01-08 DIAGNOSIS — R202 Paresthesia of skin: Secondary | ICD-10-CM | POA: Diagnosis not present

## 2020-01-08 DIAGNOSIS — Z87891 Personal history of nicotine dependence: Secondary | ICD-10-CM | POA: Insufficient documentation

## 2020-01-08 LAB — COMPREHENSIVE METABOLIC PANEL
ALT: 17 U/L (ref 0–44)
AST: 18 U/L (ref 15–41)
Albumin: 3.7 g/dL (ref 3.5–5.0)
Alkaline Phosphatase: 77 U/L (ref 38–126)
Anion gap: 8 (ref 5–15)
BUN: 11 mg/dL (ref 8–23)
CO2: 27 mmol/L (ref 22–32)
Calcium: 8.9 mg/dL (ref 8.9–10.3)
Chloride: 101 mmol/L (ref 98–111)
Creatinine, Ser: 0.84 mg/dL (ref 0.61–1.24)
GFR calc Af Amer: >60 mL/min (ref >60)
GFR calc non Af Amer: >60 mL/min (ref >60)
Glucose, Bld: 395 mg/dL — ABNORMAL HIGH (ref 70–99)
Potassium: 4.2 mmol/L (ref 3.5–5.1)
Sodium: 136 mmol/L (ref 135–145)
Total Bilirubin: 1.2 mg/dL (ref 0.3–1.2)
Total Protein: 6.5 g/dL (ref 6.5–8.1)

## 2020-01-08 LAB — CBC
HCT: 43.9 % (ref 39.0–52.0)
Hemoglobin: 15.1 g/dL (ref 13.0–17.0)
MCH: 29.4 pg (ref 26.0–34.0)
MCHC: 34.4 g/dL (ref 30.0–36.0)
MCV: 85.6 fL (ref 80.0–100.0)
Platelets: 166 10*3/uL (ref 150–400)
RBC: 5.13 MIL/uL (ref 4.22–5.81)
RDW: 12.9 % (ref 11.5–15.5)
WBC: 7.1 10*3/uL (ref 4.0–10.5)
nRBC: 0 % (ref 0.0–0.2)

## 2020-01-08 LAB — DIFFERENTIAL
Abs Immature Granulocytes: 0.02 10*3/uL (ref 0.00–0.07)
Basophils Absolute: 0 10*3/uL (ref 0.0–0.1)
Basophils Relative: 0 %
Eosinophils Absolute: 0.1 10*3/uL (ref 0.0–0.5)
Eosinophils Relative: 1 %
Immature Granulocytes: 0 %
Lymphocytes Relative: 22 %
Lymphs Abs: 1.6 10*3/uL (ref 0.7–4.0)
Monocytes Absolute: 0.5 10*3/uL (ref 0.1–1.0)
Monocytes Relative: 7 %
Neutro Abs: 4.9 10*3/uL (ref 1.7–7.7)
Neutrophils Relative %: 70 %

## 2020-01-08 LAB — GLUCOSE, CAPILLARY
Glucose-Capillary: 360 mg/dL — ABNORMAL HIGH (ref 70–99)
Glucose-Capillary: 237 mg/dL — ABNORMAL HIGH (ref 70–99)

## 2020-01-08 LAB — PROTIME-INR
INR: 1 (ref 0.8–1.2)
Prothrombin Time: 13 seconds (ref 11.4–15.2)

## 2020-01-08 LAB — APTT: aPTT: 28 seconds (ref 24–36)

## 2020-01-08 IMAGING — CT CT HEAD W/O CM
3 series · 15 of 47 positions shown, 18 images · non-contrast
Comparison: Carotid artery duplex [DATE], brain MRI [DATE]

CLINICAL DATA: Neuro deficit, subacute, possible stroke. Additional
history provided: Patient presents with dizziness for 1 month,
bilateral hand numbness for several weeks.

EXAM:
CT HEAD WITHOUT CONTRAST
TECHNIQUE: Contiguous axial images were obtained from the base of the skull
through the vertex without intravenous contrast.

[Series 2: head wo · axial · 0.43mm/px · z∈[-159,-34]mm · 9 of 31 slices shown, 12 images]
[im 3/31  brain]
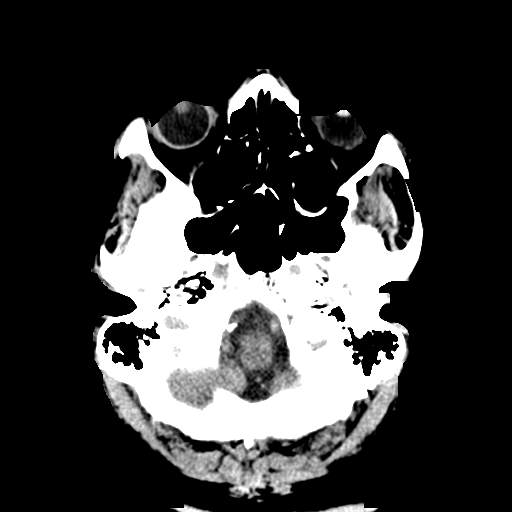
[im 3/31  bone]
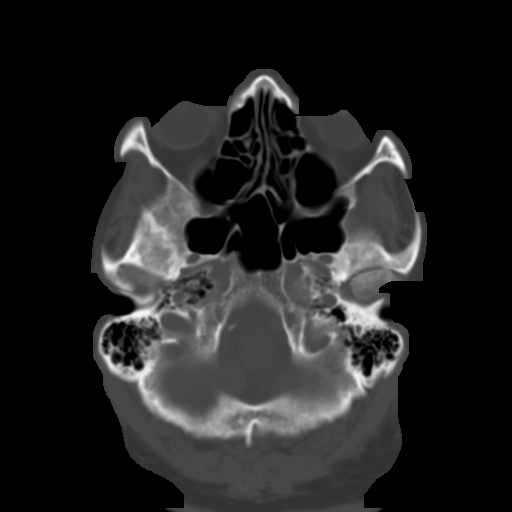
[im 6/31  brain]
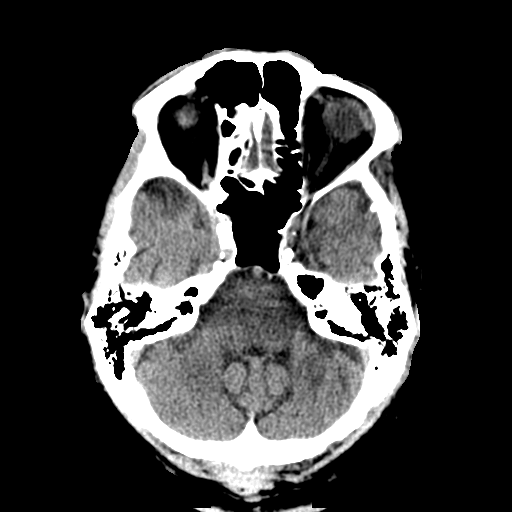
[im 9/31  brain]
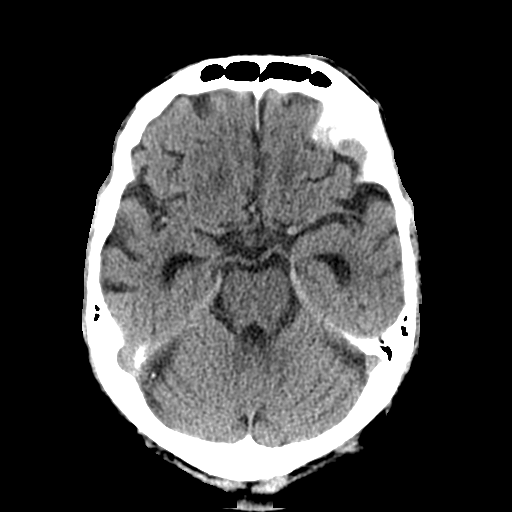
[im 12/31  brain]
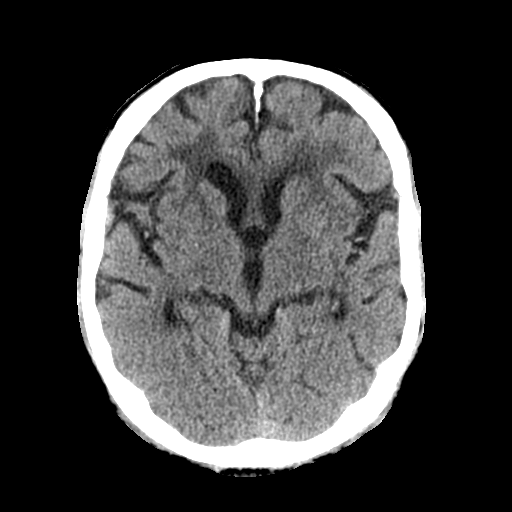
[im 16/31  brain]
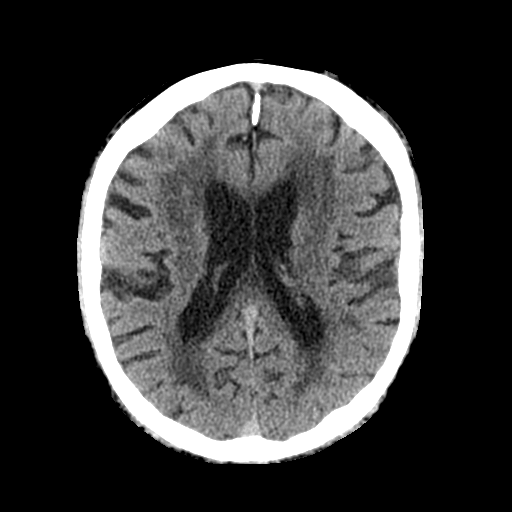
[im 16/31  bone]
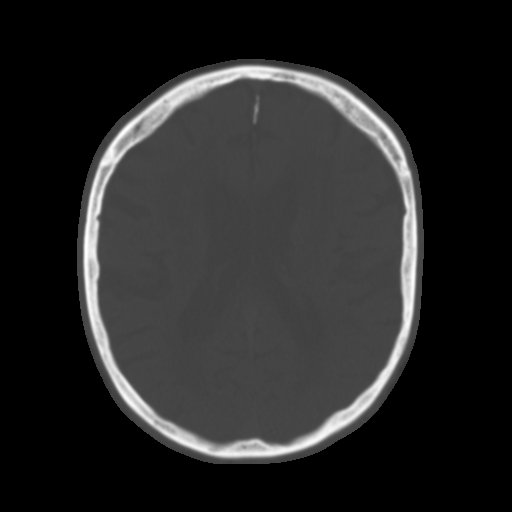
[im 19/31  brain]
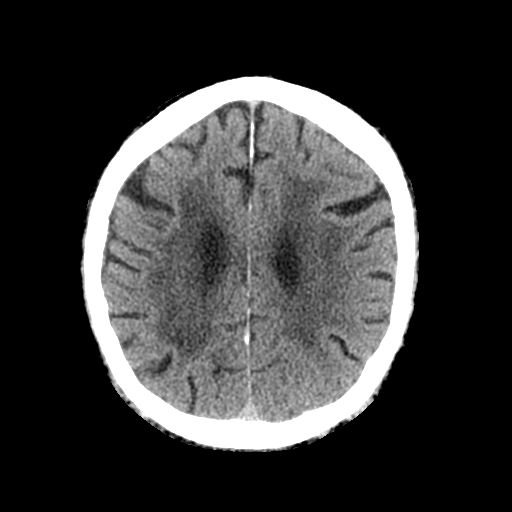
[im 22/31  brain]
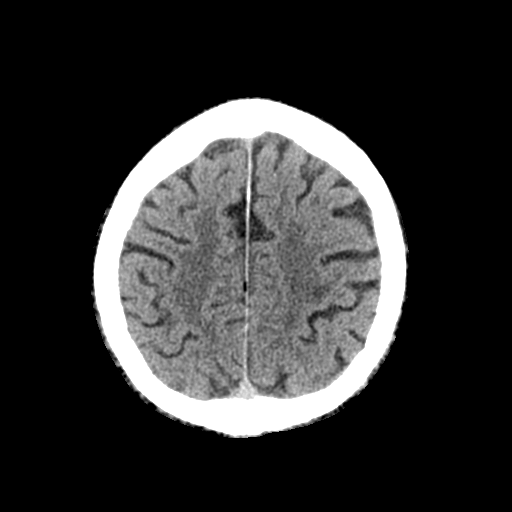
[im 25/31  brain]
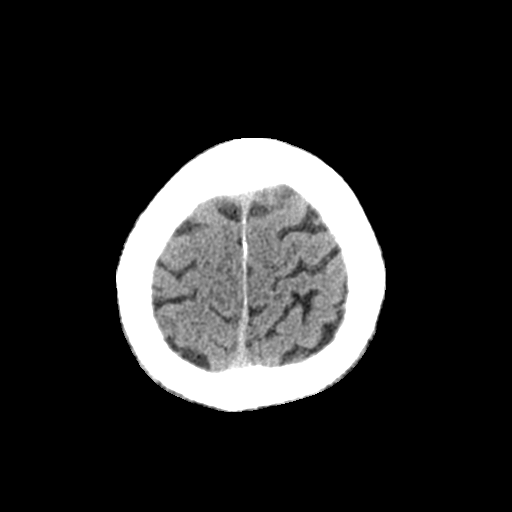
[im 28/31  brain]
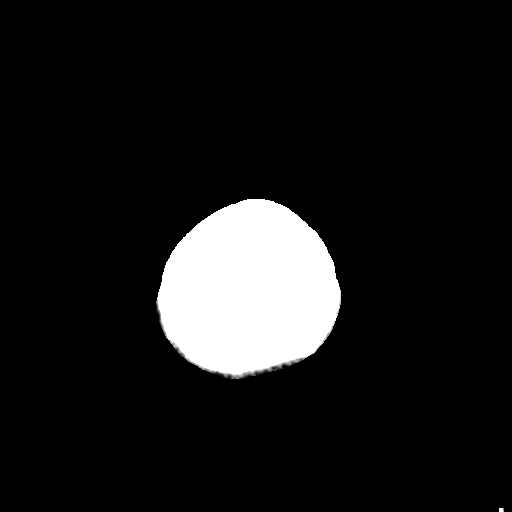
[im 28/31  bone]
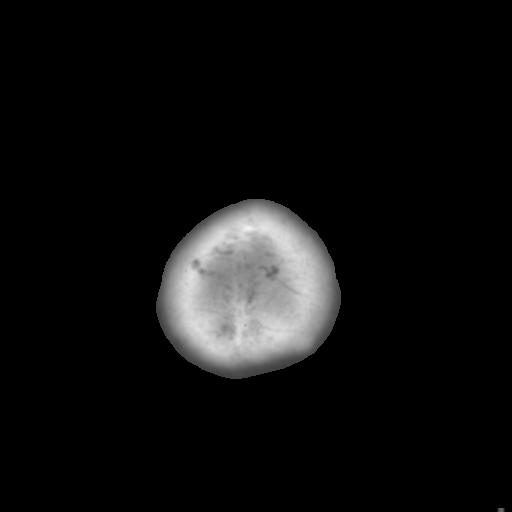

[Series 4: coronal soft tissue · coronal · 0.29mm/px · 3 of 65 slices shown]
[im 22/65  brain]
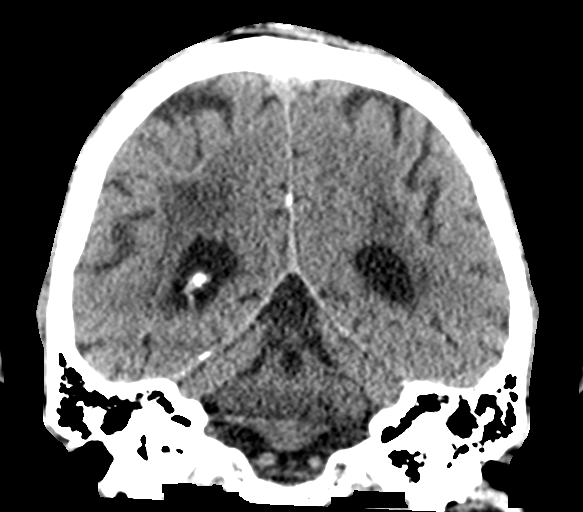
[im 29/65  brain]
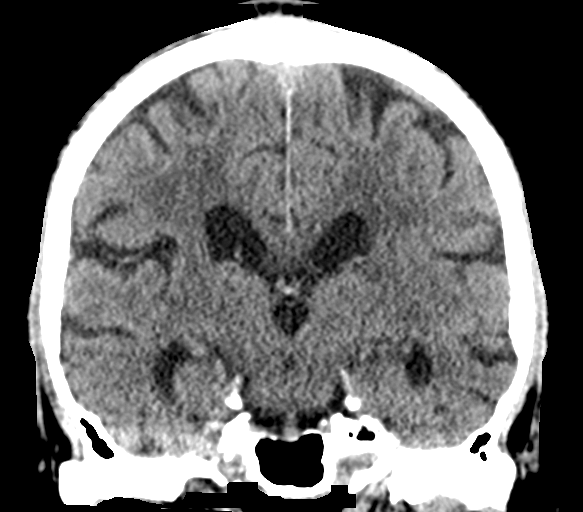
[im 36/65  brain]
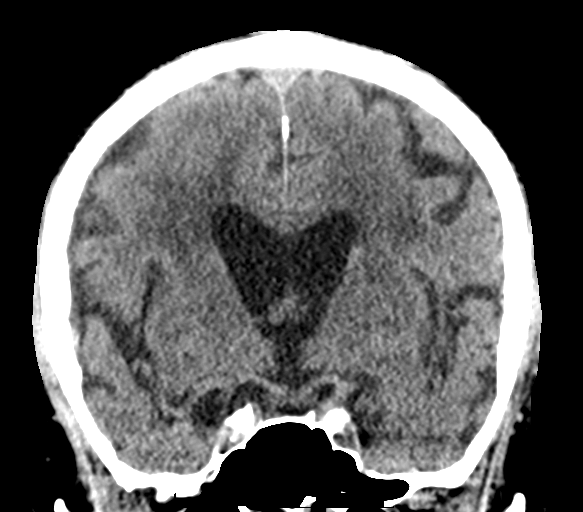

[Series 5: sagittal soft tissue · sagittal · 0.30mm/px · 3 of 57 slices shown]
[im 19/57  brain]
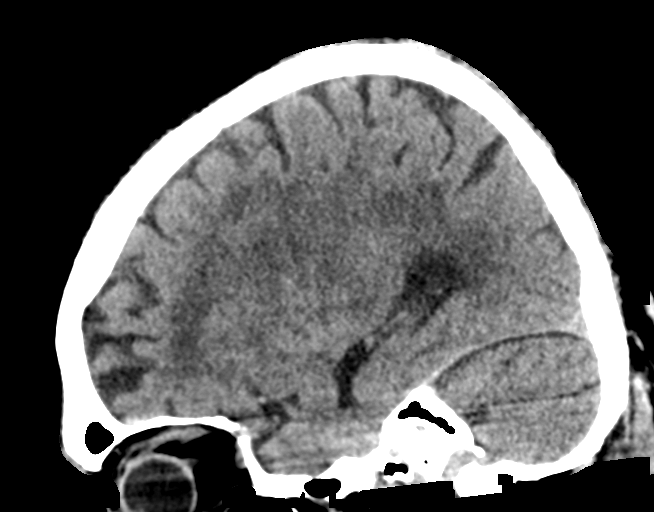
[im 29/57  brain]
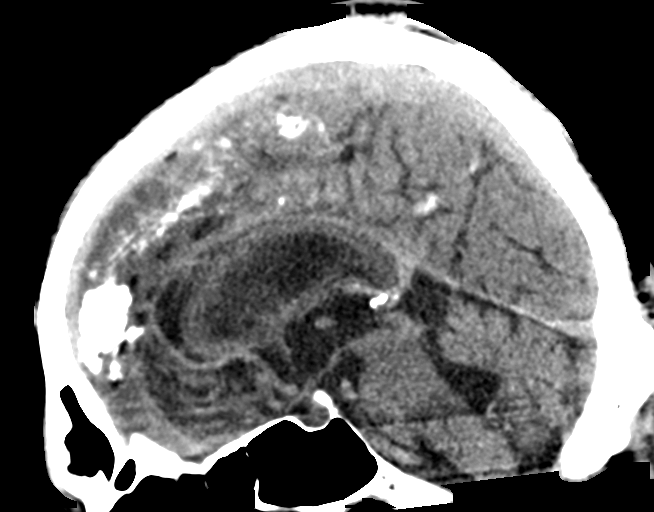
[im 38/57  brain]
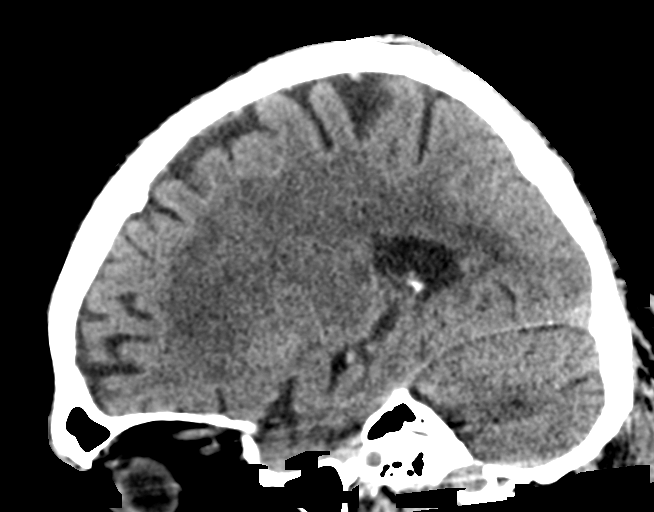

[15 of 47 positions shown; findings below may reference images not displayed]

FINDINGS: Brain:

Advanced ill-defined hypoattenuation within the cerebral white
matter and deep gray nuclei which is nonspecific, but consistent
with chronic small vessel ischemic disease. A chronic lacunar
infarct within the left pons was better appreciated on prior MRI
[DATE]. Redemonstrated chronic lacunar infarcts within the left
cerebellum.

Stable, mild generalized parenchymal atrophy.

There is no acute intracranial hemorrhage.

No demarcated cortical infarct.

No extra-axial fluid collection.

No evidence of intracranial mass.

No midline shift.

Vascular: No hyperdense vessel.  Atherosclerotic calcifications.

Skull: Normal. Negative for fracture or focal suspicious lesion.

Sinuses/Orbits: Visualized orbits show no acute finding. Mild
ethmoid sinus mucosal thickening. No significant mastoid effusion.
IMPRESSION: 1. No evidence of acute intracranial abnormality.
2. Advanced chronic small vessel ischemic changes within the
cerebral white matter and deep gray nuclei. Remote lacunar infarcts
within the left pons and left cerebellum.
3. Stable, mild generalized parenchymal atrophy.
4. Mild ethmoid sinus mucosal thickening.

## 2020-01-08 MED ORDER — SODIUM CHLORIDE 0.9 % IV BOLUS
500.0000 mL | Freq: Once | INTRAVENOUS | Status: AC
  Administered 2020-01-08: 500 mL via INTRAVENOUS

## 2020-01-08 MED ORDER — INSULIN ASPART 100 UNIT/ML ~~LOC~~ SOLN
8.0000 [IU] | Freq: Once | SUBCUTANEOUS | Status: AC
  Administered 2020-01-08: 8 [IU] via INTRAVENOUS
  Filled 2020-01-08: qty 1

## 2020-01-08 MED ORDER — MECLIZINE HCL 25 MG PO TABS: 25.0000 mg | ORAL_TABLET | Freq: Three times a day (TID) | ORAL | 0 refills | Status: AC | PRN

## 2020-01-08 MED ORDER — MECLIZINE HCL 25 MG PO TABS
25.0000 mg | ORAL_TABLET | Freq: Once | ORAL | Status: AC
  Administered 2020-01-08: 25 mg via ORAL
  Filled 2020-01-08: qty 1

## 2020-01-08 NOTE — Discharge Instructions (Signed)
You may take the meclizine as needed for dizziness.  Make an appointment to follow-up with your primary care doctor in the next 1 to 2 weeks.  Make sure you have a working glucometer and check your sugar frequently especially over the next several days.  Return to the ER immediately for new, worsening, or persistent severe dizziness, lightheadedness, vomiting, severe headache, weakness or numbness, difficulty speaking, vision changes, or any other new or worsening symptoms that concern you.

## 2020-01-08 NOTE — ED Triage Notes (Signed)
Pt presents to ED via POV with c/o dizziness x 1 month and bilateral hand numbness x several weeks. Pt states has not been evaluated for it. Pt states dizziness is intermittently worse when he turns his head or turns his head.

## 2020-01-08 NOTE — ED Provider Notes (Signed)
[facility] Emergency Department Provider Note ____________________________________________   First MD Initiated Contact with Patient 01/08/20 1152     (approximate)  I have reviewed the triage vital signs and the nursing notes.   HISTORY  Chief Complaint Dizziness    HPI Victor Fox is a 74 y.o. male with PMH as noted below who presents with dizziness over the last several weeks, gradual onset, occurring intermittently, and described both as lightheadedness and feeling like he is going to pass out, but also with a movement sensation or loss of balance.  It is worst when he is moving around or changing position.  He states he came in today because he had to drive and felt more dizzy than he had previously.  He also reports some tingling to bilateral hands over the last several weeks, but no weakness or difficulty using hands.  Denies any other new weakness or numbness.  He states he misplaced his glucometer a few days ago, and so has not been able to check his sugar in the last several days, however he is still using his insulin and Metformin.  Past Medical History:  Diagnosis Date  . Arthritis   . Benign neoplasm of unspecified site   . Carotid artery disease ([place])    a. carotid duplex 05/2015: less tahn 39% stenosis bilaterally, stable over the past year, recommend repeat imaging in 2 years (05/2017)  . Compression fracture    a. L2  . CVA (cerebral vascular accident) ([place]) 06/2012  . Diabetes mellitus without complication ([place])   . Heart murmur    a. echo 06/2012: EF >55%, LVH, aortic valve sclerosis  . Hyperlipidemia   . Hypertension   . Neuropathy   . Pneumonia   . Tobacco abuse    a. smoked x 25 years  . Vitamin D deficiency     Patient Active Problem List   Diagnosis Date Noted  . Acute metabolic encephalopathy 00/37/0488  . Hypertensive urgency 10/19/2019  . Acute encephalopathy 10/19/2019  . Pain due to onychomycosis of toenails of  both feet 02/21/2019  . Adenomatous polyp 11/05/2018  . Blood pressure elevated 11/05/2018  . Neuropathy 11/05/2018  . Pneumococcal vaccination given 11/05/2018  . Pulmonary nodules 10/08/2018  . Coronary artery disease 10/08/2018  . CAP (community acquired pneumonia) 09/01/2018  . Overweight (BMI 25.0-29.9) 03/10/2018  . 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 Benign neoplasm 01/19/2018  . Onychomycosis due to dermatophyte 01/19/2018  . 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 Neck pain 01/19/2018  . Vitamin D deficiency 01/19/2018  . Aortic stenosis, mild 01/05/2018  . 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 Vertigo 12/17/2017  . Uncontrolled diabetes mellitus with complication, with long-term current use of insulin ([place]) 12/17/2017  . Hyperlipidemia 04/14/2016  . Pain and swelling of left wrist 07/10/2015  . Carotid artery disease ([place])   . Heart murmur 10/03/2013  . HTN (hypertension) 10/03/2013  . CVA (cerebral vascular accident) ([place]) 10/03/2013  . Carotid artery stenosis 10/03/2013    Past Surgical History:  Procedure Laterality Date  . ARTHRODESIS    . BACK SURGERY     x 2  . discectomy    . trigger release      Prior to Admission medications   Medication Sig Start Date End Date Taking? Authorizing Provider  amLODipine (NORVASC) 10 MG tablet Take 1 tablet (10 mg total) by mouth daily. Appointment needed please for further refills 10/20/19   Samuella Cota, MD  aspirin EC 81 MG tablet Take 81 mg by mouth daily.    [provider]  atorvastatin (LIPITOR) 40 MG tablet Take 1 tablet (40 mg total) by mouth at bedtime. 10/20/19   Samuella Cota, MD  B-D UF III MINI PEN NEEDLES 31G X 5 MM MISC use once daily 01/28/17   Roselee Nova, MD  blood glucose meter kit and supplies KIT Dispense based on patient and insurance preference. Use up to four times daily as directed. (FOR ICD-9 250.00, 250.01). 11/23/18   Poulose, Bethel Born,  NP  enalapril (VASOTEC) 20 MG tablet Take 1 tablet (20 mg total) by mouth daily. 10/20/19   Samuella Cota, MD  Insulin Isophane & Regular Human (NOVOLIN 70/30 FLEXPEN) (70-30) 100 UNIT/ML PEN Inject 15 Units into the skin in the morning and at bedtime. 10/20/19   Samuella Cota, MD  Lancets [facility] DELICA PLUS [id number]) MISC USE QID UTD 11/23/18   [provider]  meclizine (ANTIVERT) 25 MG tablet Take 1 tablet (25 mg total) by mouth 3 (three) times daily as needed for dizziness. 01/08/20   Arta Silence, MD  ONE TOUCH ULTRA TEST test strip USE QID UTD 11/23/18   [provider]    Allergies Patient has no known allergies.  Family History  Problem Relation Age of Onset  . Heart disease Brother   . Hypertension Mother     Social History Social History   Tobacco Use  . Smoking status: Former Smoker    Packs/day: 1.00    Years: 10.00    Pack years: 10.00    Types: Cigarettes  . Smokeless tobacco: Never Used  Substance Use Topics  . Alcohol use: No  . Drug use: No    Review of Systems  Constitutional: No fever/chills. Eyes: No visual changes. ENT: No sore throat. Cardiovascular: Denies chest pain. Respiratory: Denies shortness of breath. Gastrointestinal: Positive for nausea. Genitourinary: Negative for dysuria.  Musculoskeletal: Negative for back pain. Skin: Negative for rash. Neurological: Negative for headaches, focal weakness or numbness.   ____________________________________________   PHYSICAL EXAM:  VITAL SIGNS: ED Triage Vitals  Enc Vitals Group     BP 01/08/20 0952 (!) 175/60     Pulse Rate 01/08/20 0952 69     Resp 01/08/20 0952 18     Temp 01/08/20 0952 98.3 F (36.8 C)     Temp Source 01/08/20 0952 Oral     SpO2 01/08/20 0952 95 %     Weight --      Height --      Head Circumference --      Peak Flow --      Pain Score 01/08/20 1000 0     Pain Loc --      Pain Edu? --      Excl. in [place]? --     Constitutional:  Alert and oriented.  Relative well appearing and in no acute distress. Eyes: Conjunctivae are normal.  EOMI.  PERRLA. Head: Atraumatic. Nose: No congestion/rhinnorhea. Mouth/Throat: Mucous membranes are moist.   Neck: Normal range of motion.  Cardiovascular: Normal rate, regular rhythm.  Good peripheral circulation. Respiratory: Normal respiratory effort.  No retractions.  Gastrointestinal: No distention.  Musculoskeletal: No lower extremity edema.  Extremities warm and well perfused.  Neurologic:  Normal speech and language.  5/5 motor strength and intact sensation to all extremities.  Normal grip strength bilaterally.  No facial droop.  No pronator drift.  No ataxia on finger-to-nose. Skin:  Skin is warm and dry. No rash noted. Psychiatric: Mood and affect are normal. Speech and behavior are normal.  ____________________________________________   LABS (all labs ordered are listed, but only abnormal results are displayed)  Labs Reviewed  COMPREHENSIVE METABOLIC PANEL - Abnormal; Notable for the following components:      Result Value   Glucose, Bld 395 (*)    All other components within normal limits  GLUCOSE, CAPILLARY - Abnormal; Notable for the following components:   Glucose-Capillary 360 (*)    All other components within normal limits  GLUCOSE, CAPILLARY - Abnormal; Notable for the following components:   Glucose-Capillary 237 (*)    All other components within normal limits  PROTIME-INR  APTT  CBC  DIFFERENTIAL  CBG MONITORING, ED   ____________________________________________  EKG  ED ECG REPORT I, Arta Silence, the attending physician, personally viewed and interpreted this ECG.  Date: 01/08/2020 EKG Time: 1006 Rate: 65 Rhythm: normal sinus rhythm QRS Axis: normal Intervals: Incomplete RBBB ST/T Wave abnormalities: normal Narrative Interpretation: no evidence of acute ischemia  ____________________________________________  RADIOLOGY  CT head:  Chronic small vessel ischemic change with no acute findings  ____________________________________________   PROCEDURES  Procedure(s) performed: No  Procedures  Critical Care performed: No ____________________________________________   INITIAL IMPRESSION / ASSESSMENT AND PLAN / ED COURSE  Pertinent labs & imaging results that were available during my care of the patient were reviewed by me and considered in my medical decision making (see chart for details).  74 year old male with PMH as noted above including prior history of CVA and diabetes on insulin and Metformin presents with dizziness for the last several weeks which is described as both lightheadedness and a spinning or movement sensation, and worse with movement or changes in position.  Additionally he reports subacute bilateral hand paresthesias but no weakness or significant numbness.  I reviewed the past medical records in [place].  The patient was most recently admitted in February with hypertensive emergency and acute encephalopathy manifesting with confusion and difficulty speaking.  On exam today, the patient's vital signs are normal except for hypertension.  Neurologic exam is nonfocal.  Physical exam is otherwise as described above.  Overall the dizziness is most consistent with peripheral vertigo.  CT scan was obtained from triage and shows no evidence of acute stroke, and given that the symptoms are subacute I would expect that this would capture any significant recent ischemic event.  Given the normal neurologic exam, there is no indication for further emergent neuro work-up.  I think dehydration and hyperglycemia likely contributing.  The hand paresthesias are consistent with peripheral neuropathy related to diabetes.  Lab work-up is unremarkable except for the elevated glucose.  We will give a fluid bolus, insulin, and meclizine and reassess including orthostatic vital  signs.  ----------------------------------------- 2:43 PM on 01/08/2020 -----------------------------------------  The patient is feeling better after fluids, insulin, and meclizine.  He has no orthostatic vital sign changes.  He felt slightly dizzy going from lying down to sitting, but was able to stand up without difficulty and states he feels well.  At this time, he is stable for discharge.  He feels comfortable and would like to go home.  I counseled him on the results of the work-up.  I will prescribe meclizine and emphasized the importance of making sure he has a working glucometer (which she states he will be able to get from [place] today) and checking his sugar frequently over the next several days.  He agrees to  follow-up with his primary care doctor.  I gave the patient thorough return precautions and he expressed understanding. ____________________________________________   FINAL CLINICAL IMPRESSION(S) / ED DIAGNOSES  Final diagnoses:  Peripheral vertigo, unspecified laterality  Paresthesias  Hyperglycemia      NEW MEDICATIONS STARTED DURING THIS VISIT:  New Prescriptions   MECLIZINE (ANTIVERT) 25 MG TABLET    Take 1 tablet (25 mg total) by mouth 3 (three) times daily as needed for dizziness.     Note:  This document was prepared using Dragon voice recognition software and may include unintentional dictation errors.    Arta Silence, MD 01/08/20 [phone]

## 2021-04-17 ENCOUNTER — Encounter: Payer: Self-pay | Admitting: Emergency Medicine

## 2021-04-17 ENCOUNTER — Emergency Department: Payer: Medicare Other

## 2021-04-17 ENCOUNTER — Observation Stay: Payer: Medicare Other

## 2021-04-17 ENCOUNTER — Inpatient Hospital Stay
Admission: EM | Admit: 2021-04-17 | Discharge: 2021-05-09 | DRG: 064 | Disposition: A | Discharge status: Skilled Nursing Facility | Payer: Medicare Other | Attending: Internal Medicine | Admitting: Internal Medicine

## 2021-04-17 ENCOUNTER — Other Ambulatory Visit: Payer: Self-pay

## 2021-04-17 DIAGNOSIS — R0902 Hypoxemia: Secondary | ICD-10-CM | POA: Diagnosis not present

## 2021-04-17 DIAGNOSIS — Z7982 Long term (current) use of aspirin: Secondary | ICD-10-CM

## 2021-04-17 DIAGNOSIS — Z87891 Personal history of nicotine dependence: Secondary | ICD-10-CM | POA: Diagnosis not present

## 2021-04-17 DIAGNOSIS — I951 Orthostatic hypotension: Secondary | ICD-10-CM | POA: Diagnosis present

## 2021-04-17 DIAGNOSIS — R29700 NIHSS score 0: Secondary | ICD-10-CM | POA: Diagnosis not present

## 2021-04-17 DIAGNOSIS — E1165 Type 2 diabetes mellitus with hyperglycemia: Secondary | ICD-10-CM | POA: Diagnosis present

## 2021-04-17 DIAGNOSIS — I060 Rheumatic aortic stenosis: Secondary | ICD-10-CM | POA: Diagnosis not present

## 2021-04-17 DIAGNOSIS — I6381 Other cerebral infarction due to occlusion or stenosis of small artery: Principal | ICD-10-CM | POA: Diagnosis present

## 2021-04-17 DIAGNOSIS — I110 Hypertensive heart disease with heart failure: Secondary | ICD-10-CM | POA: Diagnosis present

## 2021-04-17 DIAGNOSIS — I6310 Cerebral infarction due to embolism of unspecified precerebral artery: Secondary | ICD-10-CM | POA: Diagnosis not present

## 2021-04-17 DIAGNOSIS — U071 COVID-19: Secondary | ICD-10-CM | POA: Diagnosis not present

## 2021-04-17 DIAGNOSIS — E1169 Type 2 diabetes mellitus with other specified complication: Secondary | ICD-10-CM | POA: Diagnosis not present

## 2021-04-17 DIAGNOSIS — R6889 Other general symptoms and signs: Secondary | ICD-10-CM

## 2021-04-17 DIAGNOSIS — R55 Syncope and collapse: Secondary | ICD-10-CM | POA: Diagnosis not present

## 2021-04-17 DIAGNOSIS — R404 Transient alteration of awareness: Secondary | ICD-10-CM | POA: Diagnosis not present

## 2021-04-17 DIAGNOSIS — R42 Dizziness and giddiness: Secondary | ICD-10-CM | POA: Diagnosis not present

## 2021-04-17 DIAGNOSIS — R011 Cardiac murmur, unspecified: Secondary | ICD-10-CM | POA: Diagnosis not present

## 2021-04-17 DIAGNOSIS — R2689 Other abnormalities of gait and mobility: Secondary | ICD-10-CM | POA: Diagnosis present

## 2021-04-17 DIAGNOSIS — Z789 Other specified health status: Secondary | ICD-10-CM | POA: Diagnosis not present

## 2021-04-17 DIAGNOSIS — E1140 Type 2 diabetes mellitus with diabetic neuropathy, unspecified: Secondary | ICD-10-CM | POA: Diagnosis present

## 2021-04-17 DIAGNOSIS — I5032 Chronic diastolic (congestive) heart failure: Secondary | ICD-10-CM

## 2021-04-17 DIAGNOSIS — Z981 Arthrodesis status: Secondary | ICD-10-CM

## 2021-04-17 DIAGNOSIS — R1310 Dysphagia, unspecified: Secondary | ICD-10-CM | POA: Diagnosis present

## 2021-04-17 DIAGNOSIS — E785 Hyperlipidemia, unspecified: Secondary | ICD-10-CM | POA: Diagnosis present

## 2021-04-17 DIAGNOSIS — I6389 Other cerebral infarction: Secondary | ICD-10-CM | POA: Diagnosis not present

## 2021-04-17 DIAGNOSIS — R4189 Other symptoms and signs involving cognitive functions and awareness: Secondary | ICD-10-CM | POA: Diagnosis not present

## 2021-04-17 DIAGNOSIS — E1142 Type 2 diabetes mellitus with diabetic polyneuropathy: Secondary | ICD-10-CM

## 2021-04-17 DIAGNOSIS — I352 Nonrheumatic aortic (valve) stenosis with insufficiency: Secondary | ICD-10-CM | POA: Diagnosis present

## 2021-04-17 DIAGNOSIS — I160 Hypertensive urgency: Secondary | ICD-10-CM | POA: Diagnosis not present

## 2021-04-17 DIAGNOSIS — Z79899 Other long term (current) drug therapy: Secondary | ICD-10-CM

## 2021-04-17 DIAGNOSIS — R262 Difficulty in walking, not elsewhere classified: Secondary | ICD-10-CM

## 2021-04-17 DIAGNOSIS — M62561 Muscle wasting and atrophy, not elsewhere classified, right lower leg: Secondary | ICD-10-CM | POA: Diagnosis not present

## 2021-04-17 DIAGNOSIS — R296 Repeated falls: Secondary | ICD-10-CM | POA: Diagnosis present

## 2021-04-17 DIAGNOSIS — R278 Other lack of coordination: Secondary | ICD-10-CM | POA: Diagnosis not present

## 2021-04-17 DIAGNOSIS — I2510 Atherosclerotic heart disease of native coronary artery without angina pectoris: Secondary | ICD-10-CM | POA: Diagnosis not present

## 2021-04-17 DIAGNOSIS — I350 Nonrheumatic aortic (valve) stenosis: Secondary | ICD-10-CM

## 2021-04-17 DIAGNOSIS — R739 Hyperglycemia, unspecified: Secondary | ICD-10-CM | POA: Diagnosis not present

## 2021-04-17 DIAGNOSIS — R29898 Other symptoms and signs involving the musculoskeletal system: Secondary | ICD-10-CM | POA: Diagnosis not present

## 2021-04-17 DIAGNOSIS — M62562 Muscle wasting and atrophy, not elsewhere classified, left lower leg: Secondary | ICD-10-CM | POA: Diagnosis present

## 2021-04-17 DIAGNOSIS — Z8249 Family history of ischemic heart disease and other diseases of the circulatory system: Secondary | ICD-10-CM

## 2021-04-17 DIAGNOSIS — Z743 Need for continuous supervision: Secondary | ICD-10-CM | POA: Diagnosis not present

## 2021-04-17 DIAGNOSIS — I639 Cerebral infarction, unspecified: Secondary | ICD-10-CM | POA: Diagnosis present

## 2021-04-17 DIAGNOSIS — Z794 Long term (current) use of insulin: Secondary | ICD-10-CM

## 2021-04-17 DIAGNOSIS — R112 Nausea with vomiting, unspecified: Secondary | ICD-10-CM

## 2021-04-17 LAB — URINALYSIS, COMPLETE (UACMP) WITH MICROSCOPIC
Bacteria, UA: NONE SEEN
Bilirubin Urine: NEGATIVE
Glucose, UA: >=500 mg/dL — AB
Hgb urine dipstick: NEGATIVE
Ketones, ur: NEGATIVE mg/dL
Leukocytes,Ua: NEGATIVE
Nitrite: NEGATIVE
Protein, ur: NEGATIVE mg/dL
Specific Gravity, Urine: 1.01 (ref 1.005–1.030)
Squamous Epithelial / HPF: NONE SEEN (ref 0–5)
WBC, UA: NONE SEEN WBC/hpf (ref 0–5)
pH: 5 (ref 5.0–8.0)

## 2021-04-17 LAB — BASIC METABOLIC PANEL
Anion gap: 7 (ref 5–15)
BUN: 10 mg/dL (ref 8–23)
CO2: 27 mmol/L (ref 22–32)
Calcium: 8.9 mg/dL (ref 8.9–10.3)
Chloride: 102 mmol/L (ref 98–111)
Creatinine, Ser: 0.8 mg/dL (ref 0.61–1.24)
GFR, Estimated: >60 mL/min (ref >60)
Glucose, Bld: 299 mg/dL — ABNORMAL HIGH (ref 70–99)
Potassium: 3.3 mmol/L — ABNORMAL LOW (ref 3.5–5.1)
Sodium: 136 mmol/L (ref 135–145)

## 2021-04-17 LAB — CBC
HCT: 39.2 % (ref 39.0–52.0)
Hemoglobin: 14.2 g/dL (ref 13.0–17.0)
MCH: 29.8 pg (ref 26.0–34.0)
MCHC: 36.2 g/dL — ABNORMAL HIGH (ref 30.0–36.0)
MCV: 82.4 fL (ref 80.0–100.0)
Platelets: 200 10*3/uL (ref 150–400)
RBC: 4.76 MIL/uL (ref 4.22–5.81)
RDW: 13.1 % (ref 11.5–15.5)
WBC: 7.2 10*3/uL (ref 4.0–10.5)
nRBC: 0 % (ref 0.0–0.2)

## 2021-04-17 LAB — RESP PANEL BY RT-PCR (FLU A&B, COVID) ARPGX2
Influenza A by PCR: NEGATIVE
Influenza B by PCR: NEGATIVE
SARS Coronavirus 2 by RT PCR: POSITIVE — AB

## 2021-04-17 LAB — CBG MONITORING, ED: Glucose-Capillary: 223 mg/dL — ABNORMAL HIGH (ref 70–99)

## 2021-04-17 IMAGING — CT CT HEAD W/O CM
3 series · 16 of 47 positions shown, 19 images · non-contrast
Comparison: [DATE]

CLINICAL DATA: Neuro deficit, acute, stroke suspected. Dizziness.
Gait disturbance.

EXAM:
CT HEAD WITHOUT CONTRAST
TECHNIQUE: Contiguous axial images were obtained from the base of the skull
through the vertex without intravenous contrast.

[Series 3: head wo · axial · 0.41mm/px · z∈[-143,-8]mm · 10 of 33 slices shown, 13 images]
[im 3/33  brain]
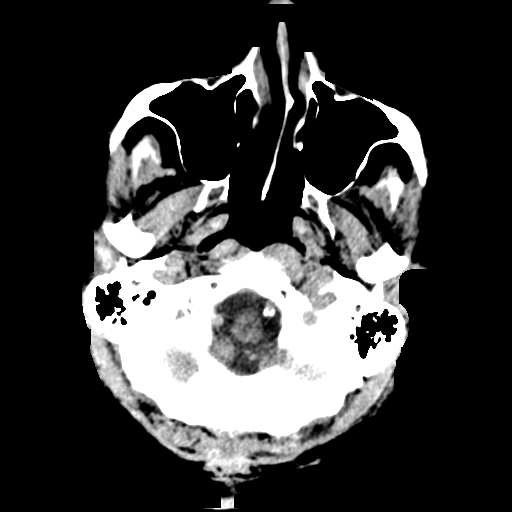
[im 3/33  bone]
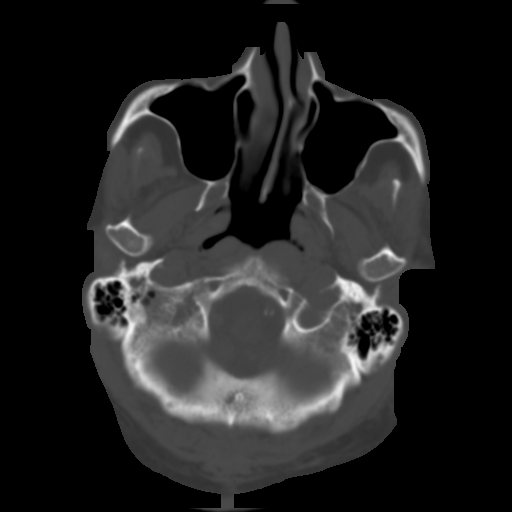
[im 6/33  brain]
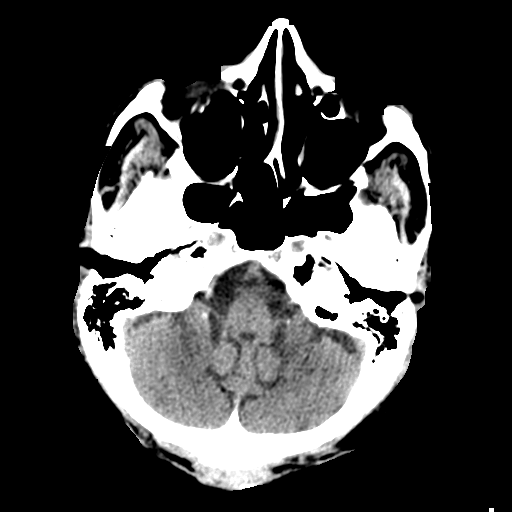
[im 9/33  brain]
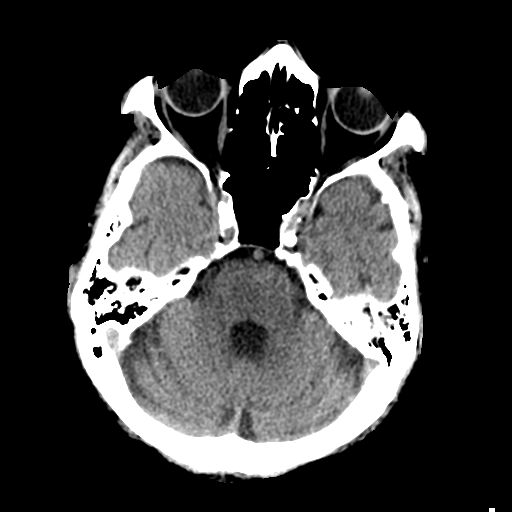
[im 12/33  brain]
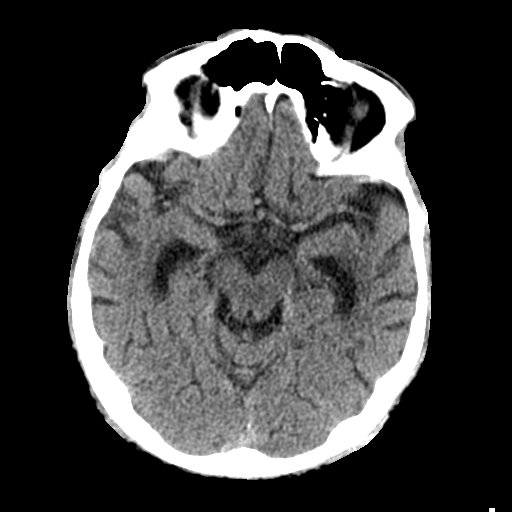
[im 15/33  brain]
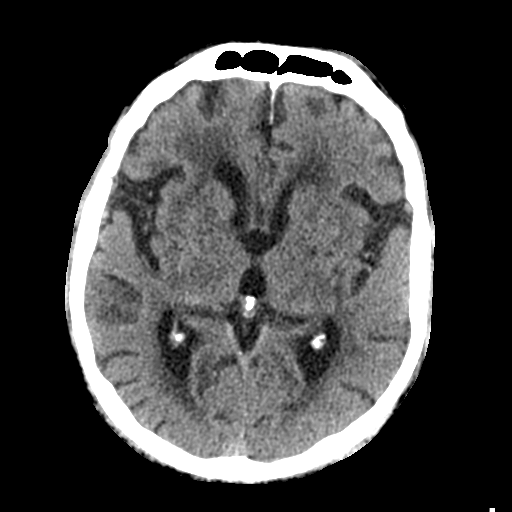
[im 15/33  bone]
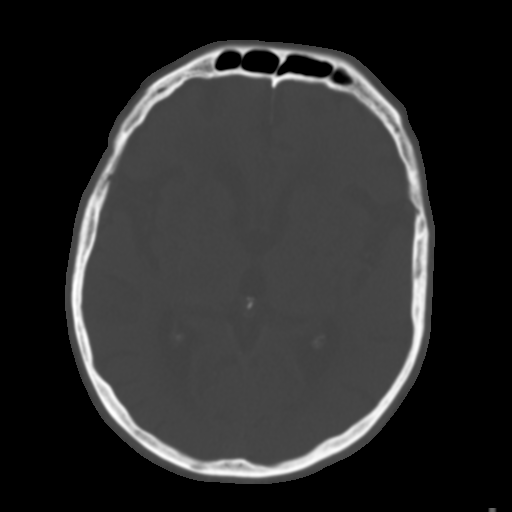
[im 18/33  brain]
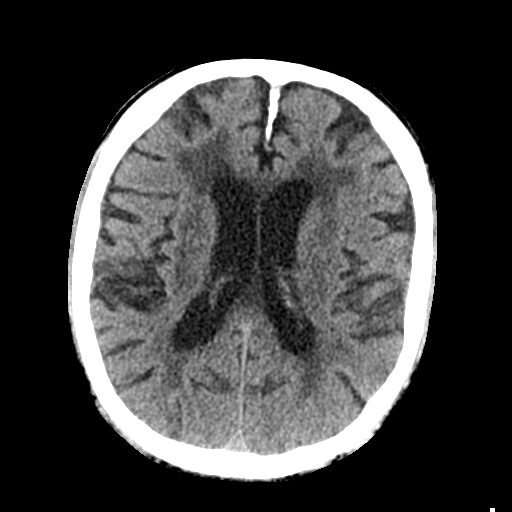
[im 21/33  brain]
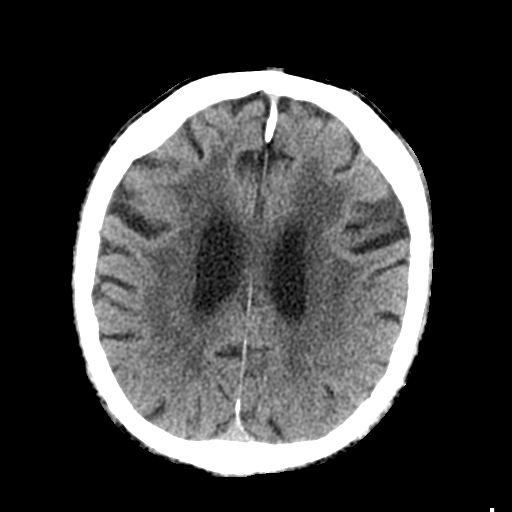
[im 25/33  brain]
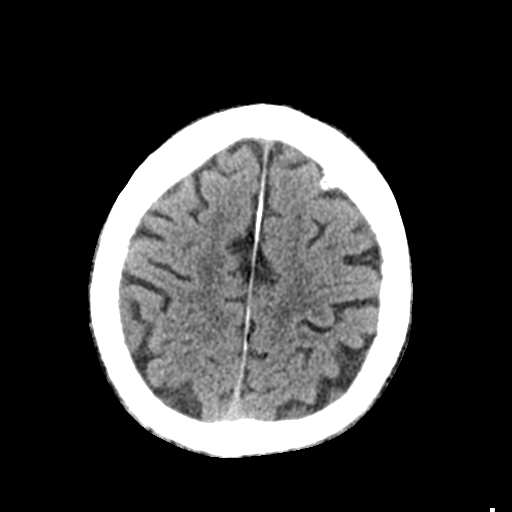
[im 27/33  brain]
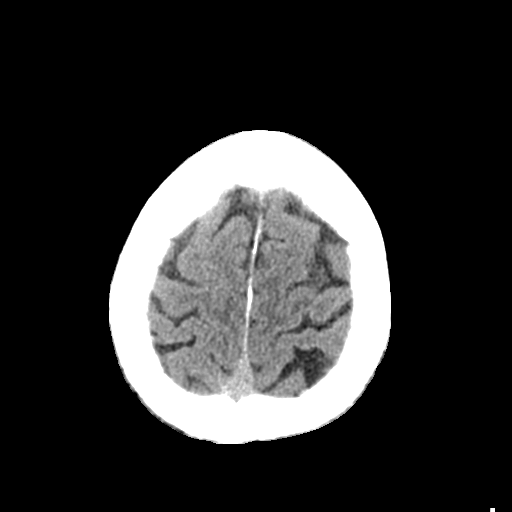
[im 27/33  bone]
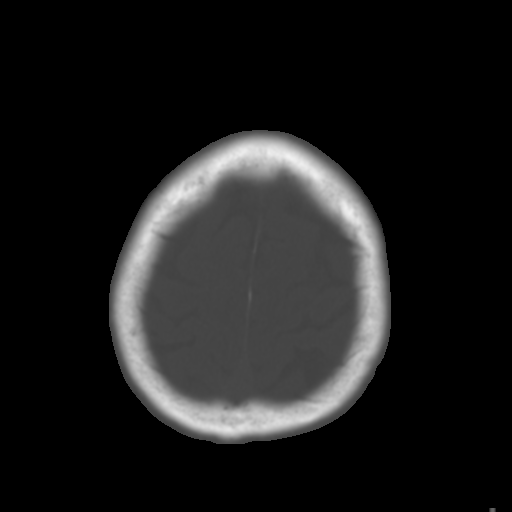
[im 30/33  brain]
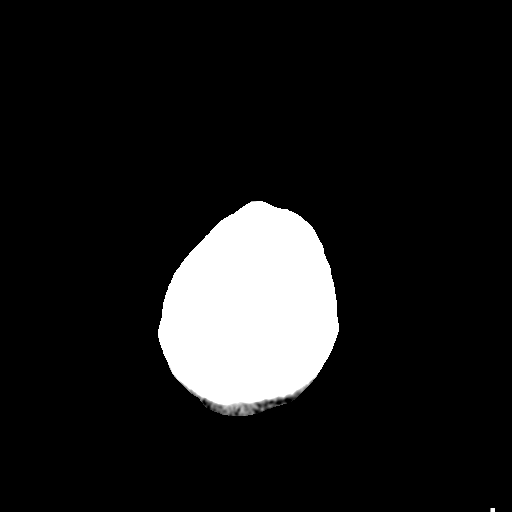

[Series 4: coronal soft tissue · coronal · 0.33mm/px · 3 of 68 slices shown]
[im 23/68  brain]
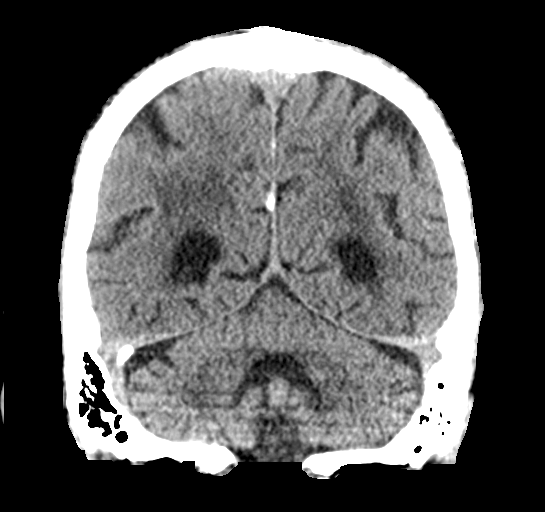
[im 30/68  brain]
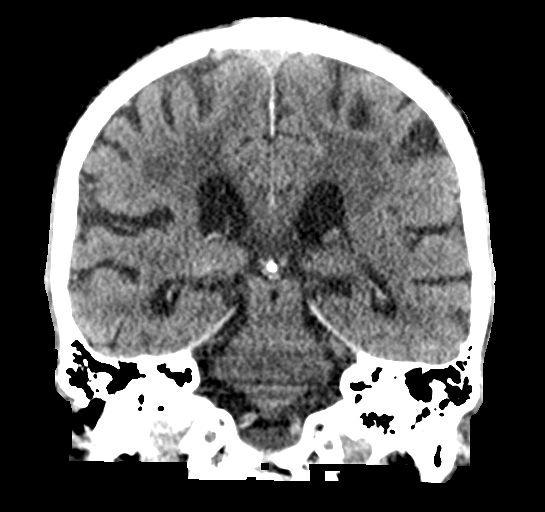
[im 38/68  brain]
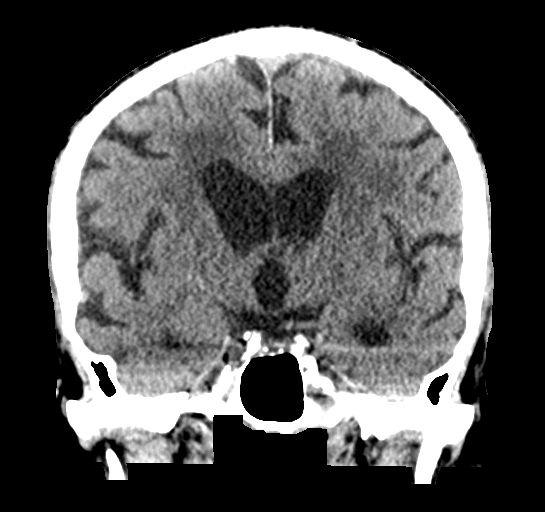

[Series 5: sagittal soft tissue · sagittal · 0.33mm/px · 3 of 59 slices shown]
[im 20/59  brain]
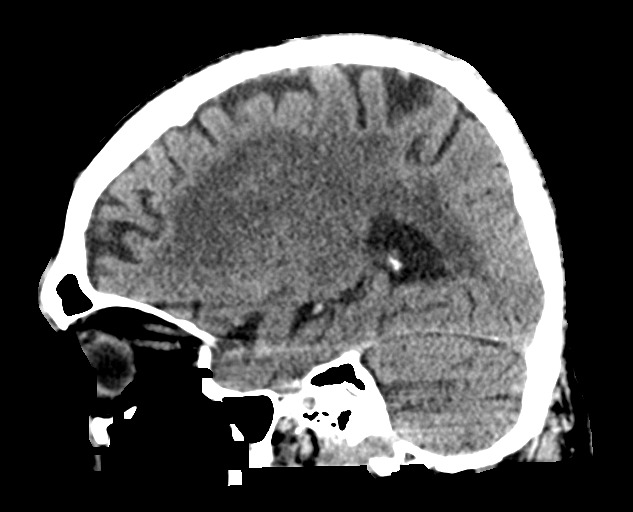
[im 30/59  brain]
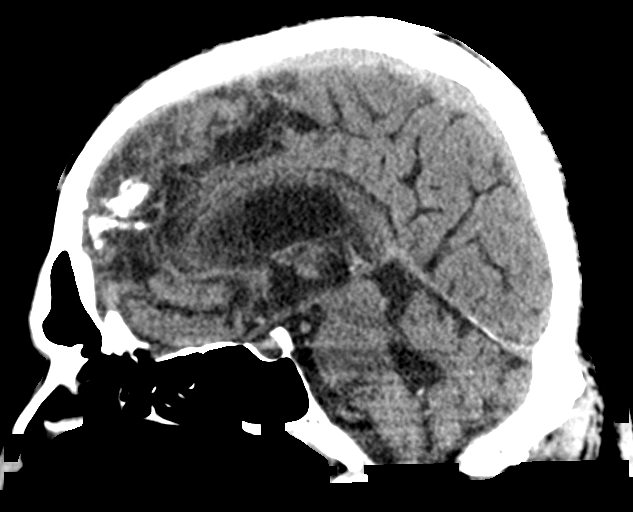
[im 39/59  brain]
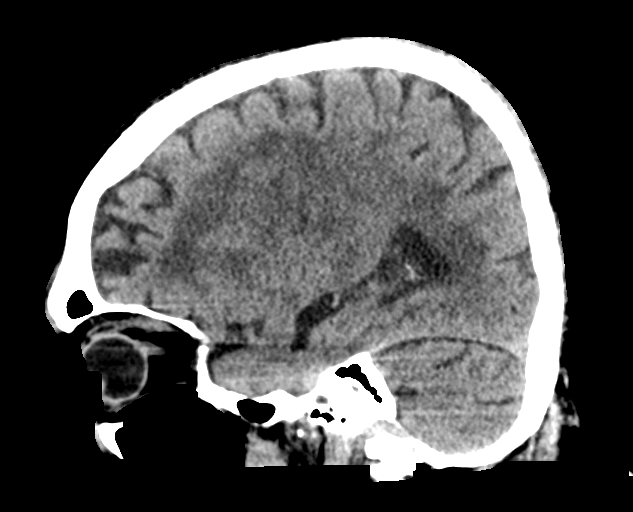

[16 of 47 positions shown; findings below may reference images not displayed]

FINDINGS: Brain: Advanced generalized brain volume loss. Advanced chronic
small-vessel ischemic changes affecting the pons, thalami and
cerebral hemispheric white matter. No discernible change since last
year. No large vessel territory stroke. No mass, hemorrhage,
hydrocephalus or extra-axial collection.

Vascular: There is atherosclerotic calcification of the major
vessels at the base of the brain.

Skull: Negative

Sinuses/Orbits: Clear/normal

Other: None
IMPRESSION: No acute finding or change since last year. Generalized brain volume
loss with extensive chronic small-vessel ischemic changes throughout
as described.

## 2021-04-17 IMAGING — MR MR HEAD W/O CM
9 series · 45 of 48 positions shown · non-contrast
Comparison: [DATE]

CLINICAL DATA: Dizziness

EXAM:
MRI HEAD WITHOUT CONTRAST
TECHNIQUE: Multiplanar, multiecho pulse sequences of the brain and surrounding
structures were obtained without intravenous contrast.

[Series 5: ax dwi_tracew · axial · 3.0mm · 0.65mm/px · z∈[-75,+77]mm · 5 of 48 slices shown]
[im 1/48]
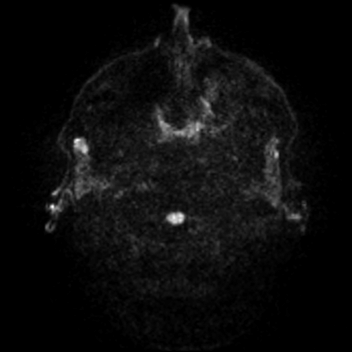
[im 12/48]
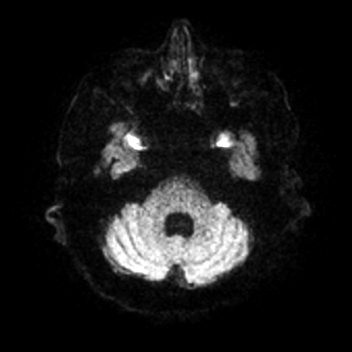
[im 24/48]
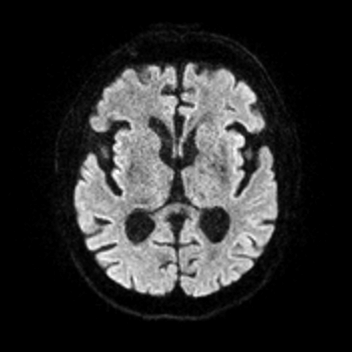
[im 36/48]
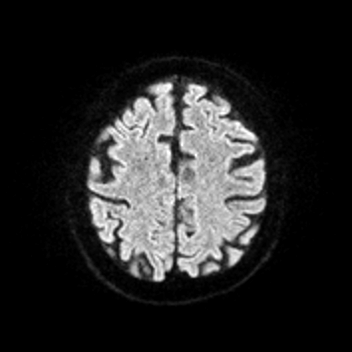
[im 48/48]
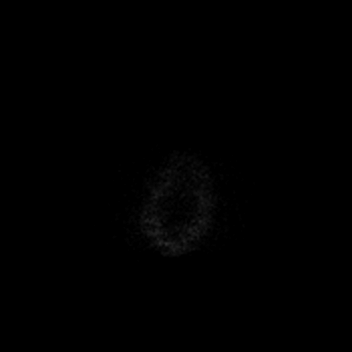

[Series 6: ax dwi_adc · axial · 3.0mm · 0.65mm/px · z∈[-75,+77]mm · 5 of 48 slices shown]
[im 1/48]
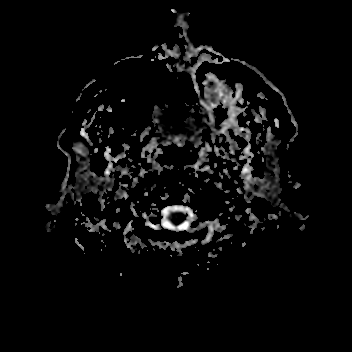
[im 12/48]
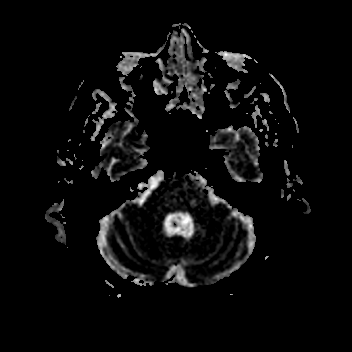
[im 24/48]
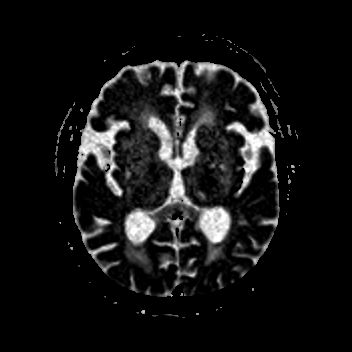
[im 36/48]
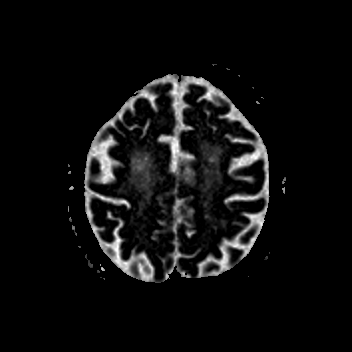
[im 48/48]
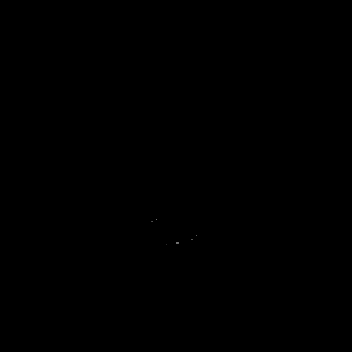

[Series 9: T1 · sagittal · 5.0mm · 0.62mm/px · 2 of 25 slices shown (1 of 2)]
[im 1/25]
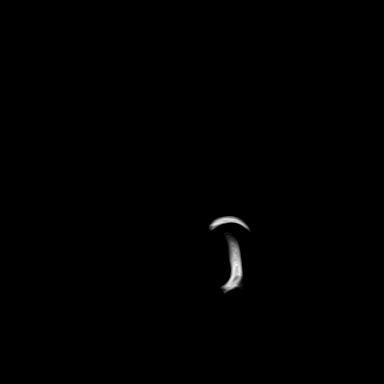
[im 25/25]
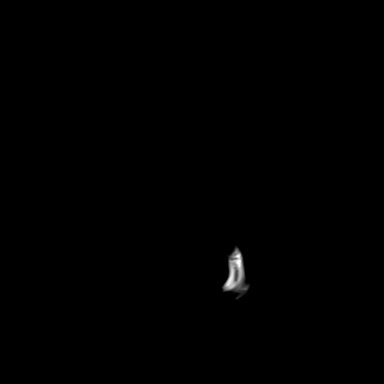

[Series 10: T2 · axial · 5.0mm · 0.53mm/px · z∈[-77,+76]mm · 2 of 27 slices shown (1 of 2)]
[im 1/27]
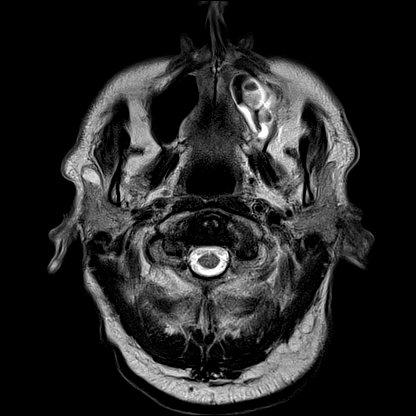
[im 27/27]
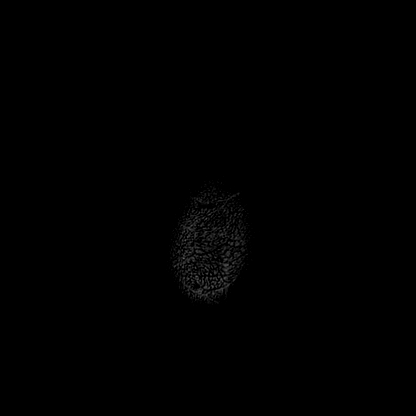

[Series 12: pha_images · axial · 3.0mm · 0.90mm/px · z∈[-87,+83]mm · 5 of 56 slices shown]
[im 1/56]
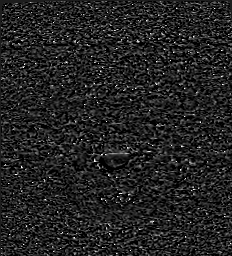
[im 14/56]
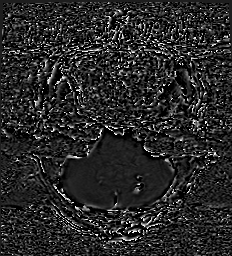
[im 28/56]
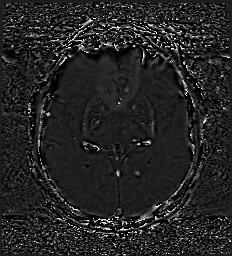
[im 42/56]
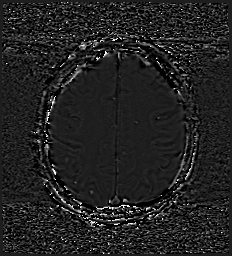
[im 56/56]
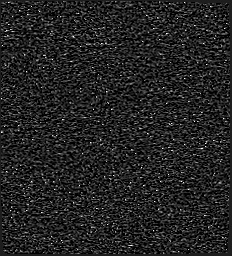

[Series 13: swi_images · axial · 3.0mm · 0.90mm/px · z∈[-87,+86]mm · 5 of 60 slices shown]
[im 1/60]
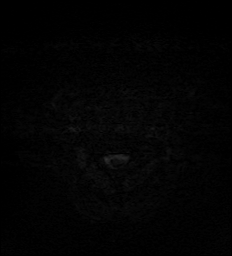
[im 15/60]
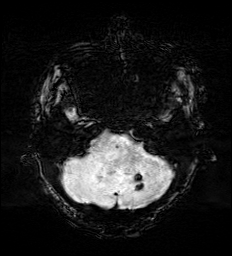
[im 30/60]
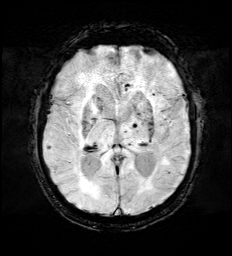
[im 45/60]
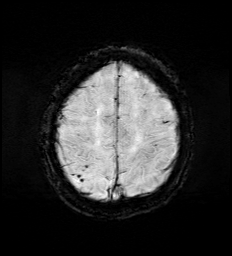
[im 60/60]
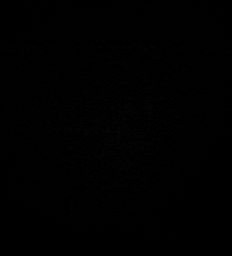

[Series 15: FLAIR · axial · 3.0mm · 0.53mm/px · z∈[-80,+79]mm · 5 of 55 slices shown]
[im 1/55]
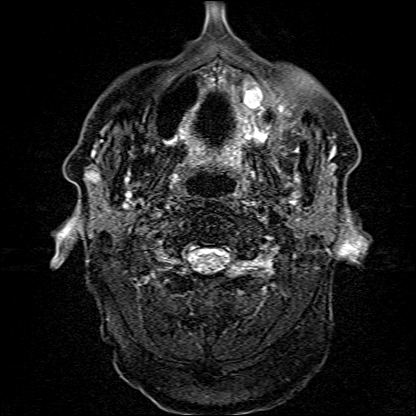
[im 14/55]
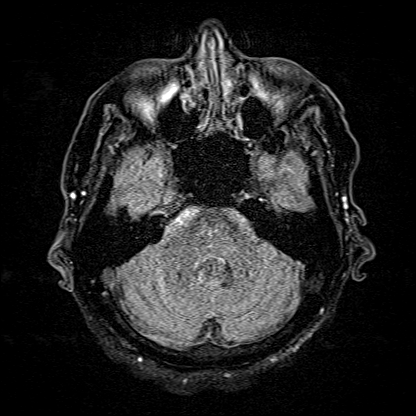
[im 28/55]
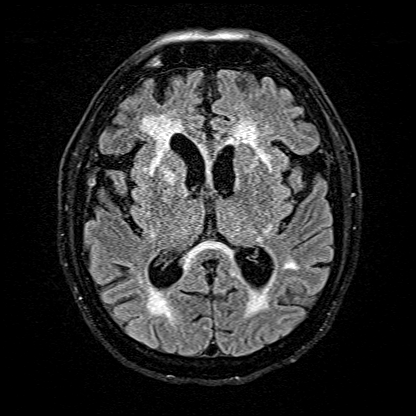
[im 41/55]
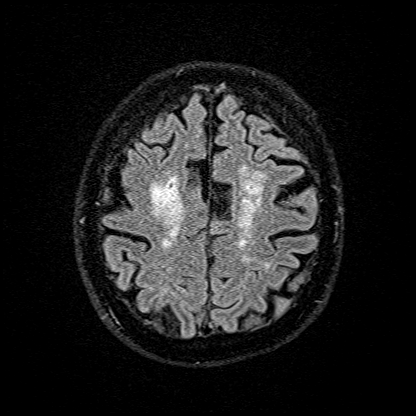
[im 55/55]
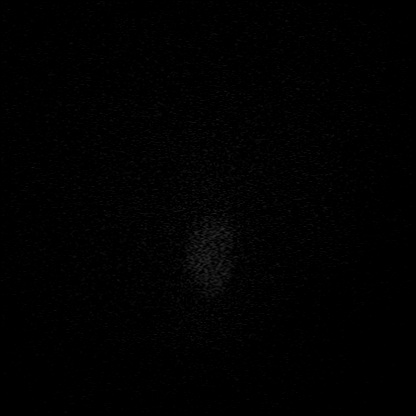

[Series 16: T1 · axial · 1.0mm · 0.98mm/px · z∈[-83,+88]mm · 13 of 176 slices shown (2 of 2)]
[im 1/176]
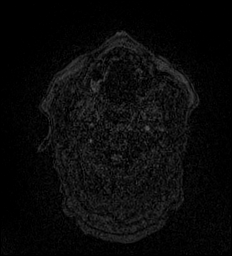
[im 12/176]
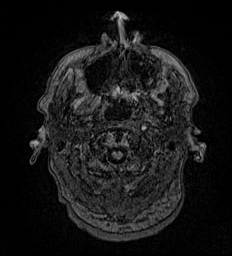
[im 24/176]
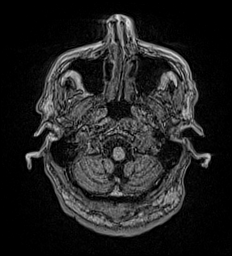
[im 36/176]
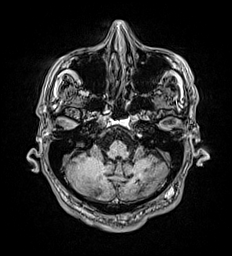
[im 47/176]
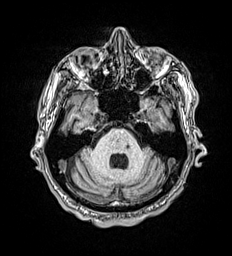
[im 59/176]
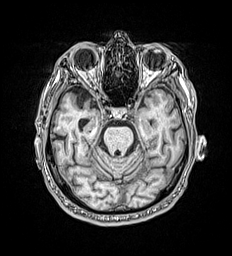
[im 71/176]
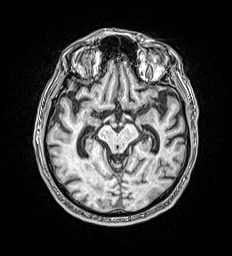
[im 82/176]
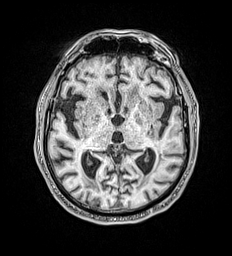
[im 94/176]
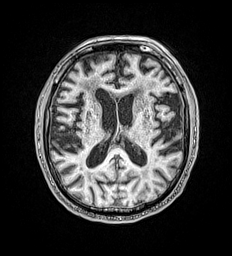
[im 106/176]
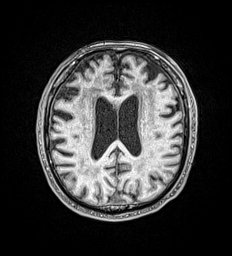
[im 129/176]
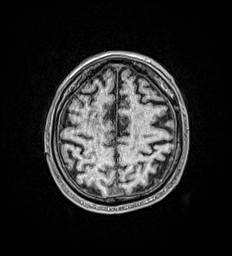
[im 152/176]
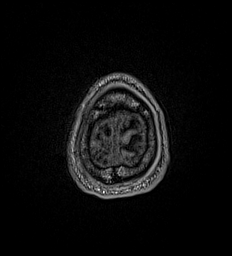
[im 176/176]
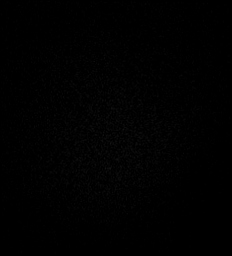

[Series 17: T2 · coronal · 5.0mm · 0.45mm/px · 3 of 31 slices shown (2 of 2)]
[im 1/31]
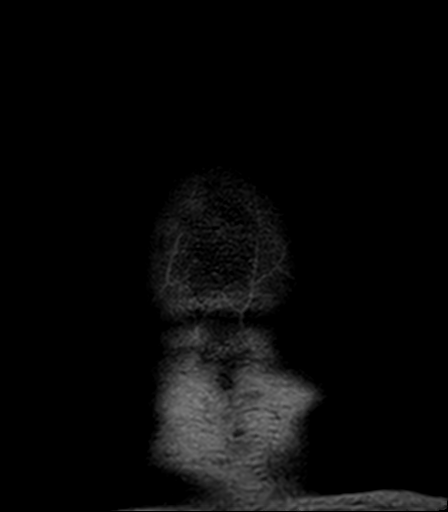
[im 16/31]
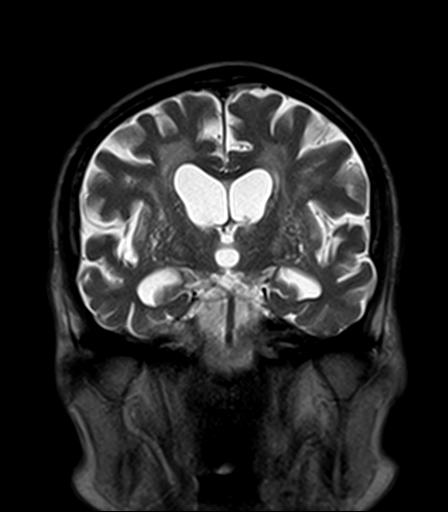
[im 31/31]
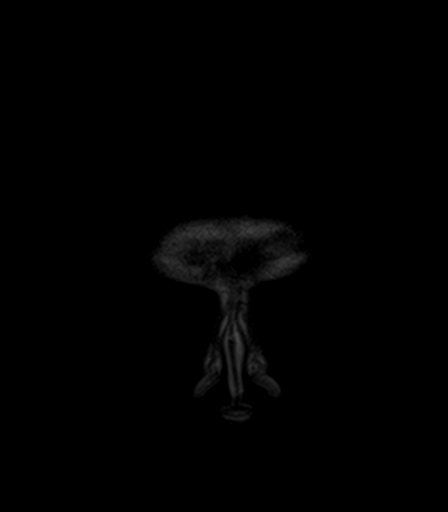

[45 of 48 positions shown; findings below may reference images not displayed]

FINDINGS: Brain: Small focus of abnormal diffusion restriction within the left
medullary pyramid. There is also a small focus of ischemia in the
superior right frontal lobe. Numerous chronic microhemorrhages,
predominantly central. Hyperintense T2-weighted signal is moderately
widespread throughout the white matter. Advanced atrophy for age.
The midline structures are normal.

Vascular: Major flow voids are preserved.

Skull and upper cervical spine: Normal calvarium and skull base.
Visualized upper cervical spine and soft tissues are normal.

Sinuses/Orbits:No paranasal sinus fluid levels or advanced mucosal
thickening. No mastoid or middle ear effusion. Normal orbits.
IMPRESSION: 1. Small acute infarcts of the left medullary pyramid and superior
right frontal lobe. No acute hemorrhage or mass effect.
2. Numerous chronic microhemorrhages, predominantly central. This
likely indicates chronic hypertensive angiopathy.
3. Advanced atrophy and chronic small vessel ischemic changes.

## 2021-04-17 IMAGING — DX DG CHEST 1V PORT
1 series · 1 of 1 positions shown · non-contrast
Comparison: [DATE]

CLINICAL DATA: [6A] positivity with dizziness, initial
encounter

EXAM:
PORTABLE CHEST 1 VIEW

[chest ap]
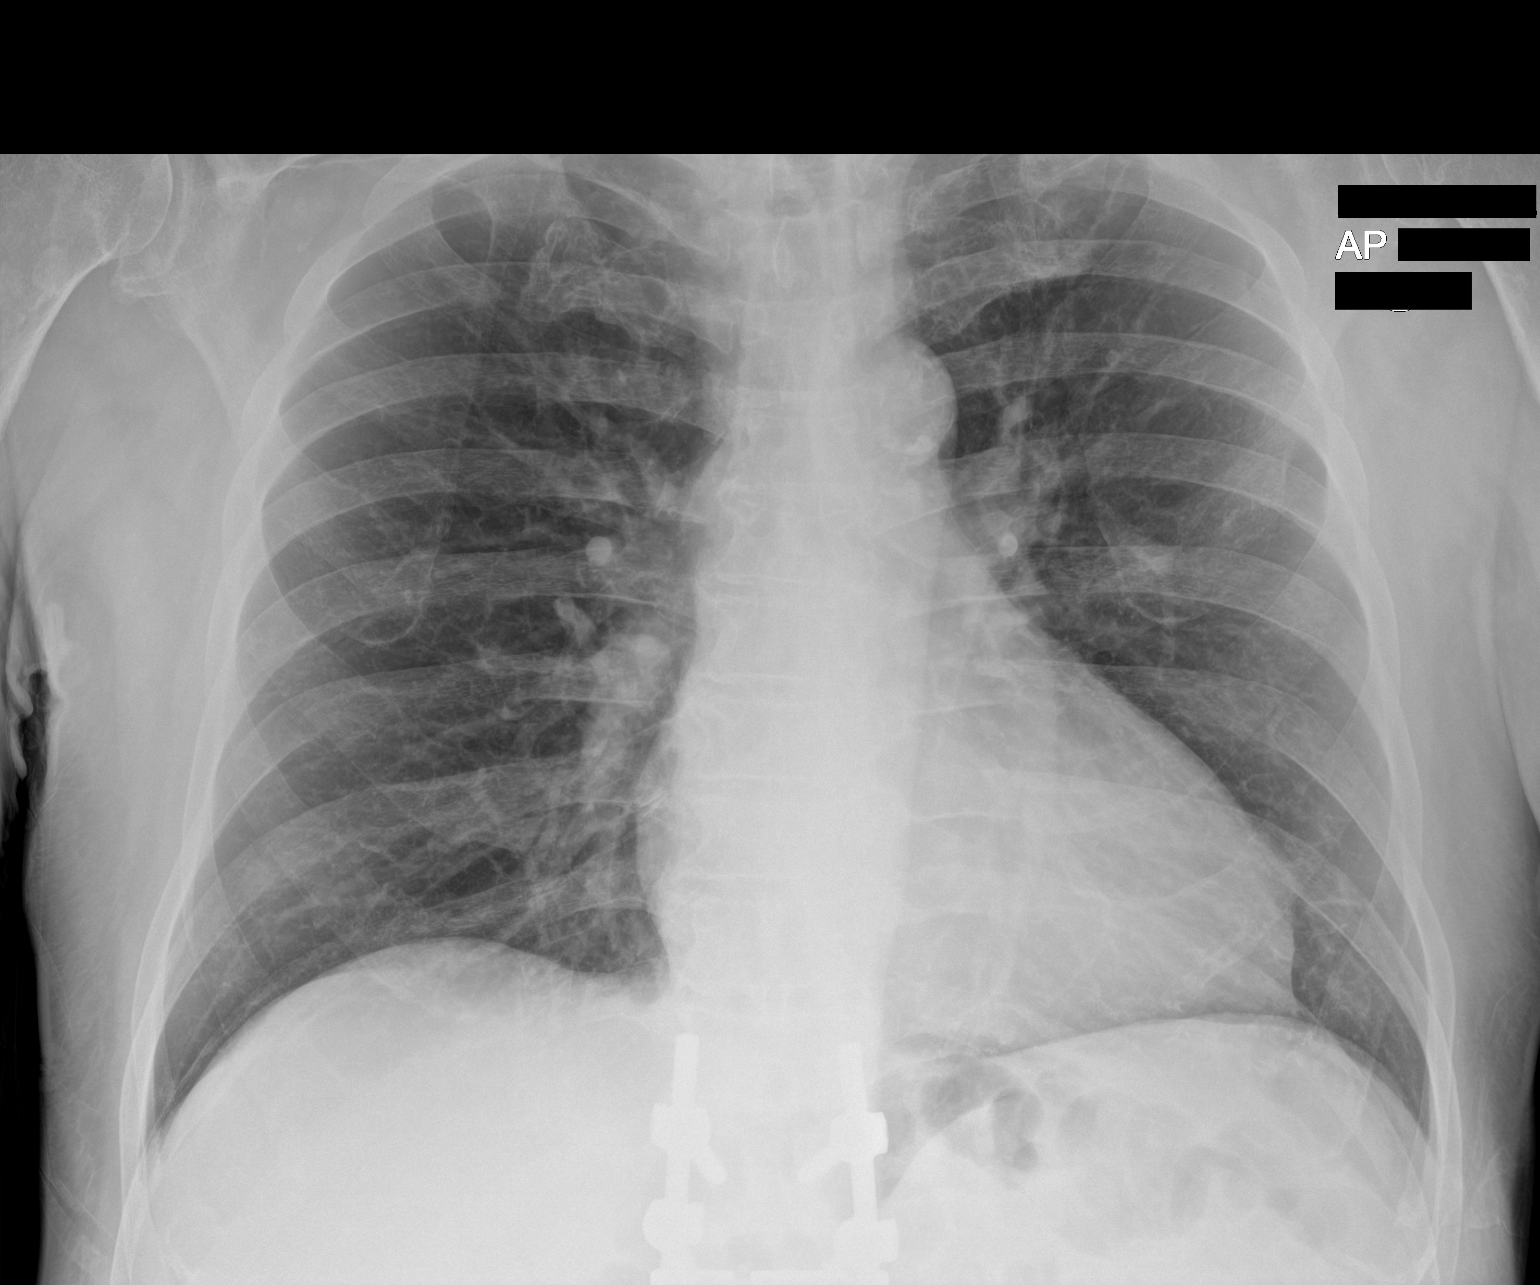

[1 of 1 positions shown; findings below may reference images not displayed]

FINDINGS: Cardiac shadow is within normal limits. Aortic calcifications are
seen. Lungs are clear bilaterally. Postsurgical changes in the
thoracolumbar spine are noted.
IMPRESSION: No active disease.

## 2021-04-17 MED ORDER — INSULIN ASPART PROT & ASPART (70-30 MIX) 100 UNIT/ML ~~LOC~~ SUSP
15.0000 [IU] | Freq: Two times a day (BID) | SUBCUTANEOUS | Status: DC
  Administered 2021-04-18 – 2021-04-19 (×3): 15 [IU] via SUBCUTANEOUS
  Filled 2021-04-17 (×2): qty 10

## 2021-04-17 MED ORDER — ENALAPRIL MALEATE 10 MG PO TABS
20.0000 mg | ORAL_TABLET | Freq: Every day | ORAL | Status: DC
  Administered 2021-04-17 – 2021-04-18 (×2): 20 mg via ORAL
  Filled 2021-04-17 (×2): qty 2

## 2021-04-17 MED ORDER — MECLIZINE HCL 25 MG PO TABS
25.0000 mg | ORAL_TABLET | Freq: Three times a day (TID) | ORAL | Status: DC | PRN
  Administered 2021-04-30 – 2021-05-08 (×2): 25 mg via ORAL
  Filled 2021-04-17 (×4): qty 1

## 2021-04-17 MED ORDER — ATORVASTATIN CALCIUM 20 MG PO TABS
40.0000 mg | ORAL_TABLET | Freq: Every day | ORAL | Status: DC
  Administered 2021-04-17 – 2021-04-18 (×2): 40 mg via ORAL
  Filled 2021-04-17 (×2): qty 2

## 2021-04-17 MED ORDER — INSULIN ASPART 100 UNIT/ML IJ SOLN
0.0000 [IU] | Freq: Three times a day (TID) | INTRAMUSCULAR | Status: DC
  Administered 2021-04-18: 5 [IU] via SUBCUTANEOUS
  Administered 2021-04-19: 12:00:00 3 [IU] via SUBCUTANEOUS
  Administered 2021-04-19: 18:00:00 2 [IU] via SUBCUTANEOUS
  Administered 2021-04-19: 09:00:00 3 [IU] via SUBCUTANEOUS
  Administered 2021-04-20: 09:00:00 8 [IU] via SUBCUTANEOUS
  Administered 2021-04-20: 5 [IU] via SUBCUTANEOUS
  Administered 2021-04-21 – 2021-04-22 (×4): 3 [IU] via SUBCUTANEOUS
  Administered 2021-04-22 – 2021-04-23 (×3): 5 [IU] via SUBCUTANEOUS
  Administered 2021-04-23: 10:00:00 2 [IU] via SUBCUTANEOUS
  Administered 2021-04-23: 13:00:00 3 [IU] via SUBCUTANEOUS
  Administered 2021-04-24: 5 [IU] via SUBCUTANEOUS
  Administered 2021-04-24: 3 [IU] via SUBCUTANEOUS
  Administered 2021-04-24: 17:00:00 5 [IU] via SUBCUTANEOUS
  Filled 2021-04-17 (×19): qty 1

## 2021-04-17 MED ORDER — LABETALOL HCL 5 MG/ML IV SOLN
10.0000 mg | Freq: Once | INTRAVENOUS | Status: AC
  Administered 2021-04-17: 10 mg via INTRAVENOUS
  Filled 2021-04-17: qty 4

## 2021-04-17 MED ORDER — HYDRALAZINE HCL 20 MG/ML IJ SOLN
10.0000 mg | INTRAMUSCULAR | Status: DC | PRN
  Administered 2021-04-18: 10 mg via INTRAVENOUS
  Filled 2021-04-17 (×2): qty 1

## 2021-04-17 MED ORDER — POTASSIUM CHLORIDE CRYS ER 20 MEQ PO TBCR
40.0000 meq | EXTENDED_RELEASE_TABLET | Freq: Once | ORAL | Status: AC
  Administered 2021-04-17: 40 meq via ORAL
  Filled 2021-04-17: qty 2

## 2021-04-17 MED ORDER — ENOXAPARIN SODIUM 40 MG/0.4ML IJ SOSY
40.0000 mg | PREFILLED_SYRINGE | INTRAMUSCULAR | Status: DC
  Administered 2021-04-17 – 2021-05-08 (×22): 40 mg via SUBCUTANEOUS
  Filled 2021-04-17 (×22): qty 0.4

## 2021-04-17 MED ORDER — NIRMATRELVIR/RITONAVIR (PAXLOVID)TABLET
3.0000 | ORAL_TABLET | Freq: Two times a day (BID) | ORAL | Status: DC
  Filled 2021-04-17: qty 3

## 2021-04-17 MED ORDER — INSULIN ASPART 100 UNIT/ML IJ SOLN
0.0000 [IU] | Freq: Every day | INTRAMUSCULAR | Status: DC
  Administered 2021-04-17 – 2021-04-18 (×2): 2 [IU] via SUBCUTANEOUS
  Filled 2021-04-17: qty 1

## 2021-04-17 MED ORDER — ASPIRIN EC 81 MG PO TBEC
81.0000 mg | DELAYED_RELEASE_TABLET | Freq: Every day | ORAL | Status: DC
  Administered 2021-04-17 – 2021-05-09 (×23): 81 mg via ORAL
  Filled 2021-04-17 (×23): qty 1

## 2021-04-17 MED ORDER — AMLODIPINE BESYLATE 5 MG PO TABS
10.0000 mg | ORAL_TABLET | Freq: Every day | ORAL | Status: DC
  Administered 2021-04-17 – 2021-04-18 (×2): 10 mg via ORAL
  Filled 2021-04-17 (×2): qty 2

## 2021-04-17 MED ORDER — ATORVASTATIN CALCIUM 20 MG PO TABS: 40.0000 mg | ORAL_TABLET | Freq: Every day | ORAL | Status: DC

## 2021-04-17 NOTE — ED Triage Notes (Signed)
Pt comes into the ED via [facility] from home c/o dizziness when standing.  Pt apparently has not been taking medication as prescribed and has had multiple falls regarding the same problems.

## 2021-04-17 NOTE — ED Notes (Signed)
MRI screened patient

## 2021-04-17 NOTE — H&P (Addendum)
History and Physical    GRAYLING SCHRANZ ZHG:[id number] DOB: 1946-02-21 DOA: 04/17/2021  PCP: Patient, No Pcp Per (Inactive)  Chief Complaint: Dizziness  HPI: Victor Fox is a 75 y.o. male with a past medical history of significant diabetes mellitus type 2, hypertension, hyperlipidemia, neuropathy, vertigo, carotid artery disease, coronary artery disease, history of CVA, former smoker.  This patient presents to the emergency department due to dizziness upon standing.  States since Saturday he cannot walk.  He started falling all the time.  In the emergency department today attempted to walk him but he is leaning towards the left side. Pharmacy notes that he has been having difficulty administering his insulin as he cannot see him units to give himself due to his eyesight.  ED Course: MRI brain without contrast, CT head without contrast, EKG, CBC, UA, BMP.  Labetalol 10 mg IV x1.  Review of Systems: 14 point review of systems is negative except for what is mentioned above in the HPI.   Past Medical History:  Diagnosis Date   Arthritis    Benign neoplasm of unspecified site    Carotid artery disease ([place])    a. carotid duplex 05/2015: less tahn 39% stenosis bilaterally, stable over the past year, recommend repeat imaging in 2 years (05/2017)   Compression fracture    a. L2   CVA (cerebral vascular accident) ([place]) 06/2012   Diabetes mellitus without complication (HCC)    Heart murmur    a. echo 06/2012: EF >55%, LVH, aortic valve sclerosis   Hyperlipidemia    Hypertension    Neuropathy    Pneumonia    Tobacco abuse    a. smoked x 25 years   Vitamin D deficiency     Past Surgical History:  Procedure Laterality Date   ARTHRODESIS     BACK SURGERY     x 2   discectomy     trigger release      Social History   Socioeconomic History   Marital status: Single    Spouse name: Not on file   Number of children: 2   Years of education: Not on file   Highest education level: 12th  grade  Occupational History    Comment: full time  Tobacco Use   Smoking status: Former    Packs/day: 1.00    Years: 10.00    Pack years: 10.00    Types: Cigarettes   Smokeless tobacco: Never  Vaping Use   Vaping Use: Never used  Substance and Sexual Activity   Alcohol use: No   Drug use: No   Sexual activity: Not Currently  Other Topics Concern   Not on file  Social History Narrative   Not on file   Social Determinants of Health   Financial Resource Strain: Not on file  Food Insecurity: Not on file  Transportation Needs: Not on file  Physical Activity: Not on file  Stress: Not on file  Social Connections: Not on file  Intimate Partner Violence: Not on file    No Known Allergies  Family History  Problem Relation Age of Onset   Heart disease Brother    Hypertension Mother     Prior to Admission medications   Medication Sig Start Date End Date Taking? Authorizing Provider  Insulin Isophane & Regular Human (NOVOLIN 70/30 FLEXPEN) (70-30) 100 UNIT/ML PEN Inject 15 Units into the skin in the morning and at bedtime. 10/20/19  Yes Samuella Cota, MD  amLODipine (NORVASC) 10 MG tablet Take  1 tablet (10 mg total) by mouth daily. Appointment needed please for further refills Patient not taking: No sig reported 10/20/19   Samuella Cota, MD  aspirin EC 81 MG tablet Take 81 mg by mouth daily. Patient not taking: Reported on 04/17/2021    [provider]  atorvastatin (LIPITOR) 40 MG tablet Take 1 tablet (40 mg total) by mouth at bedtime. Patient not taking: No sig reported 10/20/19   Samuella Cota, MD  B-D UF III MINI PEN NEEDLES 31G X 5 MM MISC use once daily 01/28/17   Roselee Nova, MD  blood glucose meter kit and supplies KIT Dispense based on patient and insurance preference. Use up to four times daily as directed. (FOR ICD-9 250.00, 250.01). 11/23/18   Poulose, Bethel Born, NP  enalapril (VASOTEC) 20 MG tablet Take 1 tablet (20 mg total) by mouth  daily. Patient not taking: No sig reported 10/20/19   Samuella Cota, MD  Lancets [facility] DELICA PLUS [id number]) MISC USE QID UTD 11/23/18   [provider]  meclizine (ANTIVERT) 25 MG tablet Take 1 tablet (25 mg total) by mouth 3 (three) times daily as needed for dizziness. Patient not taking: Reported on 04/17/2021 01/08/20   Arta Silence, MD  ONE [facility] ULTRA TEST test strip USE QID UTD 11/23/18   [provider]    Physical Exam: Vitals:   04/17/21 1730 04/17/21 1800 04/17/21 1835 04/17/21 1915  BP: (!) 148/62 (!) 162/71 (!) 205/82 (!) 188/156  Pulse: (!) 58 (!) 58 (!) 54 60  Resp: _0 Temp:      TempSrc:      SpO2: 98% 100% 99% 100%  Weight:      Height:         General:  Appears calm and comfortable and is in NAD Cardiovascular:  RRR, 9-8/9 systolic ejection murmur LUSB Respiratory:   CTA bilaterally with no wheezes/rales/rhonchi.  Normal respiratory effort. Abdomen:  soft, NT, ND, NABS Skin:  no rash or induration seen on limited exam Musculoskeletal:  grossly normal tone BUE/BLE, good ROM, no bony abnormality Lower extremity:  No LE edema.  Limited foot exam with no ulcerations.  2+ distal pulses. Psychiatric:  grossly normal mood and affect, speech fluent and appropriate, AOx3 Neurologic:    Normal speech and language. No gross focal neurologic deficits are appreciated.  Cranial nerves II through XII are intact although visual fields were not checked cerebellar finger-nose is normal motor strength is 5/5 throughout patient does not report any numbness but when we try to stand patient have him walk he is leaning over to the left and cannot stand or walk without support.  He has difficulty moving his legs as well.    Radiological Exams on Admission: Independently reviewed - see discussion in A/P where applicable  CT HEAD WO CONTRAST (5MM)  Result Date: 04/17/2021 CLINICAL DATA:  Neuro deficit, acute, stroke suspected. Dizziness. Gait  disturbance. EXAM: CT HEAD WITHOUT CONTRAST TECHNIQUE: Contiguous axial images were obtained from the base of the skull through the vertex without intravenous contrast. COMPARISON:  01/08/2020 FINDINGS: Brain: Advanced generalized brain volume loss. Advanced chronic small-vessel ischemic changes affecting the pons, thalami and cerebral hemispheric white matter. No discernible change since last year. No large vessel territory stroke. No mass, hemorrhage, hydrocephalus or extra-axial collection. Vascular: There is atherosclerotic calcification of the major vessels at the base of the brain. Skull: Negative Sinuses/Orbits: Clear/normal Other: None IMPRESSION: No acute finding or change  since last year. Generalized brain volume loss with extensive chronic small-vessel ischemic changes throughout as described. Electronically Signed   By: Mark  Shogry M.D.   On: 04/17/2021 20:10    EKG: Independently reviewed.  Initial EKG showed a flutter versus artifact rate 132, repeat EKG sinus rhythm rate 63.   Labs on Admission: I have personally reviewed the available labs and imaging studies at the time of the admission.  Pertinent labs: Potassium 3.3, blood glucose 299; UA: glucose greater than 500.   Assessment/Plan: Gait disturbance: This patient will be admitted to the medical/surgical floor under observation status.  Continue cardiac monitoring.  MRI of the brain without contrast has been ordered to rule out CVA. CT head showed no acute findings. PT/OT have been ordered.  TOC will be consulted for possible skilled nursing facility rehab upon discharge.  Obtain echocardiogram.  Obtain orthostatics.  Obtain carotid duplex ultrasound.  Abnormal EKG: Initial EKG in the ED showed possible A.flutter vs artifact. Day team please discuss EKG with Cardiology in the morning.  COVID-19 infection: Start Paxlovid?  Will discuss with pharmacy.  He is asymptomatic.  Hypertensive urgency: Continue home amlodipine 10 mg daily.   Continue home Vasotec 20 mg daily.  Patient has been given labetalol 10 mg IV x1 in the emergency department.  Start hydralazine 10 mg every 4 hours as needed for high blood pressure.  Titrate medications as needed.  Uncontrolled insulin-dependent diabetes mellitus type 2: Consult diabetes coordinator.  Continue home Novolin 70/30 15 units twice daily.  Start moderate dose sliding scale insulin with Accu-Cheks before meals and at bedtime.  Obtain A1c.  Vertigo: Continue home meclizine 25 mg 3 times daily as needed.  Hyperlipidemia: Continue home atorvastatin 80 mg daily.  Coronary artery disease: Continue home aspirin 81 mg daily.  Carotid artery disease: We will obtain a carotid duplex ultrasound as noted above.  Level of Care: MedSurg  DVT prophylaxis: Lovenox subcu Code Status: Full code Consults: None Admission status: Observation    S  DO [facility]   How to contact the [facility] Attending or Consulting provider 7A - 7P or covering provider during after hours 7P -7A, for this patient?  Check the care team in [facility] and look for a) attending/consulting [facility] provider listed and b) the [facility] team listed Log into [email or web address] and use [place]'s universal password to access. If you do not have the password, please contact the hospital operator. Locate the [facility] provider you are looking for under [facility] and page to a number that you can be directly reached. If you still have difficulty reaching the provider, please page the DOC (Director on Call) for the Hospitalists listed on amion for assistance.   04/17/2021, 8:48 PM    

## 2021-04-17 NOTE — ED Triage Notes (Addendum)
Pt comes into the ED via EMS from home, called out by ADS, pt c/o dizziness upon standing, states ADS has not been taking meds as prescribed, has had to called EMS a couple of times to assist after falling.   194/82 97%RA 63HR [id number]

## 2021-04-17 NOTE — ED Provider Notes (Signed)
[facility] Emergency Department Provider Note   ____________________________________________   Event Date/Time   First MD Initiated Contact with Patient 04/17/21 1822     (approximate)  I have reviewed the triage vital signs and the nursing notes.   HISTORY  Chief Complaint Dizziness    HPI Victor Fox is a 75 y.o. male who reports since Saturday he has been unable to walk.  He falls frequently and is fallen repeatedly at home.  Several times a day in the last couple days.  His not passed out.  He remembers falling.  Does not have any neck or back pain.  He has scratched his arms up trying to grab things.  Does not have any chest pain or belly pain or shortness of breath or nausea or vomiting.  Patient did have a cardiac echo in 2017 that showed aortic valve sclerosis.         Past Medical History:  Diagnosis Date   Arthritis    Benign neoplasm of unspecified site    Carotid artery disease ([place])    a. carotid duplex 05/2015: less tahn 39% stenosis bilaterally, stable over the past year, recommend repeat imaging in 2 years (05/2017)   Compression fracture    a. L2   CVA (cerebral vascular accident) ([place]) 06/2012   Diabetes mellitus without complication ([place])    Heart murmur    a. echo 06/2012: EF >55%, LVH, aortic valve sclerosis   Hyperlipidemia    Hypertension    Neuropathy    Pneumonia    Tobacco abuse    a. smoked x 25 years   Vitamin D deficiency     Patient Active Problem List   Diagnosis Date Noted   Near syncope 41/32/4401   Acute metabolic encephalopathy 02/72/5366   Hypertensive urgency 10/19/2019   Acute encephalopathy 10/19/2019   Pain due to onychomycosis of toenails of both feet 02/21/2019   Adenomatous polyp 11/05/2018   Blood pressure elevated 11/05/2018   Neuropathy 11/05/2018   Pneumococcal vaccination given 11/05/2018   Pulmonary nodules 10/08/2018   Coronary artery disease 10/08/2018   CAP (community acquired  pneumonia) 09/01/2018   Overweight (BMI 25.0-29.9) 03/10/2018   Ataxia, 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   Benign neoplasm 01/19/2018   Onychomycosis due to dermatophyte 01/19/2018   Mononeuritis 01/19/2018   Neck pain 01/19/2018   Vitamin D deficiency 01/19/2018   Aortic stenosis, mild 01/05/2018   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 01/05/2018   Vertigo 12/17/2017   Uncontrolled diabetes mellitus with complication, with long-term current use of insulin ([place]) 12/17/2017   Hyperlipidemia 04/14/2016   Pain and swelling of left wrist 07/10/2015   Carotid artery disease ([place])    Heart murmur 10/03/2013   HTN (hypertension) 10/03/2013   CVA (cerebral vascular accident) ([place]) 10/03/2013   Carotid artery stenosis 10/03/2013    Past Surgical History:  Procedure Laterality Date   ARTHRODESIS     BACK SURGERY     x 2   discectomy     trigger release      Prior to Admission medications   Medication Sig Start Date End Date Taking? Authorizing Provider  Insulin Isophane & Regular Human (NOVOLIN 70/30 FLEXPEN) (70-30) 100 UNIT/ML PEN Inject 15 Units into the skin in the morning and at bedtime. 10/20/19  Yes Samuella Cota, MD  amLODipine (NORVASC) 10 MG tablet Take 1 tablet (10 mg total) by mouth daily. Appointment needed please for further refills Patient not taking: No  sig reported 10/20/19   Samuella Cota, MD  aspirin EC 81 MG tablet Take 81 mg by mouth daily. Patient not taking: Reported on 04/17/2021    [provider]  atorvastatin (LIPITOR) 40 MG tablet Take 1 tablet (40 mg total) by mouth at bedtime. Patient not taking: No sig reported 10/20/19   Samuella Cota, MD  B-D UF III MINI PEN NEEDLES 31G X 5 MM MISC use once daily 01/28/17   Roselee Nova, MD  blood glucose meter kit and supplies KIT Dispense based on patient and insurance preference. Use up to four times daily as directed. (FOR ICD-9 250.00, 250.01). 11/23/18   Poulose,  Bethel Born, NP  enalapril (VASOTEC) 20 MG tablet Take 1 tablet (20 mg total) by mouth daily. Patient not taking: No sig reported 10/20/19   Samuella Cota, MD  Lancets [facility] DELICA PLUS [id number]) MISC USE QID UTD 11/23/18   [provider]  meclizine (ANTIVERT) 25 MG tablet Take 1 tablet (25 mg total) by mouth 3 (three) times daily as needed for dizziness. Patient not taking: Reported on 04/17/2021 01/08/20   Arta Silence, MD  ONE TOUCH ULTRA TEST test strip USE QID UTD 11/23/18   [provider]    Allergies Patient has no known allergies.  Family History  Problem Relation Age of Onset   Heart disease Brother    Hypertension Mother     Social History Social History   Tobacco Use   Smoking status: Former    Packs/day: 1.00    Years: 10.00    Pack years: 10.00    Types: Cigarettes   Smokeless tobacco: Never  Vaping Use   Vaping Use: Never used  Substance Use Topics   Alcohol use: No   Drug use: No    Review of Systems  Constitutional: No fever/chills Eyes: No visual changes. ENT: No sore throat. Cardiovascular: Denies chest pain. Respiratory: Denies shortness of breath. Gastrointestinal: No abdominal pain.  No nausea, no vomiting.  No diarrhea.  No constipation. Genitourinary: Negative for dysuria. Musculoskeletal: Negative for back pain. Skin: Negative for rash. Neurological: Negative for headaches, focal weakness   ____________________________________________   PHYSICAL EXAM:  VITAL SIGNS: ED Triage Vitals  Enc Vitals Group     BP 04/17/21 1639 (!) 166/85     Pulse Rate 04/17/21 1639 64     Resp 04/17/21 1639 17     Temp 04/17/21 1639 98.1 F (36.7 C)     Temp Source 04/17/21 1639 Oral     SpO2 04/17/21 1639 99 %     Weight 04/17/21 1639 200 lb (90.7 kg)     Height 04/17/21 1639 6' (1.829 m)     Head Circumference --      Peak Flow --      Pain Score 04/17/21 1642 0     Pain Loc --      Pain Edu? --      Excl. in [place]?  --     Constitutional: Alert and oriented. Well appearing and in no acute distress. Eyes: Conjunctivae are normal. PER EOMI. Head: Atraumatic. Nose: No congestion/rhinnorhea. Mouth/Throat: Mucous membranes are moist.  Oropharynx non-erythematous. Neck: No stridor.  No cervical spine tenderness to palpation. Cardiovascular: Normal rate, regular rhythm. Grossly normal heart sounds.  Good peripheral circulation. Respiratory: Normal respiratory effort.  No retractions. Lungs CTAB. Gastrointestinal: Soft and nontender. No distention. No abdominal bruits. No CVA tenderness. Musculoskeletal: No lower extremity tenderness nor edema.  Neurologic:  Normal speech and language. No gross focal neurologic deficits are appreciated.  Cranial nerves II through XII are intact although visual fields were not checked cerebellar finger-nose is normal motor strength is 5/5 throughout patient does not report any numbness but when we try to stand patient have him walk he is leaning over to the left and cannot stand or walk without support.  He has difficulty moving his legs as well. Skin:  Skin is warm, dry and intact. No rash noted. Psychiatric: Mood and affect are normal. Speech and behavior are normal.  ____________________________________________   LABS (all labs ordered are listed, but only abnormal results are displayed)  Labs Reviewed  RESP PANEL BY RT-PCR (FLU A&B, COVID) ARPGX2 - Abnormal; Notable for the following components:      Result Value   SARS Coronavirus 2 by RT PCR POSITIVE (*)    All other components within normal limits  BASIC METABOLIC PANEL - Abnormal; Notable for the following components:   Potassium 3.3 (*)    Glucose, Bld 299 (*)    All other components within normal limits  CBC - Abnormal; Notable for the following components:   MCHC 36.2 (*)    All other components within normal limits  URINALYSIS, COMPLETE (UACMP) WITH MICROSCOPIC - Abnormal; Notable for the following  components:   Color, Urine YELLOW (*)    APPearance CLEAR (*)    Glucose, UA >=500 (*)    All other components within normal limits  HEMOGLOBIN A1C  CBC  BASIC METABOLIC PANEL   ____________________________________________  EKG  EKG read interpreted by me shows normal sinus rhythm rate of 63 normal axis no acute changes seen _EKG done in the lobby initially also normal sinus rhythm normal axis possible a flutter versus artifact though.  There is not rapid ventricular response.  ___________________________________________  RADIOLOGY Gertha Calkin, personally viewed and evaluated these images (plain radiographs) as part of my medical decision making, as well as reviewing the written report by the radiologist.  ED MD interpretation: CT read by radiologist reviewed by me does not show any acute problems  Official radiology report(s): CT HEAD WO CONTRAST (5MM)  Result Date: 04/17/2021 CLINICAL DATA:  Neuro deficit, acute, stroke suspected. Dizziness. Gait disturbance. EXAM: CT HEAD WITHOUT CONTRAST TECHNIQUE: Contiguous axial images were obtained from the base of the skull through the vertex without intravenous contrast. COMPARISON:  01/08/2020 FINDINGS: Brain: Advanced generalized brain volume loss. Advanced chronic small-vessel ischemic changes affecting the pons, thalami and cerebral hemispheric white matter. No discernible change since last year. No large vessel territory stroke. No mass, hemorrhage, hydrocephalus or extra-axial collection. Vascular: There is atherosclerotic calcification of the major vessels at the base of the brain. Skull: Negative Sinuses/Orbits: Clear/normal Other: None IMPRESSION: No acute finding or change since last year. Generalized brain volume loss with extensive chronic small-vessel ischemic changes throughout as described. Electronically Signed   By: Nelson Chimes M.D.   On: 04/17/2021 20:10     ____________________________________________   PROCEDURES  Procedure(s) performed (including Critical Care): Critical care time 25 minutes.  This includes talking to the patient reviewing his studies and labs and reviewing his old records I also stood him up with the nurse and tried to walk him and found that he was leaning to the left and could not even stand without being in danger of falling.  We had to lay him right back down.  Procedures   ____________________________________________   INITIAL IMPRESSION / ASSESSMENT AND PLAN /  ED      Patient appears to have had a central nervous insult on Saturday when he was unable to stand.  MRI is pending.  CT does not show anything.  I have not found anything else in the blood work etc.  While he has been here his blood pressures been going up we will give him some labetalol to control this and likely get him back on his home medications.   ----------------------------------------- 8:57 PM on 04/17/2021 ----------------------------------------- Patient's COVID test is just come back positive.  Will order chest x-ray.      ____________________________________________   FINAL CLINICAL IMPRESSION(S) / ED DIAGNOSES  Final diagnoses:  Unable to walk  Unable to balance when standing  Unable to stand up     ED Discharge Orders     None        Note:  This document was prepared using Dragon voice recognition software and may include unintentional dictation errors.    Nena Polio, MD 04/17/21 [phone]

## 2021-04-18 ENCOUNTER — Inpatient Hospital Stay: Payer: Medicare Other

## 2021-04-18 ENCOUNTER — Observation Stay: Payer: Medicare Other

## 2021-04-18 ENCOUNTER — Encounter: Payer: Self-pay | Admitting: Family Medicine

## 2021-04-18 ENCOUNTER — Other Ambulatory Visit: Payer: Self-pay | Admitting: Family Medicine

## 2021-04-18 DIAGNOSIS — R262 Difficulty in walking, not elsewhere classified: Secondary | ICD-10-CM

## 2021-04-18 DIAGNOSIS — R55 Syncope and collapse: Secondary | ICD-10-CM | POA: Diagnosis not present

## 2021-04-18 DIAGNOSIS — R29898 Other symptoms and signs involving the musculoskeletal system: Secondary | ICD-10-CM

## 2021-04-18 DIAGNOSIS — R296 Repeated falls: Secondary | ICD-10-CM | POA: Diagnosis present

## 2021-04-18 DIAGNOSIS — M62562 Muscle wasting and atrophy, not elsewhere classified, left lower leg: Secondary | ICD-10-CM | POA: Diagnosis present

## 2021-04-18 DIAGNOSIS — I6389 Other cerebral infarction: Secondary | ICD-10-CM | POA: Diagnosis not present

## 2021-04-18 DIAGNOSIS — Z79899 Other long term (current) drug therapy: Secondary | ICD-10-CM | POA: Diagnosis not present

## 2021-04-18 DIAGNOSIS — E1140 Type 2 diabetes mellitus with diabetic neuropathy, unspecified: Secondary | ICD-10-CM | POA: Diagnosis present

## 2021-04-18 DIAGNOSIS — E1169 Type 2 diabetes mellitus with other specified complication: Secondary | ICD-10-CM | POA: Diagnosis present

## 2021-04-18 DIAGNOSIS — I951 Orthostatic hypotension: Secondary | ICD-10-CM | POA: Diagnosis present

## 2021-04-18 DIAGNOSIS — Z7982 Long term (current) use of aspirin: Secondary | ICD-10-CM | POA: Diagnosis not present

## 2021-04-18 DIAGNOSIS — R42 Dizziness and giddiness: Secondary | ICD-10-CM

## 2021-04-18 DIAGNOSIS — U071 COVID-19: Secondary | ICD-10-CM | POA: Diagnosis not present

## 2021-04-18 DIAGNOSIS — E785 Hyperlipidemia, unspecified: Secondary | ICD-10-CM | POA: Diagnosis present

## 2021-04-18 DIAGNOSIS — R011 Cardiac murmur, unspecified: Secondary | ICD-10-CM | POA: Diagnosis not present

## 2021-04-18 DIAGNOSIS — R110 Nausea: Secondary | ICD-10-CM | POA: Diagnosis not present

## 2021-04-18 DIAGNOSIS — Z789 Other specified health status: Secondary | ICD-10-CM | POA: Diagnosis not present

## 2021-04-18 DIAGNOSIS — R29700 NIHSS score 0: Secondary | ICD-10-CM | POA: Diagnosis present

## 2021-04-18 DIAGNOSIS — E1165 Type 2 diabetes mellitus with hyperglycemia: Secondary | ICD-10-CM

## 2021-04-18 DIAGNOSIS — R278 Other lack of coordination: Secondary | ICD-10-CM

## 2021-04-18 DIAGNOSIS — R0902 Hypoxemia: Secondary | ICD-10-CM | POA: Diagnosis present

## 2021-04-18 DIAGNOSIS — R1310 Dysphagia, unspecified: Secondary | ICD-10-CM | POA: Diagnosis present

## 2021-04-18 DIAGNOSIS — I352 Nonrheumatic aortic (valve) stenosis with insufficiency: Secondary | ICD-10-CM | POA: Diagnosis present

## 2021-04-18 DIAGNOSIS — I160 Hypertensive urgency: Secondary | ICD-10-CM | POA: Diagnosis present

## 2021-04-18 DIAGNOSIS — M62561 Muscle wasting and atrophy, not elsewhere classified, right lower leg: Secondary | ICD-10-CM | POA: Diagnosis present

## 2021-04-18 DIAGNOSIS — I110 Hypertensive heart disease with heart failure: Secondary | ICD-10-CM | POA: Diagnosis present

## 2021-04-18 DIAGNOSIS — I639 Cerebral infarction, unspecified: Secondary | ICD-10-CM | POA: Diagnosis not present

## 2021-04-18 DIAGNOSIS — I6310 Cerebral infarction due to embolism of unspecified precerebral artery: Secondary | ICD-10-CM | POA: Diagnosis not present

## 2021-04-18 DIAGNOSIS — I6612 Occlusion and stenosis of left anterior cerebral artery: Secondary | ICD-10-CM | POA: Diagnosis not present

## 2021-04-18 DIAGNOSIS — I6523 Occlusion and stenosis of bilateral carotid arteries: Secondary | ICD-10-CM | POA: Diagnosis not present

## 2021-04-18 DIAGNOSIS — I060 Rheumatic aortic stenosis: Secondary | ICD-10-CM | POA: Diagnosis not present

## 2021-04-18 DIAGNOSIS — Z87891 Personal history of nicotine dependence: Secondary | ICD-10-CM | POA: Diagnosis not present

## 2021-04-18 DIAGNOSIS — R1110 Vomiting, unspecified: Secondary | ICD-10-CM | POA: Diagnosis not present

## 2021-04-18 DIAGNOSIS — I350 Nonrheumatic aortic (valve) stenosis: Secondary | ICD-10-CM | POA: Diagnosis not present

## 2021-04-18 DIAGNOSIS — R4189 Other symptoms and signs involving cognitive functions and awareness: Secondary | ICD-10-CM | POA: Diagnosis present

## 2021-04-18 DIAGNOSIS — I2510 Atherosclerotic heart disease of native coronary artery without angina pectoris: Secondary | ICD-10-CM | POA: Diagnosis present

## 2021-04-18 DIAGNOSIS — I5032 Chronic diastolic (congestive) heart failure: Secondary | ICD-10-CM | POA: Diagnosis present

## 2021-04-18 DIAGNOSIS — I6602 Occlusion and stenosis of left middle cerebral artery: Secondary | ICD-10-CM | POA: Diagnosis not present

## 2021-04-18 DIAGNOSIS — Z8673 Personal history of transient ischemic attack (TIA), and cerebral infarction without residual deficits: Secondary | ICD-10-CM | POA: Diagnosis not present

## 2021-04-18 DIAGNOSIS — Z981 Arthrodesis status: Secondary | ICD-10-CM | POA: Diagnosis not present

## 2021-04-18 DIAGNOSIS — I6503 Occlusion and stenosis of bilateral vertebral arteries: Secondary | ICD-10-CM | POA: Diagnosis not present

## 2021-04-18 DIAGNOSIS — I6381 Other cerebral infarction due to occlusion or stenosis of small artery: Secondary | ICD-10-CM | POA: Diagnosis present

## 2021-04-18 LAB — CBC
HCT: 40.9 % (ref 39.0–52.0)
Hemoglobin: 14.8 g/dL (ref 13.0–17.0)
MCH: 29.8 pg (ref 26.0–34.0)
MCHC: 36.2 g/dL — ABNORMAL HIGH (ref 30.0–36.0)
MCV: 82.5 fL (ref 80.0–100.0)
Platelets: 191 10*3/uL (ref 150–400)
RBC: 4.96 MIL/uL (ref 4.22–5.81)
RDW: 13.2 % (ref 11.5–15.5)
WBC: 7.8 10*3/uL (ref 4.0–10.5)
nRBC: 0 % (ref 0.0–0.2)

## 2021-04-18 LAB — COMPREHENSIVE METABOLIC PANEL
ALT: 13 U/L (ref 0–44)
AST: 16 U/L (ref 15–41)
Albumin: 3.7 g/dL (ref 3.5–5.0)
Alkaline Phosphatase: 75 U/L (ref 38–126)
Anion gap: 9 (ref 5–15)
BUN: 9 mg/dL (ref 8–23)
CO2: 28 mmol/L (ref 22–32)
Calcium: 8.9 mg/dL (ref 8.9–10.3)
Chloride: 101 mmol/L (ref 98–111)
Creatinine, Ser: 0.68 mg/dL (ref 0.61–1.24)
GFR, Estimated: >60 mL/min (ref >60)
Glucose, Bld: 219 mg/dL — ABNORMAL HIGH (ref 70–99)
Potassium: 3.2 mmol/L — ABNORMAL LOW (ref 3.5–5.1)
Sodium: 138 mmol/L (ref 135–145)
Total Bilirubin: 1 mg/dL (ref 0.3–1.2)
Total Protein: 6.4 g/dL — ABNORMAL LOW (ref 6.5–8.1)

## 2021-04-18 LAB — CBG MONITORING, ED
Glucose-Capillary: 205 mg/dL — ABNORMAL HIGH (ref 70–99)
Glucose-Capillary: 107 mg/dL — ABNORMAL HIGH (ref 70–99)
Glucose-Capillary: 230 mg/dL — ABNORMAL HIGH (ref 70–99)

## 2021-04-18 LAB — TSH: TSH: 1.137 u[IU]/mL (ref 0.350–4.500)

## 2021-04-18 LAB — HEMOGLOBIN A1C
Hgb A1c MFr Bld: 9.5 % — ABNORMAL HIGH (ref 4.8–5.6)
Mean Plasma Glucose: 225.95 mg/dL

## 2021-04-18 LAB — C-REACTIVE PROTEIN: CRP: 0.5 mg/dL (ref <1.0)

## 2021-04-18 LAB — MAGNESIUM: Magnesium: 2.2 mg/dL (ref 1.7–2.4)

## 2021-04-18 LAB — D-DIMER, QUANTITATIVE: D-Dimer, Quant: 0.5 ug/mL-FEU (ref 0.00–0.50)

## 2021-04-18 LAB — BRAIN NATRIURETIC PEPTIDE: B Natriuretic Peptide: 116.6 pg/mL — ABNORMAL HIGH (ref 0.0–100.0)

## 2021-04-18 LAB — GLUCOSE, CAPILLARY: Glucose-Capillary: 188 mg/dL — ABNORMAL HIGH (ref 70–99)

## 2021-04-18 IMAGING — CT CT ANGIO HEAD-NECK (W OR W/O PERF)
1 of 10 series · 6 of 33 positions shown · IV contrast (omnipaque)
Comparison: Recent CT and MR imaging

CLINICAL DATA: Small acute strokes on MRI brain

EXAM:
CT ANGIOGRAPHY HEAD AND NECK
TECHNIQUE: Multidetector CT imaging of the head and neck was performed using
the standard protocol during bolus administration of intravenous
contrast. Multiplanar CT image reconstructions and MIPs were
obtained to evaluate the vascular anatomy. Carotid stenosis
measurements (when applicable) are obtained utilizing NASCET
criteria, using the distal internal carotid diameter as the
denominator.
CONTRAST:  75mL OMNIPAQUE IOHEXOL 350 MG/ML SOLN

[Series 10: ax thin · axial · 0.43mm/px · z∈[-355,-84]mm · 6 of 388 slices shown]
[im 56/388  soft-tissue]
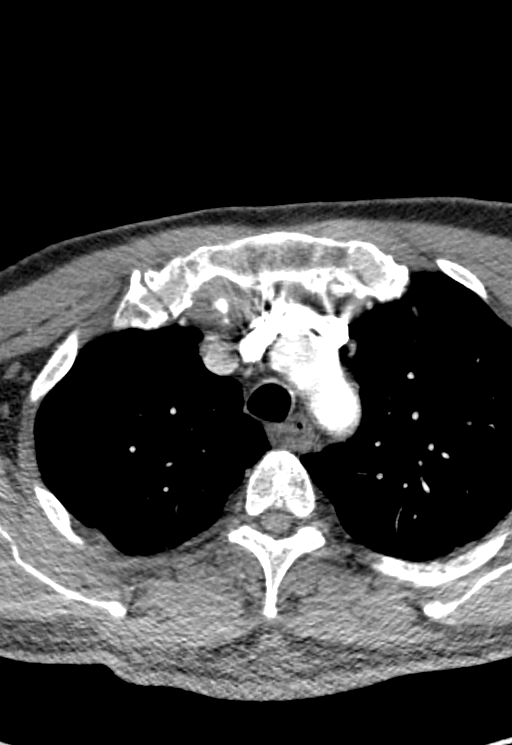
[im 111/388  bone]
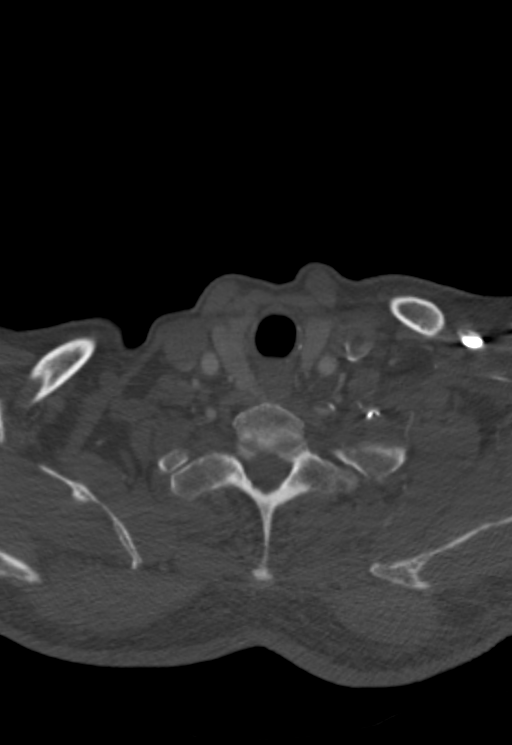
[im 166/388  soft-tissue]
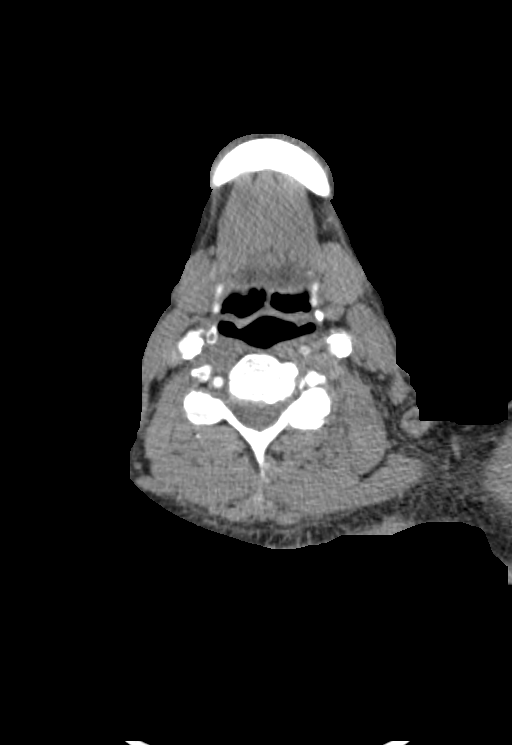
[im 222/388  bone]
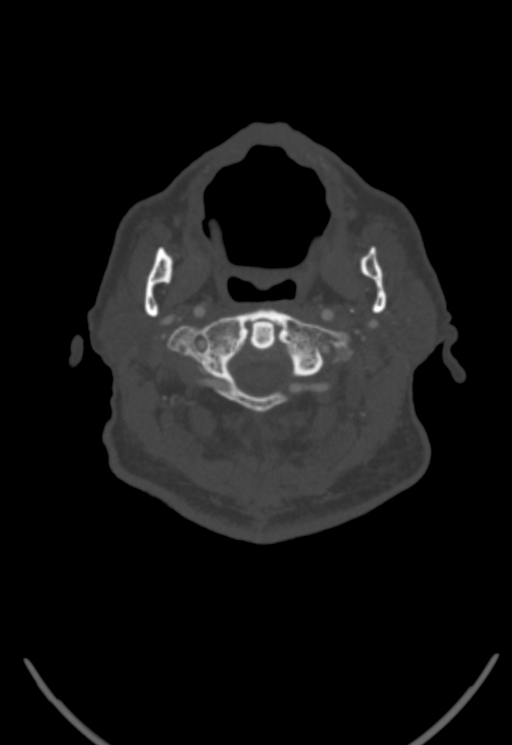
[im 277/388  soft-tissue]
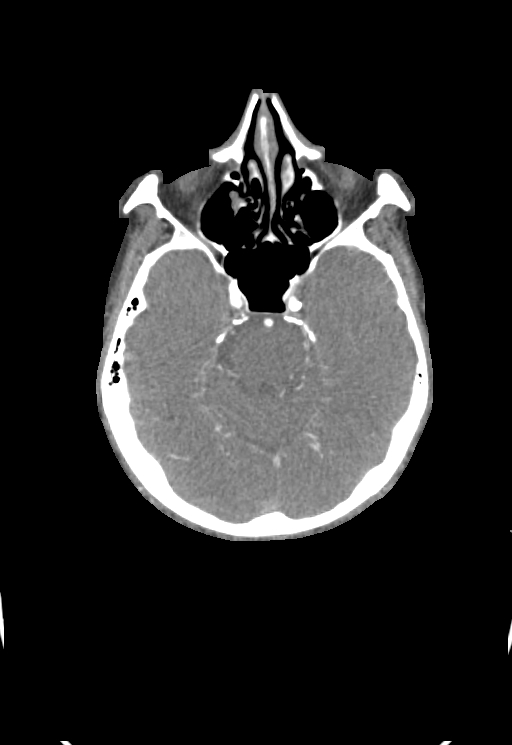
[im 332/388  bone]
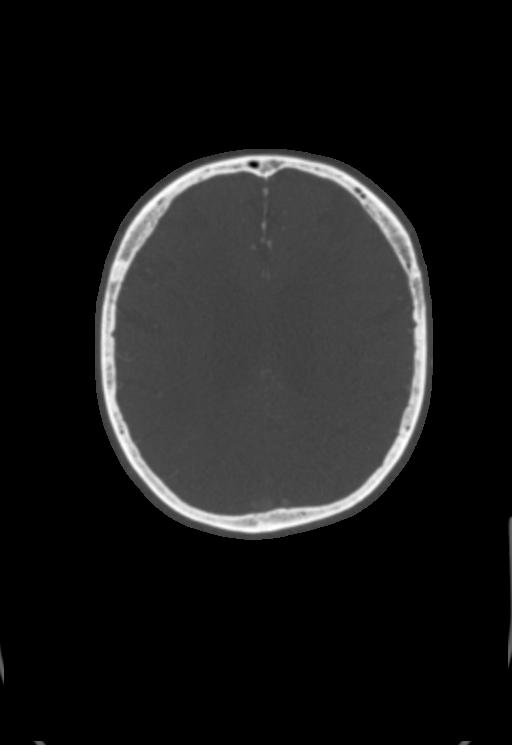

[6 of 33 positions shown; findings below may reference images not displayed]

FINDINGS: CT HEAD

Brain: Small infarcts on the MRI brain are not well seen. There is
no acute intracranial hemorrhage. No new loss of gray-white
differentiation. Stable findings of chronic microvascular ischemic
changes and parenchymal volume loss. Few chronic small vessel
infarcts including involvement of the right cerebellum and left
thalamus. Ventricles and sulci are stable in size and configuration.

Vascular: Better evaluated on CTA portion.

Skull: Calvarium is unremarkable.

Sinuses/Orbits: No acute finding.

Other: None.

Review of the MIP images confirms the above findings

CTA NECK

Aortic arch: Calcified plaque along the thoracic aorta. Great vessel
origins are patent.

Right carotid system: Patent. Calcified plaque along the distal
common carotid with minimal stenosis. Calcified plaque at the
bifurcation and along the proximal internal carotid. There is less
than 50% stenosis.

Left carotid system: Patent. Mixed plaque along the common carotid
with minimal stenosis. Calcified plaque along the proximal internal
carotid with less than 50% stenosis.

Vertebral arteries: Patent. Right vertebral artery is slightly
dominant. There is focal moderate to marked stenosis of the left
vertebral artery at the C4-C5 level.

Skeleton: Cervical spine degenerative changes. Prominent osteophyte
extends into the right ventral and lateral spinal canal at C5-C6.

Other neck: None.

Upper chest: Branching opacity at the left lung apex likely reflects
impacted airways.

Review of the MIP images confirms the above findings

CTA HEAD

Anterior circulation: Intracranial internal carotid arteries are
patent with calcified plaque causing mild stenosis. Anterior and
middle cerebral arteries are patent. Plaque causes mild stenosis of
the left M1 MCA. There is mild to moderate stenosis of the distal
left A2 ACA.

Posterior circulation: Intracranial vertebral arteries are patent
with calcified plaque bilaterally causing mild stenosis. Basilar
artery is patent. Major cerebellar artery origins are patent.
Posterior cerebral arteries are patent.

Venous sinuses: Patent as allowed by contrast bolus timing.

Review of the MIP images confirms the above findings
IMPRESSION: No acute intracranial abnormality. Small infarcts on brain MRI are
not seen.

No large vessel occlusion.

Plaque along the proximal internal carotids causes less than 50%
stenosis.

Mild intracranial atherosclerosis.

## 2021-04-18 IMAGING — US US CAROTID DUPLEX BILAT
1 series · 13 of 24 positions shown · non-contrast
Comparison: [DATE]

CLINICAL DATA: Near syncopal event

EXAM:
BILATERAL CAROTID DUPLEX ULTRASOUND
TECHNIQUE: Gray scale imaging, color Doppler and duplex ultrasound were
performed of bilateral carotid and vertebral arteries in the neck.

[Series 1: us carotid bilateral · 13 of 63 slices shown]
[im 1/63]
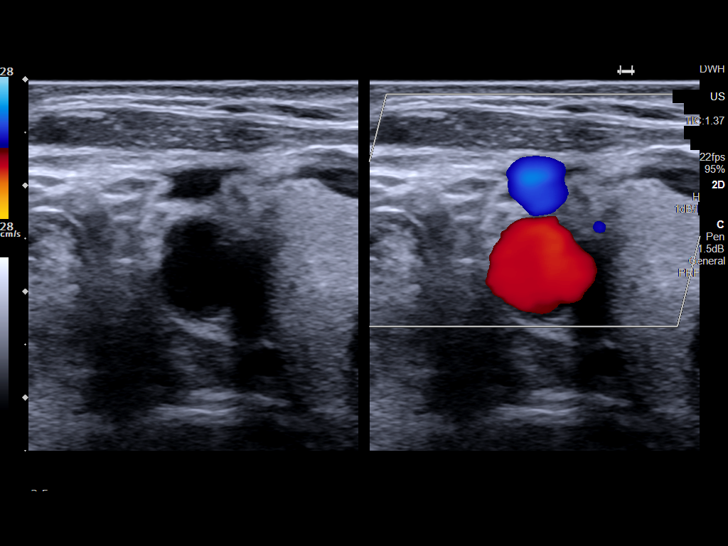
[im 6/63]
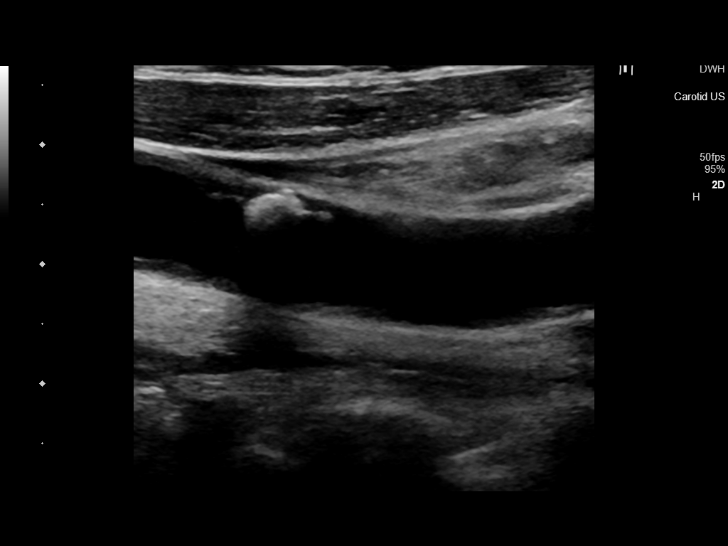
[im 11/63]
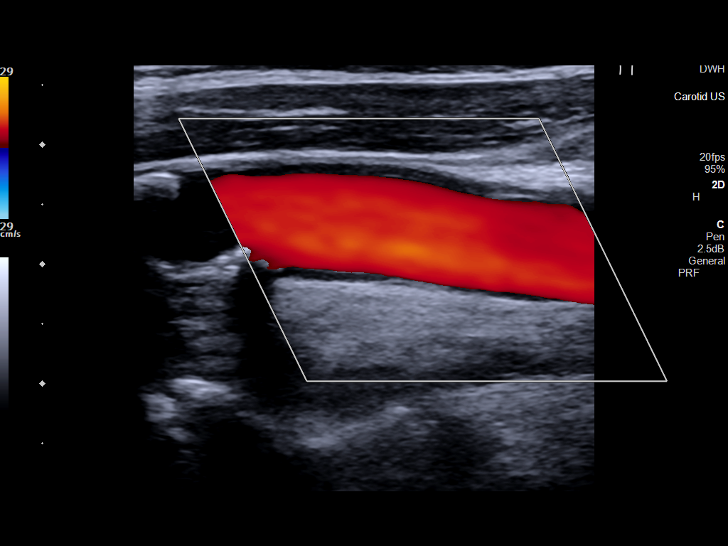
[im 17/63]
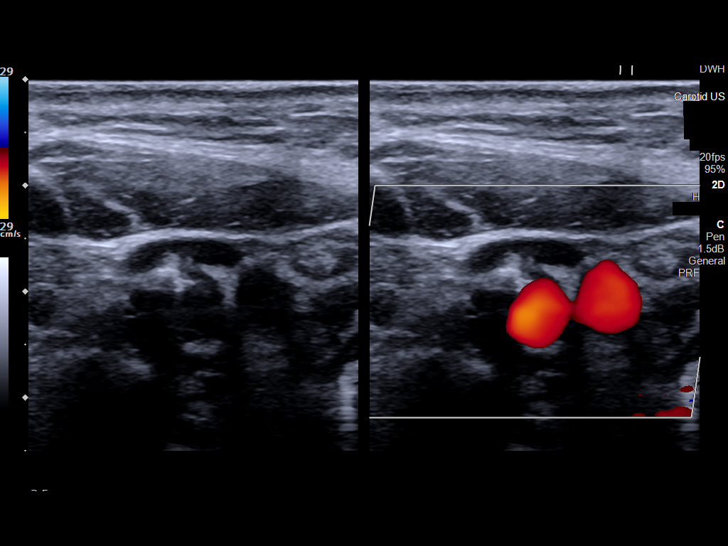
[im 22/63]
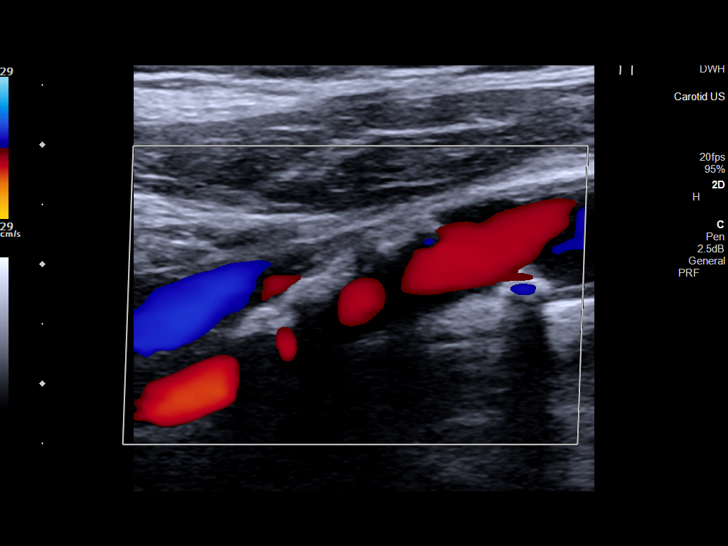
[im 27/63]
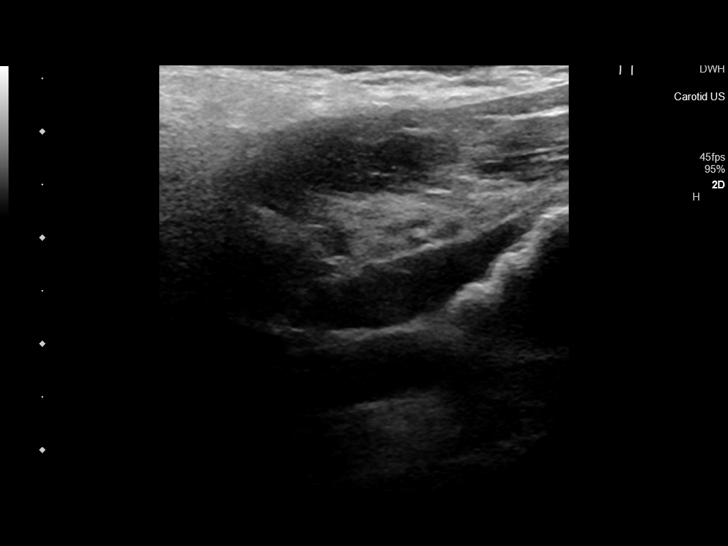
[im 33/63]
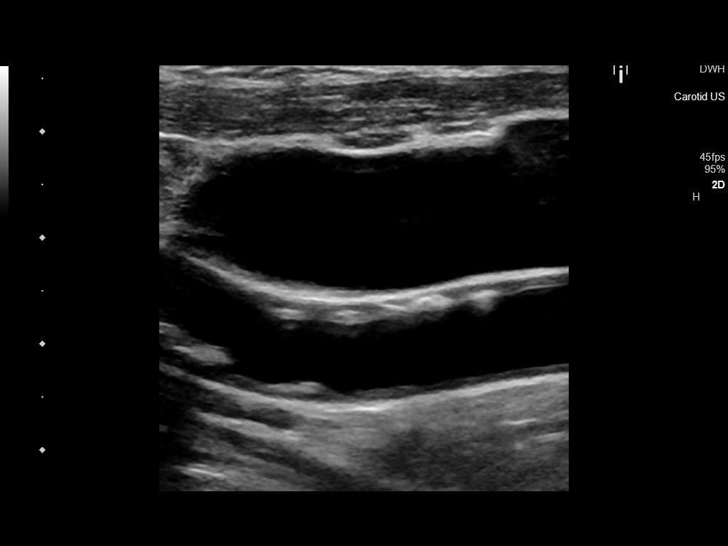
[im 36/63]
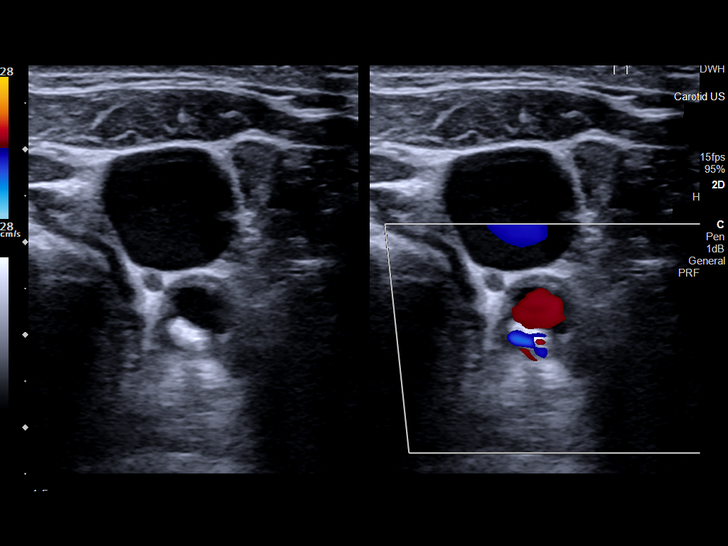
[im 41/63]
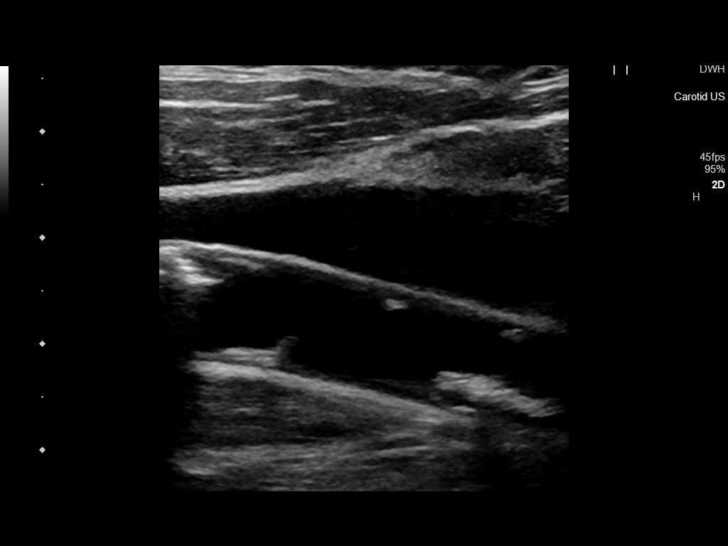
[im 46/63]
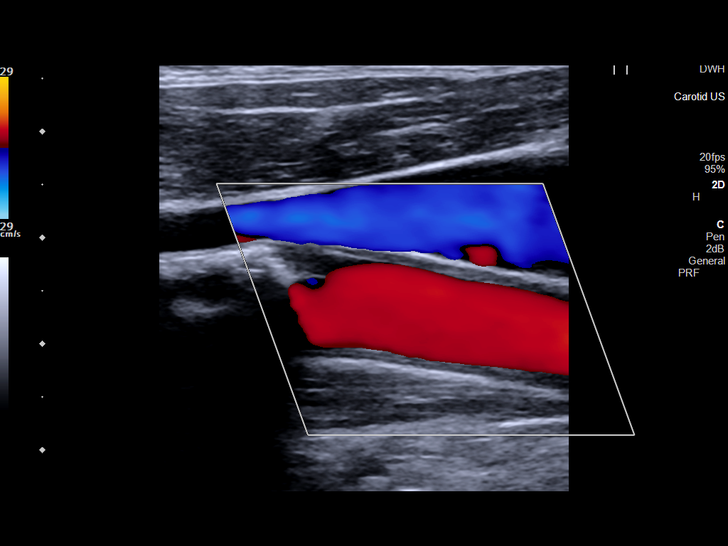
[im 52/63]
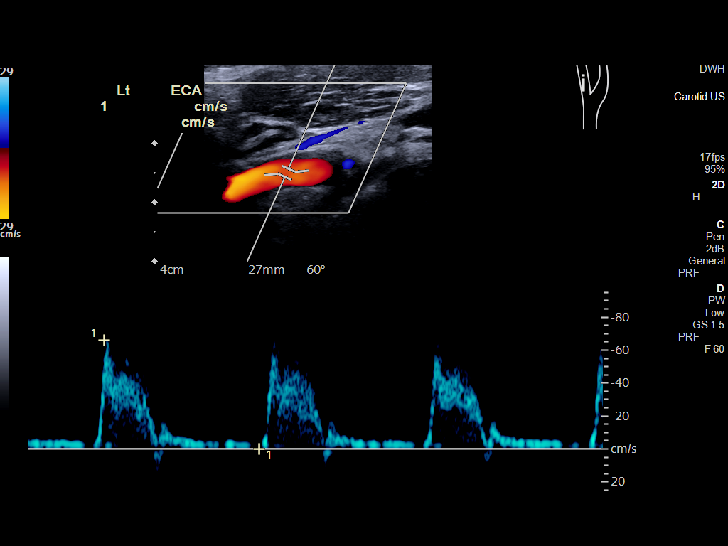
[im 57/63]
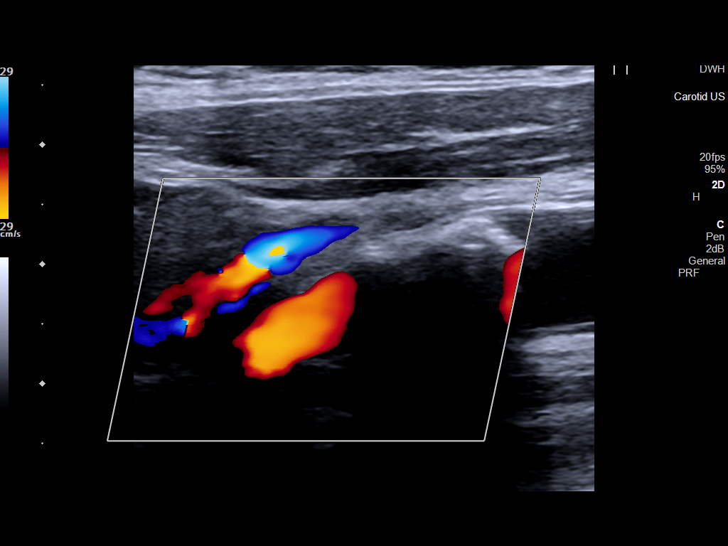
[im 63/63]
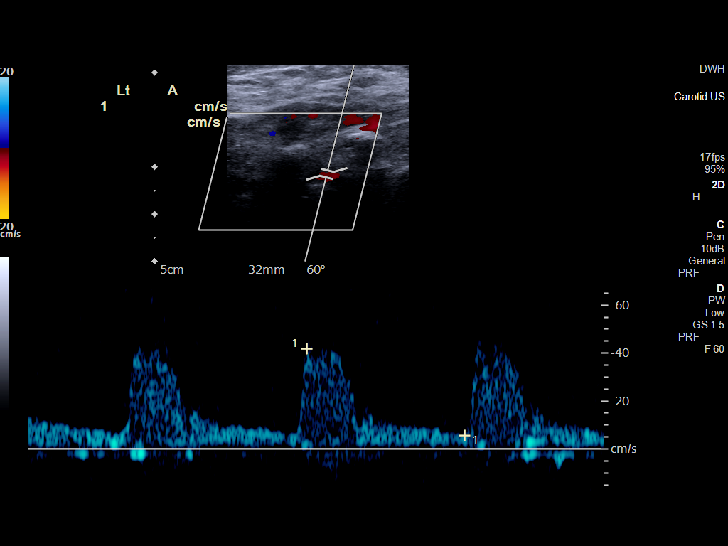

[13 of 24 positions shown; findings below may reference images not displayed]

FINDINGS: Criteria: Quantification of carotid stenosis is based on velocity
parameters that correlate the residual internal carotid diameter
with NASCET-based stenosis levels, using the diameter of the distal
internal carotid lumen as the denominator for stenosis measurement.

The following velocity measurements were obtained:

RIGHT

ICA: 78/10 cm/sec

CCA:  58/3 cm/sec

SYSTOLIC ICA/CCA RATIO:

ECA:  63 cm/sec

LEFT

ICA:  74/9 cm/sec

CCA:  57/5 cm/sec

SYSTOLIC ICA/CCA RATIO:

ECA:  66 cm/sec

RIGHT CAROTID ARTERY: Preliminary grayscale images demonstrate
scattered atherosclerotic plaque in the common and internal carotid
arteries. The waveforms, velocities and flow velocity ratios however
demonstrate no evidence of focal hemodynamically significant
stenosis.

RIGHT VERTEBRAL ARTERY:  Antegrade in nature.

LEFT CAROTID ARTERY: Preliminary grayscale images of the left
carotid system demonstrates scattered atherosclerotic plaque in the
common and internal carotid artery. The waveforms, velocities and
flow velocity ratios show no evidence of focal hemodynamically
significant stenosis.

LEFT VERTEBRAL ARTERY:  Antegrade in nature.
IMPRESSION: Extensive bilateral atherosclerotic plaque similar to that seen on
prior exams without focal hemodynamically significant stenosis.

## 2021-04-18 MED ORDER — IOHEXOL 350 MG/ML SOLN
75.0000 mL | Freq: Once | INTRAVENOUS | Status: AC | PRN
  Administered 2021-04-18: 75 mL via INTRAVENOUS

## 2021-04-18 MED ORDER — ALBUTEROL SULFATE HFA 108 (90 BASE) MCG/ACT IN AERS
2.0000 | INHALATION_SPRAY | Freq: Four times a day (QID) | RESPIRATORY_TRACT | Status: DC | PRN
  Filled 2021-04-18: qty 6.7

## 2021-04-18 MED ORDER — CLOPIDOGREL BISULFATE 75 MG PO TABS
75.0000 mg | ORAL_TABLET | Freq: Every day | ORAL | Status: AC
  Administered 2021-04-18 – 2021-05-08 (×21): 75 mg via ORAL
  Filled 2021-04-18 (×21): qty 1

## 2021-04-18 MED ORDER — STROKE: EARLY STAGES OF RECOVERY BOOK: Freq: Once | Status: AC

## 2021-04-18 NOTE — Progress Notes (Signed)
Inpatient Diabetes Program Recommendations  AACE/ADA: New Consensus Statement on Inpatient Glycemic Control (2015)  Target Ranges:  Prepandial:   less than 140 mg/dL      Peak postprandial:   less than 180 mg/dL (1-2 hours)      Critically ill patients:  140 - 180 mg/dL   Lab Results  Component Value Date   GLUCAP 230 (H) 04/18/2021   HGBA1C 9.5 (H) 04/18/2021    Review of Glycemic Control Results for Victor Fox, Victor Fox (MRN [id number]) as of 04/18/2021 14:52  Ref. Range 01/08/2020 14:22 04/17/2021 21:54 04/18/2021 07:29 04/18/2021 11:10 04/18/2021 13:01  Glucose-Capillary Latest Ref Range: 70 - 99 mg/dL 237 (H) 223 (H) 205 (H) 107 (H) 230 (H)   Diabetes history: DM 2 Outpatient Diabetes medications:  Novolin 70/30 15 units bid-Per note from pharmacy patient is struggling to see syringe for drawing up and administering insulin- Note states patient is guessing and that he does not know how to use meter Current orders for Inpatient glycemic control:  Novolog moderate tid with meals and HS Novolog mix 70/30 15 units bid  Inpatient Diabetes Program Recommendations:    Patient seen by DM coordinator in February of 2021.  At that time he stated that he buys insulin from [facility] and prefers pens when he can afford them.  Will follow-up once patient gets to the unit.  At this time agree with current orders.  Will need to make sure patient is capable of administering insulin and checking blood sugars at time of discharge.  Will follow.   Thanks,  Adah Perl, RN, BC-ADM Inpatient Diabetes Coordinator Pager [phone]  (8a-5p)

## 2021-04-18 NOTE — ED Notes (Signed)
Novolog 70/30 at pt's bedside

## 2021-04-18 NOTE — ED Notes (Signed)
Protective services requested once social worker assigned to contact them. Office number is [phone], Cell number is [phone]

## 2021-04-18 NOTE — Evaluation (Signed)
Physical Therapy Evaluation Patient Details Name: Victor Fox MRN: [id number] DOB: March 30, 1946 Today's Date: 04/18/2021   History of Present Illness  75 y.o. male with a past medical history of significant diabetes mellitus type 2, hypertension, hyperlipidemia, neuropathy, vertigo, carotid artery disease, coronary artery disease, history of CVA, former smoker, resented to the hospital with c/o unsteainess/falls starting 8/13, MRI shows ischemic infarct.  Clinical Impression  Pt was eager to work with PT and showed good effort.  He showed consistent unsteadiness and low grade LOBs as well as poor awareness with walker use and limitations though he was able to don socks and belt as well as wipe his bottom after having a bowel movement, all be it with plenty of extra time, close guarding and occasional assist to maintain balance.      Follow Up Recommendations SNF;Supervision for mobility/OOB    Equipment Recommendations  None recommended by PT    Recommendations for Other Services       Precautions / Restrictions Precautions Precautions: Fall Restrictions Weight Bearing Restrictions: No      Mobility  Bed Mobility Overal bed mobility: Needs Assistance Bed Mobility: Supine to Sit;Sit to Supine     Supine to sit: Min assist Sit to supine: Min assist   General bed mobility comments: Pt showed good effort with transitions to/from supine and did not need a lot of assist, however he consistently had slow LOBs/lean in bed during each effort    Transfers Overall transfer level: Needs assistance Equipment used: Rolling walker (2 wheeled) Transfers: Sit to/from Stand Sit to Stand: Min guard         General transfer comment: Pt was slow/had some struggle with getting up to standing (especially from low commode height) but did manage with great effort and heavy UE use  Ambulation/Gait Ambulation/Gait assistance: Min assist Gait Distance (Feet): 30 Feet Assistive device: Rolling  walker (2 wheeled)       General Gait Details: Pt with a slow, staggering gait with heavy reliance on the walker, often outside BOS of walker needing direct assist to use appropriately.  Did not have a lot of true LOBs but needed constant close guarding and constant cuing to be aware of positioning, posture, etc  Stairs            Wheelchair Mobility    Modified Rankin (Stroke Patients Only)       Balance Overall balance assessment: Needs assistance   Sitting balance-Leahy Scale: Fair Sitting balance - Comments: Pt with multple slow lateral leans that he occsaionally self corrected and occasionally needed assist to attain upright     Standing balance-Leahy Scale: Fair Standing balance comment: Pt with consistent unsteadiness in standing, statically with UE use he managed relatively well but dynamic balance with inconsistent and needed occasional direct assist                             Pertinent Vitals/Pain Pain Assessment: No/denies pain    Home Living Family/patient expects to be discharged to:: Unsure Living Arrangements: Alone Available Help at Discharge: Available PRN/intermittently (Friend bring food/groceries and drives, apparently stops in every other day or so)   Home Access: Ramped entrance       Home Equipment: Walker - 2 wheels;Cane - single point Additional Comments: Pt    Prior Function Level of Independence: Independent with assistive device(s)         Comments: Pt reports that he gets dressed and  does light ADLs on his own, does rely on friend for errands, MD visits, etc     Hand Dominance        Extremity/Trunk Assessment   Upper Extremity Assessment Upper Extremity Assessment: Generalized weakness (appeared = bilaterally with some decreased fine motor finger opposition)    Lower Extremity Assessment Lower Extremity Assessment: Overall WFL for tasks assessed;Generalized weakness (again grossly = b/l and functional)        Communication   Communication: No difficulties  Cognition Arousal/Alertness: Awake/alert Behavior During Therapy: WFL for tasks assessed/performed Overall Cognitive Status: Impaired/Different from baseline                                 General Comments: unsure of tue baseline, pleasant and able to hold conversation and gave seemingly good history though he did display occasional low grade confusion      General Comments      Exercises     Assessment/Plan    PT Assessment Patient needs continued PT services  PT Problem List Decreased strength;Decreased range of motion;Decreased activity tolerance;Decreased balance;Decreased mobility;Decreased coordination;Decreased cognition;Decreased knowledge of use of DME;Decreased safety awareness       PT Treatment Interventions DME instruction;Gait training;Functional mobility training;Therapeutic activities;Therapeutic exercise;Balance training;Cognitive remediation;Patient/family education    PT Goals (Current goals can be found in the Care Plan section)  Acute Rehab PT Goals Patient Stated Goal: get stronger PT Goal Formulation: With patient Time For Goal Achievement: 05/02/21 Potential to Achieve Goals: Fair    Frequency Min 2X/week   Barriers to discharge        Co-evaluation               AM-PAC PT "6 Clicks" Mobility  Outcome Measure Help needed turning from your back to your side while in a flat bed without using bedrails?: A Little Help needed moving from lying on your back to sitting on the side of a flat bed without using bedrails?: A Little Help needed moving to and from a bed to a chair (including a wheelchair)?: A Little Help needed standing up from a chair using your arms (e.g., wheelchair or bedside chair)?: A Little Help needed to walk in hospital room?: A Lot Help needed climbing 3-5 steps with a railing? : Total 6 Click Score: 15    End of Session Equipment Utilized During Treatment:  Gait belt Activity Tolerance: Patient tolerated treatment well Patient left: in bed;with call bell/phone within reach Nurse Communication: Mobility status PT Visit Diagnosis: Muscle weakness (generalized) (M62.81);Difficulty in walking, not elsewhere classified (R26.2);Unsteadiness on feet (R26.81)    Time: [id number] PT Time Calculation (min) (ACUTE ONLY): 31 min   Charges:   PT Evaluation $PT Eval Low Complexity: 1 Low PT Treatments $Therapeutic Activity: 8-22 mins        Kreg Shropshire, DPT 04/18/2021, 4:02 PM

## 2021-04-18 NOTE — ED Notes (Signed)
Physical therapy at bedside. Pt is alert and oriented at this time with no complaints. Pt awaiting room on floor. Pt vitals stable.
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
## 2021-04-18 NOTE — Evaluation (Signed)
Occupational Therapy Evaluation Patient Details Name: Victor Fox MRN: [id number] DOB: 1946-07-09 Today's Date: 04/18/2021    History of Present Illness 75 y.o. male with a past medical history of significant diabetes mellitus type 2, hypertension, hyperlipidemia, neuropathy, vertigo, carotid artery disease, coronary artery disease, history of CVA, former smoker, resented to the hospital with c/o unsteainess/falls starting 8/13, MRI shows ischemic infarct.   Clinical Impression   Victor Fox was seen for OT evaluation this date. Prior to hospital admission, pt was MOD I for mobility and ADLs. Pt lives alone with friend available PRN, states brings groceries. Pt presents to acute OT demonstrating impaired ADL performance and functional mobility 2/2 decreased activity tolerance and functional strength/ROM/balance deficits. Pt currently requires MIN A seated grooming tasks - assist for progressive posterior lean. MIN A + RW for ADL t/f - assist to manage RW for ~30 ft mobility - pt unable to maintain RW within ~65f or at midline w/o assist.   Pt demonstrates high falls risk, oriented to self and place as ER only. Pt would benefit from skilled OT to address noted impairments and functional limitations (see below for any additional details) in order to maximize safety and independence while minimizing falls risk and caregiver burden. Upon hospital discharge, recommend STR to maximize pt safety and return to PLOF.     Follow Up Recommendations  SNF    Equipment Recommendations  3 in 1 bedside commode    Recommendations for Other Services       Precautions / Restrictions Precautions Precautions: Fall Restrictions Weight Bearing Restrictions: No      Mobility Bed Mobility Overal bed mobility: Needs Assistance Bed Mobility: Supine to Sit;Sit to Supine     Supine to sit: Min assist Sit to supine: Min assist      Transfers Overall transfer level: Needs assistance Equipment used:  Rolling walker (2 wheeled) Transfers: Sit to/from Stand Sit to Stand: Min guard;From elevated surface         General transfer comment: pulls on RW to stand    Balance Overall balance assessment: Needs assistance Sitting-balance support: No upper extremity supported;Feet supported Sitting balance-Leahy Scale: Poor Sitting balance - Comments: Pt with multple slow posterior leans requiring assist   Standing balance support: Bilateral upper extremity supported Standing balance-Leahy Scale: Fair Standing balance comment: requires BUE support                           ADL either performed or assessed with clinical judgement   ADL Overall ADL's : Needs assistance/impaired                                       General ADL Comments: MIN A seated grooming tasks - assist for progressive posterior lean. MIN A + RW for ADL t/f - assist to manage RW      Pertinent Vitals/Pain Pain Assessment: No/denies pain     Hand Dominance     Extremity/Trunk Assessment Upper Extremity Assessment Upper Extremity Assessment: Generalized weakness;LUE deficits/detail LUE Deficits / Details: increased time for RAM and 5 finger oppposition LUE Coordination: decreased fine motor   Lower Extremity Assessment Lower Extremity Assessment: Overall WFL for tasks assessed       Communication Communication Communication: No difficulties   Cognition Arousal/Alertness: Awake/alert Behavior During Therapy: WFL for tasks assessed/performed Overall Cognitive Status: Impaired/Different from baseline Area of Impairment:  Orientation;Safety/judgement                 Orientation Level: Disoriented to;Place;Time       Safety/Judgement: Decreased awareness of safety;Decreased awareness of deficits     General Comments: Pt unable to state year/month, states location as ED but unable to state city or name of hospital.   General Comments       Exercises Exercises: Other  exercises Other Exercises Other Exercises: Pt educated re: OT role, DME recs, d/c recs, falls prevention Other Exercises: LBD, grooming, sup<>sit, sit<>stand, sitting/standing blanace/tolerance, ~30 ft mobility   Shoulder Instructions      Home Living Family/patient expects to be discharged to:: Private residence Living Arrangements: Alone Available Help at Discharge: Friend(s);Available PRN/intermittently (Friend bring food/groceries and drives, apparently stops in every other day or so)   Home Access: Ramped entrance     Home Layout: One level               Home Equipment: Walker - 2 wheels;Cane - single point   Additional Comments: Pt      Prior Functioning/Environment Level of Independence: Independent with assistive device(s)        Comments: Pt reports that he gets dressed and does light ADLs on his own, does rely on friend for errands, MD visits, etc. Endorses recent fall shx        OT Problem List: Decreased strength;Decreased activity tolerance;Impaired balance (sitting and/or standing);Decreased safety awareness      OT Treatment/Interventions: Self-care/ADL training;Therapeutic exercise;Energy conservation;DME and/or AE instruction;Therapeutic activities;Balance training;Patient/family education    OT Goals(Current goals can be found in the care plan section) Acute Rehab OT Goals Patient Stated Goal: get stronger OT Goal Formulation: With patient Time For Goal Achievement: 05/02/21 Potential to Achieve Goals: Good ADL Goals Pt Will Perform Grooming: with modified independence;standing Pt Will Transfer to Toilet: with modified independence;ambulating;regular height toilet Additional ADL Goal #1: Pt will Independently verbalize plan to implement x3 falls prevention strategies  OT Frequency: Min 1X/week   Barriers to D/C: Decreased caregiver support             AM-PAC OT "6 Clicks" Daily Activity     Outcome Measure Help from another person eating  meals?: None Help from another person taking care of personal grooming?: A Little Help from another person toileting, which includes using toliet, bedpan, or urinal?: A Little Help from another person bathing (including washing, rinsing, drying)?: A Little Help from another person to put on and taking off regular upper body clothing?: A Little Help from another person to put on and taking off regular lower body clothing?: A Little 6 Click Score: 19   End of Session Equipment Utilized During Treatment: Rolling walker Nurse Communication: Mobility status  Activity Tolerance: Patient tolerated treatment well Patient left: in bed;with call bell/phone within reach  OT Visit Diagnosis: Other abnormalities of gait and mobility (R26.89);Muscle weakness (generalized) (M62.81)                Time: 1450-1506 OT Time Calculation (min): 16 min Charges:  OT General Charges $OT Visit: 1 Visit OT Evaluation $OT Eval Low Complexity: 1 Low OT Treatments $Self Care/Home Management : 8-22 mins  Dessie Coma, M.S. OTR/L  04/18/21, 4:45 PM  ascom [phone]

## 2021-04-18 NOTE — Consult Note (Signed)
NEUROLOGY CONSULTATION NOTE   Date of service: April 18, 2021 Patient Name: Victor Fox MRN:  [id number] DOB:  [birth year] Reason for consult: dizziness Requesting physician: Lala Lund MD _ _ _   _ __   _ __ _ _  __ __   _ __   __ _  History of Present Illness    75 y.o. male with a past medical history of significant diabetes mellitus type 2, hypertension, hyperlipidemia, neuropathy, vertigo, carotid artery disease, coronary artery disease, history of CVA, former smoker, who presented to ED with complaint of dizziness. He is a very poor historian and unable to provide much more detail. Currently mildly dizzy without focal neurologic deficits. MRI brain showed small acute infarcts superior R frontal lobe and L medullary pyramid as well as numerous chronic microhemorrhages likely hypertensive (personal review). Initial EKG in ED showed a flutter vs artifact, repeat EKG showed NSR. Cardiology consulted.   Currently covid (+) with cough, normal WOB  Other stroke workup this admission: CTA H&N: no hemodynamically significant stenosis LDL, A1c, TTE pending   ROS   Per HPI; all other systems reviewed and are negative.  Past History   Past Medical History:  Diagnosis Date   Arthritis    Benign neoplasm of unspecified site    Carotid artery disease ([place])    a. carotid duplex 05/2015: less tahn 39% stenosis bilaterally, stable over the past year, recommend repeat imaging in 2 years (05/2017)   Compression fracture    a. L2   CVA (cerebral vascular accident) ([place]) 06/2012   Diabetes mellitus without complication ([place])    Heart murmur    a. echo 06/2012: EF >55%, LVH, aortic valve sclerosis   Hyperlipidemia    Hypertension    Neuropathy    Pneumonia    Tobacco abuse    a. smoked x 25 years   Vitamin D deficiency    Past Surgical History:  Procedure Laterality Date   ARTHRODESIS     BACK SURGERY     x 2   discectomy     trigger release     Family History  Problem  Relation Age of Onset   Heart disease Brother    Hypertension Mother    Social History   Socioeconomic History   Marital status: Single    Spouse name: Not on file   Number of children: 2   Years of education: Not on file   Highest education level: 12th grade  Occupational History    Comment: full time  Tobacco Use   Smoking status: Former    Packs/day: 1.00    Years: 10.00    Pack years: 10.00    Types: Cigarettes   Smokeless tobacco: Never  Vaping Use   Vaping Use: Never used  Substance and Sexual Activity   Alcohol use: No   Drug use: No   Sexual activity: Not Currently  Other Topics Concern   Not on file  Social History Narrative   Not on file   Social Determinants of Health   Financial Resource Strain: Not on file  Food Insecurity: Not on file  Transportation Needs: Not on file  Physical Activity: Not on file  Stress: Not on file  Social Connections: Not on file   No Known Allergies  Medications   Medications Prior to Admission  Medication Sig Dispense Refill Last Dose   Insulin Isophane & Regular Human (NOVOLIN 70/30 FLEXPEN) (70-30) 100 UNIT/ML PEN Inject 15 Units into the skin in  the morning and at bedtime. 15 mL 11 04/16/2021 at 2000   amLODipine (NORVASC) 10 MG tablet Take 1 tablet (10 mg total) by mouth daily. Appointment needed please for further refills (Patient not taking: No sig reported) 30 tablet 0 Not Taking   aspirin EC 81 MG tablet Take 81 mg by mouth daily. (Patient not taking: Reported on 04/17/2021)   Not Taking   atorvastatin (LIPITOR) 40 MG tablet Take 1 tablet (40 mg total) by mouth at bedtime. (Patient not taking: No sig reported) 30 tablet 0 Not Taking   B-D UF III MINI PEN NEEDLES 31G X 5 MM MISC use once daily 100 each PRN    blood glucose meter kit and supplies KIT Dispense based on patient and insurance preference. Use up to four times daily as directed. (FOR ICD-9 250.00, 250.01). 1 each 0    enalapril (VASOTEC) 20 MG tablet Take 1  tablet (20 mg total) by mouth daily. (Patient not taking: No sig reported) 30 tablet 0 Not Taking   Lancets (ONETOUCH DELICA PLUS [id number]) MISC USE QID UTD      meclizine (ANTIVERT) 25 MG tablet Take 1 tablet (25 mg total) by mouth 3 (three) times daily as needed for dizziness. (Patient not taking: Reported on 04/17/2021) 15 tablet 0 Not Taking   ONE TOUCH ULTRA TEST test strip USE QID UTD        Vitals   Vitals:   04/18/21 1430 04/18/21 1833 04/18/21 2034 04/18/21 2157  BP: 139/75 (!) 154/58 (!) 183/70 (!) 165/87  Pulse: 60 63 66 64  Resp: 15 16 19 17   Temp:   (!) 97.5 F (36.4 C) 97.6 F (36.4 C)  TempSrc:      SpO2: 95% 100% 97% 99%  Weight:      Height:         Body mass index is 27.12 kg/m.  Physical Exam   Physical Exam Gen: A&O x4, NAD HEENT: Atraumatic, normocephalic;mucous membranes moist; oropharynx clear, tongue without atrophy or fasciculations. Neck: Supple, trachea midline. Resp: CTAB, no w/r/r CV: RRR, no m/g/r; nml S1 and S2. 2+ symmetric peripheral pulses. Abd: soft/NT/ND; nabs x 4 quad Extrem: Nml bulk; no cyanosis, clubbing, or edema.  Neuro: *MS: A&O x4. Follows multi-step commands.  *Speech: fluid, nondysarthric, able to name and repeat *CN:    I: Deferred   II,III: PERRLA, VFF by confrontation, optic discs sharp   III,IV,VI: EOMI w/o nystagmus, no ptosis   V: Sensation intact from V1 to V3 to LT   VII: Eyelid closure was full.  Smile symmetric.   VIII: Hearing intact to voice   IX,X: Voice normal, palate elevates symmetrically    XI: SCM/trap 5/5 bilat   XII: Tongue protrudes midline, no atrophy or fasciculations   *Motor:   Normal bulk.  No tremor, rigidity or bradykinesia. No pronator drift.    Strength: Dlt Bic Tri WrE WrF FgS Gr HF KnF KnE PlF DoF    Left 5 5 5 5 5 5 5 5 5 5 5 5     Right 5 5 5 5 5 5 5 5 5 5 5 5     *Sensory: Intact to light touch, pinprick, temperature vibration throughout. Symmetric. Propioception intact bilat.  No  double-simultaneous extinction.  *Coordination:  Finger-to-nose, heel-to-shin, rapid alternating motions were intact. *Reflexes:  2+ and symmetric throughout without clonus; toes down-going bilat *Gait: deferred  NIHSS = 0   Labs   CBC:  Recent Labs  Lab 04/17/21 1645 04/18/21  0730  WBC 7.2 7.8  HGB 14.2 14.8  HCT 39.2 40.9  MCV 82.4 82.5  PLT 200 361    Basic Metabolic Panel:  Lab Results  Component Value Date   NA 138 04/18/2021   K 3.2 (L) 04/18/2021   CO2 28 04/18/2021   GLUCOSE 219 (H) 04/18/2021   BUN 9 04/18/2021   CREATININE 0.68 04/18/2021   CALCIUM 8.9 04/18/2021   GFRNONAA >60 04/18/2021   GFRAA >60 01/08/2020   Lipid Panel:  Lab Results  Component Value Date   LDLCALC 116 (H) 10/19/2019   HgbA1c:  Lab Results  Component Value Date   HGBA1C 9.5 (H) 04/18/2021   Urine Drug Screen:     Component Value Date/Time   LABOPIA NONE DETECTED 10/18/2019 2147   COCAINSCRNUR NONE DETECTED 10/18/2019 2147   LABBENZ NONE DETECTED 10/18/2019 2147   AMPHETMU NONE DETECTED 10/18/2019 2147   THCU NONE DETECTED 10/18/2019 2147   LABBARB NONE DETECTED 10/18/2019 2147    Alcohol Level     Component Value Date/Time   ETH <10 10/18/2019 2106     Impression   75 y.o. male with a past medical history of significant diabetes mellitus type 2, hypertension, hyperlipidemia, neuropathy, vertigo, carotid artery disease, coronary artery disease, history of CVA, former smoker, who presented to ED with complaint of dizziness. MRI brain showed small acute infarcts in both anterior and posterior circulation suggestive of possible central embolic source. ?a flutter vs artifact on initial EKG in ED, currently NSR. Cardiology consulted. Patient has chronic microhemorrhages (largest L cerebellum, but throughout both hemispheres) that appear hypertensive in nature. CROMIS-2 study suggests 3-4x higher risk of intracranial hemorrhage in patients anticoagulated for a fib or flutter  that have cerebral microhemorrhages vs those without microhemorrhages. However his CHADS-VASc score is 6 and is very high risk of ischemic stroke therefore if cardiology confirms a fib or flutter his microhemorrhages should not preclude anticoagulation when he is outside of the acute stroke setting.  Recommendations   - Permissive HTN x48 hrs from sx onset or until stroke ruled out by MRI goal BP <220/110. PRN labetalol or hydralazine if BP above these parameters. Avoid oral antihypertensives. - TTE pending - Check A1c and LDL + add statin per guidelines - ASA 13m daily +plavix 764mdaily x21 days f/b ASA 8172maily after that - Cardiology consulted for possible a flutter, see discussion in impression - q4 hr neuro checks - STAT head CT for any change in neuro exam - Tele - PT/OT/SLP - Stroke education - Amb referral to neurology upon discharge   Will continue to follow.  ______________________________________________________________________   Thank you for the opportunity to take part in the care of this patient. If you have any further questions, please contact the neurology consultation attending.  Signed,  ColSu MonksD [facility] [phone] 7pm- 7am, please page neurology on call as listed in [place]

## 2021-04-18 NOTE — Progress Notes (Signed)
PROGRESS NOTE                                                                                                                                                                                                             Patient Demographics:    Victor Fox, is a 75 y.o. male, DOB - [birth year], [id number]  Outpatient Primary MD for the patient is Patient, No Pcp Per (Inactive)    LOS - 0  Admit date - 04/17/2021    Chief Complaint  Patient presents with   Dizziness       Brief Narrative (HPI from H&P)   Victor Fox is a 75 y.o. male with a past medical history of significant diabetes mellitus type 2, hypertension, hyperlipidemia, neuropathy, vertigo, carotid artery disease, coronary artery disease, history of CVA, former smoker, resented to the hospital with chief complaints of dizziness upon sitting up and standing, in the ER initial work-up negative follow-up MRI shows ischemic infarcts along with loud systolic murmur.  He was admitted for further care.   Subjective:    Victor Fox today has, No headache, No chest pain, No abdominal pain - No Nausea, No new weakness tingling or numbness, no SOB, still dizzy upon sitting up.   Assessment  & Plan :     Acute on chronic dizziness mostly postural  with positive orthostatics - he does have acute CVA however he is nearly orthostatic with a very loud aortic systolic murmur.  Some chronic dizziness as well and this could be multifactorial, for now we will stabilize his blood pressure, check echocardiogram to evaluate the heart murmur, will also request cardiology to evaluate, stroke work-up as below.  2.  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  Could be small vessel but question if this could be embolic, monitor on telemetry, EKG nonacute, no previous history of dysrhythmia, was on aspirin and have added Plavix for now, continue statin, neurology  consult, carotid noted with diffuse disease, check echocardiogram, and LDL, A1c in poor control, PT-OT and speech to evaluate.    3.  Incidental COVID infection.  Inflammatory markers stable.  Could have contributed to CVA, for now aspirin Plavix continue and monitor clinically.  No pulmonary symptoms   4.  Hypertension with orthostatic hypotension.  For now hold ACE inhibitor allow for permissive hypertension due  to acute stroke.  5. ?  A flutter versus artifact on initial EKG in the ER, subsequent EKG within half an hour was sinus rhythm.  Cardiology to opine, TSH stable, echo pending.  If a flutter is proven he will require anticoagulation due to recent stroke which could then be deemed as embolic.   6.  Loud systolic murmur.  Check echocardiogram.  Question if he has advanced aortic stenosis.    7. Dyslipidemia.  On statin a.m. lipid panel ordered.  8. Underlying history of CAD.  Chest pain-free.  Currently on aspirin Plavix and statin.  Continue.    9. DM type II.  On Lantus and sliding scale.  A1c suggests poor outpatient control due to hyperglycemia.  Diabetic education  Lab Results  Component Value Date   HGBA1C 9.5 (H) 04/18/2021   CBG (last 3)  Recent Labs    04/18/21 0729 04/18/21 1110 04/18/21 1301  GLUCAP 205* 107* 230*             Condition -  Guarded  Family Communication  :  None present  Code Status :  Full  Consults  :  Cards, Neuro  PUD Prophylaxis :    Procedures  :     MRI -  1. Small acute infarcts of the left medullary pyramid and superior right frontal lobe. No acute hemorrhage or mass effect. 2. Numerous chronic microhemorrhages, predominantly central. This likely indicates chronic hypertensive angiopathy. 3. Advanced atrophy and chronic small vessel ischemic changes.  Carotid US - Extensive bilateral atherosclerotic plaque similar to that seen on prior exams without focal hemodynamically significant stenosis.  TTE      Disposition Plan   :    Status is: Inpatient  Remains inpatient appropriate because:IV treatments appropriate due to intensity of illness or inability to take PO  Dispo: The patient is from: Home              Anticipated d/c is to: Home              Patient currently is not medically stable to d/c.   Difficult to place patient No  DVT Prophylaxis  :    enoxaparin (LOVENOX) injection 40 mg Start: 04/17/21 2200     Lab Results  Component Value Date   PLT 191 04/18/2021    Diet :  Diet Order             Diet Carb Modified Fluid consistency: Thin; Room service appropriate? Yes  Diet effective now                    Inpatient Medications  Scheduled Meds:   stroke: mapping our early stages of recovery book   Does not apply Once   aspirin EC  81 mg Oral Daily   atorvastatin  40 mg Oral QHS   clopidogrel  75 mg Oral Daily   enoxaparin (LOVENOX) injection  40 mg Subcutaneous Q24H   insulin aspart  0-15 Units Subcutaneous TID WC   insulin aspart  0-5 Units Subcutaneous QHS   insulin aspart protamine- aspart  15 Units Subcutaneous BID WC   Continuous Infusions: PRN Meds:.albuterol, meclizine  Antibiotics  :    Anti-infectives (From admission, onward)    Start     Dose/Rate Route Frequency Ordered Stop   04/17/21 2230  nirmatrelvir/ritonavir EUA (PAXLOVID) 3 tablet  Status:  Discontinued        3 tablet Oral 2 times daily 04/17/21 2218 04/17/21 2225  Time Spent in minutes  30   Victor Fox M.D on 04/18/2021 at 1:10 PM  To page go to [email or web address]   [facility] -  Office  [phone]    See all Orders from today for further details    Objective:   Vitals:   04/18/21 1000 04/18/21 1100 04/18/21 1116 04/18/21 1200  BP: (!) 130/100 (!) 141/61  (!) 116/93  Pulse: 76 (!) 182 81 82  Resp: '12 15  16  '$ Temp:      TempSrc:      SpO2: 100% (!) 81% 97% 99%  Weight:      Height:        Wt Readings from Last 3 Encounters:  04/17/21 90.7 kg  10/18/19 90.7 kg   11/23/18 99.2 kg     Intake/Output Summary (Last 24 hours) at 04/18/2021 1310 Last data filed at 04/17/2021 2056 Gross per 24 hour  Intake --  Output 400 ml  Net -400 ml     Physical Exam  Awake Alert, No new F.N deficits, Normal affect [name],PERRAL Supple Neck,No JVD, No cervical lymphadenopathy appriciated.  Symmetrical Chest wall movement, Good air movement bilaterally, CTAB RRR, no gallops, loud aortic systolic murmur +ve B.Sounds, Abd Soft, No tenderness, No organomegaly appriciated, No rebound - guarding or rigidity. No Cyanosis, Clubbing or edema, No new Rash or bruise      Data Review:    CBC Recent Labs  Lab 04/17/21 1645 04/18/21 0730  WBC 7.2 7.8  HGB 14.2 14.8  HCT 39.2 40.9  PLT 200 191  MCV 82.4 82.5  MCH 29.8 29.8  MCHC 36.2* 36.2*  RDW 13.1 13.2    Recent Labs  Lab 04/17/21 1645 04/18/21 0730  NA 136 138  K 3.3* 3.2*  CL 102 101  CO2 27 28  GLUCOSE 299* 219*  BUN 10 9  CREATININE 0.80 0.68  CALCIUM 8.9 8.9  AST  --  16  ALT  --  13  ALKPHOS  --  75  BILITOT  --  1.0  ALBUMIN  --  3.7  MG  --  2.2  CRP  --  0.5  DDIMER  --  0.50  TSH  --  1.137  HGBA1C  --  9.5*  BNP  --  116.6*    ------------------------------------------------------------------------------------------------------------------ No results for input(s): CHOL, HDL, LDLCALC, TRIG, CHOLHDL, LDLDIRECT in the last 72 hours.  Lab Results  Component Value Date   HGBA1C 9.5 (H) 04/18/2021   ------------------------------------------------------------------------------------------------------------------ Recent Labs    04/18/21 0730  TSH 1.137    Cardiac Enzymes No results for input(s): CKMB, TROPONINI, MYOGLOBIN in the last 168 hours.  Invalid input(s): CK ------------------------------------------------------------------------------------------------------------------    Component Value Date/Time   BNP 116.6 (H) 04/18/2021 0730      Radiology  Reports CT HEAD WO CONTRAST (5MM)  Result Date: 04/17/2021 CLINICAL DATA:  Neuro deficit, acute, stroke suspected. Dizziness. Gait disturbance. EXAM: CT HEAD WITHOUT CONTRAST TECHNIQUE: Contiguous axial images were obtained from the base of the skull through the vertex without intravenous contrast. COMPARISON:  01/08/2020 FINDINGS: Brain: Advanced generalized brain volume loss. Advanced chronic small-vessel ischemic changes affecting the pons, thalami and cerebral hemispheric white matter. No discernible change since last year. No large vessel territory stroke. No mass, hemorrhage, hydrocephalus or extra-axial collection. Vascular: There is atherosclerotic calcification of the major vessels at the base of the brain. Skull: Negative Sinuses/Orbits: Clear/normal Other: None IMPRESSION: No acute finding or change since last year. Generalized brain volume loss with  extensive chronic small-vessel ischemic changes throughout as described. Electronically Signed   By: Nelson Chimes M.D.   On: 04/17/2021 20:10   MR BRAIN WO CONTRAST  Result Date: 04/17/2021 CLINICAL DATA:  Dizziness EXAM: MRI HEAD WITHOUT CONTRAST TECHNIQUE: Multiplanar, multiecho pulse sequences of the brain and surrounding structures were obtained without intravenous contrast. COMPARISON:  10/18/2019 FINDINGS: Brain: Small focus of abnormal diffusion restriction within the left medullary pyramid. There is also a small focus of ischemia in the superior right frontal lobe. Numerous chronic microhemorrhages, predominantly central. Hyperintense T2-weighted signal is moderately widespread throughout the white matter. Advanced atrophy for age. The midline structures are normal. Vascular: Major flow voids are preserved. Skull and upper cervical spine: Normal calvarium and skull base. Visualized upper cervical spine and soft tissues are normal. Sinuses/Orbits:No paranasal sinus fluid levels or advanced mucosal thickening. No mastoid or middle ear effusion.  Normal orbits. IMPRESSION: 1. Small acute infarcts of the left medullary pyramid and superior right frontal lobe. No acute hemorrhage or mass effect. 2. Numerous chronic microhemorrhages, predominantly central. This likely indicates chronic hypertensive angiopathy. 3. Advanced atrophy and chronic small vessel ischemic changes. Electronically Signed   By: Ulyses Jarred M.D.   On: 04/17/2021 22:03   US Carotid Bilateral  Result Date: 04/18/2021 CLINICAL DATA:  Near syncopal event EXAM: BILATERAL CAROTID DUPLEX ULTRASOUND TECHNIQUE: Pearline Cables scale imaging, color Doppler and duplex ultrasound were performed of bilateral carotid and vertebral arteries in the neck. COMPARISON:  10/19/2019 FINDINGS: Criteria: Quantification of carotid stenosis is based on velocity parameters that correlate the residual internal carotid diameter with NASCET-based stenosis levels, using the diameter of the distal internal carotid lumen as the denominator for stenosis measurement. The following velocity measurements were obtained: RIGHT ICA: 78/10 cm/sec CCA:  [id number] cm/sec SYSTOLIC ICA/CCA RATIO:  1.7 ECA:  63 cm/sec LEFT ICA:  74/9 cm/sec CCA:  [id number] cm/sec SYSTOLIC ICA/CCA RATIO:  1.4 ECA:  66 cm/sec RIGHT CAROTID ARTERY: Preliminary grayscale images demonstrate scattered atherosclerotic plaque in the common and internal carotid arteries. The waveforms, velocities and flow velocity ratios however demonstrate no evidence of focal hemodynamically significant stenosis. RIGHT VERTEBRAL ARTERY:  Antegrade in nature. LEFT CAROTID ARTERY: Preliminary grayscale images of the left carotid system demonstrates scattered atherosclerotic plaque in the common and internal carotid artery. The waveforms, velocities and flow velocity ratios show no evidence of focal hemodynamically significant stenosis. LEFT VERTEBRAL ARTERY:  Antegrade in nature. IMPRESSION: Extensive bilateral atherosclerotic plaque similar to that seen on prior exams without focal  hemodynamically significant stenosis. Electronically Signed   By: Inez Catalina M.D.   On: 04/18/2021 01:20   DG Chest Portable 1 View  Result Date: 04/17/2021 CLINICAL DATA:  COVID-19 positivity with dizziness, initial encounter EXAM: PORTABLE CHEST 1 VIEW COMPARISON:  10/08/2018 FINDINGS: Cardiac shadow is within normal limits. Aortic calcifications are seen. Lungs are clear bilaterally. Postsurgical changes in the thoracolumbar spine are noted. IMPRESSION: No active disease. Electronically Signed   By: Inez Catalina M.D.   On: 04/17/2021 21:41

## 2021-04-18 NOTE — ED Notes (Signed)
Pt resting in bed asleep awaiting room on floor. Pt vitals stable at this time. Call light within reach.

## 2021-04-19 ENCOUNTER — Inpatient Hospital Stay: Payer: Medicare Other

## 2021-04-19 ENCOUNTER — Inpatient Hospital Stay (HOSPITAL_COMMUNITY)
Admit: 2021-04-19 | Discharge: 2021-04-19 | Disposition: A | Discharge status: Home / Self Care | Payer: Medicare Other | Attending: Cardiovascular Disease | Admitting: Cardiovascular Disease

## 2021-04-19 DIAGNOSIS — R011 Cardiac murmur, unspecified: Secondary | ICD-10-CM | POA: Diagnosis not present

## 2021-04-19 DIAGNOSIS — R262 Difficulty in walking, not elsewhere classified: Secondary | ICD-10-CM | POA: Diagnosis not present

## 2021-04-19 DIAGNOSIS — I639 Cerebral infarction, unspecified: Secondary | ICD-10-CM | POA: Diagnosis not present

## 2021-04-19 DIAGNOSIS — R278 Other lack of coordination: Secondary | ICD-10-CM | POA: Diagnosis not present

## 2021-04-19 DIAGNOSIS — Z789 Other specified health status: Secondary | ICD-10-CM

## 2021-04-19 DIAGNOSIS — R55 Syncope and collapse: Secondary | ICD-10-CM | POA: Diagnosis not present

## 2021-04-19 LAB — CBC WITH DIFFERENTIAL/PLATELET
Abs Immature Granulocytes: 0.04 10*3/uL (ref 0.00–0.07)
Basophils Absolute: 0 10*3/uL (ref 0.0–0.1)
Basophils Relative: 0 %
Eosinophils Absolute: 0.2 10*3/uL (ref 0.0–0.5)
Eosinophils Relative: 2 %
HCT: 42.4 % (ref 39.0–52.0)
Hemoglobin: 14.9 g/dL (ref 13.0–17.0)
Immature Granulocytes: 1 %
Lymphocytes Relative: 32 %
Lymphs Abs: 2.7 10*3/uL (ref 0.7–4.0)
MCH: 28.9 pg (ref 26.0–34.0)
MCHC: 35.1 g/dL (ref 30.0–36.0)
MCV: 82.3 fL (ref 80.0–100.0)
Monocytes Absolute: 0.5 10*3/uL (ref 0.1–1.0)
Monocytes Relative: 7 %
Neutro Abs: 4.9 10*3/uL (ref 1.7–7.7)
Neutrophils Relative %: 58 %
Platelets: 208 10*3/uL (ref 150–400)
RBC: 5.15 MIL/uL (ref 4.22–5.81)
RDW: 13.3 % (ref 11.5–15.5)
WBC: 8.3 10*3/uL (ref 4.0–10.5)
nRBC: 0 % (ref 0.0–0.2)

## 2021-04-19 LAB — BRAIN NATRIURETIC PEPTIDE: B Natriuretic Peptide: 60.1 pg/mL (ref 0.0–100.0)

## 2021-04-19 LAB — COMPREHENSIVE METABOLIC PANEL
ALT: 11 U/L (ref 0–44)
AST: 15 U/L (ref 15–41)
Albumin: 3.4 g/dL — ABNORMAL LOW (ref 3.5–5.0)
Alkaline Phosphatase: 75 U/L (ref 38–126)
Anion gap: 7 (ref 5–15)
BUN: 9 mg/dL (ref 8–23)
CO2: 28 mmol/L (ref 22–32)
Calcium: 8.8 mg/dL — ABNORMAL LOW (ref 8.9–10.3)
Chloride: 105 mmol/L (ref 98–111)
Creatinine, Ser: 0.87 mg/dL (ref 0.61–1.24)
GFR, Estimated: >60 mL/min (ref >60)
Glucose, Bld: 171 mg/dL — ABNORMAL HIGH (ref 70–99)
Potassium: 3.1 mmol/L — ABNORMAL LOW (ref 3.5–5.1)
Sodium: 140 mmol/L (ref 135–145)
Total Bilirubin: 1 mg/dL (ref 0.3–1.2)
Total Protein: 6.1 g/dL — ABNORMAL LOW (ref 6.5–8.1)

## 2021-04-19 LAB — GLUCOSE, CAPILLARY
Glucose-Capillary: 185 mg/dL — ABNORMAL HIGH (ref 70–99)
Glucose-Capillary: 177 mg/dL — ABNORMAL HIGH (ref 70–99)
Glucose-Capillary: 136 mg/dL — ABNORMAL HIGH (ref 70–99)
Glucose-Capillary: 197 mg/dL — ABNORMAL HIGH (ref 70–99)

## 2021-04-19 LAB — LIPID PANEL
Cholesterol: 162 mg/dL (ref 0–200)
HDL: 36 mg/dL — ABNORMAL LOW (ref >40)
LDL Cholesterol: 103 mg/dL — ABNORMAL HIGH (ref 0–99)
Total CHOL/HDL Ratio: 4.5 RATIO
Triglycerides: 116 mg/dL (ref <150)
VLDL: 23 mg/dL (ref 0–40)

## 2021-04-19 LAB — C-REACTIVE PROTEIN: CRP: 0.6 mg/dL (ref <1.0)

## 2021-04-19 LAB — MAGNESIUM: Magnesium: 2.2 mg/dL (ref 1.7–2.4)

## 2021-04-19 LAB — D-DIMER, QUANTITATIVE: D-Dimer, Quant: 0.57 ug/mL-FEU — ABNORMAL HIGH (ref 0.00–0.50)

## 2021-04-19 IMAGING — DX DG ABD PORTABLE 1V
2 series · 2 of 2 positions shown · non-contrast
Comparison: None.

CLINICAL DATA: Nausea and vomiting.  [L6] viral infection.

EXAM:
PORTABLE ABDOMEN - 1 VIEW

[abdomen supine (1 of 2)]
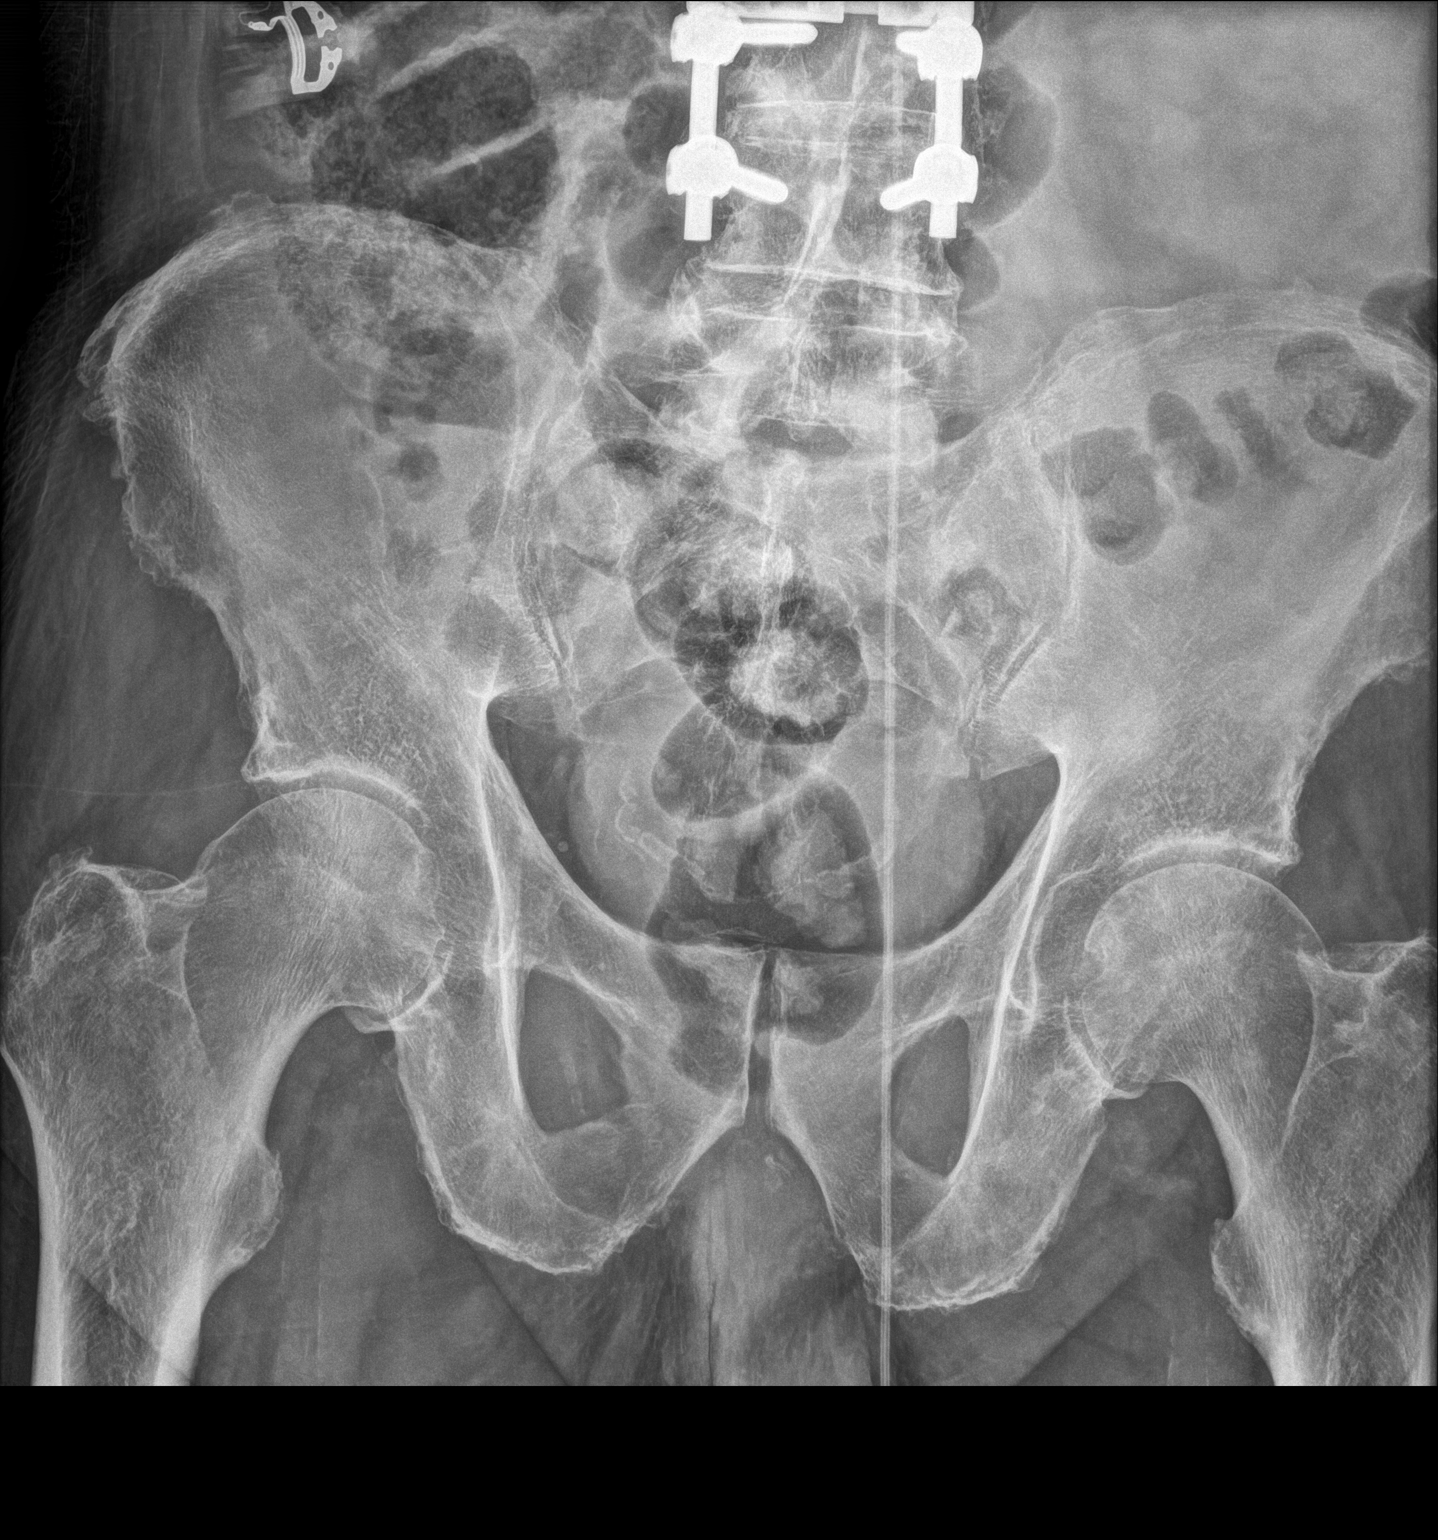

[abdomen supine (2 of 2)]
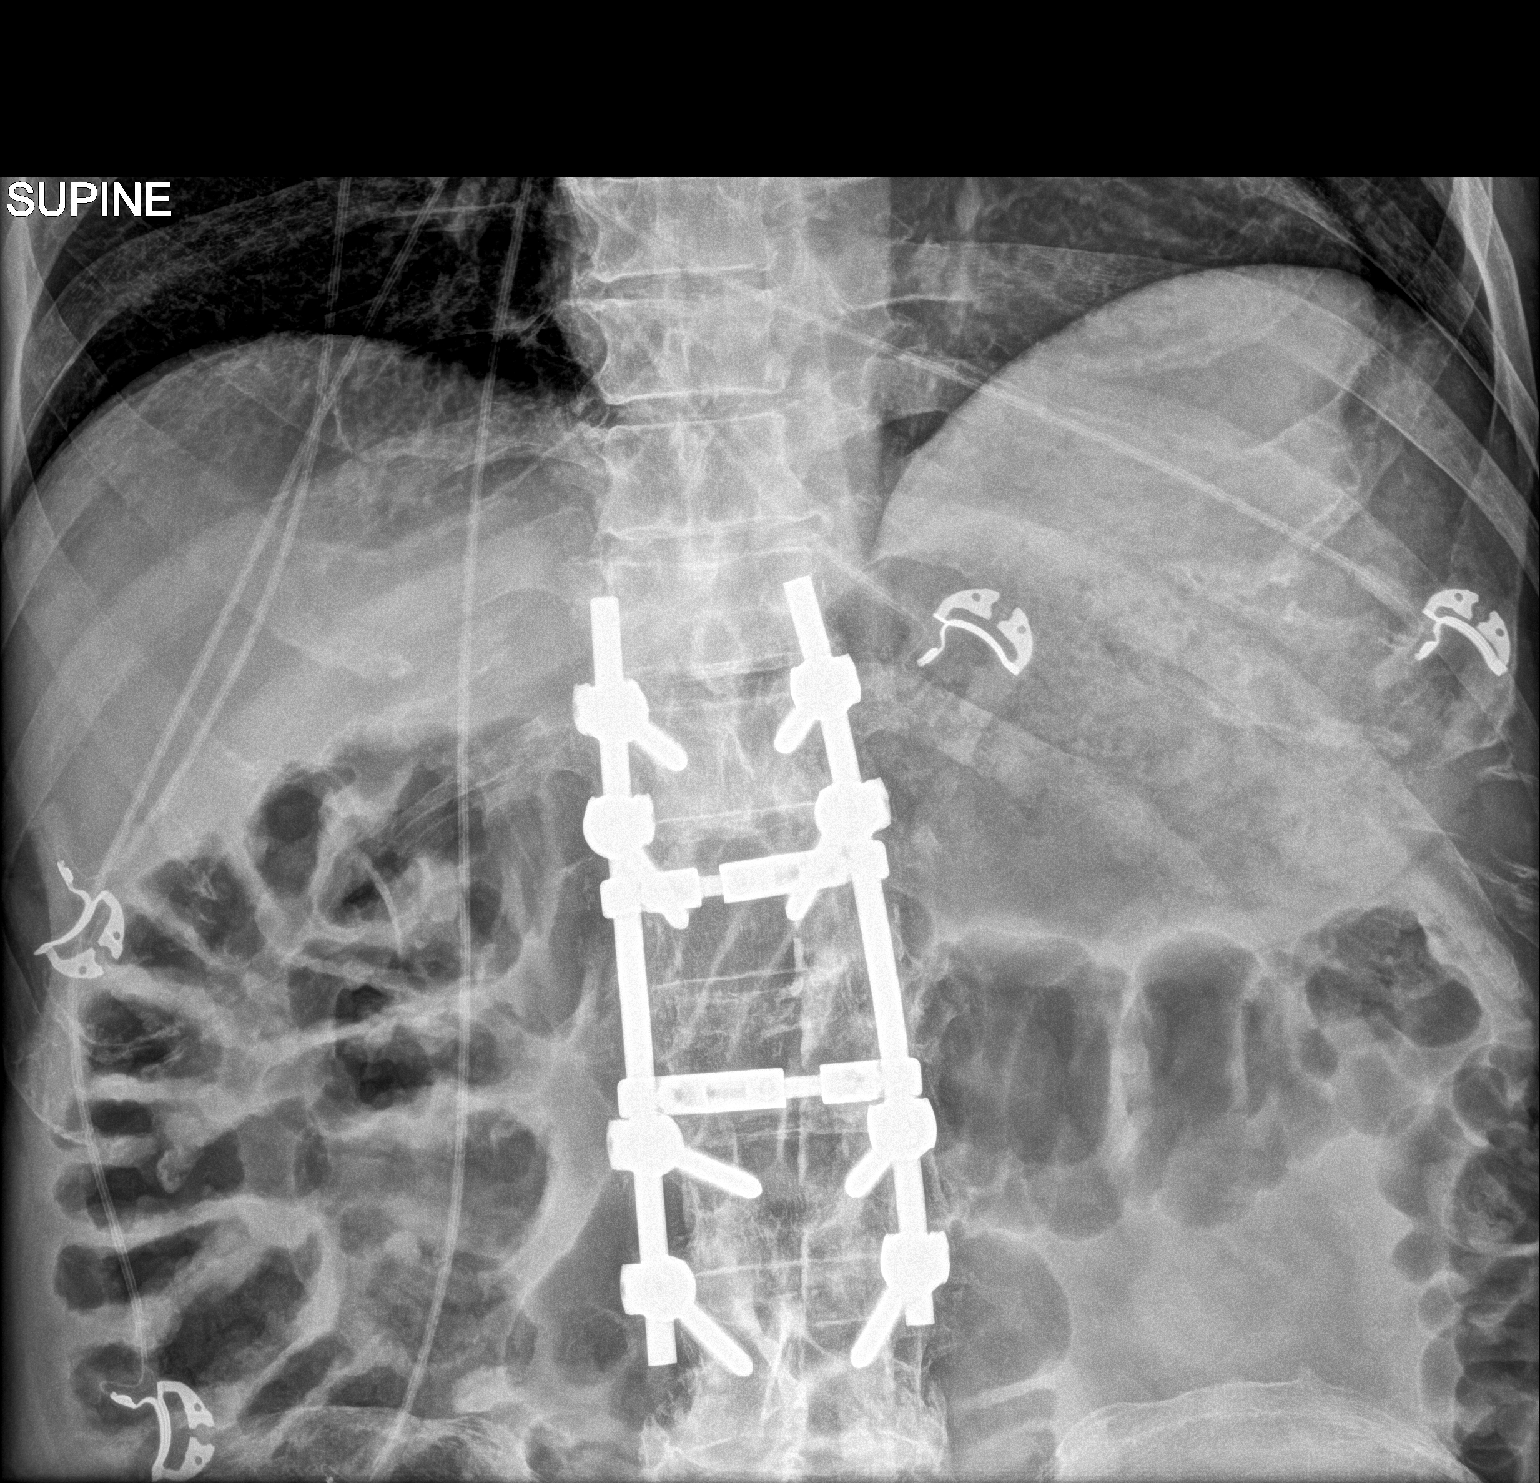

[2 of 2 positions shown; findings below may reference images not displayed]

FINDINGS: The bowel gas pattern is normal. No radio-opaque calculi or other
significant radiographic abnormality are seen. Thoracolumbar spine
fusion hardware noted.
IMPRESSION: Negative.

## 2021-04-19 MED ORDER — POTASSIUM CHLORIDE CRYS ER 20 MEQ PO TBCR
40.0000 meq | EXTENDED_RELEASE_TABLET | Freq: Two times a day (BID) | ORAL | Status: AC
  Administered 2021-04-19 (×2): 40 meq via ORAL
  Filled 2021-04-19 (×2): qty 2

## 2021-04-19 MED ORDER — INSULIN ASPART PROT & ASPART (70-30 MIX) 100 UNIT/ML ~~LOC~~ SUSP
12.0000 [IU] | Freq: Two times a day (BID) | SUBCUTANEOUS | Status: DC
  Administered 2021-04-20 – 2021-04-24 (×10): 12 [IU] via SUBCUTANEOUS
  Filled 2021-04-19: qty 10

## 2021-04-19 MED ORDER — ROSUVASTATIN CALCIUM 20 MG PO TABS
20.0000 mg | ORAL_TABLET | Freq: Every evening | ORAL | Status: DC
  Administered 2021-04-19: 18:00:00 20 mg via ORAL
  Filled 2021-04-19: qty 1

## 2021-04-19 MED ORDER — ONDANSETRON HCL 4 MG/2ML IJ SOLN
4.0000 mg | Freq: Four times a day (QID) | INTRAMUSCULAR | Status: DC | PRN
  Administered 2021-05-07 – 2021-05-09 (×3): 4 mg via INTRAVENOUS
  Filled 2021-04-19 (×5): qty 2

## 2021-04-19 NOTE — Progress Notes (Signed)
Physical Therapy Treatment Patient Details Name: Victor Fox MRN: [id number] DOB: Jan 07, 1946 Today's Date: 04/19/2021    History of Present Illness 75 y.o. male with a past medical history of significant diabetes mellitus type 2, hypertension, hyperlipidemia, neuropathy, vertigo, carotid artery disease, coronary artery disease, history of CVA, former smoker, resented to the hospital with c/o unsteainess/falls starting 8/13, MRI shows ischemic infarct.    PT Comments    Pt seen for PT tx with pt able to ambulate multiple laps in room equating to ~180 ft with RW & min assist<>CGA. Pt demonstrates decreased weight shifting to R & decreased attention to L during session (but reports visual deficits are baseline). Progressed to gait without AD & min assist for short distances for high level balance training. Continue to recommend Pt would benefit from STR upon d/c to maximize independence with functional mobility & reduce fall risk prior to return home; pt is currently unsafe to d/c home alone at this time.    Follow Up Recommendations  SNF;Supervision for mobility/OOB     Equipment Recommendations  None recommended by PT (TBD in next venue)    Recommendations for Other Services       Precautions / Restrictions Precautions Precautions: Fall Restrictions Weight Bearing Restrictions: No    Mobility  Bed Mobility   Bed Mobility: Supine to Sit     Supine to sit: Supervision;HOB elevated     General bed mobility comments: use of bed rails    Transfers Overall transfer level: Needs assistance Equipment used: Rolling walker (2 wheeled) Transfers: Sit to/from Stand Sit to Stand: Min guard;From elevated surface         General transfer comment: cuing for hand placement to push to standing  Ambulation/Gait Ambulation/Gait assistance: Min assist;Min guard Gait Distance (Feet): 180 Feet (multiple laps in room equating to 180 ft) Assistive device: Rolling walker (2  wheeled) Gait Pattern/deviations: Decreased stride length;Trunk flexed     General Gait Details: Frequent cuing to ambulate within center of AD but pt returns to pushing it too far out in front, extra time to complete wide turns, extra time to react but then able to avoid obstacles on L side. 180 ft with RW & CGA<>min assist, then 25 ft without AD & min assist.   Stairs             Wheelchair Mobility    Modified Rankin (Stroke Patients Only)       Balance Overall balance assessment: Needs assistance Sitting-balance support: No upper extremity supported;Feet supported Sitting balance-Leahy Scale: Fair Sitting balance - Comments: supervision sitting EOB   Standing balance support: During functional activity;No upper extremity supported Standing balance-Leahy Scale: Poor Standing balance comment: min assist gait without AD                            Cognition Arousal/Alertness: Awake/alert Behavior During Therapy: WFL for tasks assessed/performed Overall Cognitive Status: Impaired/Different from baseline                                 General Comments: Pt appears slightly HOH which somewhat impairs communication.      Exercises      General Comments General comments (skin integrity, edema, etc.): Pt appears to have some L inattention (reports visual deficits are baseline) as pt unable to locate call bell & remote on bed lying to his L side at  end of session with pt placing them on tray in front of him.      Pertinent Vitals/Pain Pain Assessment: No/denies pain    Home Living                      Prior Function            PT Goals (current goals can now be found in the care plan section) Acute Rehab PT Goals Patient Stated Goal: get stronger PT Goal Formulation: With patient Time For Goal Achievement: 05/02/21 Potential to Achieve Goals: Good Progress towards PT goals: Progressing toward goals    Frequency    Min  2X/week      PT Plan Current plan remains appropriate    Co-evaluation              AM-PAC PT "6 Clicks" Mobility   Outcome Measure  Help needed turning from your back to your side while in a flat bed without using bedrails?: A Little Help needed moving from lying on your back to sitting on the side of a flat bed without using bedrails?: A Little Help needed moving to and from a bed to a chair (including a wheelchair)?: A Little Help needed standing up from a chair using your arms (e.g., wheelchair or bedside chair)?: A Little Help needed to walk in hospital room?: A Little Help needed climbing 3-5 steps with a railing? : A Lot 6 Click Score: 17    End of Session Equipment Utilized During Treatment: Gait belt Activity Tolerance: Patient tolerated treatment well Patient left: in chair;with chair alarm set;with call bell/phone within reach Nurse Communication: Mobility status PT Visit Diagnosis: Muscle weakness (generalized) (M62.81);Difficulty in walking, not elsewhere classified (R26.2);Unsteadiness on feet (R26.81)     Time: 1032-1100 PT Time Calculation (min) (ACUTE ONLY): 28 min  Charges:  $Therapeutic Activity: 23-37 mins                     Lavone Nian, PT, DPT 04/19/21, 4:24 PM    Waunita Schooner 04/19/2021, 4:23 PM

## 2021-04-19 NOTE — Progress Notes (Signed)
*  PRELIMINARY RESULTS* Echocardiogram 2D Echocardiogram has been performed.  Jonette Mate Candus Braud 04/19/2021, 7:28 PM

## 2021-04-19 NOTE — Progress Notes (Signed)
SLP Cancellation Note  Patient Details Name: Victor Fox MRN: [id number] DOB: [birth year]   Cancelled treatment:        Chart reviewed, nursing consulted, pt tolerating current diet, appropriate cognitive linguistic abilities noted.   ST not indicated at this time.   [name] B. Rutherford Nail M.S., CCC-SLP, [place] Office [phone]    [name] 04/19/2021, 1:53 PM

## 2021-04-19 NOTE — Progress Notes (Signed)
Assessed patient ability to draw up and administer insulin at bedside. He couldn't make out the numbers on the insulin syringe. Pointed out the numbers to patient and he stated he couldn't make them out. Patient inserted needle into vial and pulled out 25units of insulin. He stated that he wasn't able to do this on his own and needed assistance. Reinserted 10 units of insulin back into vial and administered 15 units of insulin. Resting in chair with call bell in reach.
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
## 2021-04-19 NOTE — Plan of Care (Signed)
End of Shift Summary:  Alert and oriented x3, disoriented to situation. VSS, allowing for permissive HTN, pt remained on room air. Ambulated to bathroom with 1 assist. Orthostatic VS completed, see progress note. Urine output adequate via urinal. Denies pain or n/v. Remained free from falls or injury. Pt is impulsive and gets OOB without assistance, frequent reinforcing required. Bed low and in locked position, fall mat in place.   Problem: Education: Goal: Knowledge of risk factors and measures for prevention of condition will improve Outcome: Progressing   Problem: Respiratory: Goal: Will maintain a patent airway Outcome: Progressing   Problem: Coping: Goal: Will identify appropriate support needs Outcome: Progressing   Problem: Health Behavior/Discharge Planning: Goal: Ability to manage health-related needs will improve Outcome: Progressing   Problem: Self-Care: Goal: Ability to participate in self-care as condition permits will improve Outcome: Progressing Goal: Ability to communicate needs accurately will improve Outcome: Progressing   Problem: Ischemic Stroke/TIA Tissue Perfusion: Goal: Complications of ischemic stroke/TIA will be minimized Outcome: Progressing

## 2021-04-19 NOTE — Progress Notes (Signed)
Orthostatic VS    04/19/21 0614 04/19/21 0615 04/19/21 0623  Vitals  BP 131/74 133/72 135/82  MAP (mmHg) 90 91 98  BP Location Right Arm Right Arm Right Arm  BP Method Automatic Automatic Automatic  Patient Position (if appropriate) Lying Sitting Standing  Pulse Rate 61 63 80
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
## 2021-04-19 NOTE — Progress Notes (Signed)
PROGRESS NOTE                                                                                                                                                                                                             Patient Demographics:    Victor Fox, is a 75 y.o. male, DOB - [birth year], OZ:[id number]  Outpatient Primary MD for the patient is Patient, No Pcp Per (Inactive)    LOS - 1  Admit date - 04/17/2021    Chief Complaint  Patient presents with   Dizziness       Brief Narrative (HPI from H&P)   Victor Fox is a 75 y.o. male with a past medical history of significant diabetes mellitus type 2, hypertension, hyperlipidemia, neuropathy, vertigo, carotid artery disease, coronary artery disease, history of CVA, former smoker, resented to the hospital with chief complaints of dizziness upon sitting up and standing, in the ER initial work-up negative follow-up MRI shows ischemic infarcts along with loud systolic murmur.  He was admitted for further care.   Subjective:   She had in bed denies any headache chest or abdominal pain, still gets dizzy upon sitting up, some subjective weakness on the right side but very mild.   Assessment  & Plan :     Acute on chronic dizziness mostly postural  with positive orthostatics - he does have acute CVA however he is nearly orthostatic with a very loud aortic systolic murmur.  Some chronic dizziness as well and this could be multifactorial, for now we will stabilize his blood pressure, check echocardiogram to evaluate the heart murmur, will also request cardiology to evaluate, stroke work-up as below.  2.  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  Could be small vessel but question if this could be embolic, monitor on telemetry, EKG nonacute with artifact per cardiology, no previous history of dysrhythmia, neurology and cardiology following, currently on aspirin  and Plavix for 3 weeks thereafter aspirin only, statin dose adjusted for better LDL control, A1c was in poor control and diabetic education will be provided, echocardiogram is pending, CT head noted which is nonacute, upon discharge likely will require EP follow-up for ZIO patch.  PT OT and speech following him, he lives alone and may require placement.    3.  Incidental COVID infection.  Inflammatory markers stable.  Could have contributed to CVA, for now aspirin Plavix continue and monitor clinically.  No pulmonary symptoms   4.  Hypertension with orthostatic hypotension.  For now hold ACE inhibitor allow for permissive hypertension due to acute stroke.  5. ?  A flutter versus artifact on initial EKG in the ER, subsequent EKG within half an hour was sinus rhythm.  Per cardiology artifact, TSH stable, see #2 above.  6.  Loud systolic murmur.  Check echocardiogram.  Question if he has advanced aortic stenosis.    7. Dyslipidemia.  On statin a.m. lipid panel ordered.  8. Underlying history of CAD.  Chest pain-free.  Currently on aspirin Plavix and statin.  Continue.    9. DM type II.  On Lantus and sliding scale.  A1c suggests poor outpatient control due to hyperglycemia.  Diabetic education  Lab Results  Component Value Date   HGBA1C 9.5 (H) 04/18/2021   CBG (last 3)  Recent Labs    04/18/21 1301 04/18/21 2035 04/19/21 0847  GLUCAP 230* 188* 185*          Condition -  Guarded  Family Communication  :  None present  Code Status :  Full  Consults  :  Cards, Neuro  PUD Prophylaxis :    Procedures  :     MRI -  1. Small acute infarcts of the left medullary pyramid and superior right frontal lobe. No acute hemorrhage or mass effect. 2. Numerous chronic microhemorrhages, predominantly central. This likely indicates chronic hypertensive angiopathy. 3. Advanced atrophy and chronic small vessel ischemic changes.  Carotid US - Extensive bilateral atherosclerotic plaque similar to  that seen on prior exams without focal hemodynamically significant stenosis.  CTA - No acute intracranial abnormality. Small infarcts on brain MRI are not seen. No large vessel occlusion. Plaque along the proximal internal carotids causes less than 50% stenosis. Mild intracranial atherosclerosis.  TTE      Disposition Plan  :    Status is: Inpatient  Remains inpatient appropriate because:IV treatments appropriate due to intensity of illness or inability to take PO  Dispo: The patient is from: Home              Anticipated d/c is to: Home              Patient currently is not medically stable to d/c.   Difficult to place patient No  DVT Prophylaxis  :    enoxaparin (LOVENOX) injection 40 mg Start: 04/17/21 2200     Lab Results  Component Value Date   PLT 208 04/19/2021    Diet :  Diet Order             Diet Carb Modified Fluid consistency: Thin; Room service appropriate? Yes  Diet effective now                    Inpatient Medications  Scheduled Meds:  aspirin EC  81 mg Oral Daily   clopidogrel  75 mg Oral Daily   enoxaparin (LOVENOX) injection  40 mg Subcutaneous Q24H   insulin aspart  0-15 Units Subcutaneous TID WC   insulin aspart  0-5 Units Subcutaneous QHS   insulin aspart protamine- aspart  15 Units Subcutaneous BID WC   potassium chloride  40 mEq Oral BID   rosuvastatin  20 mg Oral QPM   Continuous Infusions: PRN Meds:.albuterol, meclizine  Antibiotics  :    Anti-infectives (From admission, onward)    Start  Dose/Rate Route Frequency Ordered Stop   04/17/21 2230  nirmatrelvir/ritonavir EUA (PAXLOVID) 3 tablet  Status:  Discontinued        3 tablet Oral 2 times daily 04/17/21 2218 04/17/21 2225        Time Spent in minutes  30   Lala Lund M.D on 04/19/2021 at 9:40 AM  To page go to [email or web address]   [facility] -  Office  [phone]    See all Orders from today for further details    Objective:   Vitals:    04/19/21 0614 04/19/21 0615 04/19/21 0623 04/19/21 0852  BP: 131/74 133/72 135/82 (!) 143/71  Pulse: 61 63 80 61  Resp:    18  Temp: 98 F (36.7 C)   98.4 F (36.9 C)  TempSrc:    Oral  SpO2: 98% 98%  99%  Weight:      Height:        Wt Readings from Last 3 Encounters:  04/17/21 90.7 kg  10/18/19 90.7 kg  11/23/18 99.2 kg     Intake/Output Summary (Last 24 hours) at 04/19/2021 0940 Last data filed at 04/19/2021 0600 Gross per 24 hour  Intake 474 ml  Output 375 ml  Net 99 ml     Physical Exam  Awake Alert, No new F.N deficits, however subjectively he feels that his right side is minimally weak as compared to the left [place].AT,PERRAL Supple Neck,No JVD, No cervical lymphadenopathy appriciated.  Symmetrical Chest wall movement, Good air movement bilaterally, CTAB RRR,No Gallops, loud aortic systolic murmur, +ve B.Sounds, Abd Soft, No tenderness, No organomegaly appriciated, No rebound - guarding or rigidity. No Cyanosis, Clubbing or edema, No new Rash or bruise    Data Review:    CBC Recent Labs  Lab 04/17/21 1645 04/18/21 0730 04/19/21 0326  WBC 7.2 7.8 8.3  HGB 14.2 14.8 14.9  HCT 39.2 40.9 42.4  PLT 200 191 208  MCV 82.4 82.5 82.3  MCH 29.8 29.8 28.9  MCHC 36.2* 36.2* 35.1  RDW 13.1 13.2 13.3  LYMPHSABS  --   --  2.7  MONOABS  --   --  0.5  EOSABS  --   --  0.2  BASOSABS  --   --  0.0    Recent Labs  Lab 04/17/21 1645 04/18/21 0730 04/19/21 0326  NA 136 138 140  K 3.3* 3.2* 3.1*  CL 102 101 105  CO2 '27 28 28  '$ GLUCOSE 299* 219* 171*  BUN '10 9 9  '$ CREATININE 0.80 0.68 0.87  CALCIUM 8.9 8.9 8.8*  AST  --  16 15  ALT  --  13 11  ALKPHOS  --  75 75  BILITOT  --  1.0 1.0  ALBUMIN  --  3.7 3.4*  MG  --  2.2 2.2  CRP  --  0.5  --   DDIMER  --  0.50 0.57*  TSH  --  1.137  --   HGBA1C  --  9.5*  --   BNP  --  116.6* 60.1    ------------------------------------------------------------------------------------------------------------------ Recent  Labs    04/19/21 0326  CHOL 162  HDL 36*  LDLCALC 103*  TRIG 116  CHOLHDL 4.5    Lab Results  Component Value Date   HGBA1C 9.5 (H) 04/18/2021   ------------------------------------------------------------------------------------------------------------------ Recent Labs    04/18/21 0730  TSH 1.137    Cardiac Enzymes No results for input(s): CKMB, TROPONINI, MYOGLOBIN in the last 168 hours.  Invalid input(s): CK ------------------------------------------------------------------------------------------------------------------  Component Value Date/Time   BNP 60.1 04/19/2021 0326      Radiology Reports CT ANGIO HEAD NECK W WO CM  Result Date: 04/18/2021 CLINICAL DATA:  Small acute strokes on MRI brain EXAM: CT ANGIOGRAPHY HEAD AND NECK TECHNIQUE: Multidetector CT imaging of the head and neck was performed using the standard protocol during bolus administration of intravenous contrast. Multiplanar CT image reconstructions and MIPs were obtained to evaluate the vascular anatomy. Carotid stenosis measurements (when applicable) are obtained utilizing NASCET criteria, using the distal internal carotid diameter as the denominator. CONTRAST:  61m OMNIPAQUE IOHEXOL 350 MG/ML SOLN COMPARISON:  Recent CT and MR imaging FINDINGS: CT HEAD Brain: Small infarcts on the MRI brain are not well seen. There is no acute intracranial hemorrhage. No new loss of gray-white differentiation. Stable findings of chronic microvascular ischemic changes and parenchymal volume loss. Few chronic small vessel infarcts including involvement of the right cerebellum and left thalamus. Ventricles and sulci are stable in size and configuration. Vascular: Better evaluated on CTA portion. Skull: Calvarium is unremarkable. Sinuses/Orbits: No acute finding. Other: None. Review of the MIP images confirms the above findings CTA NECK Aortic arch: Calcified plaque along the thoracic aorta. Great vessel origins are patent.  Right carotid system: Patent. Calcified plaque along the distal common carotid with minimal stenosis. Calcified plaque at the bifurcation and along the proximal internal carotid. There is less than 50% stenosis. Left carotid system: Patent. Mixed plaque along the common carotid with minimal stenosis. Calcified plaque along the proximal internal carotid with less than 50% stenosis. Vertebral arteries: Patent. Right vertebral artery is slightly dominant. There is focal moderate to marked stenosis of the left vertebral artery at the C4-C5 level. Skeleton: Cervical spine degenerative changes. Prominent osteophyte extends into the right ventral and lateral spinal canal at C5-C6. Other neck: None. Upper chest: Branching opacity at the left lung apex likely reflects impacted airways. Review of the MIP images confirms the above findings CTA HEAD Anterior circulation: Intracranial internal carotid arteries are patent with calcified plaque causing mild stenosis. Anterior and middle cerebral arteries are patent. Plaque causes mild stenosis of the left M1 MCA. There is mild to moderate stenosis of the distal left A2 ACA. Posterior circulation: Intracranial vertebral arteries are patent with calcified plaque bilaterally causing mild stenosis. Basilar artery is patent. Major cerebellar artery origins are patent. Posterior cerebral arteries are patent. Venous sinuses: Patent as allowed by contrast bolus timing. Review of the MIP images confirms the above findings IMPRESSION: No acute intracranial abnormality. Small infarcts on brain MRI are not seen. No large vessel occlusion. Plaque along the proximal internal carotids causes less than 50% stenosis. Mild intracranial atherosclerosis. Electronically Signed   By: PMacy MisM.D.   On: 04/18/2021 14:42   CT HEAD WO CONTRAST (5MM)  Result Date: 04/17/2021 CLINICAL DATA:  Neuro deficit, acute, stroke suspected. Dizziness. Gait disturbance. EXAM: CT HEAD WITHOUT CONTRAST  TECHNIQUE: Contiguous axial images were obtained from the base of the skull through the vertex without intravenous contrast. COMPARISON:  01/08/2020 FINDINGS: Brain: Advanced generalized brain volume loss. Advanced chronic small-vessel ischemic changes affecting the pons, thalami and cerebral hemispheric white matter. No discernible change since last year. No large vessel territory stroke. No mass, hemorrhage, hydrocephalus or extra-axial collection. Vascular: There is atherosclerotic calcification of the major vessels at the base of the brain. Skull: Negative Sinuses/Orbits: Clear/normal Other: None IMPRESSION: No acute finding or change since last year. Generalized brain volume loss with extensive chronic small-vessel ischemic changes throughout  as described. Electronically Signed   By: Nelson Chimes M.D.   On: 04/17/2021 20:10   MR BRAIN WO CONTRAST  Result Date: 04/17/2021 CLINICAL DATA:  Dizziness EXAM: MRI HEAD WITHOUT CONTRAST TECHNIQUE: Multiplanar, multiecho pulse sequences of the brain and surrounding structures were obtained without intravenous contrast. COMPARISON:  10/18/2019 FINDINGS: Brain: Small focus of abnormal diffusion restriction within the left medullary pyramid. There is also a small focus of ischemia in the superior right frontal lobe. Numerous chronic microhemorrhages, predominantly central. Hyperintense T2-weighted signal is moderately widespread throughout the white matter. Advanced atrophy for age. The midline structures are normal. Vascular: Major flow voids are preserved. Skull and upper cervical spine: Normal calvarium and skull base. Visualized upper cervical spine and soft tissues are normal. Sinuses/Orbits:No paranasal sinus fluid levels or advanced mucosal thickening. No mastoid or middle ear effusion. Normal orbits. IMPRESSION: 1. Small acute infarcts of the left medullary pyramid and superior right frontal lobe. No acute hemorrhage or mass effect. 2. Numerous chronic  microhemorrhages, predominantly central. This likely indicates chronic hypertensive angiopathy. 3. Advanced atrophy and chronic small vessel ischemic changes. Electronically Signed   By: Ulyses Jarred M.D.   On: 04/17/2021 22:03   US Carotid Bilateral  Result Date: 04/18/2021 CLINICAL DATA:  Near syncopal event EXAM: BILATERAL CAROTID DUPLEX ULTRASOUND TECHNIQUE: Pearline Cables scale imaging, color Doppler and duplex ultrasound were performed of bilateral carotid and vertebral arteries in the neck. COMPARISON:  10/19/2019 FINDINGS: Criteria: Quantification of carotid stenosis is based on velocity parameters that correlate the residual internal carotid diameter with NASCET-based stenosis levels, using the diameter of the distal internal carotid lumen as the denominator for stenosis measurement. The following velocity measurements were obtained: RIGHT ICA: 78/10 cm/sec CCA:  [id number] cm/sec SYSTOLIC ICA/CCA RATIO:  1.7 ECA:  63 cm/sec LEFT ICA:  74/9 cm/sec CCA:  [id number] cm/sec SYSTOLIC ICA/CCA RATIO:  1.4 ECA:  66 cm/sec RIGHT CAROTID ARTERY: Preliminary grayscale images demonstrate scattered atherosclerotic plaque in the common and internal carotid arteries. The waveforms, velocities and flow velocity ratios however demonstrate no evidence of focal hemodynamically significant stenosis. RIGHT VERTEBRAL ARTERY:  Antegrade in nature. LEFT CAROTID ARTERY: Preliminary grayscale images of the left carotid system demonstrates scattered atherosclerotic plaque in the common and internal carotid artery. The waveforms, velocities and flow velocity ratios show no evidence of focal hemodynamically significant stenosis. LEFT VERTEBRAL ARTERY:  Antegrade in nature. IMPRESSION: Extensive bilateral atherosclerotic plaque similar to that seen on prior exams without focal hemodynamically significant stenosis. Electronically Signed   By: Inez Catalina M.D.   On: 04/18/2021 01:20   DG Chest Portable 1 View  Result Date: 04/17/2021 CLINICAL DATA:   COVID-19 positivity with dizziness, initial encounter EXAM: PORTABLE CHEST 1 VIEW COMPARISON:  10/08/2018 FINDINGS: Cardiac shadow is within normal limits. Aortic calcifications are seen. Lungs are clear bilaterally. Postsurgical changes in the thoracolumbar spine are noted. IMPRESSION: No active disease. Electronically Signed   By: Inez Catalina M.D.   On: 04/17/2021 21:41

## 2021-04-19 NOTE — Consult Note (Signed)
[facility] Consult   Reason for Consult:  Self-care deficits, capacity Referring Physician:  Dr Candiss Norse Patient Identification: Victor Fox MRN:  [id number] Principal Diagnosis: <principal problem not specified> Diagnosis:  Active Problems:   Self-care deficit   Near syncope   Total Time spent with patient: 1 hour  Subjective:   Victor Fox is a 75 y.o. male patient admitted after a fall with complaints of being dizzy.  HPI:  75 yo male admitted after a fall related to dizziness.  Consult placed for capacity and self-deficit concerns.  He lives alone in a trailer on [place] acres.  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  However, he cannot cook for himself or prepare his food.  He states  he would be "ok, if I could walk" but he keeps falling.  When asked what he would do if he fell and could not get up or had a medical emergency and he reports he would call someone, even after much prompting.  When asked about a 3 digit number, no clue.  When this provider stated "9", he said "[phone]".  Alert and oriented times three, pleasant and cooperative, jovial at times.  Reports he has two children who live in [place] and do not come to see him.  "I have nobody to help me."  When asked about an assisted living facility, he stated, "They told me I had to go there."  He is agreeable to go, "I'll do whatever they need me to do."  Client is not able to safely care for himself and has insight into this, willing to transfer to an assisted living type of facility.  Engaged easily in the assessment and invited this provider back anytime they wanted to talk.    Past Psychiatric History: denies  Risk to Self:  self-care deficits Risk to Others:  none Prior Inpatient Therapy:  none Prior Outpatient Therapy:  none  Past Medical History:  Past Medical History:  Diagnosis Date   Arthritis    Benign neoplasm of unspecified site    Carotid artery disease ([place])    a.  carotid duplex 05/2015: less tahn 39% stenosis bilaterally, stable over the past year, recommend repeat imaging in 2 years (05/2017)   Compression fracture    a. L2   CVA (cerebral vascular accident) ([place]) 06/2012   Diabetes mellitus without complication ([place])    Heart murmur    a. echo 06/2012: EF >55%, LVH, aortic valve sclerosis   Hyperlipidemia    Hypertension    Neuropathy    Pneumonia    Tobacco abuse    a. smoked x 25 years   Vitamin D deficiency     Past Surgical History:  Procedure Laterality Date   ARTHRODESIS     BACK SURGERY     x 2   discectomy     trigger release     Family History:  Family History  Problem Relation Age of Onset   Heart disease Brother    Hypertension Mother    Family Psychiatric  History: see above Social History:  Social History   Substance and Sexual Activity  Alcohol Use No     Social History   Substance and Sexual Activity  Drug Use No    Social History   Socioeconomic History   Marital status: Single    Spouse name: Not on file   Number of children: 2   Years of education: Not on file  Highest education level: 12th grade  Occupational History    Comment: full time  Tobacco Use   Smoking status: Former    Packs/day: 1.00    Years: 10.00    Pack years: 10.00    Types: Cigarettes   Smokeless tobacco: Never  Vaping Use   Vaping Use: Never used  Substance and Sexual Activity   Alcohol use: No   Drug use: No   Sexual activity: Not Currently  Other Topics Concern   Not on file  Social History Narrative   Not on file   Social Determinants of Health   Financial Resource Strain: Not on file  Food Insecurity: Not on file  Transportation Needs: Not on file  Physical Activity: Not on file  Stress: Not on file  Social Connections: Not on file   Additional Social History:    Allergies:  No Known Allergies  Labs:  Results for orders placed or performed during the hospital encounter of 04/17/21 (from the past 48  hour(s))  Urinalysis, Complete w Microscopic     Status: Abnormal   Collection Time: 04/17/21  7:30 PM  Result Value Ref Range   Color, Urine YELLOW (A) YELLOW   APPearance CLEAR (A) CLEAR   Specific Gravity, Urine 1.010 1.005 - 1.030   pH 5.0 5.0 - 8.0   Glucose, UA >=500 (A) NEGATIVE mg/dL   Hgb urine dipstick NEGATIVE NEGATIVE   Bilirubin Urine NEGATIVE NEGATIVE   Ketones, ur NEGATIVE NEGATIVE mg/dL   Protein, ur NEGATIVE NEGATIVE mg/dL   Nitrite NEGATIVE NEGATIVE   Leukocytes,Ua NEGATIVE NEGATIVE   WBC, UA NONE SEEN 0 - 5 WBC/hpf   Bacteria, UA NONE SEEN NONE SEEN   Squamous Epithelial / LPF NONE SEEN 0 - 5   Mucus PRESENT     Comment: Performed at [facility], [address] [place], [place] [address]  Resp Panel by RT-PCR (Flu A&B, Covid) Nasopharyngeal Swab     Status: Abnormal   Collection Time: 04/17/21  7:30 PM   Specimen: Nasopharyngeal Swab; Nasopharyngeal(NP) swabs in vial transport medium  Result Value Ref Range   SARS Coronavirus 2 by RT PCR POSITIVE (A) NEGATIVE    Comment: READ BACK AND VERIFIED BY Victor LOCKEE, RN AT 2039 04/17/21 BY [facility] (NOTE) SARS-CoV-2 target nucleic acids are DETECTED.  The SARS-CoV-2 RNA is generally detectable in upper respiratory specimens during the acute phase of infection. Positive results are indicative of the presence of the identified virus, but do not rule out bacterial infection or co-infection with other pathogens not detected by the test. Clinical correlation with patient history and other diagnostic information is necessary to determine patient infection status. The expected result is Negative.  Fact Sheet for Patients: EntrepreneurPulse.com.au  Fact Sheet for Healthcare Providers: IncredibleEmployment.be  This test is not yet approved or cleared by the [place] FDA and  has been authorized for detection and/or diagnosis of SARS-CoV-2 by FDA under an Emergency Use  Authorization (EUA).  This EUA will remain in effect (meaning this test can be used) for the dur ation of  the COVID-19 declaration under Section 564(b)(1) of the Act, 21 U.S.C. section 360bbb-3(b)(1), unless the authorization is terminated or revoked sooner.     Influenza A by PCR NEGATIVE NEGATIVE   Influenza B by PCR NEGATIVE NEGATIVE    Comment: (NOTE) The Xpert Xpress SARS-CoV-2/FLU/RSV plus assay is intended as an aid in the diagnosis of influenza from Nasopharyngeal swab specimens and should not be used as a sole  basis for treatment. Nasal washings and aspirates are unacceptable for Xpert Xpress SARS-CoV-2/FLU/RSV testing.  Fact Sheet for Patients: EntrepreneurPulse.com.au  Fact Sheet for Healthcare Providers: IncredibleEmployment.be  This test is not yet approved or cleared by the [place] FDA and has been authorized for detection and/or diagnosis of SARS-CoV-2 by FDA under an Emergency Use Authorization (EUA). This EUA will remain in effect (meaning this test can be used) for the duration of the COVID-19 declaration under Section 564(b)(1) of the Act, 21 U.S.C. section 360bbb-3(b)(1), unless the authorization is terminated or revoked.  Performed at [facility], [address] [place], [place] [address]   CBG monitoring, ED     Status: Abnormal   Collection Time: 04/17/21  9:54 PM  Result Value Ref Range   Glucose-Capillary 223 (H) 70 - 99 mg/dL    Comment: Glucose reference range applies only to samples taken after fasting for at least 8 hours.  CBG monitoring, ED     Status: Abnormal   Collection Time: 04/18/21  7:29 AM  Result Value Ref Range   Glucose-Capillary 205 (H) 70 - 99 mg/dL    Comment: Glucose reference range applies only to samples taken after fasting for at least 8 hours.  Hemoglobin A1c     Status: Abnormal   Collection Time: 04/18/21  7:30 AM  Result Value Ref Range   Hgb A1c MFr Bld 9.5 (H) 4.8  - 5.6 %    Comment: (NOTE) Pre diabetes:          5.7%-6.4%  Diabetes:              >6.4%  Glycemic control for   <7.0% adults with diabetes    Mean Plasma Glucose 225.95 mg/dL    Comment: Performed at [facility] [address], [place], [place] [address]  C-reactive protein     Status: None   Collection Time: 04/18/21  7:30 AM  Result Value Ref Range   CRP 0.5 <1.0 mg/dL    Comment: Performed at [facility], [address], [place], [place] [address]  CBC     Status: Abnormal   Collection Time: 04/18/21  7:30 AM  Result Value Ref Range   WBC 7.8 4.0 - 10.5 K/uL   RBC 4.96 4.22 - 5.81 MIL/uL   Hemoglobin 14.8 13.0 - 17.0 g/dL   HCT 40.9 39.0 - 52.0 %   MCV 82.5 80.0 - 100.0 fL   MCH 29.8 26.0 - 34.0 pg   MCHC 36.2 (H) 30.0 - 36.0 g/dL   RDW 13.2 11.5 - 15.5 %   Platelets 191 150 - 400 K/uL   nRBC 0.0 0.0 - 0.2 %    Comment: Performed at [facility], [address], [place], [place] [address]  Magnesium     Status: None   Collection Time: 04/18/21  7:30 AM  Result Value Ref Range   Magnesium 2.2 1.7 - 2.4 mg/dL    Comment: Performed at [facility], [address] [place], [place] [address]  D-dimer, quantitative     Status: None   Collection Time: 04/18/21  7:30 AM  Result Value Ref Range   D-Dimer, Quant 0.50 0.00 - 0.50 ug/mL-FEU    Comment: (NOTE) At the manufacturer cut-off value of 0.5 g/mL FEU, this assay has a negative predictive value of 95-100%.This assay is intended for use in conjunction with a clinical pretest probability (PTP) assessment model to exclude pulmonary embolism (PE) and deep venous thrombosis (  DVT) in outpatients suspected of PE or DVT. Results should be correlated with clinical presentation. Performed at [facility], [address] [place], [place] [address]   Brain natriuretic peptide     Status: Abnormal   Collection Time: 04/18/21  7:30 AM  Result Value Ref Range   B Natriuretic Peptide  116.6 (H) 0.0 - 100.0 pg/mL    Comment: Performed at [facility], [address] [place], [place] [address]  Comprehensive metabolic panel     Status: Abnormal   Collection Time: 04/18/21  7:30 AM  Result Value Ref Range   Sodium 138 135 - 145 mmol/L   Potassium 3.2 (L) 3.5 - 5.1 mmol/L   Chloride 101 98 - 111 mmol/L   CO2 28 22 - 32 mmol/L   Glucose, Bld 219 (H) 70 - 99 mg/dL    Comment: Glucose reference range applies only to samples taken after fasting for at least 8 hours.   BUN 9 8 - 23 mg/dL   Creatinine, Ser 0.68 0.61 - 1.24 mg/dL   Calcium 8.9 8.9 - 10.3 mg/dL   Total Protein 6.4 (L) 6.5 - 8.1 g/dL   Albumin 3.7 3.5 - 5.0 g/dL   AST 16 15 - 41 U/L   ALT 13 0 - 44 U/L   Alkaline Phosphatase 75 38 - 126 U/L   Total Bilirubin 1.0 0.3 - 1.2 mg/dL   GFR, Estimated >60 >60 mL/min    Comment: (NOTE) Calculated using the CKD-EPI Creatinine Equation (2021)    Anion gap 9 5 - 15    Comment: Performed at [facility], [address] [place], [place] [address]  TSH     Status: None   Collection Time: 04/18/21  7:30 AM  Result Value Ref Range   TSH 1.137 0.350 - 4.500 uIU/mL    Comment: Performed by a 3rd Generation assay with a functional sensitivity of <=0.01 uIU/mL. Performed at [facility], [address] [place], [place] [address]   CBG monitoring, ED     Status: Abnormal   Collection Time: 04/18/21 11:10 AM  Result Value Ref Range   Glucose-Capillary 107 (H) 70 - 99 mg/dL    Comment: Glucose reference range applies only to samples taken after fasting for at least 8 hours.  CBG monitoring, ED     Status: Abnormal   Collection Time: 04/18/21  1:01 PM  Result Value Ref Range   Glucose-Capillary 230 (H) 70 - 99 mg/dL    Comment: Glucose reference range applies only to samples taken after fasting for at least 8 hours.  Glucose, capillary     Status: Abnormal   Collection Time: 04/18/21  8:35 PM  Result Value Ref Range   Glucose-Capillary  188 (H) 70 - 99 mg/dL    Comment: Glucose reference range applies only to samples taken after fasting for at least 8 hours.  CBC with Differential/Platelet     Status: None   Collection Time: 04/19/21  3:26 AM  Result Value Ref Range   WBC 8.3 4.0 - 10.5 K/uL   RBC 5.15 4.22 - 5.81 MIL/uL   Hemoglobin 14.9 13.0 - 17.0 g/dL   HCT 42.4 39.0 - 52.0 %   MCV 82.3 80.0 - 100.0 fL   MCH 28.9 26.0 - 34.0 pg   MCHC 35.1 30.0 - 36.0 g/dL   RDW 13.3 11.5 - 15.5 %   Platelets 208 150 - 400 K/uL   nRBC 0.0 0.0 - 0.2 %  Neutrophils Relative % 58 %   Neutro Abs 4.9 1.7 - 7.7 K/uL   Lymphocytes Relative 32 %   Lymphs Abs 2.7 0.7 - 4.0 K/uL   Monocytes Relative 7 %   Monocytes Absolute 0.5 0.1 - 1.0 K/uL   Eosinophils Relative 2 %   Eosinophils Absolute 0.2 0.0 - 0.5 K/uL   Basophils Relative 0 %   Basophils Absolute 0.0 0.0 - 0.1 K/uL   Immature Granulocytes 1 %   Abs Immature Granulocytes 0.04 0.00 - 0.07 K/uL    Comment: Performed at [facility], [address] [place], [place] [address]  Brain natriuretic peptide     Status: None   Collection Time: 04/19/21  3:26 AM  Result Value Ref Range   B Natriuretic Peptide 60.1 0.0 - 100.0 pg/mL    Comment: Performed at [facility], [address] [place], [place] [address]  Magnesium     Status: None   Collection Time: 04/19/21  3:26 AM  Result Value Ref Range   Magnesium 2.2 1.7 - 2.4 mg/dL    Comment: Performed at [facility], [address] [place], [place] [address]  D-dimer, quantitative     Status: Abnormal   Collection Time: 04/19/21  3:26 AM  Result Value Ref Range   D-Dimer, Quant 0.57 (H) 0.00 - 0.50 ug/mL-FEU    Comment: (NOTE) At the manufacturer cut-off value of 0.5 g/mL FEU, this assay has a negative predictive value of 95-100%.This assay is intended for use in conjunction with a clinical pretest probability (PTP) assessment model to exclude pulmonary embolism (PE) and deep venous  thrombosis (DVT) in outpatients suspected of PE or DVT. Results should be correlated with clinical presentation. Performed at [facility], [address] [place], [place] [address]   C-reactive protein     Status: None   Collection Time: 04/19/21  3:26 AM  Result Value Ref Range   CRP 0.6 <1.0 mg/dL    Comment: Performed at [facility] [address] [place], [place] [address]  Comprehensive metabolic panel     Status: Abnormal   Collection Time: 04/19/21  3:26 AM  Result Value Ref Range   Sodium 140 135 - 145 mmol/L   Potassium 3.1 (L) 3.5 - 5.1 mmol/L   Chloride 105 98 - 111 mmol/L   CO2 28 22 - 32 mmol/L   Glucose, Bld 171 (H) 70 - 99 mg/dL    Comment: Glucose reference range applies only to samples taken after fasting for at least 8 hours.   BUN 9 8 - 23 mg/dL   Creatinine, Ser 0.87 0.61 - 1.24 mg/dL   Calcium 8.8 (L) 8.9 - 10.3 mg/dL   Total Protein 6.1 (L) 6.5 - 8.1 g/dL   Albumin 3.4 (L) 3.5 - 5.0 g/dL   AST 15 15 - 41 U/L   ALT 11 0 - 44 U/L   Alkaline Phosphatase 75 38 - 126 U/L   Total Bilirubin 1.0 0.3 - 1.2 mg/dL   GFR, Estimated >60 >60 mL/min    Comment: (NOTE) Calculated using the CKD-EPI Creatinine Equation (2021)    Anion gap 7 5 - 15    Comment: Performed at [facility], [address] [place], [place] [address]  Lipid panel     Status: Abnormal   Collection Time: 04/19/21  3:26 AM  Result Value Ref Range   Cholesterol 162 0 - 200 mg/dL   Triglycerides 116 <150 mg/dL   HDL  36 (L) >40 mg/dL   Total CHOL/HDL Ratio 4.5 RATIO   VLDL 23 0 - 40 mg/dL   LDL Cholesterol 103 (H) 0 - 99 mg/dL    Comment:        Total Cholesterol/HDL:CHD Risk Coronary Heart Disease Risk Table                     Men   Women  1/2 Average Risk   3.4   3.3  Average Risk       5.0   4.4  2 X Average Risk   9.6   7.1  3 X Average Risk  23.4   11.0        Use the calculated Patient Ratio above and the CHD Risk Table to determine the patient's  CHD Risk.        ATP III CLASSIFICATION (LDL):  <100     mg/dL   Optimal  100-129  mg/dL   Near or Above                    Optimal  130-159  mg/dL   Borderline  160-189  mg/dL   High  >190     mg/dL   Very High Performed at [facility], [address] [place], [place] [address]   Glucose, capillary     Status: Abnormal   Collection Time: 04/19/21  8:47 AM  Result Value Ref Range   Glucose-Capillary 185 (H) 70 - 99 mg/dL    Comment: Glucose reference range applies only to samples taken after fasting for at least 8 hours.   Comment 1 Notify RN   Glucose, capillary     Status: Abnormal   Collection Time: 04/19/21 11:58 AM  Result Value Ref Range   Glucose-Capillary 177 (H) 70 - 99 mg/dL    Comment: Glucose reference range applies only to samples taken after fasting for at least 8 hours.   Comment 1 Notify RN   Glucose, capillary     Status: Abnormal   Collection Time: 04/19/21  4:16 PM  Result Value Ref Range   Glucose-Capillary 136 (H) 70 - 99 mg/dL    Comment: Glucose reference range applies only to samples taken after fasting for at least 8 hours.   Comment 1 Notify RN     Current Facility-Administered Medications  Medication Dose Route Frequency Provider Last Rate Last Admin   albuterol (VENTOLIN HFA) 108 (90 Base) MCG/ACT inhaler 2 puff  2 puff Inhalation Q6H PRN Thurnell Lose, MD       aspirin EC tablet 81 mg  81 mg Oral Daily Imagene Sheller S, DO   81 mg at 04/19/21 0901   clopidogrel (PLAVIX) tablet 75 mg  75 mg Oral Daily Oswald Hillock, RPH   75 mg at 04/19/21 0901   enoxaparin (LOVENOX) injection 40 mg  40 mg Subcutaneous Q24H Imagene Sheller S, DO   40 mg at 04/18/21 2146   insulin aspart (novoLOG) injection 0-15 Units  0-15 Units Subcutaneous TID WC Imagene Sheller S, DO   3 Units at 04/19/21 1221   insulin aspart (novoLOG) injection 0-5 Units  0-5 Units Subcutaneous QHS Imagene Sheller S, DO   2 Units at 04/18/21 1314   insulin aspart protamine- aspart  (NOVOLOG MIX 70/30) injection 15 Units  15 Units Subcutaneous BID WC Imagene Sheller S, DO   15 Units at 04/19/21 1022   meclizine (ANTIVERT) tablet 25 mg  25 mg Oral  TID PRN Imagene Sheller S, DO       potassium chloride SA (KLOR-CON) CR tablet 40 mEq  40 mEq Oral BID Thurnell Lose, MD   40 mEq at 04/19/21 0901   rosuvastatin (CRESTOR) tablet 20 mg  20 mg Oral QPM Thurnell Lose, MD        Musculoskeletal: Strength & Muscle Tone: decreased Gait & Station:  did not witness Patient leans: N/A  Psychiatric Specialty Exam: Physical Exam Vitals and nursing note reviewed.  Constitutional:      Appearance: Normal appearance.  HENT:     Head: Normocephalic.  Pulmonary:     Effort: Pulmonary effort is normal.  Musculoskeletal:        General: Normal range of motion.     Cervical back: Normal range of motion.  Neurological:     General: No focal deficit present.     Mental Status: He is alert and oriented to person, place, and time.  Psychiatric:        Attention and Perception: Attention and perception normal.        Mood and Affect: Mood and affect normal.        Speech: Speech normal.        Behavior: Behavior normal. Behavior is cooperative.        Thought Content: Thought content normal.        Cognition and Memory: Cognition is impaired. Memory is impaired.        Judgment: Judgment normal.    Review of Systems  Psychiatric/Behavioral:  Positive for memory loss.   All other systems reviewed and are negative.  Blood pressure 127/63, pulse (!) 59, temperature (!) 97.5 F (36.4 C), resp. rate 18, height 6' (1.829 m), weight 90.7 kg, SpO2 99 %.Body mass index is 27.12 kg/m.  General Appearance: Casual  Eye Contact:  Good  Speech:  Clear and Coherent  Volume:  Normal  Mood:  Euthymic  Affect:  Congruent  Thought Process:  Coherent and Descriptions of Associations: Intact  Orientation:  Full (Time, Place, and Person)  Thought Content:  WDL and Logical  Suicidal Thoughts:   No  Homicidal Thoughts:  No  Memory:  Immediate;   Fair Recent;   Fair Remote;   Fair  Judgement:  Fair  Insight:  Fair  Psychomotor Activity:  Decreased  Concentration:  Concentration: Fair and Attention Span: Fair  Recall:  [employer] of Knowledge:  Good  Language:  Good  Akathisia:  No  Handed:  Right  AIMS (if indicated):     Assets:  Housing Leisure Time Resilience  ADL's:  Impaired  Cognition:  Impaired,  Mild  Sleep:        Physical Exam: Physical Exam Vitals and nursing note reviewed.  Constitutional:      Appearance: Normal appearance.  HENT:     Head: Normocephalic.  Pulmonary:     Effort: Pulmonary effort is normal.  Musculoskeletal:        General: Normal range of motion.     Cervical back: Normal range of motion.  Neurological:     General: No focal deficit present.     Mental Status: He is alert and oriented to person, place, and time.  Psychiatric:        Attention and Perception: Attention and perception normal.        Mood and Affect: Mood and affect normal.        Speech: Speech normal.  Behavior: Behavior normal. Behavior is cooperative.        Thought Content: Thought content normal.        Cognition and Memory: Cognition is impaired. Memory is impaired.        Judgment: Judgment normal.   Review of Systems  Psychiatric/Behavioral:  Positive for memory loss.   All other systems reviewed and are negative. Blood pressure 127/63, pulse (!) 59, temperature (!) 97.5 F (36.4 C), resp. rate 18, height 6' (1.829 m), weight 90.7 kg, SpO2 99 %. Body mass index is 27.12 kg/m.  Treatment Plan Summary: 75 yo male with self care deficits:  Walking, administering his insulin, preparing meals, obtaining groceries, etc.  He reports he has no one at home to assist him and that they want him to go to a care facility.  He is agreeable.  Disposition: No evidence of imminent risk to self or others at present.   Patient does not meet criteria for  psychiatric inpatient admission.  Waylan Boga, NP 04/19/2021 5:17 PM

## 2021-04-20 DIAGNOSIS — R262 Difficulty in walking, not elsewhere classified: Secondary | ICD-10-CM | POA: Diagnosis not present

## 2021-04-20 DIAGNOSIS — I350 Nonrheumatic aortic (valve) stenosis: Secondary | ICD-10-CM | POA: Diagnosis not present

## 2021-04-20 DIAGNOSIS — R55 Syncope and collapse: Secondary | ICD-10-CM | POA: Diagnosis not present

## 2021-04-20 DIAGNOSIS — I639 Cerebral infarction, unspecified: Secondary | ICD-10-CM

## 2021-04-20 DIAGNOSIS — R278 Other lack of coordination: Secondary | ICD-10-CM | POA: Diagnosis not present

## 2021-04-20 LAB — COMPREHENSIVE METABOLIC PANEL
ALT: 12 U/L (ref 0–44)
AST: 17 U/L (ref 15–41)
Albumin: 3.1 g/dL — ABNORMAL LOW (ref 3.5–5.0)
Alkaline Phosphatase: 70 U/L (ref 38–126)
Anion gap: 9 (ref 5–15)
BUN: 13 mg/dL (ref 8–23)
CO2: 26 mmol/L (ref 22–32)
Calcium: 8.8 mg/dL — ABNORMAL LOW (ref 8.9–10.3)
Chloride: 100 mmol/L (ref 98–111)
Creatinine, Ser: 0.86 mg/dL (ref 0.61–1.24)
GFR, Estimated: >60 mL/min (ref >60)
Glucose, Bld: 302 mg/dL — ABNORMAL HIGH (ref 70–99)
Potassium: 3.9 mmol/L (ref 3.5–5.1)
Sodium: 135 mmol/L (ref 135–145)
Total Bilirubin: 0.9 mg/dL (ref 0.3–1.2)
Total Protein: 5.7 g/dL — ABNORMAL LOW (ref 6.5–8.1)

## 2021-04-20 LAB — ECHOCARDIOGRAM COMPLETE
AR max vel: 1.17 cm2
AV Area VTI: 1.2 cm2
AV Area mean vel: 1.04 cm2
AV Mean grad: 28.5 mmHg
AV Peak grad: 46.5 mmHg
Ao pk vel: 3.41 m/s
Area-P 1/2: 2.55 cm2
Height: 72 in
S' Lateral: 3.33 cm
Weight: 3199.32 oz

## 2021-04-20 LAB — CBC WITH DIFFERENTIAL/PLATELET
Abs Immature Granulocytes: 0.03 10*3/uL (ref 0.00–0.07)
Basophils Absolute: 0 10*3/uL (ref 0.0–0.1)
Basophils Relative: 0 %
Eosinophils Absolute: 0.2 10*3/uL (ref 0.0–0.5)
Eosinophils Relative: 2 %
HCT: 39.6 % (ref 39.0–52.0)
Hemoglobin: 14.1 g/dL (ref 13.0–17.0)
Immature Granulocytes: 0 %
Lymphocytes Relative: 31 %
Lymphs Abs: 2.3 10*3/uL (ref 0.7–4.0)
MCH: 29.8 pg (ref 26.0–34.0)
MCHC: 35.6 g/dL (ref 30.0–36.0)
MCV: 83.7 fL (ref 80.0–100.0)
Monocytes Absolute: 0.5 10*3/uL (ref 0.1–1.0)
Monocytes Relative: 7 %
Neutro Abs: 4.3 10*3/uL (ref 1.7–7.7)
Neutrophils Relative %: 60 %
Platelets: 178 10*3/uL (ref 150–400)
RBC: 4.73 MIL/uL (ref 4.22–5.81)
RDW: 13.3 % (ref 11.5–15.5)
WBC: 7.3 10*3/uL (ref 4.0–10.5)
nRBC: 0 % (ref 0.0–0.2)

## 2021-04-20 LAB — GLUCOSE, CAPILLARY
Glucose-Capillary: 286 mg/dL — ABNORMAL HIGH (ref 70–99)
Glucose-Capillary: 79 mg/dL (ref 70–99)
Glucose-Capillary: 244 mg/dL — ABNORMAL HIGH (ref 70–99)
Glucose-Capillary: 201 mg/dL — ABNORMAL HIGH (ref 70–99)

## 2021-04-20 LAB — D-DIMER, QUANTITATIVE: D-Dimer, Quant: 0.49 ug/mL-FEU (ref 0.00–0.50)

## 2021-04-20 LAB — C-REACTIVE PROTEIN: CRP: 0.6 mg/dL (ref <1.0)

## 2021-04-20 LAB — MAGNESIUM: Magnesium: 2 mg/dL (ref 1.7–2.4)

## 2021-04-20 LAB — BRAIN NATRIURETIC PEPTIDE: B Natriuretic Peptide: 19.1 pg/mL (ref 0.0–100.0)

## 2021-04-20 MED ORDER — ATORVASTATIN CALCIUM 80 MG PO TABS
80.0000 mg | ORAL_TABLET | Freq: Every day | ORAL | Status: DC
  Administered 2021-04-21 – 2021-05-09 (×19): 80 mg via ORAL
  Filled 2021-04-20 (×9): qty 4
  Filled 2021-04-20: qty 1
  Filled 2021-04-20 (×9): qty 4
  Filled 2021-04-20: qty 1
  Filled 2021-04-20: qty 4
  Filled 2021-04-20: qty 1

## 2021-04-20 NOTE — Progress Notes (Signed)
PROGRESS NOTE                                                                                                                                                                                                             Patient Demographics:    Victor Fox, is a 75 y.o. male, DOB - 06-Jun-1946, [id number]  Outpatient Primary MD for the patient is Patient, No Pcp Per (Inactive)    LOS - 2  Admit date - 04/17/2021    Chief Complaint  Patient presents with   Dizziness       Brief Narrative (HPI from H&P)   Victor Fox is a 75 y.o. male with a past medical history of significant diabetes mellitus type 2, hypertension, hyperlipidemia, neuropathy, vertigo, carotid artery disease, coronary artery disease, history of CVA, former smoker, resented to the hospital with chief complaints of dizziness upon sitting up and standing, in the ER initial work-up negative follow-up MRI shows ischemic infarcts along with loud systolic murmur.  He was admitted for further care.   Subjective:   Patient in bed, appears comfortable, denies any headache, no fever, no chest pain or pressure, no shortness of breath , no abdominal pain. No new focal weakness.   Assessment  & Plan :     Acute on chronic dizziness mostly postural  with positive orthostatics - he does have acute CVA however he is nearly orthostatic with a very loud aortic systolic murmur.  Some chronic dizziness as well and this could be multifactorial, for now we will stabilize his blood pressure, check echocardiogram to evaluate the heart murmur, will also request cardiology to evaluate, stroke work-up as below.  2.  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  Could be small vessel but question if this could be embolic, monitor on telemetry, EKG nonacute with artifact per cardiology, no previous history of dysrhythmia, neurology and cardiology following, currently  on aspirin and Plavix for 3 weeks thereafter aspirin only, statin dose adjusted for better LDL control, A1c was in poor control and diabetic education will be provided, echocardiogram non acute, CT head noted which is nonacute, upon discharge likely will require EP follow-up for ZIO patch.  PT OT and speech following him, he lives alone and will need SNF.    3.  Incidental COVID infection.  Inflammatory markers stable.  Could have contributed to CVA, for now aspirin Plavix continue and monitor clinically.  No pulmonary symptoms   4.  Hypertension with orthostatic hypotension.  For now hold ACE inhibitor allow for permissive hypertension due to acute stroke.  5. ?  A flutter versus artifact on initial EKG in the ER, subsequent EKG within half an hour was sinus rhythm.  Per cardiology artifact, TSH stable, see #2 above.  6.  To severe AS.  Outpatient cardiology follow-up.  Continue beta-blocker, avoid big drops in blood pressure and preload.  7. Dyslipidemia.  On statin a.m. lipid panel ordered.  8. Underlying history of CAD.  Chest pain-free.  Currently on aspirin Plavix and statin.  Continue.    9.  Chronic diastolic dysfunction EF [id number].  Compensated.    10. DM type II.  On Lantus and sliding scale.  A1c suggests poor outpatient control due to hyperglycemia.  Diabetic education  Lab Results  Component Value Date   HGBA1C 9.5 (H) 04/18/2021   CBG (last 3)  Recent Labs    04/19/21 1616 04/19/21 2206 04/20/21 0806  GLUCAP 136* 197* 286*          Condition -  Guarded  Family Communication  :  None present  Code Status :  Full  Consults  :  Cards, Neuro  PUD Prophylaxis :    Procedures  :     MRI -  1. Small acute infarcts of the left medullary pyramid and superior right frontal lobe. No acute hemorrhage or mass effect. 2. Numerous chronic microhemorrhages, predominantly central. This likely indicates chronic hypertensive angiopathy. 3. Advanced atrophy and chronic small  vessel ischemic changes.  Carotid US - Extensive bilateral atherosclerotic plaque similar to that seen on prior exams without focal hemodynamically significant stenosis.  CTA - No acute intracranial abnormality. Small infarcts on brain MRI are not seen. No large vessel occlusion. Plaque along the proximal internal carotids causes less than 50% stenosis. Mild intracranial atherosclerosis.  TTE -  1. Left ventricular ejection fraction, by estimation, is 60 to 65%. The left ventricle has normal function. The left ventricle has no regional wall motion abnormalities. There is moderate left ventricular hypertrophy. Left ventricular diastolic parameters are consistent with Grade I diastolic dysfunction (impaired relaxation).  2. Right ventricular systolic function is normal. The right ventricular size is normal.  3. Left atrial size was mildly dilated.  4. The aortic valve is normal in structure. There is severe calcifcation of the aortic valve. Aortic valve regurgitation is mild. Moderate to severe aortic valve stenosis. Aortic valve area, by VTI measures 1.20 cm. Aortic valve mean gradient measures 28.5 mmHg. Aortic valve Vmax measures 3.41 m/s.      Disposition Plan  :    Status is: Inpatient  Remains inpatient appropriate because:IV treatments appropriate due to intensity of illness or inability to take PO  Dispo: The patient is from: Home              Anticipated d/c is to: SNF              Patient currently is medically stable to d/c.   Difficult to place patient No  DVT Prophylaxis  :    enoxaparin (LOVENOX) injection 40 mg Start: 04/17/21 2200     Lab Results  Component Value Date   PLT 178 04/20/2021    Diet :  Diet Order             DIET SOFT Room service appropriate? Yes; Fluid  consistency: Thin  Diet effective now                    Inpatient Medications  Scheduled Meds:  aspirin EC  81 mg Oral Daily   clopidogrel  75 mg Oral Daily   enoxaparin (LOVENOX)  injection  40 mg Subcutaneous Q24H   insulin aspart  0-15 Units Subcutaneous TID WC   insulin aspart protamine- aspart  12 Units Subcutaneous BID WC   rosuvastatin  20 mg Oral QPM   Continuous Infusions: PRN Meds:.albuterol, meclizine, ondansetron (ZOFRAN) IV  Antibiotics  :    Anti-infectives (From admission, onward)    Start     Dose/Rate Route Frequency Ordered Stop   04/17/21 2230  nirmatrelvir/ritonavir EUA (PAXLOVID) 3 tablet  Status:  Discontinued        3 tablet Oral 2 times daily 04/17/21 2218 04/17/21 2225        Time Spent in minutes  30   Lala Lund M.D on 04/20/2021 at 11:13 AM  To page go to [email or web address]   [facility] -  Office  [phone]    See all Orders from today for further details    Objective:   Vitals:   04/19/21 2147 04/20/21 0016 04/20/21 0519 04/20/21 0802  BP: (!) 155/67 (!) 171/60 (!) 170/61 (!) 154/53  Pulse: 61 (!) 58 61 (!) 53  Resp: '15 15 14 19  '$ Temp: (!) 97.5 F (36.4 C) 98.2 F (36.8 C) (!) 97.5 F (36.4 C) 98.1 F (36.7 C)  TempSrc:  Oral    SpO2: 98% 96% 97% 98%  Weight:      Height:        Wt Readings from Last 3 Encounters:  04/17/21 90.7 kg  10/18/19 90.7 kg  11/23/18 99.2 kg     Intake/Output Summary (Last 24 hours) at 04/20/2021 1113 Last data filed at 04/20/2021 0400 Gross per 24 hour  Intake 237 ml  Output 700 ml  Net -463 ml     Physical Exam  Awake Alert, No new F.N deficits, Normal affect [place].AT,PERRAL Supple Neck,No JVD, No cervical lymphadenopathy appriciated.  Symmetrical Chest wall movement, Good air movement bilaterally, CTAB RRR,No Gallops,  loud aortic systolic murmur, +ve B.Sounds, Abd Soft, No tenderness, No organomegaly appriciated, No rebound - guarding or rigidity. No Cyanosis, Clubbing or edema, No new Rash or bruise     Data Review:    CBC Recent Labs  Lab 04/17/21 1645 04/18/21 0730 04/19/21 0326 04/20/21 0604  WBC 7.2 7.8 8.3 7.3  HGB 14.2 14.8 14.9 14.1   HCT 39.2 40.9 42.4 39.6  PLT 200 191 208 178  MCV 82.4 82.5 82.3 83.7  MCH 29.8 29.8 28.9 29.8  MCHC 36.2* 36.2* 35.1 35.6  RDW 13.1 13.2 13.3 13.3  LYMPHSABS  --   --  2.7 2.3  MONOABS  --   --  0.5 0.5  EOSABS  --   --  0.2 0.2  BASOSABS  --   --  0.0 0.0    Recent Labs  Lab 04/17/21 1645 04/18/21 0730 04/19/21 0326 04/20/21 0604  NA 136 138 140 135  K 3.3* 3.2* 3.1* 3.9  CL 102 101 105 100  CO2 '27 28 28 26  '$ GLUCOSE 299* 219* 171* 302*  BUN '10 9 9 13  '$ CREATININE 0.80 0.68 0.87 0.86  CALCIUM 8.9 8.9 8.8* 8.8*  AST  --  '16 15 17  '$ ALT  --  '13 11 12  '$ ALKPHOS  --  75 75 70  BILITOT  --  1.0 1.0 0.9  ALBUMIN  --  3.7 3.4* 3.1*  MG  --  2.2 2.2 2.0  CRP  --  0.5 0.6 0.6  DDIMER  --  0.50 0.57* 0.49  TSH  --  1.137  --   --   HGBA1C  --  9.5*  --   --   BNP  --  116.6* 60.1 19.1    ------------------------------------------------------------------------------------------------------------------ Recent Labs    04/19/21 0326  CHOL 162  HDL 36*  LDLCALC 103*  TRIG 116  CHOLHDL 4.5    Lab Results  Component Value Date   HGBA1C 9.5 (H) 04/18/2021   ------------------------------------------------------------------------------------------------------------------ Recent Labs    04/18/21 0730  TSH 1.137    Cardiac Enzymes No results for input(s): CKMB, TROPONINI, MYOGLOBIN in the last 168 hours.  Invalid input(s): CK ------------------------------------------------------------------------------------------------------------------    Component Value Date/Time   BNP 19.1 04/20/2021 0604      Radiology Reports CT ANGIO HEAD NECK W WO CM  Result Date: 04/18/2021 CLINICAL DATA:  Small acute strokes on MRI brain EXAM: CT ANGIOGRAPHY HEAD AND NECK TECHNIQUE: Multidetector CT imaging of the head and neck was performed using the standard protocol during bolus administration of intravenous contrast. Multiplanar CT image reconstructions and MIPs were obtained to  evaluate the vascular anatomy. Carotid stenosis measurements (when applicable) are obtained utilizing NASCET criteria, using the distal internal carotid diameter as the denominator. CONTRAST:  61m OMNIPAQUE IOHEXOL 350 MG/ML SOLN COMPARISON:  Recent CT and MR imaging FINDINGS: CT HEAD Brain: Small infarcts on the MRI brain are not well seen. There is no acute intracranial hemorrhage. No new loss of gray-white differentiation. Stable findings of chronic microvascular ischemic changes and parenchymal volume loss. Few chronic small vessel infarcts including involvement of the right cerebellum and left thalamus. Ventricles and sulci are stable in size and configuration. Vascular: Better evaluated on CTA portion. Skull: Calvarium is unremarkable. Sinuses/Orbits: No acute finding. Other: None. Review of the MIP images confirms the above findings CTA NECK Aortic arch: Calcified plaque along the thoracic aorta. Great vessel origins are patent. Right carotid system: Patent. Calcified plaque along the distal common carotid with minimal stenosis. Calcified plaque at the bifurcation and along the proximal internal carotid. There is less than 50% stenosis. Left carotid system: Patent. Mixed plaque along the common carotid with minimal stenosis. Calcified plaque along the proximal internal carotid with less than 50% stenosis. Vertebral arteries: Patent. Right vertebral artery is slightly dominant. There is focal moderate to marked stenosis of the left vertebral artery at the C4-C5 level. Skeleton: Cervical spine degenerative changes. Prominent osteophyte extends into the right ventral and lateral spinal canal at C5-C6. Other neck: None. Upper chest: Branching opacity at the left lung apex likely reflects impacted airways. Review of the MIP images confirms the above findings CTA HEAD Anterior circulation: Intracranial internal carotid arteries are patent with calcified plaque causing mild stenosis. Anterior and middle cerebral  arteries are patent. Plaque causes mild stenosis of the left M1 MCA. There is mild to moderate stenosis of the distal left A2 ACA. Posterior circulation: Intracranial vertebral arteries are patent with calcified plaque bilaterally causing mild stenosis. Basilar artery is patent. Major cerebellar artery origins are patent. Posterior cerebral arteries are patent. Venous sinuses: Patent as allowed by contrast bolus timing. Review of the MIP images confirms the above findings IMPRESSION: No acute intracranial abnormality. Small infarcts on brain MRI are not seen. No large vessel occlusion. Plaque along  the proximal internal carotids causes less than 50% stenosis. Mild intracranial atherosclerosis. Electronically Signed   By: Macy Mis M.D.   On: 04/18/2021 14:42   CT HEAD WO CONTRAST (5MM)  Result Date: 04/17/2021 CLINICAL DATA:  Neuro deficit, acute, stroke suspected. Dizziness. Gait disturbance. EXAM: CT HEAD WITHOUT CONTRAST TECHNIQUE: Contiguous axial images were obtained from the base of the skull through the vertex without intravenous contrast. COMPARISON:  01/08/2020 FINDINGS: Brain: Advanced generalized brain volume loss. Advanced chronic small-vessel ischemic changes affecting the pons, thalami and cerebral hemispheric white matter. No discernible change since last year. No large vessel territory stroke. No mass, hemorrhage, hydrocephalus or extra-axial collection. Vascular: There is atherosclerotic calcification of the major vessels at the base of the brain. Skull: Negative Sinuses/Orbits: Clear/normal Other: None IMPRESSION: No acute finding or change since last year. Generalized brain volume loss with extensive chronic small-vessel ischemic changes throughout as described. Electronically Signed   By: Nelson Chimes M.D.   On: 04/17/2021 20:10   MR BRAIN WO CONTRAST  Result Date: 04/17/2021 CLINICAL DATA:  Dizziness EXAM: MRI HEAD WITHOUT CONTRAST TECHNIQUE: Multiplanar, multiecho pulse sequences  of the brain and surrounding structures were obtained without intravenous contrast. COMPARISON:  10/18/2019 FINDINGS: Brain: Small focus of abnormal diffusion restriction within the left medullary pyramid. There is also a small focus of ischemia in the superior right frontal lobe. Numerous chronic microhemorrhages, predominantly central. Hyperintense T2-weighted signal is moderately widespread throughout the white matter. Advanced atrophy for age. The midline structures are normal. Vascular: Major flow voids are preserved. Skull and upper cervical spine: Normal calvarium and skull base. Visualized upper cervical spine and soft tissues are normal. Sinuses/Orbits:No paranasal sinus fluid levels or advanced mucosal thickening. No mastoid or middle ear effusion. Normal orbits. IMPRESSION: 1. Small acute infarcts of the left medullary pyramid and superior right frontal lobe. No acute hemorrhage or mass effect. 2. Numerous chronic microhemorrhages, predominantly central. This likely indicates chronic hypertensive angiopathy. 3. Advanced atrophy and chronic small vessel ischemic changes. Electronically Signed   By: Ulyses Jarred M.D.   On: 04/17/2021 22:03   US Carotid Bilateral  Result Date: 04/18/2021 CLINICAL DATA:  Near syncopal event EXAM: BILATERAL CAROTID DUPLEX ULTRASOUND TECHNIQUE: Pearline Cables scale imaging, color Doppler and duplex ultrasound were performed of bilateral carotid and vertebral arteries in the neck. COMPARISON:  10/19/2019 FINDINGS: Criteria: Quantification of carotid stenosis is based on velocity parameters that correlate the residual internal carotid diameter with NASCET-based stenosis levels, using the diameter of the distal internal carotid lumen as the denominator for stenosis measurement. The following velocity measurements were obtained: RIGHT ICA: 78/10 cm/sec CCA:  [id number] cm/sec SYSTOLIC ICA/CCA RATIO:  1.7 ECA:  63 cm/sec LEFT ICA:  74/9 cm/sec CCA:  [id number] cm/sec SYSTOLIC ICA/CCA RATIO:  1.4  ECA:  66 cm/sec RIGHT CAROTID ARTERY: Preliminary grayscale images demonstrate scattered atherosclerotic plaque in the common and internal carotid arteries. The waveforms, velocities and flow velocity ratios however demonstrate no evidence of focal hemodynamically significant stenosis. RIGHT VERTEBRAL ARTERY:  Antegrade in nature. LEFT CAROTID ARTERY: Preliminary grayscale images of the left carotid system demonstrates scattered atherosclerotic plaque in the common and internal carotid artery. The waveforms, velocities and flow velocity ratios show no evidence of focal hemodynamically significant stenosis. LEFT VERTEBRAL ARTERY:  Antegrade in nature. IMPRESSION: Extensive bilateral atherosclerotic plaque similar to that seen on prior exams without focal hemodynamically significant stenosis. Electronically Signed   By: Inez Catalina M.D.   On: 04/18/2021 01:20   DG Chest Portable  1 View  Result Date: 04/17/2021 CLINICAL DATA:  COVID-19 positivity with dizziness, initial encounter EXAM: PORTABLE CHEST 1 VIEW COMPARISON:  10/08/2018 FINDINGS: Cardiac shadow is within normal limits. Aortic calcifications are seen. Lungs are clear bilaterally. Postsurgical changes in the thoracolumbar spine are noted. IMPRESSION: No active disease. Electronically Signed   By: Inez Catalina M.D.   On: 04/17/2021 21:41   DG Abd Portable 1V  Result Date: 04/19/2021 CLINICAL DATA:  Nausea and vomiting.  COVID-19 viral infection. EXAM: PORTABLE ABDOMEN - 1 VIEW COMPARISON:  None. FINDINGS: The bowel gas pattern is normal. No radio-opaque calculi or other significant radiographic abnormality are seen. Thoracolumbar spine fusion hardware noted. IMPRESSION: Negative. Electronically Signed   By: Marlaine Hind M.D.   On: 04/19/2021 18:32   ECHOCARDIOGRAM COMPLETE  Result Date: 04/20/2021    ECHOCARDIOGRAM REPORT   Patient Name:   Victor Fox Date of Exam: 04/19/2021 Medical Rec #:  [id number]      Height:       72.0 in Accession #:     [id number]     Weight:       200.0 lb Date of Birth:  Feb 23, 1946       BSA:          2.130 m Patient Age:    39 years       BP:           143/71 mmHg Patient Gender: M              HR:           70 bpm. Exam Location:  [facility] Procedure: 2D Echo, Cardiac Doppler and Color Doppler Indications:     Murmur R01.1  History:         Patient has no prior history of Echocardiogram examinations and                  Patient has prior history of Echocardiogram examinations. Risk                  Factors:Diabetes and Hypertension. CVA.  Sonographer:     Alyse Low Roar Referring Phys:  [place] Phys: Ida Rogue MD IMPRESSIONS  1. Left ventricular ejection fraction, by estimation, is 60 to 65%. The left ventricle has normal function. The left ventricle has no regional wall motion abnormalities. There is moderate left ventricular hypertrophy. Left ventricular diastolic parameters are consistent with Grade I diastolic dysfunction (impaired relaxation).  2. Right ventricular systolic function is normal. The right ventricular size is normal.  3. Left atrial size was mildly dilated.  4. The aortic valve is normal in structure. There is severe calcifcation of the aortic valve. Aortic valve regurgitation is mild. Moderate to severe aortic valve stenosis. Aortic valve area, by VTI measures 1.20 cm. Aortic valve mean gradient measures 28.5 mmHg. Aortic valve Vmax measures 3.41 m/s. FINDINGS  Left Ventricle: Left ventricular ejection fraction, by estimation, is 60 to 65%. The left ventricle has normal function. The left ventricle has no regional wall motion abnormalities. The left ventricular internal cavity size was normal in size. There is  moderate left ventricular hypertrophy. Left ventricular diastolic parameters are consistent with Grade I diastolic dysfunction (impaired relaxation). Right Ventricle: The right ventricular size is normal. No increase in right ventricular wall thickness. Right ventricular  systolic function is normal. Left Atrium: Left atrial size was mildly dilated. Right Atrium: Right atrial size was normal in size. Pericardium: There is no evidence of  pericardial effusion. Mitral Valve: The mitral valve is normal in structure. No evidence of mitral valve regurgitation. No evidence of mitral valve stenosis. Tricuspid Valve: The tricuspid valve is normal in structure. Tricuspid valve regurgitation is not demonstrated. No evidence of tricuspid stenosis. Aortic Valve: The aortic valve is normal in structure. There is severe calcifcation of the aortic valve. Aortic valve regurgitation is mild. Moderate to severe aortic stenosis is present. Aortic valve mean gradient measures 28.5 mmHg. Aortic valve peak gradient measures 46.5 mmHg. Aortic valve area, by VTI measures 1.20 cm. Pulmonic Valve: The pulmonic valve was not well visualized. Pulmonic valve regurgitation is not visualized. No evidence of pulmonic stenosis. Aorta: The aortic root is normal in size and structure. Venous: The pulmonary veins were not well visualized. The inferior vena cava is normal in size with greater than 50% respiratory variability, suggesting right atrial pressure of 3 mmHg. IAS/Shunts: No atrial level shunt detected by color flow Doppler.  LEFT VENTRICLE PLAX 2D LVIDd:         5.01 cm  Diastology LVIDs:         3.33 cm  LV e' medial:    4.35 cm/s LV PW:         1.23 cm  LV E/e' medial:  14.3 LV IVS:        1.41 cm  LV e' lateral:   4.79 cm/s LVOT diam:     2.20 cm  LV E/e' lateral: 13.0 LV SV:         82 LV SV Index:   39 LVOT Area:     3.80 cm  RIGHT VENTRICLE RV Basal diam:  3.64 cm RV Mid diam:    3.35 cm RV S prime:     17.20 cm/s TAPSE (M-mode): 2.4 cm LEFT ATRIUM             Index       RIGHT ATRIUM           Index LA diam:        3.80 cm 1.78 cm/m  RA Area:     16.30 cm LA Vol (A2C):   76.1 ml 35.72 ml/m RA Volume:   39.00 ml  18.31 ml/m LA Vol (A4C):   65.7 ml 30.84 ml/m LA Biplane Vol: 72.0 ml 33.80 ml/m   AORTIC VALVE                    PULMONIC VALVE AV Area (Vmax):    1.17 cm     PV Vmax:        1.19 m/s AV Area (Vmean):   1.04 cm     PV Peak grad:   5.7 mmHg AV Area (VTI):     1.20 cm     RVOT Peak grad: 5 mmHg AV Vmax:           341.00 cm/s AV Vmean:          251.000 cm/s AV VTI:            0.687 m AV Peak Grad:      46.5 mmHg AV Mean Grad:      28.5 mmHg LVOT Vmax:         105.00 cm/s LVOT Vmean:        68.700 cm/s LVOT VTI:          0.216 m LVOT/AV VTI ratio: 0.31  AORTA Ao Asc diam: 3.20 cm MITRAL VALVE MV Area (PHT): 2.55 cm  SHUNTS MV Decel Time: 298 msec     Systemic VTI:  0.22 m MV E velocity: 62.40 cm/s   Systemic Diam: 2.20 cm MV A velocity: 120.00 cm/s MV E/A ratio:  0.52 MV A Prime:    7.0 cm/s Ida Rogue MD Electronically signed by Ida Rogue MD Signature Date/Time: 04/20/2021/9:14:23 AM    Final

## 2021-04-20 NOTE — Progress Notes (Signed)
Occupational Therapy Treatment Patient Details Name: Victor Fox MRN: [id number] DOB: 1946/08/31 Today's Date: 04/20/2021    History of present illness 75 y.o. male with a past medical history of significant diabetes mellitus type 2, hypertension, hyperlipidemia, neuropathy, vertigo, carotid artery disease, coronary artery disease, history of CVA, former smoker, resented to the hospital with c/o unsteainess/falls starting 8/13, MRI shows ischemic infarct.   OT comments  Pt seen for OT Tx this date to f/u re: safety with ADLs/ADL mobility. OT engages pt in toileting and grooming tasks with CGA to MIN A for fxl mobility with RW and transfers. Cues for safety. Pt tolerates well. Left in chair with all needs met and in reach. CNA presents to complete VS assessment. Will continue to follow per POC.    Follow Up Recommendations  SNF    Equipment Recommendations  3 in 1 bedside commode    Recommendations for Other Services      Precautions / Restrictions Precautions Precautions: Fall Restrictions Weight Bearing Restrictions: No       Mobility Bed Mobility Overal bed mobility: Needs Assistance Bed Mobility: Supine to Sit     Supine to sit: Supervision;HOB elevated     General bed mobility comments: use of bed rails    Transfers Overall transfer level: Needs assistance Equipment used: Rolling walker (2 wheeled) Transfers: Sit to/from Stand Sit to Stand: Min guard;From elevated surface              Balance Overall balance assessment: Needs assistance Sitting-balance support: No upper extremity supported;Feet supported Sitting balance-Leahy Scale: Fair     Standing balance support: During functional activity;No upper extremity supported Standing balance-Leahy Scale: Poor Standing balance comment: UE support and MIN A with fxl mobility                           ADL either performed or assessed with clinical judgement   ADL Overall ADL's : Needs  assistance/impaired     Grooming: Supervision/safety;Min guard;Standing;Wash/dry hands;Cueing for safety                   Toilet Transfer: Min guard;Minimal assistance;BSC;Grab bars;RW [employer] forces technical [employer] Details (indicate cue type and reason): fxl mobility to/from restroom, BSC over commode to elevate, cues for use of grab bar to control descent Toileting- Clothing Manipulation and Hygiene: Set up;Min guard;Sit to/from stand       Functional mobility during ADLs: Min guard;Minimal assistance;Cueing for safety;Rolling walker (to/from restroom)       Vision Patient Visual Report: No change from baseline     Perception     Praxis      Cognition Arousal/Alertness: Awake/alert Behavior During Therapy: WFL for tasks assessed/performed Overall Cognitive Status: Impaired/Different from baseline Area of Impairment: Orientation;Safety/judgement                 Orientation Level: Disoriented to;Place;Time       Safety/Judgement: Decreased awareness of safety;Decreased awareness of deficits     General Comments: Pt appears slightly HOH which somewhat impairs communication. Overall appropraite with commands.        Exercises Other Exercises Other Exercises: OT engages pt in toileting and grooming tasks with CGA to MIN A for fxl mobility and transfers and cues for safety.   Shoulder Instructions       General Comments      Pertinent Vitals/ Pain       Pain Assessment: No/denies pain  Home Living  Prior Functioning/Environment              Frequency  Min 1X/week        Progress Toward Goals  OT Goals(current goals can now be found in the care plan section)  Progress towards OT goals: Progressing toward goals  Acute Rehab OT Goals Patient Stated Goal: get stronger OT Goal Formulation: With patient Time For Goal Achievement: 05/02/21 Potential to Achieve Goals: Good  Plan Discharge plan  remains appropriate    Co-evaluation                 AM-PAC OT "6 Clicks" Daily Activity     Outcome Measure   Help from another person eating meals?: None Help from another person taking care of personal grooming?: A Little Help from another person toileting, which includes using toliet, bedpan, or urinal?: A Little Help from another person bathing (including washing, rinsing, drying)?: A Little Help from another person to put on and taking off regular upper body clothing?: A Little Help from another person to put on and taking off regular lower body clothing?: A Little 6 Click Score: 19    End of Session Equipment Utilized During Treatment: Rolling walker  OT Visit Diagnosis: Other abnormalities of gait and mobility (R26.89);Muscle weakness (generalized) (M62.81)   Activity Tolerance Patient tolerated treatment well   Patient Left with call bell/phone within reach;in chair;with chair alarm set   Nurse Communication Mobility status        Time: 5176-1607 OT Time Calculation (min): 14 min  Charges: OT General Charges $OT Visit: 1 Visit OT Treatments $Self Care/Home Management : 8-22 mins  Gerrianne Scale, [place], OTR/L ascom [phone] 04/20/21, 5:09 PM

## 2021-04-20 NOTE — Plan of Care (Signed)
Neurology Plan of Care  75 y.o. male with a past medical history of significant diabetes mellitus type 2, hypertension, hyperlipidemia, neuropathy, vertigo, carotid artery disease, coronary artery disease, history of CVA, former smoker, who presented to ED with complaint of dizziness. MRI brain showed small acute infarcts in both anterior and posterior circulation suggestive of possible central embolic source. There was initial concern for possible a flutter on monitor in ED but subsequent EKGs showed NSR and cardiology consulted felt monitor finding was 2/2 artifact.  TTE showed no intracardiac clot and no other emergent findings. No further inpatient neurologic workup indicated. He will need Zio patch placed with cardiology after discharge to further evaluate for potential a fib. Patient has chronic microhemorrhages (largest L cerebellum, but throughout both hemispheres) that appear hypertensive in nature. CROMIS-2 study suggests 3-4x higher risk of intracranial hemorrhage in patients anticoagulated for a fib or flutter that have cerebral microhemorrhages vs those without microhemorrhages. However his CHADS-VASc score is 6 and is very high risk of ischemic stroke therefore if a fib were to be detected on ambulatory cardiac monitoring, the benefit of anticoagulation would outweigh the risk.  LDL 103 A1c 9.5  Recommendations:  - No further inpatient neurologic workup indicated - ASA '81mg'$  daily +plavix '75mg'$  daily x21 days f/b ASA '81mg'$  daily after that - Atorvastatin '80mg'$  daily - Stroke education - Zio patch after hospital discharge - I will arrange f/u with outpatient neurology   Neurology will not continue to actively follow. Please re-engage if new neurologic concerns arise.  Su Monks, MD [facility] [phone]  If 7pm- 7am, please page neurology on call as listed in [place].

## 2021-04-20 NOTE — Progress Notes (Signed)
Progress Note  Patient Name: Victor Fox Date of Encounter: 04/20/2021  [facility] Cardiologist: new to [facility]  Subjective   No complaints Working with physical therapy Reports legs getting stronger Discussed with nursing, visual field deficits on the left  Inpatient Medications    Scheduled Meds:  aspirin EC  81 mg Oral Daily   clopidogrel  75 mg Oral Daily   enoxaparin (LOVENOX) injection  40 mg Subcutaneous Q24H   insulin aspart  0-15 Units Subcutaneous TID WC   insulin aspart protamine- aspart  12 Units Subcutaneous BID WC   rosuvastatin  20 mg Oral QPM   Continuous Infusions:  PRN Meds: albuterol, meclizine, ondansetron (ZOFRAN) IV   Vital Signs    Vitals:   04/19/21 2147 04/20/21 0016 04/20/21 0519 04/20/21 0802  BP: (!) 155/67 (!) 171/60 (!) 170/61 (!) 154/53  Pulse: 61 (!) 58 61 (!) 53  Resp: '15 15 14 19  '$ Temp: (!) 97.5 F (36.4 C) 98.2 F (36.8 C) (!) 97.5 F (36.4 C) 98.1 F (36.7 C)  TempSrc:  Oral    SpO2: 98% 96% 97% 98%  Weight:      Height:        Intake/Output Summary (Last 24 hours) at 04/20/2021 1145 Last data filed at 04/20/2021 0400 Gross per 24 hour  Intake 237 ml  Output 700 ml  Net -463 ml   Last 3 Weights 04/17/2021 04/17/2021 10/18/2019  Weight (lbs) 199 lb 15.3 oz 200 lb 200 lb  Weight (kg) 90.7 kg 90.719 kg 90.719 kg      Telemetry    Normal sinus rhythm- Personally Reviewed  ECG    - Personally Reviewed  Physical Exam   GEN: No acute distress.   Neck: No JVD Cardiac: RRR, no murmurs, rubs, or gallops.  Respiratory: Clear to auscultation bilaterally. GI: Soft, nontender, non-distended  MS: No edema; No deformity. Neuro:  Nonfocal  Psych: Normal affect   Labs    High Sensitivity Troponin:  No results for input(s): TROPONINIHS in the last 720 hours.    Chemistry Recent Labs  Lab 04/18/21 0730 04/19/21 0326 04/20/21 0604  NA 138 140 135  K 3.2* 3.1* 3.9  CL 101 105 100  CO2 '28 28 26  '$ GLUCOSE 219*  171* 302*  BUN '9 9 13  '$ CREATININE 0.68 0.87 0.86  CALCIUM 8.9 8.8* 8.8*  PROT 6.4* 6.1* 5.7*  ALBUMIN 3.7 3.4* 3.1*  AST '16 15 17  '$ ALT '13 11 12  '$ ALKPHOS 75 75 70  BILITOT 1.0 1.0 0.9  GFRNONAA >60 >60 >60  ANIONGAP '9 7 9     '$ Hematology Recent Labs  Lab 04/18/21 0730 04/19/21 0326 04/20/21 0604  WBC 7.8 8.3 7.3  RBC 4.96 5.15 4.73  HGB 14.8 14.9 14.1  HCT 40.9 42.4 39.6  MCV 82.5 82.3 83.7  MCH 29.8 28.9 29.8  MCHC 36.2* 35.1 35.6  RDW 13.2 13.3 13.3  PLT 191 208 178    BNP Recent Labs  Lab 04/18/21 0730 04/19/21 0326 04/20/21 0604  BNP 116.6* 60.1 19.1     DDimer  Recent Labs  Lab 04/18/21 0730 04/19/21 0326 04/20/21 0604  DDIMER 0.50 0.57* 0.49     Radiology    CT ANGIO HEAD NECK W WO CM  Result Date: 04/18/2021 CLINICAL DATA:  Small acute strokes on MRI brain EXAM: CT ANGIOGRAPHY HEAD AND NECK TECHNIQUE: Multidetector CT imaging of the head and neck was performed using the standard protocol during bolus administration of  intravenous contrast. Multiplanar CT image reconstructions and MIPs were obtained to evaluate the vascular anatomy. Carotid stenosis measurements (when applicable) are obtained utilizing NASCET criteria, using the distal internal carotid diameter as the denominator. CONTRAST:  48m OMNIPAQUE IOHEXOL 350 MG/ML SOLN COMPARISON:  Recent CT and MR imaging FINDINGS: CT HEAD Brain: Small infarcts on the MRI brain are not well seen. There is no acute intracranial hemorrhage. No new loss of gray-white differentiation. Stable findings of chronic microvascular ischemic changes and parenchymal volume loss. Few chronic small vessel infarcts including involvement of the right cerebellum and left thalamus. Ventricles and sulci are stable in size and configuration. Vascular: Better evaluated on CTA portion. Skull: Calvarium is unremarkable. Sinuses/Orbits: No acute finding. Other: None. Review of the MIP images confirms the above findings CTA NECK Aortic arch:  Calcified plaque along the thoracic aorta. Great vessel origins are patent. Right carotid system: Patent. Calcified plaque along the distal common carotid with minimal stenosis. Calcified plaque at the bifurcation and along the proximal internal carotid. There is less than 50% stenosis. Left carotid system: Patent. Mixed plaque along the common carotid with minimal stenosis. Calcified plaque along the proximal internal carotid with less than 50% stenosis. Vertebral arteries: Patent. Right vertebral artery is slightly dominant. There is focal moderate to marked stenosis of the left vertebral artery at the C4-C5 level. Skeleton: Cervical spine degenerative changes. Prominent osteophyte extends into the right ventral and lateral spinal canal at C5-C6. Other neck: None. Upper chest: Branching opacity at the left lung apex likely reflects impacted airways. Review of the MIP images confirms the above findings CTA HEAD Anterior circulation: Intracranial internal carotid arteries are patent with calcified plaque causing mild stenosis. Anterior and middle cerebral arteries are patent. Plaque causes mild stenosis of the left M1 MCA. There is mild to moderate stenosis of the distal left A2 ACA. Posterior circulation: Intracranial vertebral arteries are patent with calcified plaque bilaterally causing mild stenosis. Basilar artery is patent. Major cerebellar artery origins are patent. Posterior cerebral arteries are patent. Venous sinuses: Patent as allowed by contrast bolus timing. Review of the MIP images confirms the above findings IMPRESSION: No acute intracranial abnormality. Small infarcts on brain MRI are not seen. No large vessel occlusion. Plaque along the proximal internal carotids causes less than 50% stenosis. Mild intracranial atherosclerosis. Electronically Signed   By: PMacy MisM.D.   On: 04/18/2021 14:42   DG Abd Portable 1V  Result Date: 04/19/2021 CLINICAL DATA:  Nausea and vomiting.  COVID-19 viral  infection. EXAM: PORTABLE ABDOMEN - 1 VIEW COMPARISON:  None. FINDINGS: The bowel gas pattern is normal. No radio-opaque calculi or other significant radiographic abnormality are seen. Thoracolumbar spine fusion hardware noted. IMPRESSION: Negative. Electronically Signed   By: JMarlaine HindM.D.   On: 04/19/2021 18:32   ECHOCARDIOGRAM COMPLETE  Result Date: 04/20/2021    ECHOCARDIOGRAM REPORT   Patient Name:   Victor ILEYDate of Exam: 04/19/2021 Medical Rec #:  [id number]     Height:       72.0 in Accession #:    [id number]    Weight:       200.0 lb Date of Birth:  [birth year]      BSA:          2.130 m Patient Age:    89years       BP:           143/71 mmHg Patient Gender: M  HR:           70 bpm. Exam Location:  [facility] Procedure: 2D Echo, Cardiac Doppler and Color Doppler Indications:     Murmur R01.1  History:         Patient has no prior history of Echocardiogram examinations and                  Patient has prior history of Echocardiogram examinations. Risk                  Factors:Diabetes and Hypertension. CVA.  Sonographer:     Alyse Low Roar Referring Phys:  [place] Phys: Ida Rogue MD IMPRESSIONS  1. Left ventricular ejection fraction, by estimation, is 60 to 65%. The left ventricle has normal function. The left ventricle has no regional wall motion abnormalities. There is moderate left ventricular hypertrophy. Left ventricular diastolic parameters are consistent with Grade I diastolic dysfunction (impaired relaxation).  2. Right ventricular systolic function is normal. The right ventricular size is normal.  3. Left atrial size was mildly dilated.  4. The aortic valve is normal in structure. There is severe calcifcation of the aortic valve. Aortic valve regurgitation is mild. Moderate to severe aortic valve stenosis. Aortic valve area, by VTI measures 1.20 cm. Aortic valve mean gradient measures 28.5 mmHg. Aortic valve Vmax measures 3.41 m/s. FINDINGS  Left  Ventricle: Left ventricular ejection fraction, by estimation, is 60 to 65%. The left ventricle has normal function. The left ventricle has no regional wall motion abnormalities. The left ventricular internal cavity size was normal in size. There is  moderate left ventricular hypertrophy. Left ventricular diastolic parameters are consistent with Grade I diastolic dysfunction (impaired relaxation). Right Ventricle: The right ventricular size is normal. No increase in right ventricular wall thickness. Right ventricular systolic function is normal. Left Atrium: Left atrial size was mildly dilated. Right Atrium: Right atrial size was normal in size. Pericardium: There is no evidence of pericardial effusion. Mitral Valve: The mitral valve is normal in structure. No evidence of mitral valve regurgitation. No evidence of mitral valve stenosis. Tricuspid Valve: The tricuspid valve is normal in structure. Tricuspid valve regurgitation is not demonstrated. No evidence of tricuspid stenosis. Aortic Valve: The aortic valve is normal in structure. There is severe calcifcation of the aortic valve. Aortic valve regurgitation is mild. Moderate to severe aortic stenosis is present. Aortic valve mean gradient measures 28.5 mmHg. Aortic valve peak gradient measures 46.5 mmHg. Aortic valve area, by VTI measures 1.20 cm. Pulmonic Valve: The pulmonic valve was not well visualized. Pulmonic valve regurgitation is not visualized. No evidence of pulmonic stenosis. Aorta: The aortic root is normal in size and structure. Venous: The pulmonary veins were not well visualized. The inferior vena cava is normal in size with greater than 50% respiratory variability, suggesting right atrial pressure of 3 mmHg. IAS/Shunts: No atrial level shunt detected by color flow Doppler.  LEFT VENTRICLE PLAX 2D LVIDd:         5.01 cm  Diastology LVIDs:         3.33 cm  LV e' medial:    4.35 cm/s LV PW:         1.23 cm  LV E/e' medial:  14.3 LV IVS:        1.41  cm  LV e' lateral:   4.79 cm/s LVOT diam:     2.20 cm  LV E/e' lateral: 13.0 LV SV:  82 LV SV Index:   39 LVOT Area:     3.80 cm  RIGHT VENTRICLE RV Basal diam:  3.64 cm RV Mid diam:    3.35 cm RV S prime:     17.20 cm/s TAPSE (M-mode): 2.4 cm LEFT ATRIUM             Index       RIGHT ATRIUM           Index LA diam:        3.80 cm 1.78 cm/m  RA Area:     16.30 cm LA Vol (A2C):   76.1 ml 35.72 ml/m RA Volume:   39.00 ml  18.31 ml/m LA Vol (A4C):   65.7 ml 30.84 ml/m LA Biplane Vol: 72.0 ml 33.80 ml/m  AORTIC VALVE                    PULMONIC VALVE AV Area (Vmax):    1.17 cm     PV Vmax:        1.19 m/s AV Area (Vmean):   1.04 cm     PV Peak grad:   5.7 mmHg AV Area (VTI):     1.20 cm     RVOT Peak grad: 5 mmHg AV Vmax:           341.00 cm/s AV Vmean:          251.000 cm/s AV VTI:            0.687 m AV Peak Grad:      46.5 mmHg AV Mean Grad:      28.5 mmHg LVOT Vmax:         105.00 cm/s LVOT Vmean:        68.700 cm/s LVOT VTI:          0.216 m LVOT/AV VTI ratio: 0.31  AORTA Ao Asc diam: 3.20 cm MITRAL VALVE MV Area (PHT): 2.55 cm     SHUNTS MV Decel Time: 298 msec     Systemic VTI:  0.22 m MV E velocity: 62.40 cm/s   Systemic Diam: 2.20 cm MV A velocity: 120.00 cm/s MV E/A ratio:  0.52 MV A Prime:    7.0 cm/s Ida Rogue MD Electronically signed by Ida Rogue MD Signature Date/Time: 04/20/2021/9:14:23 AM    Final     Cardiac Studies   Echo  1. Left ventricular ejection fraction, by estimation, is 60 to 65%. The  left ventricle has normal function. The left ventricle has no regional  wall motion abnormalities. There is moderate left ventricular hypertrophy.  Left ventricular diastolic  parameters are consistent with Grade I diastolic dysfunction (impaired  relaxation).   2. Right ventricular systolic function is normal. The right ventricular  size is normal.   3. Left atrial size was mildly dilated.   4. The aortic valve is normal in structure. There is severe calcifcation  of the  aortic valve. Aortic valve regurgitation is mild. Moderate to  severe aortic valve stenosis. Aortic valve area, by VTI measures 1.20 cm.  Aortic valve mean gradient measures  28.5 mmHg. Aortic valve Vmax measures 3.41 m/s.   Patient Profile     Victor Fox is a 20-year gentleman with history of diabetes type 2, strokes, PAD, mild to moderate carotid disease, smoker, neuropathy, presenting with leg weakness, inability to stand  Assessment & Plan    Leg weakness, falls Likely secondary to acute strokes  MRI noting acute strokes In addition has COVID-19 infection,  glucose levels also running markedly elevated,  On arrival, glucose greater than 500 in the urine, 300 serum glucose, unable to treat with insulin secondary to poor eyesight -Aortic valve stenosis less likely a contributor   Aortic valve stenosis Moderate aortic valve stenosis in February 2021 Echocardiogram this admission moderate to severe stenosis Medical management at this time, outpatient follow-up in clinic Less likely a contributor to current presentation   Poorly controlled diabetes type 2 A1c 9.5 Unable to give himself insulin secondary to poor eyesight   Acute infarcts, small Left medullary pyramid and superior right frontal lobe on aspirin Plavix We will arrange ZIO monitor at follow-up appointment in clinic    Total encounter time more than 25 minutes  Greater than 50% was spent in counseling and coordination of care with the patient     For questions or updates, please contact [facility] Please consult [email or web address] for contact info under        Signed, Ida Rogue, MD  04/20/2021, 11:46 AM

## 2021-04-21 DIAGNOSIS — R262 Difficulty in walking, not elsewhere classified: Secondary | ICD-10-CM | POA: Diagnosis not present

## 2021-04-21 DIAGNOSIS — R278 Other lack of coordination: Secondary | ICD-10-CM | POA: Diagnosis not present

## 2021-04-21 DIAGNOSIS — I639 Cerebral infarction, unspecified: Secondary | ICD-10-CM | POA: Diagnosis not present

## 2021-04-21 DIAGNOSIS — R55 Syncope and collapse: Secondary | ICD-10-CM | POA: Diagnosis not present

## 2021-04-21 LAB — CBC WITH DIFFERENTIAL/PLATELET
Abs Immature Granulocytes: 0.03 10*3/uL (ref 0.00–0.07)
Basophils Absolute: 0 10*3/uL (ref 0.0–0.1)
Basophils Relative: 0 %
Eosinophils Absolute: 0.2 10*3/uL (ref 0.0–0.5)
Eosinophils Relative: 2 %
HCT: 40.9 % (ref 39.0–52.0)
Hemoglobin: 14.2 g/dL (ref 13.0–17.0)
Immature Granulocytes: 0 %
Lymphocytes Relative: 33 %
Lymphs Abs: 2.6 10*3/uL (ref 0.7–4.0)
MCH: 29.1 pg (ref 26.0–34.0)
MCHC: 34.7 g/dL (ref 30.0–36.0)
MCV: 83.8 fL (ref 80.0–100.0)
Monocytes Absolute: 0.5 10*3/uL (ref 0.1–1.0)
Monocytes Relative: 6 %
Neutro Abs: 4.6 10*3/uL (ref 1.7–7.7)
Neutrophils Relative %: 59 %
Platelets: 181 10*3/uL (ref 150–400)
RBC: 4.88 MIL/uL (ref 4.22–5.81)
RDW: 13.2 % (ref 11.5–15.5)
WBC: 8 10*3/uL (ref 4.0–10.5)
nRBC: 0 % (ref 0.0–0.2)

## 2021-04-21 LAB — GLUCOSE, CAPILLARY
Glucose-Capillary: 178 mg/dL — ABNORMAL HIGH (ref 70–99)
Glucose-Capillary: 173 mg/dL — ABNORMAL HIGH (ref 70–99)
Glucose-Capillary: 175 mg/dL — ABNORMAL HIGH (ref 70–99)

## 2021-04-21 LAB — COMPREHENSIVE METABOLIC PANEL
ALT: 13 U/L (ref 0–44)
AST: 14 U/L — ABNORMAL LOW (ref 15–41)
Albumin: 3.2 g/dL — ABNORMAL LOW (ref 3.5–5.0)
Alkaline Phosphatase: 68 U/L (ref 38–126)
Anion gap: 6 (ref 5–15)
BUN: 14 mg/dL (ref 8–23)
CO2: 29 mmol/L (ref 22–32)
Calcium: 9.1 mg/dL (ref 8.9–10.3)
Chloride: 104 mmol/L (ref 98–111)
Creatinine, Ser: 0.72 mg/dL (ref 0.61–1.24)
GFR, Estimated: >60 mL/min (ref >60)
Glucose, Bld: 184 mg/dL — ABNORMAL HIGH (ref 70–99)
Potassium: 3.7 mmol/L (ref 3.5–5.1)
Sodium: 139 mmol/L (ref 135–145)
Total Bilirubin: 0.7 mg/dL (ref 0.3–1.2)
Total Protein: 5.9 g/dL — ABNORMAL LOW (ref 6.5–8.1)

## 2021-04-21 LAB — C-REACTIVE PROTEIN: CRP: 0.6 mg/dL (ref <1.0)

## 2021-04-21 LAB — MAGNESIUM: Magnesium: 2.1 mg/dL (ref 1.7–2.4)

## 2021-04-21 LAB — D-DIMER, QUANTITATIVE: D-Dimer, Quant: 0.46 ug/mL-FEU (ref 0.00–0.50)

## 2021-04-21 LAB — BRAIN NATRIURETIC PEPTIDE: B Natriuretic Peptide: 45 pg/mL (ref 0.0–100.0)

## 2021-04-21 MED ORDER — AMLODIPINE BESYLATE 10 MG PO TABS
10.0000 mg | ORAL_TABLET | Freq: Every day | ORAL | Status: DC
  Administered 2021-04-22 – 2021-04-29 (×8): 10 mg via ORAL
  Filled 2021-04-21 (×8): qty 1

## 2021-04-21 MED ORDER — AMLODIPINE BESYLATE 10 MG PO TABS: 10.0000 mg | ORAL_TABLET | Freq: Every day | ORAL | Status: DC

## 2021-04-21 MED ORDER — HYDRALAZINE HCL 20 MG/ML IJ SOLN: 10.0000 mg | Freq: Four times a day (QID) | INTRAMUSCULAR | Status: DC | PRN

## 2021-04-21 MED ORDER — AMLODIPINE BESYLATE 5 MG PO TABS: 5.0000 mg | ORAL_TABLET | Freq: Every day | ORAL | Status: DC

## 2021-04-21 NOTE — Progress Notes (Signed)
PROGRESS NOTE                                                                                                                                                                                                             Patient Demographics:    Victor Fox, is a 75 y.o. male, DOB - [birth year], NX:[id number]  Outpatient Primary MD for the patient is Patient, No Pcp Per (Inactive)    LOS - 3  Admit date - 04/17/2021    Chief Complaint  Patient presents with   Dizziness       Brief Narrative (HPI from H&P)   Victor Fox is a 75 y.o. male with a past medical history of significant diabetes mellitus type 2, hypertension, hyperlipidemia, neuropathy, vertigo, carotid artery disease, coronary artery disease, history of CVA, former smoker, resented to the hospital with chief complaints of dizziness upon sitting up and standing, in the ER initial work-up negative follow-up MRI shows ischemic infarcts along with loud systolic murmur.  He was admitted for further care.   Subjective:   Patient in bed, appears comfortable, denies any headache, no fever, no chest pain or pressure, no shortness of breath , no abdominal pain. No new focal weakness.   Assessment  & Plan :     Acute on chronic dizziness mostly postural  with positive orthostatics - he does have acute CVA however he is nearly orthostatic with a very loud aortic systolic murmur.  Some chronic dizziness as well and this could be multifactorial, for now we will stabilize his blood pressure, check echocardiogram to evaluate the heart murmur, will also request cardiology to evaluate, stroke work-up as below.  2.  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  Could be small vessel but question if this could be embolic, monitor on telemetry, EKG nonacute with artifact per cardiology, no previous history of dysrhythmia, neurology and cardiology following, currently  on aspirin and Plavix for 3 weeks thereafter aspirin only, statin dose adjusted for better LDL control, A1c was in poor control and diabetic education will be provided, echocardiogram non acute, CT head noted which is nonacute, upon discharge likely will require EP follow-up for ZIO patch.  PT OT and speech following him, he lives alone and will need SNF.    3.  Incidental COVID infection.  Inflammatory markers stable.  Could have contributed to CVA, for now aspirin Plavix continue and monitor clinically.  No pulmonary symptoms   4.  Hypertension with orthostatic hypotension.  For now hold ACE inhibitor allow for permissive hypertension due to acute stroke.  5. ?  A flutter versus artifact on initial EKG in the ER, subsequent EKG within half an hour was sinus rhythm.  Per cardiology artifact, TSH stable, see #2 above.  6.  To severe AS.  Outpatient cardiology follow-up.  Continue beta-blocker, avoid big drops in blood pressure and preload.  7. Dyslipidemia.  On statin a.m. lipid panel ordered.  8. Underlying history of CAD.  Chest pain-free.  Currently on aspirin Plavix and statin.  Continue.    9.  Chronic diastolic dysfunction EF [id number].  Compensated.    10. DM type II.  On Lantus and sliding scale.  A1c suggests poor outpatient control due to hyperglycemia.  Diabetic education  Lab Results  Component Value Date   HGBA1C 9.5 (H) 04/18/2021   CBG (last 3)  Recent Labs    04/20/21 2018 04/21/21 0736 04/21/21 1126  GLUCAP 201* 178* 173*          Condition -  Guarded  Family Communication  :  None present  Code Status :  Full  Consults  :  Cards, Neuro  PUD Prophylaxis :    Procedures  :     MRI -  1. Small acute infarcts of the left medullary pyramid and superior right frontal lobe. No acute hemorrhage or mass effect. 2. Numerous chronic microhemorrhages, predominantly central. This likely indicates chronic hypertensive angiopathy. 3. Advanced atrophy and chronic small  vessel ischemic changes.  Carotid US - Extensive bilateral atherosclerotic plaque similar to that seen on prior exams without focal hemodynamically significant stenosis.  CTA - No acute intracranial abnormality. Small infarcts on brain MRI are not seen. No large vessel occlusion. Plaque along the proximal internal carotids causes less than 50% stenosis. Mild intracranial atherosclerosis.  TTE -  1. Left ventricular ejection fraction, by estimation, is 60 to 65%. The left ventricle has normal function. The left ventricle has no regional wall motion abnormalities. There is moderate left ventricular hypertrophy. Left ventricular diastolic parameters are consistent with Grade I diastolic dysfunction (impaired relaxation).  2. Right ventricular systolic function is normal. The right ventricular size is normal.  3. Left atrial size was mildly dilated.  4. The aortic valve is normal in structure. There is severe calcifcation of the aortic valve. Aortic valve regurgitation is mild. Moderate to severe aortic valve stenosis. Aortic valve area, by VTI measures 1.20 cm. Aortic valve mean gradient measures 28.5 mmHg. Aortic valve Vmax measures 3.41 m/s.      Disposition Plan  :    Status is: Inpatient  Remains inpatient appropriate because:IV treatments appropriate due to intensity of illness or inability to take PO  Dispo: The patient is from: Home              Anticipated d/c is to: SNF              Patient currently is medically stable to d/c.   Difficult to place patient No  DVT Prophylaxis  :     enoxaparin (LOVENOX) injection 40 mg Start: 04/17/21 2200   Lab Results  Component Value Date   PLT 181 04/21/2021    Diet :  Diet Order             DIET SOFT Room service appropriate? Yes; Fluid consistency:  Thin  Diet effective now                    Inpatient Medications  Scheduled Meds:  [START ON 04/22/2021] amLODipine  10 mg Oral Daily   aspirin EC  81 mg Oral Daily    atorvastatin  80 mg Oral Daily   clopidogrel  75 mg Oral Daily   enoxaparin (LOVENOX) injection  40 mg Subcutaneous Q24H   insulin aspart  0-15 Units Subcutaneous TID WC   insulin aspart protamine- aspart  12 Units Subcutaneous BID WC   Continuous Infusions: PRN Meds:.albuterol, hydrALAZINE, meclizine, ondansetron (ZOFRAN) IV  Antibiotics  :    Anti-infectives (From admission, onward)    Start     Dose/Rate Route Frequency Ordered Stop   04/17/21 2230  nirmatrelvir/ritonavir EUA (PAXLOVID) 3 tablet  Status:  Discontinued        3 tablet Oral 2 times daily 04/17/21 2218 04/17/21 2225        Time Spent in minutes  30   Lala Lund M.D on 04/21/2021 at 11:52 AM  To page go to [email or web address]   [facility] -  Office  [phone]    See all Orders from today for further details    Objective:   Vitals:   04/20/21 2016 04/21/21 0410 04/21/21 0739 04/21/21 1129  BP: (!) 175/65 (!) 190/76 (!) 174/69 (!) 180/76  Pulse: 62 (!) 58 (!) 59 62  Resp: '18 18 16 16  '$ Temp: (!) 97.4 F (36.3 C) 97.6 F (36.4 C) 98 F (36.7 C) 97.7 F (36.5 C)  TempSrc: Oral     SpO2: 100% 99% 98% 98%  Weight:      Height:        Wt Readings from Last 3 Encounters:  04/17/21 90.7 kg  10/18/19 90.7 kg  11/23/18 99.2 kg     Intake/Output Summary (Last 24 hours) at 04/21/2021 1152 Last data filed at 04/21/2021 0740 Gross per 24 hour  Intake --  Output 2375 ml  Net -2375 ml     Physical Exam  Awake Alert, No new F.N deficits, Normal affect [place].AT,PERRAL Supple Neck,No JVD, No cervical lymphadenopathy appriciated.  Symmetrical Chest wall movement, Good air movement bilaterally, CTAB RRR,No Gallops,  loud aortic systolic murmur, +ve B.Sounds, Abd Soft, No tenderness, No organomegaly appriciated, No rebound - guarding or rigidity. No Cyanosis, Clubbing or edema, No new Rash or bruise     Data Review:    CBC Recent Labs  Lab 04/17/21 1645 04/18/21 0730 04/19/21 0326  04/20/21 0604 04/21/21 0638  WBC 7.2 7.8 8.3 7.3 8.0  HGB 14.2 14.8 14.9 14.1 14.2  HCT 39.2 40.9 42.4 39.6 40.9  PLT 200 191 208 178 181  MCV 82.4 82.5 82.3 83.7 83.8  MCH 29.8 29.8 28.9 29.8 29.1  MCHC 36.2* 36.2* 35.1 35.6 34.7  RDW 13.1 13.2 13.3 13.3 13.2  LYMPHSABS  --   --  2.7 2.3 2.6  MONOABS  --   --  0.5 0.5 0.5  EOSABS  --   --  0.2 0.2 0.2  BASOSABS  --   --  0.0 0.0 0.0    Recent Labs  Lab 04/17/21 1645 04/18/21 0730 04/19/21 0326 04/20/21 0604 04/21/21 0638  NA 136 138 140 135 139  K 3.3* 3.2* 3.1* 3.9 3.7  CL 102 101 105 100 104  CO2 '27 28 28 26 29  '$ GLUCOSE 299* 219* 171* 302* 184*  BUN '10 9 9 13 '$ 14  CREATININE 0.80 0.68 0.87 0.86 0.72  CALCIUM 8.9 8.9 8.8* 8.8* 9.1  AST  --  '16 15 17 '$ 14*  ALT  --  '13 11 12 13  '$ ALKPHOS  --  75 75 70 68  BILITOT  --  1.0 1.0 0.9 0.7  ALBUMIN  --  3.7 3.4* 3.1* 3.2*  MG  --  2.2 2.2 2.0 2.1  CRP  --  0.5 0.6 0.6  --   DDIMER  --  0.50 0.57* 0.49 0.46  TSH  --  1.137  --   --   --   HGBA1C  --  9.5*  --   --   --   BNP  --  116.6* 60.1 19.1 45.0    ------------------------------------------------------------------------------------------------------------------ Recent Labs    04/19/21 0326  CHOL 162  HDL 36*  LDLCALC 103*  TRIG 116  CHOLHDL 4.5    Lab Results  Component Value Date   HGBA1C 9.5 (H) 04/18/2021   ------------------------------------------------------------------------------------------------------------------ No results for input(s): TSH, T4TOTAL, T3FREE, THYROIDAB in the last 72 hours.  Invalid input(s): FREET3   Cardiac Enzymes No results for input(s): CKMB, TROPONINI, MYOGLOBIN in the last 168 hours.  Invalid input(s): CK ------------------------------------------------------------------------------------------------------------------    Component Value Date/Time   BNP 45.0 04/21/2021 [id number]      Radiology Reports CT ANGIO HEAD NECK W WO CM  Result Date: 04/18/2021 CLINICAL  DATA:  Small acute strokes on MRI brain EXAM: CT ANGIOGRAPHY HEAD AND NECK TECHNIQUE: Multidetector CT imaging of the head and neck was performed using the standard protocol during bolus administration of intravenous contrast. Multiplanar CT image reconstructions and MIPs were obtained to evaluate the vascular anatomy. Carotid stenosis measurements (when applicable) are obtained utilizing NASCET criteria, using the distal internal carotid diameter as the denominator. CONTRAST:  23m OMNIPAQUE IOHEXOL 350 MG/ML SOLN COMPARISON:  Recent CT and MR imaging FINDINGS: CT HEAD Brain: Small infarcts on the MRI brain are not well seen. There is no acute intracranial hemorrhage. No new loss of gray-white differentiation. Stable findings of chronic microvascular ischemic changes and parenchymal volume loss. Few chronic small vessel infarcts including involvement of the right cerebellum and left thalamus. Ventricles and sulci are stable in size and configuration. Vascular: Better evaluated on CTA portion. Skull: Calvarium is unremarkable. Sinuses/Orbits: No acute finding. Other: None. Review of the MIP images confirms the above findings CTA NECK Aortic arch: Calcified plaque along the thoracic aorta. Great vessel origins are patent. Right carotid system: Patent. Calcified plaque along the distal common carotid with minimal stenosis. Calcified plaque at the bifurcation and along the proximal internal carotid. There is less than 50% stenosis. Left carotid system: Patent. Mixed plaque along the common carotid with minimal stenosis. Calcified plaque along the proximal internal carotid with less than 50% stenosis. Vertebral arteries: Patent. Right vertebral artery is slightly dominant. There is focal moderate to marked stenosis of the left vertebral artery at the C4-C5 level. Skeleton: Cervical spine degenerative changes. Prominent osteophyte extends into the right ventral and lateral spinal canal at C5-C6. Other neck: None. Upper  chest: Branching opacity at the left lung apex likely reflects impacted airways. Review of the MIP images confirms the above findings CTA HEAD Anterior circulation: Intracranial internal carotid arteries are patent with calcified plaque causing mild stenosis. Anterior and middle cerebral arteries are patent. Plaque causes mild stenosis of the left M1 MCA. There is mild to moderate stenosis of the distal left A2 ACA. Posterior circulation: Intracranial vertebral arteries are patent with calcified  plaque bilaterally causing mild stenosis. Basilar artery is patent. Major cerebellar artery origins are patent. Posterior cerebral arteries are patent. Venous sinuses: Patent as allowed by contrast bolus timing. Review of the MIP images confirms the above findings IMPRESSION: No acute intracranial abnormality. Small infarcts on brain MRI are not seen. No large vessel occlusion. Plaque along the proximal internal carotids causes less than 50% stenosis. Mild intracranial atherosclerosis. Electronically Signed   By: Macy Mis M.D.   On: 04/18/2021 14:42   CT HEAD WO CONTRAST (5MM)  Result Date: 04/17/2021 CLINICAL DATA:  Neuro deficit, acute, stroke suspected. Dizziness. Gait disturbance. EXAM: CT HEAD WITHOUT CONTRAST TECHNIQUE: Contiguous axial images were obtained from the base of the skull through the vertex without intravenous contrast. COMPARISON:  01/08/2020 FINDINGS: Brain: Advanced generalized brain volume loss. Advanced chronic small-vessel ischemic changes affecting the pons, thalami and cerebral hemispheric white matter. No discernible change since last year. No large vessel territory stroke. No mass, hemorrhage, hydrocephalus or extra-axial collection. Vascular: There is atherosclerotic calcification of the major vessels at the base of the brain. Skull: Negative Sinuses/Orbits: Clear/normal Other: None IMPRESSION: No acute finding or change since last year. Generalized brain volume loss with extensive  chronic small-vessel ischemic changes throughout as described. Electronically Signed   By: Nelson Chimes M.D.   On: 04/17/2021 20:10   MR BRAIN WO CONTRAST  Result Date: 04/17/2021 CLINICAL DATA:  Dizziness EXAM: MRI HEAD WITHOUT CONTRAST TECHNIQUE: Multiplanar, multiecho pulse sequences of the brain and surrounding structures were obtained without intravenous contrast. COMPARISON:  10/18/2019 FINDINGS: Brain: Small focus of abnormal diffusion restriction within the left medullary pyramid. There is also a small focus of ischemia in the superior right frontal lobe. Numerous chronic microhemorrhages, predominantly central. Hyperintense T2-weighted signal is moderately widespread throughout the white matter. Advanced atrophy for age. The midline structures are normal. Vascular: Major flow voids are preserved. Skull and upper cervical spine: Normal calvarium and skull base. Visualized upper cervical spine and soft tissues are normal. Sinuses/Orbits:No paranasal sinus fluid levels or advanced mucosal thickening. No mastoid or middle ear effusion. Normal orbits. IMPRESSION: 1. Small acute infarcts of the left medullary pyramid and superior right frontal lobe. No acute hemorrhage or mass effect. 2. Numerous chronic microhemorrhages, predominantly central. This likely indicates chronic hypertensive angiopathy. 3. Advanced atrophy and chronic small vessel ischemic changes. Electronically Signed   By: Ulyses Jarred M.D.   On: 04/17/2021 22:03   US Carotid Bilateral  Result Date: 04/18/2021 CLINICAL DATA:  Near syncopal event EXAM: BILATERAL CAROTID DUPLEX ULTRASOUND TECHNIQUE: Pearline Cables scale imaging, color Doppler and duplex ultrasound were performed of bilateral carotid and vertebral arteries in the neck. COMPARISON:  10/19/2019 FINDINGS: Criteria: Quantification of carotid stenosis is based on velocity parameters that correlate the residual internal carotid diameter with NASCET-based stenosis levels, using the diameter  of the distal internal carotid lumen as the denominator for stenosis measurement. The following velocity measurements were obtained: RIGHT ICA: 78/10 cm/sec CCA:  [id number] cm/sec SYSTOLIC ICA/CCA RATIO:  1.7 ECA:  63 cm/sec LEFT ICA:  74/9 cm/sec CCA:  [id number] cm/sec SYSTOLIC ICA/CCA RATIO:  1.4 ECA:  66 cm/sec RIGHT CAROTID ARTERY: Preliminary grayscale images demonstrate scattered atherosclerotic plaque in the common and internal carotid arteries. The waveforms, velocities and flow velocity ratios however demonstrate no evidence of focal hemodynamically significant stenosis. RIGHT VERTEBRAL ARTERY:  Antegrade in nature. LEFT CAROTID ARTERY: Preliminary grayscale images of the left carotid system demonstrates scattered atherosclerotic plaque in the common and internal carotid artery. The  waveforms, velocities and flow velocity ratios show no evidence of focal hemodynamically significant stenosis. LEFT VERTEBRAL ARTERY:  Antegrade in nature. IMPRESSION: Extensive bilateral atherosclerotic plaque similar to that seen on prior exams without focal hemodynamically significant stenosis. Electronically Signed   By: Inez Catalina M.D.   On: 04/18/2021 01:20   DG Chest Portable 1 View  Result Date: 04/17/2021 CLINICAL DATA:  COVID-19 positivity with dizziness, initial encounter EXAM: PORTABLE CHEST 1 VIEW COMPARISON:  10/08/2018 FINDINGS: Cardiac shadow is within normal limits. Aortic calcifications are seen. Lungs are clear bilaterally. Postsurgical changes in the thoracolumbar spine are noted. IMPRESSION: No active disease. Electronically Signed   By: Inez Catalina M.D.   On: 04/17/2021 21:41   DG Abd Portable 1V  Result Date: 04/19/2021 CLINICAL DATA:  Nausea and vomiting.  COVID-19 viral infection. EXAM: PORTABLE ABDOMEN - 1 VIEW COMPARISON:  None. FINDINGS: The bowel gas pattern is normal. No radio-opaque calculi or other significant radiographic abnormality are seen. Thoracolumbar spine fusion hardware noted.  IMPRESSION: Negative. Electronically Signed   By: Marlaine Hind M.D.   On: 04/19/2021 18:32   ECHOCARDIOGRAM COMPLETE  Result Date: 04/20/2021    ECHOCARDIOGRAM REPORT   Patient Name:   Victor Fox Date of Exam: 04/19/2021 Medical Rec #:  [id number]      Height:       72.0 in Accession #:    [id number]     Weight:       200.0 lb Date of Birth:  07-Sep-1945       BSA:          2.130 m Patient Age:    17 years       BP:           143/71 mmHg Patient Gender: M              HR:           70 bpm. Exam Location:  [facility] Procedure: 2D Echo, Cardiac Doppler and Color Doppler Indications:     Murmur R01.1  History:         Patient has no prior history of Echocardiogram examinations and                  Patient has prior history of Echocardiogram examinations. Risk                  Factors:Diabetes and Hypertension. CVA.  Sonographer:     Alyse Low Roar Referring Phys:  Moro Phys: Ida Rogue MD IMPRESSIONS  1. Left ventricular ejection fraction, by estimation, is 60 to 65%. The left ventricle has normal function. The left ventricle has no regional wall motion abnormalities. There is moderate left ventricular hypertrophy. Left ventricular diastolic parameters are consistent with Grade I diastolic dysfunction (impaired relaxation).  2. Right ventricular systolic function is normal. The right ventricular size is normal.  3. Left atrial size was mildly dilated.  4. The aortic valve is normal in structure. There is severe calcifcation of the aortic valve. Aortic valve regurgitation is mild. Moderate to severe aortic valve stenosis. Aortic valve area, by VTI measures 1.20 cm. Aortic valve mean gradient measures 28.5 mmHg. Aortic valve Vmax measures 3.41 m/s. FINDINGS  Left Ventricle: Left ventricular ejection fraction, by estimation, is 60 to 65%. The left ventricle has normal function. The left ventricle has no regional wall motion abnormalities. The left ventricular internal cavity size was normal  in size. There is  moderate left ventricular hypertrophy.  Left ventricular diastolic parameters are consistent with Grade I diastolic dysfunction (impaired relaxation). Right Ventricle: The right ventricular size is normal. No increase in right ventricular wall thickness. Right ventricular systolic function is normal. Left Atrium: Left atrial size was mildly dilated. Right Atrium: Right atrial size was normal in size. Pericardium: There is no evidence of pericardial effusion. Mitral Valve: The mitral valve is normal in structure. No evidence of mitral valve regurgitation. No evidence of mitral valve stenosis. Tricuspid Valve: The tricuspid valve is normal in structure. Tricuspid valve regurgitation is not demonstrated. No evidence of tricuspid stenosis. Aortic Valve: The aortic valve is normal in structure. There is severe calcifcation of the aortic valve. Aortic valve regurgitation is mild. Moderate to severe aortic stenosis is present. Aortic valve mean gradient measures 28.5 mmHg. Aortic valve peak gradient measures 46.5 mmHg. Aortic valve area, by VTI measures 1.20 cm. Pulmonic Valve: The pulmonic valve was not well visualized. Pulmonic valve regurgitation is not visualized. No evidence of pulmonic stenosis. Aorta: The aortic root is normal in size and structure. Venous: The pulmonary veins were not well visualized. The inferior vena cava is normal in size with greater than 50% respiratory variability, suggesting right atrial pressure of 3 mmHg. IAS/Shunts: No atrial level shunt detected by color flow Doppler.  LEFT VENTRICLE PLAX 2D LVIDd:         5.01 cm  Diastology LVIDs:         3.33 cm  LV e' medial:    4.35 cm/s LV PW:         1.23 cm  LV E/e' medial:  14.3 LV IVS:        1.41 cm  LV e' lateral:   4.79 cm/s LVOT diam:     2.20 cm  LV E/e' lateral: 13.0 LV SV:         82 LV SV Index:   39 LVOT Area:     3.80 cm  RIGHT VENTRICLE RV Basal diam:  3.64 cm RV Mid diam:    3.35 cm RV S prime:     17.20 cm/s  TAPSE (M-mode): 2.4 cm LEFT ATRIUM             Index       RIGHT ATRIUM           Index LA diam:        3.80 cm 1.78 cm/m  RA Area:     16.30 cm LA Vol (A2C):   76.1 ml 35.72 ml/m RA Volume:   39.00 ml  18.31 ml/m LA Vol (A4C):   65.7 ml 30.84 ml/m LA Biplane Vol: 72.0 ml 33.80 ml/m  AORTIC VALVE                    PULMONIC VALVE AV Area (Vmax):    1.17 cm     PV Vmax:        1.19 m/s AV Area (Vmean):   1.04 cm     PV Peak grad:   5.7 mmHg AV Area (VTI):     1.20 cm     RVOT Peak grad: 5 mmHg AV Vmax:           341.00 cm/s AV Vmean:          251.000 cm/s AV VTI:            0.687 m AV Peak Grad:      46.5 mmHg AV Mean Grad:      28.5 mmHg LVOT  Vmax:         105.00 cm/s LVOT Vmean:        68.700 cm/s LVOT VTI:          0.216 m LVOT/AV VTI ratio: 0.31  AORTA Ao Asc diam: 3.20 cm MITRAL VALVE MV Area (PHT): 2.55 cm     SHUNTS MV Decel Time: 298 msec     Systemic VTI:  0.22 m MV E velocity: 62.40 cm/s   Systemic Diam: 2.20 cm MV A velocity: 120.00 cm/s MV E/A ratio:  0.52 MV A Prime:    7.0 cm/s Ida Rogue MD Electronically signed by Ida Rogue MD Signature Date/Time: 04/20/2021/9:14:23 AM    Final

## 2021-04-22 DIAGNOSIS — I639 Cerebral infarction, unspecified: Secondary | ICD-10-CM | POA: Diagnosis not present

## 2021-04-22 DIAGNOSIS — R278 Other lack of coordination: Secondary | ICD-10-CM | POA: Diagnosis not present

## 2021-04-22 DIAGNOSIS — R262 Difficulty in walking, not elsewhere classified: Secondary | ICD-10-CM | POA: Diagnosis not present

## 2021-04-22 DIAGNOSIS — R55 Syncope and collapse: Secondary | ICD-10-CM | POA: Diagnosis not present

## 2021-04-22 LAB — COMPREHENSIVE METABOLIC PANEL
ALT: 13 U/L (ref 0–44)
AST: 13 U/L — ABNORMAL LOW (ref 15–41)
Albumin: 3.3 g/dL — ABNORMAL LOW (ref 3.5–5.0)
Alkaline Phosphatase: 68 U/L (ref 38–126)
Anion gap: 6 (ref 5–15)
BUN: 16 mg/dL (ref 8–23)
CO2: 31 mmol/L (ref 22–32)
Calcium: 9.2 mg/dL (ref 8.9–10.3)
Chloride: 103 mmol/L (ref 98–111)
Creatinine, Ser: 0.85 mg/dL (ref 0.61–1.24)
GFR, Estimated: >60 mL/min (ref >60)
Glucose, Bld: 177 mg/dL — ABNORMAL HIGH (ref 70–99)
Potassium: 4.1 mmol/L (ref 3.5–5.1)
Sodium: 140 mmol/L (ref 135–145)
Total Bilirubin: 0.7 mg/dL (ref 0.3–1.2)
Total Protein: 6.1 g/dL — ABNORMAL LOW (ref 6.5–8.1)

## 2021-04-22 LAB — CBC WITH DIFFERENTIAL/PLATELET
Abs Immature Granulocytes: 0.02 10*3/uL (ref 0.00–0.07)
Basophils Absolute: 0 10*3/uL (ref 0.0–0.1)
Basophils Relative: 1 %
Eosinophils Absolute: 0.3 10*3/uL (ref 0.0–0.5)
Eosinophils Relative: 3 %
HCT: 41.9 % (ref 39.0–52.0)
Hemoglobin: 14.8 g/dL (ref 13.0–17.0)
Immature Granulocytes: 0 %
Lymphocytes Relative: 37 %
Lymphs Abs: 2.9 10*3/uL (ref 0.7–4.0)
MCH: 29.5 pg (ref 26.0–34.0)
MCHC: 35.3 g/dL (ref 30.0–36.0)
MCV: 83.6 fL (ref 80.0–100.0)
Monocytes Absolute: 0.5 10*3/uL (ref 0.1–1.0)
Monocytes Relative: 6 %
Neutro Abs: 4.2 10*3/uL (ref 1.7–7.7)
Neutrophils Relative %: 53 %
Platelets: 194 10*3/uL (ref 150–400)
RBC: 5.01 MIL/uL (ref 4.22–5.81)
RDW: 13.2 % (ref 11.5–15.5)
WBC: 8 10*3/uL (ref 4.0–10.5)
nRBC: 0 % (ref 0.0–0.2)

## 2021-04-22 LAB — BRAIN NATRIURETIC PEPTIDE: B Natriuretic Peptide: 45.4 pg/mL (ref 0.0–100.0)

## 2021-04-22 LAB — D-DIMER, QUANTITATIVE: D-Dimer, Quant: <0.27 ug/mL-FEU (ref 0.00–0.50)

## 2021-04-22 LAB — GLUCOSE, CAPILLARY
Glucose-Capillary: 194 mg/dL — ABNORMAL HIGH (ref 70–99)
Glucose-Capillary: 241 mg/dL — ABNORMAL HIGH (ref 70–99)
Glucose-Capillary: 242 mg/dL — ABNORMAL HIGH (ref 70–99)
Glucose-Capillary: 133 mg/dL — ABNORMAL HIGH (ref 70–99)

## 2021-04-22 LAB — C-REACTIVE PROTEIN: CRP: <0.5 mg/dL (ref <1.0)

## 2021-04-22 LAB — MAGNESIUM: Magnesium: 2.3 mg/dL (ref 1.7–2.4)

## 2021-04-22 MED ORDER — ENALAPRIL MALEATE 5 MG PO TABS
5.0000 mg | ORAL_TABLET | Freq: Every day | ORAL | Status: DC
  Administered 2021-04-22 – 2021-04-25 (×4): 5 mg via ORAL
  Filled 2021-04-22 (×4): qty 1

## 2021-04-22 NOTE — Progress Notes (Signed)
Physical Therapy Treatment Patient Details Name: Victor Fox MRN: [id number] DOB: 05-25-1946 Today's Date: 04/22/2021    History of Present Illness 75 y.o. male with a past medical history of significant diabetes mellitus type 2, hypertension, hyperlipidemia, neuropathy, vertigo, carotid artery disease, coronary artery disease, history of CVA, former smoker, resented to the hospital with c/o unsteainess/falls starting 8/13, MRI shows ischemic infarct.    PT Comments    Pt received supine in bed, states PT woke him up from a nap. Pt agreeable to therapy and plan to end in chair to remain sitting up after therapy. Pt continues to require SUP for bed mobility and CGA for ambulation. Poor carryover of VC during gait as pt continues to require consistent VC to remain within RW. He ambulated laps within his room, showed a preference to turn right but was able to turn left safely when PT asked. Cadence significantly decreased when PT included dual tasking via conversation. He continues to demo decreased functional endurance, stability in standing a safety with out of bed mobility. Pt remained sitting up in chair, alarm activated and all needs met. Would benefit from skilled PT to address above deficits and promote optimal return to PLOF.    Follow Up Recommendations  SNF;Supervision for mobility/OOB     Equipment Recommendations  None recommended by PT (TBD in next venue)    Recommendations for Other Services       Precautions / Restrictions Precautions Precautions: Fall Restrictions Weight Bearing Restrictions: No    Mobility  Bed Mobility Overal bed mobility: Needs Assistance Bed Mobility: Supine to Sit     Supine to sit: Supervision;HOB elevated     General bed mobility comments: use of bed rails, additional time and effort to complete    Transfers Overall transfer level: Needs assistance Equipment used: Rolling walker (2 wheeled) Transfers: Sit to/from Stand Sit to Stand:  Min guard         General transfer comment: cuing for hand placement to push to standing  Ambulation/Gait Ambulation/Gait assistance: Min guard Gait Distance (Feet): 150 Feet Assistive device: Rolling walker (2 wheeled) Gait Pattern/deviations: Decreased stride length;Trunk flexed;Step-through pattern;Narrow base of support Gait velocity: decreased   General Gait Details: Walking laps within room. VC to remain within RW during straight ambulation and turns. Pt frequently returned to pushing it out in front. CGA to steady. Increased cadence with decreased stride length. Fair navigation of obstacles within room.   Stairs             Wheelchair Mobility    Modified Rankin (Stroke Patients Only)       Balance Overall balance assessment: Needs assistance Sitting-balance support: Feet supported;No upper extremity supported Sitting balance-Leahy Scale: Fair     Standing balance support: During functional activity;Bilateral upper extremity supported Standing balance-Leahy Scale: Poor Standing balance comment: UE support and RW with fxl mobility                            Cognition Arousal/Alertness: Awake/alert Behavior During Therapy: WFL for tasks assessed/performed Overall Cognitive Status: Impaired/Different from baseline                                 General Comments: Able to follow all single and multi-step commands.      Exercises      General Comments        Pertinent Vitals/Pain Pain  Assessment: No/denies pain    Home Living                      Prior Function            PT Goals (current goals can now be found in the care plan section) Acute Rehab PT Goals Patient Stated Goal: get stronger    Frequency    Min 2X/week      PT Plan      Co-evaluation              AM-PAC PT "6 Clicks" Mobility   Outcome Measure  Help needed turning from your back to your side while in a flat bed without  using bedrails?: A Little Help needed moving from lying on your back to sitting on the side of a flat bed without using bedrails?: A Little Help needed moving to and from a bed to a chair (including a wheelchair)?: A Little Help needed standing up from a chair using your arms (e.g., wheelchair or bedside chair)?: A Little Help needed to walk in hospital room?: A Little Help needed climbing 3-5 steps with a railing? : A Lot 6 Click Score: 17    End of Session Equipment Utilized During Treatment: Gait belt Activity Tolerance: Patient tolerated treatment well Patient left: in chair;with chair alarm set;with call bell/phone within reach Nurse Communication: Mobility status PT Visit Diagnosis: Muscle weakness (generalized) (M62.81);Difficulty in walking, not elsewhere classified (R26.2);Unsteadiness on feet (R26.81)     Time: 2446-2863 PT Time Calculation (min) (ACUTE ONLY): 28 min  Charges:  $Gait Training: 8-22 mins $Therapeutic Activity: 8-22 mins                     Patrina Levering PT, DPT 04/22/21 4:22 PM [phone]

## 2021-04-22 NOTE — TOC Progression Note (Signed)
Transition of Care [facility]) - Progression Note    Patient Details  Name: Victor Fox MRN: [id number] Date of Birth: August 28, 1946  Transition of Care [facility]) CM/SW [place], RN Phone Number: 04/22/2021, 2:16 PM  Clinical Narrative:   Bed search started, patient COVID +, need 10 day wait prior to SNF. Message left for APS caseworker re: SNF recommendation.  TOC to follow to discharge.         Expected Discharge Plan and Services                                                 Social Determinants of Health (SDOH) Interventions    Readmission Risk Interventions No flowsheet data found.

## 2021-04-22 NOTE — Progress Notes (Signed)
PROGRESS NOTE                                                                                                                                                                                                             Patient Demographics:    Victor Fox, is a 75 y.o. male, DOB - 21-May-1946, NX:[id number]  Outpatient Primary MD for the patient is Patient, No Pcp Per (Inactive)    LOS - 4  Admit date - 04/17/2021    Chief Complaint  Patient presents with   Dizziness       Brief Narrative (HPI from H&P)   Victor Fox is a 75 y.o. male with a past medical history of significant diabetes mellitus type 2, hypertension, hyperlipidemia, neuropathy, vertigo, carotid artery disease, coronary artery disease, history of CVA, former smoker, resented to the hospital with chief complaints of dizziness upon sitting up and standing, in the ER initial work-up negative follow-up MRI shows ischemic infarcts along with loud systolic murmur.  He was admitted for further care.   Subjective:   Patient in bed, appears comfortable, denies any headache, no fever, no chest pain or pressure, no shortness of breath , no abdominal pain. No focal weakness.  Improving dizziness.   Assessment  & Plan :     Acute on chronic dizziness mostly postural  with positive orthostatics - he does have acute CVA however he is nearly orthostatic with a very loud aortic systolic murmur.  Some chronic dizziness as well and this could be multifactorial, for now we will stabilize his blood pressure, check echocardiogram to evaluate the heart murmur, will also request cardiology to evaluate, stroke work-up as below.  2.  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  Could be small vessel but question if this could be embolic, monitor on telemetry, EKG nonacute with artifact per cardiology, no previous history of dysrhythmia, neurology and cardiology  following, currently on aspirin and Plavix for 3 weeks thereafter aspirin only, statin dose adjusted for better LDL control, A1c was in poor control and diabetic education will be provided, echocardiogram non acute, CT head noted which is nonacute, upon discharge likely will require Cards/EP follow-up for ZIO patch.  PT OT and speech following him, he lives alone and will need SNF.    3.  Incidental COVID infection.  Inflammatory  markers stable.  Could have contributed to CVA, for now aspirin Plavix continue and monitor clinically.  No pulmonary symptoms   4.  Hypertension with orthostatic hypotension.  Due to stroke gradually lowering blood pressure over the next few days currently on Norvasc low-dose Vasotec added on 04/22/2021.  5. ?  A flutter versus artifact on initial EKG in the ER, subsequent EKG within half an hour was sinus rhythm.  Per cardiology artifact, TSH stable, see #2 above.  6.  Moderate to severe AS.  Outpatient cardiology follow-up. Avoid big drops in blood pressure and preload.  7. Dyslipidemia.  On statin a.m. lipid panel ordered.  8. Underlying history of CAD.  Chest pain-free.  Currently on aspirin Plavix and statin.  Continue.    9.  Chronic diastolic dysfunction EF [id number].  Compensated.    10. DM type II.  On Lantus and sliding scale.  A1c suggests poor outpatient control due to hyperglycemia.  Diabetic education  Lab Results  Component Value Date   HGBA1C 9.5 (H) 04/18/2021   CBG (last 3)  Recent Labs    04/21/21 1126 04/21/21 1752 04/22/21 0914  GLUCAP 173* 175* 194*          Condition -  Guarded  Family Communication  :  None present  Code Status :  Full  Consults  :  Cards, Neuro  PUD Prophylaxis :    Procedures  :     MRI -  1. Small acute infarcts of the left medullary pyramid and superior right frontal lobe. No acute hemorrhage or mass effect. 2. Numerous chronic microhemorrhages, predominantly central. This likely indicates chronic  hypertensive angiopathy. 3. Advanced atrophy and chronic small vessel ischemic changes.  Carotid US - Extensive bilateral atherosclerotic plaque similar to that seen on prior exams without focal hemodynamically significant stenosis.  CTA - No acute intracranial abnormality. Small infarcts on brain MRI are not seen. No large vessel occlusion. Plaque along the proximal internal carotids causes less than 50% stenosis. Mild intracranial atherosclerosis.  TTE -  1. Left ventricular ejection fraction, by estimation, is 60 to 65%. The left ventricle has normal function. The left ventricle has no regional wall motion abnormalities. There is moderate left ventricular hypertrophy. Left ventricular diastolic parameters are consistent with Grade I diastolic dysfunction (impaired relaxation).  2. Right ventricular systolic function is normal. The right ventricular size is normal.  3. Left atrial size was mildly dilated.  4. The aortic valve is normal in structure. There is severe calcifcation of the aortic valve. Aortic valve regurgitation is mild. Moderate to severe aortic valve stenosis. Aortic valve area, by VTI measures 1.20 cm. Aortic valve mean gradient measures 28.5 mmHg. Aortic valve Vmax measures 3.41 m/s.      Disposition Plan  :    Status is: Inpatient  Remains inpatient appropriate because:IV treatments appropriate due to intensity of illness or inability to take PO  Dispo: The patient is from: Home              Anticipated d/c is to: SNF              Patient currently is medically stable to d/c.   Difficult to place patient No  DVT Prophylaxis  :     enoxaparin (LOVENOX) injection 40 mg Start: 04/17/21 2200   Lab Results  Component Value Date   PLT 194 04/22/2021    Diet :  Diet Order  DIET SOFT Room service appropriate? Yes; Fluid consistency: Thin  Diet effective now                    Inpatient Medications  Scheduled Meds:  amLODipine  10 mg Oral Daily    aspirin EC  81 mg Oral Daily   atorvastatin  80 mg Oral Daily   clopidogrel  75 mg Oral Daily   enoxaparin (LOVENOX) injection  40 mg Subcutaneous Q24H   insulin aspart  0-15 Units Subcutaneous TID WC   insulin aspart protamine- aspart  12 Units Subcutaneous BID WC   Continuous Infusions: PRN Meds:.albuterol, hydrALAZINE, meclizine, ondansetron (ZOFRAN) IV  Antibiotics  :    Anti-infectives (From admission, onward)    Start     Dose/Rate Route Frequency Ordered Stop   04/17/21 2230  nirmatrelvir/ritonavir EUA (PAXLOVID) 3 tablet  Status:  Discontinued        3 tablet Oral 2 times daily 04/17/21 2218 04/17/21 2225        Time Spent in minutes  30   Lala Lund M.D on 04/22/2021 at 10:34 AM  To page go to [email or web address]   [facility] -  Office  [phone]    See all Orders from today for further details    Objective:   Vitals:   04/21/21 2030 04/22/21 0031 04/22/21 0435 04/22/21 0807  BP: (!) 148/62 134/68 (!) 142/66 (!) 172/79  Pulse: 62 60 (!) 58 60  Resp: '17 18 18 16  '$ Temp: 98.2 F (36.8 C) 98 F (36.7 C) 97.8 F (36.6 C) 97.6 F (36.4 C)  TempSrc: Oral Oral Oral   SpO2: 97% 98% 98% 98%  Weight:      Height:        Wt Readings from Last 3 Encounters:  04/17/21 90.7 kg  10/18/19 90.7 kg  11/23/18 99.2 kg     Intake/Output Summary (Last 24 hours) at 04/22/2021 1034 Last data filed at 04/22/2021 0500 Gross per 24 hour  Intake 594 ml  Output 2200 ml  Net -1606 ml     Physical Exam  Awake Alert, No new F.N deficits, Normal affect [place].AT,PERRAL Supple Neck,No JVD, No cervical lymphadenopathy appriciated.  Symmetrical Chest wall movement, Good air movement bilaterally, CTAB RRR,No Gallops, loud aortic systolic murmur +ve B.Sounds, Abd Soft, No tenderness, No organomegaly appriciated, No rebound - guarding or rigidity. No Cyanosis, Clubbing or edema, No new Rash or bruise    Data Review:    CBC Recent Labs  Lab 04/18/21 0730  04/19/21 0326 04/20/21 0604 04/21/21 0638 04/22/21 0446  WBC 7.8 8.3 7.3 8.0 8.0  HGB 14.8 14.9 14.1 14.2 14.8  HCT 40.9 42.4 39.6 40.9 41.9  PLT 191 208 178 181 194  MCV 82.5 82.3 83.7 83.8 83.6  MCH 29.8 28.9 29.8 29.1 29.5  MCHC 36.2* 35.1 35.6 34.7 35.3  RDW 13.2 13.3 13.3 13.2 13.2  LYMPHSABS  --  2.7 2.3 2.6 2.9  MONOABS  --  0.5 0.5 0.5 0.5  EOSABS  --  0.2 0.2 0.2 0.3  BASOSABS  --  0.0 0.0 0.0 0.0    Recent Labs  Lab 04/18/21 0730 04/19/21 0326 04/20/21 0604 04/21/21 0638 04/22/21 0446  NA 138 140 135 139 140  K 3.2* 3.1* 3.9 3.7 4.1  CL 101 105 100 104 103  CO2 '28 28 26 29 31  '$ GLUCOSE 219* 171* 302* 184* 177*  BUN '9 9 13 14 16  '$ CREATININE 0.68 0.87 0.86 0.72 0.85  CALCIUM 8.9 8.8* 8.8* 9.1 9.2  AST '16 15 17 '$ 14* 13*  ALT '13 11 12 13 13  '$ ALKPHOS 75 75 70 68 68  BILITOT 1.0 1.0 0.9 0.7 0.7  ALBUMIN 3.7 3.4* 3.1* 3.2* 3.3*  MG 2.2 2.2 2.0 2.1 2.3  CRP 0.5 0.6 0.6 0.6 <0.5  DDIMER 0.50 0.57* 0.49 0.46 <0.27  TSH 1.137  --   --   --   --   HGBA1C 9.5*  --   --   --   --   BNP 116.6* 60.1 19.1 45.0 45.4    ------------------------------------------------------------------------------------------------------------------ No results for input(s): CHOL, HDL, LDLCALC, TRIG, CHOLHDL, LDLDIRECT in the last 72 hours.   Lab Results  Component Value Date   HGBA1C 9.5 (H) 04/18/2021   ------------------------------------------------------------------------------------------------------------------ No results for input(s): TSH, T4TOTAL, T3FREE, THYROIDAB in the last 72 hours.  Invalid input(s): FREET3   Cardiac Enzymes No results for input(s): CKMB, TROPONINI, MYOGLOBIN in the last 168 hours.  Invalid input(s): CK ------------------------------------------------------------------------------------------------------------------    Component Value Date/Time   BNP 45.4 04/22/2021 0446      Radiology Reports CT ANGIO HEAD NECK W WO CM  Result Date:  04/18/2021 CLINICAL DATA:  Small acute strokes on MRI brain EXAM: CT ANGIOGRAPHY HEAD AND NECK TECHNIQUE: Multidetector CT imaging of the head and neck was performed using the standard protocol during bolus administration of intravenous contrast. Multiplanar CT image reconstructions and MIPs were obtained to evaluate the vascular anatomy. Carotid stenosis measurements (when applicable) are obtained utilizing NASCET criteria, using the distal internal carotid diameter as the denominator. CONTRAST:  58m OMNIPAQUE IOHEXOL 350 MG/ML SOLN COMPARISON:  Recent CT and MR imaging FINDINGS: CT HEAD Brain: Small infarcts on the MRI brain are not well seen. There is no acute intracranial hemorrhage. No new loss of gray-white differentiation. Stable findings of chronic microvascular ischemic changes and parenchymal volume loss. Few chronic small vessel infarcts including involvement of the right cerebellum and left thalamus. Ventricles and sulci are stable in size and configuration. Vascular: Better evaluated on CTA portion. Skull: Calvarium is unremarkable. Sinuses/Orbits: No acute finding. Other: None. Review of the MIP images confirms the above findings CTA NECK Aortic arch: Calcified plaque along the thoracic aorta. Great vessel origins are patent. Right carotid system: Patent. Calcified plaque along the distal common carotid with minimal stenosis. Calcified plaque at the bifurcation and along the proximal internal carotid. There is less than 50% stenosis. Left carotid system: Patent. Mixed plaque along the common carotid with minimal stenosis. Calcified plaque along the proximal internal carotid with less than 50% stenosis. Vertebral arteries: Patent. Right vertebral artery is slightly dominant. There is focal moderate to marked stenosis of the left vertebral artery at the C4-C5 level. Skeleton: Cervical spine degenerative changes. Prominent osteophyte extends into the right ventral and lateral spinal canal at C5-C6. Other  neck: None. Upper chest: Branching opacity at the left lung apex likely reflects impacted airways. Review of the MIP images confirms the above findings CTA HEAD Anterior circulation: Intracranial internal carotid arteries are patent with calcified plaque causing mild stenosis. Anterior and middle cerebral arteries are patent. Plaque causes mild stenosis of the left M1 MCA. There is mild to moderate stenosis of the distal left A2 ACA. Posterior circulation: Intracranial vertebral arteries are patent with calcified plaque bilaterally causing mild stenosis. Basilar artery is patent. Major cerebellar artery origins are patent. Posterior cerebral arteries are patent. Venous sinuses: Patent as allowed by contrast bolus timing. Review of the MIP images confirms  the above findings IMPRESSION: No acute intracranial abnormality. Small infarcts on brain MRI are not seen. No large vessel occlusion. Plaque along the proximal internal carotids causes less than 50% stenosis. Mild intracranial atherosclerosis. Electronically Signed   By: Macy Mis M.D.   On: 04/18/2021 14:42   CT HEAD WO CONTRAST (5MM)  Result Date: 04/17/2021 CLINICAL DATA:  Neuro deficit, acute, stroke suspected. Dizziness. Gait disturbance. EXAM: CT HEAD WITHOUT CONTRAST TECHNIQUE: Contiguous axial images were obtained from the base of the skull through the vertex without intravenous contrast. COMPARISON:  01/08/2020 FINDINGS: Brain: Advanced generalized brain volume loss. Advanced chronic small-vessel ischemic changes affecting the pons, thalami and cerebral hemispheric white matter. No discernible change since last year. No large vessel territory stroke. No mass, hemorrhage, hydrocephalus or extra-axial collection. Vascular: There is atherosclerotic calcification of the major vessels at the base of the brain. Skull: Negative Sinuses/Orbits: Clear/normal Other: None IMPRESSION: No acute finding or change since last year. Generalized brain volume loss  with extensive chronic small-vessel ischemic changes throughout as described. Electronically Signed   By: Nelson Chimes M.D.   On: 04/17/2021 20:10   MR BRAIN WO CONTRAST  Result Date: 04/17/2021 CLINICAL DATA:  Dizziness EXAM: MRI HEAD WITHOUT CONTRAST TECHNIQUE: Multiplanar, multiecho pulse sequences of the brain and surrounding structures were obtained without intravenous contrast. COMPARISON:  10/18/2019 FINDINGS: Brain: Small focus of abnormal diffusion restriction within the left medullary pyramid. There is also a small focus of ischemia in the superior right frontal lobe. Numerous chronic microhemorrhages, predominantly central. Hyperintense T2-weighted signal is moderately widespread throughout the white matter. Advanced atrophy for age. The midline structures are normal. Vascular: Major flow voids are preserved. Skull and upper cervical spine: Normal calvarium and skull base. Visualized upper cervical spine and soft tissues are normal. Sinuses/Orbits:No paranasal sinus fluid levels or advanced mucosal thickening. No mastoid or middle ear effusion. Normal orbits. IMPRESSION: 1. Small acute infarcts of the left medullary pyramid and superior right frontal lobe. No acute hemorrhage or mass effect. 2. Numerous chronic microhemorrhages, predominantly central. This likely indicates chronic hypertensive angiopathy. 3. Advanced atrophy and chronic small vessel ischemic changes. Electronically Signed   By: Ulyses Jarred M.D.   On: 04/17/2021 22:03   US Carotid Bilateral  Result Date: 04/18/2021 CLINICAL DATA:  Near syncopal event EXAM: BILATERAL CAROTID DUPLEX ULTRASOUND TECHNIQUE: Pearline Cables scale imaging, color Doppler and duplex ultrasound were performed of bilateral carotid and vertebral arteries in the neck. COMPARISON:  10/19/2019 FINDINGS: Criteria: Quantification of carotid stenosis is based on velocity parameters that correlate the residual internal carotid diameter with NASCET-based stenosis levels, using  the diameter of the distal internal carotid lumen as the denominator for stenosis measurement. The following velocity measurements were obtained: RIGHT ICA: 78/10 cm/sec CCA:  [id number] cm/sec SYSTOLIC ICA/CCA RATIO:  1.7 ECA:  63 cm/sec LEFT ICA:  74/9 cm/sec CCA:  [id number] cm/sec SYSTOLIC ICA/CCA RATIO:  1.4 ECA:  66 cm/sec RIGHT CAROTID ARTERY: Preliminary grayscale images demonstrate scattered atherosclerotic plaque in the common and internal carotid arteries. The waveforms, velocities and flow velocity ratios however demonstrate no evidence of focal hemodynamically significant stenosis. RIGHT VERTEBRAL ARTERY:  Antegrade in nature. LEFT CAROTID ARTERY: Preliminary grayscale images of the left carotid system demonstrates scattered atherosclerotic plaque in the common and internal carotid artery. The waveforms, velocities and flow velocity ratios show no evidence of focal hemodynamically significant stenosis. LEFT VERTEBRAL ARTERY:  Antegrade in nature. IMPRESSION: Extensive bilateral atherosclerotic plaque similar to that seen on prior exams without focal  hemodynamically significant stenosis. Electronically Signed   By: Inez Catalina M.D.   On: 04/18/2021 01:20   DG Chest Portable 1 View  Result Date: 04/17/2021 CLINICAL DATA:  COVID-19 positivity with dizziness, initial encounter EXAM: PORTABLE CHEST 1 VIEW COMPARISON:  10/08/2018 FINDINGS: Cardiac shadow is within normal limits. Aortic calcifications are seen. Lungs are clear bilaterally. Postsurgical changes in the thoracolumbar spine are noted. IMPRESSION: No active disease. Electronically Signed   By: Inez Catalina M.D.   On: 04/17/2021 21:41   DG Abd Portable 1V  Result Date: 04/19/2021 CLINICAL DATA:  Nausea and vomiting.  COVID-19 viral infection. EXAM: PORTABLE ABDOMEN - 1 VIEW COMPARISON:  None. FINDINGS: The bowel gas pattern is normal. No radio-opaque calculi or other significant radiographic abnormality are seen. Thoracolumbar spine fusion hardware  noted. IMPRESSION: Negative. Electronically Signed   By: Marlaine Hind M.D.   On: 04/19/2021 18:32   ECHOCARDIOGRAM COMPLETE  Result Date: 04/20/2021    ECHOCARDIOGRAM REPORT   Patient Name:   Victor Fox Date of Exam: 04/19/2021 Medical Rec #:  [id number]      Height:       72.0 in Accession #:    [id number]     Weight:       200.0 lb Date of Birth:  [birth year]       BSA:          2.130 m Patient Age:    40 years       BP:           143/71 mmHg Patient Gender: M              HR:           70 bpm. Exam Location:  [facility] Procedure: 2D Echo, Cardiac Doppler and Color Doppler Indications:     Murmur R01.1  History:         Patient has no prior history of Echocardiogram examinations and                  Patient has prior history of Echocardiogram examinations. Risk                  Factors:Diabetes and Hypertension. CVA.  Sonographer:     Alyse Low Roar Referring Phys:  [place] Phys: Ida Rogue MD IMPRESSIONS  1. Left ventricular ejection fraction, by estimation, is 60 to 65%. The left ventricle has normal function. The left ventricle has no regional wall motion abnormalities. There is moderate left ventricular hypertrophy. Left ventricular diastolic parameters are consistent with Grade I diastolic dysfunction (impaired relaxation).  2. Right ventricular systolic function is normal. The right ventricular size is normal.  3. Left atrial size was mildly dilated.  4. The aortic valve is normal in structure. There is severe calcifcation of the aortic valve. Aortic valve regurgitation is mild. Moderate to severe aortic valve stenosis. Aortic valve area, by VTI measures 1.20 cm. Aortic valve mean gradient measures 28.5 mmHg. Aortic valve Vmax measures 3.41 m/s. FINDINGS  Left Ventricle: Left ventricular ejection fraction, by estimation, is 60 to 65%. The left ventricle has normal function. The left ventricle has no regional wall motion abnormalities. The left ventricular internal cavity size was  normal in size. There is  moderate left ventricular hypertrophy. Left ventricular diastolic parameters are consistent with Grade I diastolic dysfunction (impaired relaxation). Right Ventricle: The right ventricular size is normal. No increase in right ventricular wall thickness. Right ventricular systolic function is normal. Left  Atrium: Left atrial size was mildly dilated. Right Atrium: Right atrial size was normal in size. Pericardium: There is no evidence of pericardial effusion. Mitral Valve: The mitral valve is normal in structure. No evidence of mitral valve regurgitation. No evidence of mitral valve stenosis. Tricuspid Valve: The tricuspid valve is normal in structure. Tricuspid valve regurgitation is not demonstrated. No evidence of tricuspid stenosis. Aortic Valve: The aortic valve is normal in structure. There is severe calcifcation of the aortic valve. Aortic valve regurgitation is mild. Moderate to severe aortic stenosis is present. Aortic valve mean gradient measures 28.5 mmHg. Aortic valve peak gradient measures 46.5 mmHg. Aortic valve area, by VTI measures 1.20 cm. Pulmonic Valve: The pulmonic valve was not well visualized. Pulmonic valve regurgitation is not visualized. No evidence of pulmonic stenosis. Aorta: The aortic root is normal in size and structure. Venous: The pulmonary veins were not well visualized. The inferior vena cava is normal in size with greater than 50% respiratory variability, suggesting right atrial pressure of 3 mmHg. IAS/Shunts: No atrial level shunt detected by color flow Doppler.  LEFT VENTRICLE PLAX 2D LVIDd:         5.01 cm  Diastology LVIDs:         3.33 cm  LV e' medial:    4.35 cm/s LV PW:         1.23 cm  LV E/e' medial:  14.3 LV IVS:        1.41 cm  LV e' lateral:   4.79 cm/s LVOT diam:     2.20 cm  LV E/e' lateral: 13.0 LV SV:         82 LV SV Index:   39 LVOT Area:     3.80 cm  RIGHT VENTRICLE RV Basal diam:  3.64 cm RV Mid diam:    3.35 cm RV S prime:     17.20  cm/s TAPSE (M-mode): 2.4 cm LEFT ATRIUM             Index       RIGHT ATRIUM           Index LA diam:        3.80 cm 1.78 cm/m  RA Area:     16.30 cm LA Vol (A2C):   76.1 ml 35.72 ml/m RA Volume:   39.00 ml  18.31 ml/m LA Vol (A4C):   65.7 ml 30.84 ml/m LA Biplane Vol: 72.0 ml 33.80 ml/m  AORTIC VALVE                    PULMONIC VALVE AV Area (Vmax):    1.17 cm     PV Vmax:        1.19 m/s AV Area (Vmean):   1.04 cm     PV Peak grad:   5.7 mmHg AV Area (VTI):     1.20 cm     RVOT Peak grad: 5 mmHg AV Vmax:           341.00 cm/s AV Vmean:          251.000 cm/s AV VTI:            0.687 m AV Peak Grad:      46.5 mmHg AV Mean Grad:      28.5 mmHg LVOT Vmax:         105.00 cm/s LVOT Vmean:        68.700 cm/s LVOT VTI:          0.216 m  LVOT/AV VTI ratio: 0.31  AORTA Ao Asc diam: 3.20 cm MITRAL VALVE MV Area (PHT): 2.55 cm     SHUNTS MV Decel Time: 298 msec     Systemic VTI:  0.22 m MV E velocity: 62.40 cm/s   Systemic Diam: 2.20 cm MV A velocity: 120.00 cm/s MV E/A ratio:  0.52 MV A Prime:    7.0 cm/s Ida Rogue MD Electronically signed by Ida Rogue MD Signature Date/Time: 04/20/2021/9:14:23 AM    Final

## 2021-04-23 DIAGNOSIS — R262 Difficulty in walking, not elsewhere classified: Secondary | ICD-10-CM | POA: Diagnosis not present

## 2021-04-23 DIAGNOSIS — I639 Cerebral infarction, unspecified: Secondary | ICD-10-CM | POA: Diagnosis not present

## 2021-04-23 DIAGNOSIS — I350 Nonrheumatic aortic (valve) stenosis: Secondary | ICD-10-CM | POA: Diagnosis not present

## 2021-04-23 LAB — GLUCOSE, CAPILLARY
Glucose-Capillary: 131 mg/dL — ABNORMAL HIGH (ref 70–99)
Glucose-Capillary: 190 mg/dL — ABNORMAL HIGH (ref 70–99)
Glucose-Capillary: 219 mg/dL — ABNORMAL HIGH (ref 70–99)
Glucose-Capillary: 314 mg/dL — ABNORMAL HIGH (ref 70–99)

## 2021-04-23 NOTE — Progress Notes (Signed)
Occupational Therapy Treatment Patient Details Name: Victor Fox MRN: [id number] DOB: 07-16-1946 Today's Date: 04/23/2021    History of present illness 75 y.o. male with a past medical history of significant diabetes mellitus type 2, hypertension, hyperlipidemia, neuropathy, vertigo, carotid artery disease, coronary artery disease, history of CVA, former smoker, resented to the hospital with c/o unsteainess/falls starting 8/13, MRI shows ischemic infarct.   OT comments  [name] was seen for OT treatment on this date. Upon arrival to room pt reclined in bed c RN at bedside. Initial CGA + RW for ADL t/f decreasing to MIN A as pt fatigued - assist for RW mgmt. The SLUMS is a 30-point screening questionnaire that tests orientation, memory, attention, problem solving, and executive function. Pt scored an 8/30 indicating Dementia and the need for further assessment. Pt with noted impairments in all areas limiting ability to participate functionally in ADLs. Pt making good progress toward goals. Pt continues to benefit from skilled OT services to maximize return to PLOF and minimize risk of future falls, injury, caregiver burden, and readmission. Will continue to follow POC. Discharge recommendation remains appropriate.    Follow Up Recommendations  SNF    Equipment Recommendations  3 in 1 bedside commode    Recommendations for Other Services      Precautions / Restrictions Precautions Precautions: Fall Restrictions Weight Bearing Restrictions: No       Mobility Bed Mobility Overal bed mobility: Needs Assistance Bed Mobility: Supine to Sit     Supine to sit: Supervision;HOB elevated          Transfers Overall transfer level: Needs assistance Equipment used: Rolling walker (2 wheeled) Transfers: Sit to/from Stand Sit to Stand: Min guard         General transfer comment: requires x2 attempts to achieve standing    Balance Overall balance assessment: Needs  assistance Sitting-balance support: Feet supported;No upper extremity supported Sitting balance-Leahy Scale: Fair Sitting balance - Comments: supervision sitting EOB   Standing balance support: During functional activity;Bilateral upper extremity supported Standing balance-Leahy Scale: Poor                             ADL either performed or assessed with clinical judgement   ADL Overall ADL's : Needs assistance/impaired                                       General ADL Comments: Initial CGA + RW for ADL t/f decreasing to MIN A as pt fatigued - assist for RW mgmt.      Cognition Arousal/Alertness: Awake/alert Behavior During Therapy: WFL for tasks assessed/performed Overall Cognitive Status: Impaired/Different from baseline Area of Impairment: Orientation;Safety/judgement                 Orientation Level: Disoriented to;Place;Time       Safety/Judgement: Decreased awareness of safety;Decreased awareness of deficits     General Comments: SLUMS score 8/30        Exercises Exercises: Other exercises Other Exercises Other Exercises: Pt educated re; falls prevention, ECS Other Exercises: LBD,  sup>sit, sit<>stand, sitting/standing blanace/tolerance, ~48f mobility, SLUMS    Pertinent Vitals/ Pain       Pain Assessment: No/denies pain         Frequency  Min 1X/week        Progress Toward Goals  OT Goals(current goals can  now be found in the care plan section)  Progress towards OT goals: Progressing toward goals  Acute Rehab OT Goals Patient Stated Goal: get stronger OT Goal Formulation: With patient Time For Goal Achievement: 05/02/21 Potential to Achieve Goals: Good ADL Goals Pt Will Perform Grooming: with modified independence;standing Pt Will Transfer to Toilet: with modified independence;ambulating;regular height toilet Additional ADL Goal #1: Pt will Independently verbalize plan to implement x3 falls prevention  strategies  Plan Discharge plan remains appropriate;Frequency remains appropriate    Co-evaluation                 AM-PAC OT "6 Clicks" Daily Activity     Outcome Measure   Help from another person eating meals?: None Help from another person taking care of personal grooming?: A Little Help from another person toileting, which includes using toliet, bedpan, or urinal?: A Little Help from another person bathing (including washing, rinsing, drying)?: A Little Help from another person to put on and taking off regular upper body clothing?: A Little Help from another person to put on and taking off regular lower body clothing?: A Little 6 Click Score: 19    End of Session Equipment Utilized During Treatment: Rolling walker  OT Visit Diagnosis: Other abnormalities of gait and mobility (R26.89);Muscle weakness (generalized) (M62.81)   Activity Tolerance Patient tolerated treatment well   Patient Left in chair;with call bell/phone within reach;with chair alarm set (lap belt chair aalrm)   Nurse Communication Mobility status        Time: [id number] OT Time Calculation (min): 32 min  Charges: OT General Charges $OT Visit: 1 Visit OT Treatments $Self Care/Home Management : 23-37 mins  Dessie Coma, M.S. OTR/L  04/23/21, 1:31 PM  ascom [phone]

## 2021-04-23 NOTE — Progress Notes (Signed)
PROGRESS NOTE                                                                                                                                                                                                             Patient Demographics:    Victor Fox, is a 75 y.o. male, DOB - Feb 13, 1946, [id number]  Outpatient Primary MD for the patient is Patient, No Pcp Per (Inactive)    LOS - 5  Admit date - 04/17/2021    Chief Complaint  Patient presents with   Dizziness       Brief Narrative (HPI from H&P)   Victor Fox is a 75 y.o. male with a past medical history of significant diabetes mellitus type 2, hypertension, hyperlipidemia, neuropathy, vertigo, carotid artery disease, coronary artery disease, history of CVA, former smoker, resented to the hospital with chief complaints of dizziness upon sitting up and standing, in the ER initial work-up negative follow-up MRI shows ischemic infarcts along with loud systolic murmur.  He was admitted for further care.   [place] stable for SNF discharge, needs to finish 10-day quarantine on 04/27/2021.   Subjective:   Patient in bed, appears comfortable, denies any headache, no fever, no chest pain or pressure, no shortness of breath , no abdominal pain. No new focal weakness.   Assessment  & Plan :     Acute on chronic dizziness mostly postural  with positive orthostatics - he does have acute CVA however he is nearly orthostatic with a very loud aortic systolic murmur.  Some chronic dizziness as well and this could be multifactorial, for now we will stabilize his blood pressure, check echocardiogram to evaluate the heart murmur, will also request cardiology to evaluate, stroke work-up as below.  2.  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  Could be small vessel but question if this could be embolic, monitor on telemetry, EKG nonacute with artifact per  cardiology, no previous history of dysrhythmia, neurology and cardiology following, currently on aspirin and Plavix for 3 weeks thereafter aspirin only, statin dose adjusted for better LDL control, A1c was in poor control and diabetic education will be provided, echocardiogram non acute, CT head noted which is nonacute, upon discharge likely will require Cards/EP follow-up for ZIO patch.  PT OT and speech following him, he lives alone and will  need SNF.    3.  Incidental COVID infection.  Inflammatory markers stable.  Could have contributed to CVA, for now aspirin Plavix continue and monitor clinically.  No pulmonary symptoms   4.  Hypertension with orthostatic hypotension.  Due to stroke gradually lowering blood pressure over the next few days currently on Norvasc low-dose Vasotec added on 04/22/2021.  5. ?  A flutter versus artifact on initial EKG in the ER, subsequent EKG within half an hour was sinus rhythm.  Per cardiology artifact, TSH stable, see #2 above.  6.  Moderate to severe AS.  Outpatient cardiology follow-up. Avoid big drops in blood pressure and preload.  7. Dyslipidemia.  On statin a.m. lipid panel ordered.  8. Underlying history of CAD.  Chest pain-free.  Currently on aspirin Plavix and statin.  Continue.    9.  Chronic diastolic dysfunction EF [id number].  Compensated.    10. DM type II.  On Lantus and sliding scale.  A1c suggests poor outpatient control due to hyperglycemia.  Diabetic education  Lab Results  Component Value Date   HGBA1C 9.5 (H) 04/18/2021   CBG (last 3)  Recent Labs    04/22/21 1628 04/22/21 2143 04/23/21 0849  GLUCAP 242* 133* 131*          Condition -  Guarded  Family Communication  :  None present  Code Status :  Full  Consults  :  Cards, Neuro  PUD Prophylaxis :    Procedures  :     MRI -  1. Small acute infarcts of the left medullary pyramid and superior right frontal lobe. No acute hemorrhage or mass effect. 2. Numerous chronic  microhemorrhages, predominantly central. This likely indicates chronic hypertensive angiopathy. 3. Advanced atrophy and chronic small vessel ischemic changes.  Carotid US - Extensive bilateral atherosclerotic plaque similar to that seen on prior exams without focal hemodynamically significant stenosis.  CTA - No acute intracranial abnormality. Small infarcts on brain MRI are not seen. No large vessel occlusion. Plaque along the proximal internal carotids causes less than 50% stenosis. Mild intracranial atherosclerosis.  TTE -  1. Left ventricular ejection fraction, by estimation, is 60 to 65%. The left ventricle has normal function. The left ventricle has no regional wall motion abnormalities. There is moderate left ventricular hypertrophy. Left ventricular diastolic parameters are consistent with Grade I diastolic dysfunction (impaired relaxation).  2. Right ventricular systolic function is normal. The right ventricular size is normal.  3. Left atrial size was mildly dilated.  4. The aortic valve is normal in structure. There is severe calcifcation of the aortic valve. Aortic valve regurgitation is mild. Moderate to severe aortic valve stenosis. Aortic valve area, by VTI measures 1.20 cm. Aortic valve mean gradient measures 28.5 mmHg. Aortic valve Vmax measures 3.41 m/s.      Disposition Plan  :    Status is: Inpatient  Remains inpatient appropriate because:IV treatments appropriate due to intensity of illness or inability to take PO  Dispo: The patient is from: Home              Anticipated d/c is to: SNF              Patient currently is medically stable to d/c.  Finishes his COVID quarantine on 04/27/2021.   Difficult to place patient No  DVT Prophylaxis  :     enoxaparin (LOVENOX) injection 40 mg Start: 04/17/21 2200   Lab Results  Component Value Date   PLT 194 04/22/2021  Diet :  Diet Order             DIET SOFT Room service appropriate? Yes; Fluid consistency: Thin  Diet  effective now                    Inpatient Medications  Scheduled Meds:  amLODipine  10 mg Oral Daily   aspirin EC  81 mg Oral Daily   atorvastatin  80 mg Oral Daily   clopidogrel  75 mg Oral Daily   enalapril  5 mg Oral Daily   enoxaparin (LOVENOX) injection  40 mg Subcutaneous Q24H   insulin aspart  0-15 Units Subcutaneous TID WC   insulin aspart protamine- aspart  12 Units Subcutaneous BID WC   Continuous Infusions: PRN Meds:.albuterol, hydrALAZINE, meclizine, ondansetron (ZOFRAN) IV  Antibiotics  :    Anti-infectives (From admission, onward)    Start     Dose/Rate Route Frequency Ordered Stop   04/17/21 2230  nirmatrelvir/ritonavir EUA (PAXLOVID) 3 tablet  Status:  Discontinued        3 tablet Oral 2 times daily 04/17/21 2218 04/17/21 2225        Time Spent in minutes  30   Lala Lund M.D on 04/23/2021 at 11:01 AM  To page go to [email or web address]   [facility] -  Office  [phone]    See all Orders from today for further details    Objective:   Vitals:   04/22/21 1628 04/22/21 2105 04/23/21 0031 04/23/21 0555  BP: (!) 163/65 138/66 (!) 148/75 128/83  Pulse: 66 65 63 63  Resp: '18 16 16 16  '$ Temp: 98 F (36.7 C) 97.6 F (36.4 C) (!) 97.4 F (36.3 C) 97.7 F (36.5 C)  TempSrc: Oral Oral Oral   SpO2: 100% 99% 99% 97%  Weight:      Height:        Wt Readings from Last 3 Encounters:  04/17/21 90.7 kg  10/18/19 90.7 kg  11/23/18 99.2 kg     Intake/Output Summary (Last 24 hours) at 04/23/2021 1101 Last data filed at 04/23/2021 [id number] Gross per 24 hour  Intake 237 ml  Output 2075 ml  Net -1838 ml     Physical Exam  Awake Alert, No new F.N deficits, Normal affect [place].AT,PERRAL Supple Neck,No JVD, No cervical lymphadenopathy appriciated.  Symmetrical Chest wall movement, Good air movement bilaterally, CTAB RRR,No Gallops, positive loud aortic systolic murmur +ve B.Sounds, Abd Soft, No tenderness, No organomegaly appriciated, No  rebound - guarding or rigidity. No Cyanosis, Clubbing or edema, No new Rash or bruise     Data Review:    CBC Recent Labs  Lab 04/18/21 0730 04/19/21 0326 04/20/21 0604 04/21/21 0638 04/22/21 0446  WBC 7.8 8.3 7.3 8.0 8.0  HGB 14.8 14.9 14.1 14.2 14.8  HCT 40.9 42.4 39.6 40.9 41.9  PLT 191 208 178 181 194  MCV 82.5 82.3 83.7 83.8 83.6  MCH 29.8 28.9 29.8 29.1 29.5  MCHC 36.2* 35.1 35.6 34.7 35.3  RDW 13.2 13.3 13.3 13.2 13.2  LYMPHSABS  --  2.7 2.3 2.6 2.9  MONOABS  --  0.5 0.5 0.5 0.5  EOSABS  --  0.2 0.2 0.2 0.3  BASOSABS  --  0.0 0.0 0.0 0.0    Recent Labs  Lab 04/18/21 0730 04/19/21 0326 04/20/21 0604 04/21/21 0638 04/22/21 0446  NA 138 140 135 139 140  K 3.2* 3.1* 3.9 3.7 4.1  CL 101 105 100 104 103  CO2 28  $'28 26 29 31  'x$ GLUCOSE 219* 171* 302* 184* 177*  BUN '9 9 13 14 16  '$ CREATININE 0.68 0.87 0.86 0.72 0.85  CALCIUM 8.9 8.8* 8.8* 9.1 9.2  AST '16 15 17 '$ 14* 13*  ALT '13 11 12 13 13  '$ ALKPHOS 75 75 70 68 68  BILITOT 1.0 1.0 0.9 0.7 0.7  ALBUMIN 3.7 3.4* 3.1* 3.2* 3.3*  MG 2.2 2.2 2.0 2.1 2.3  CRP 0.5 0.6 0.6 0.6 <0.5  DDIMER 0.50 0.57* 0.49 0.46 <0.27  TSH 1.137  --   --   --   --   HGBA1C 9.5*  --   --   --   --   BNP 116.6* 60.1 19.1 45.0 45.4    ------------------------------------------------------------------------------------------------------------------ No results for input(s): CHOL, HDL, LDLCALC, TRIG, CHOLHDL, LDLDIRECT in the last 72 hours.   Lab Results  Component Value Date   HGBA1C 9.5 (H) 04/18/2021   ------------------------------------------------------------------------------------------------------------------ No results for input(s): TSH, T4TOTAL, T3FREE, THYROIDAB in the last 72 hours.  Invalid input(s): FREET3   Cardiac Enzymes No results for input(s): CKMB, TROPONINI, MYOGLOBIN in the last 168 hours.  Invalid input(s):  CK ------------------------------------------------------------------------------------------------------------------    Component Value Date/Time   BNP 45.4 04/22/2021 0446      Radiology Reports CT ANGIO HEAD NECK W WO CM  Result Date: 04/18/2021 CLINICAL DATA:  Small acute strokes on MRI brain EXAM: CT ANGIOGRAPHY HEAD AND NECK TECHNIQUE: Multidetector CT imaging of the head and neck was performed using the standard protocol during bolus administration of intravenous contrast. Multiplanar CT image reconstructions and MIPs were obtained to evaluate the vascular anatomy. Carotid stenosis measurements (when applicable) are obtained utilizing NASCET criteria, using the distal internal carotid diameter as the denominator. CONTRAST:  62m OMNIPAQUE IOHEXOL 350 MG/ML SOLN COMPARISON:  Recent CT and MR imaging FINDINGS: CT HEAD Brain: Small infarcts on the MRI brain are not well seen. There is no acute intracranial hemorrhage. No new loss of gray-white differentiation. Stable findings of chronic microvascular ischemic changes and parenchymal volume loss. Few chronic small vessel infarcts including involvement of the right cerebellum and left thalamus. Ventricles and sulci are stable in size and configuration. Vascular: Better evaluated on CTA portion. Skull: Calvarium is unremarkable. Sinuses/Orbits: No acute finding. Other: None. Review of the MIP images confirms the above findings CTA NECK Aortic arch: Calcified plaque along the thoracic aorta. Great vessel origins are patent. Right carotid system: Patent. Calcified plaque along the distal common carotid with minimal stenosis. Calcified plaque at the bifurcation and along the proximal internal carotid. There is less than 50% stenosis. Left carotid system: Patent. Mixed plaque along the common carotid with minimal stenosis. Calcified plaque along the proximal internal carotid with less than 50% stenosis. Vertebral arteries: Patent. Right vertebral artery is  slightly dominant. There is focal moderate to marked stenosis of the left vertebral artery at the C4-C5 level. Skeleton: Cervical spine degenerative changes. Prominent osteophyte extends into the right ventral and lateral spinal canal at C5-C6. Other neck: None. Upper chest: Branching opacity at the left lung apex likely reflects impacted airways. Review of the MIP images confirms the above findings CTA HEAD Anterior circulation: Intracranial internal carotid arteries are patent with calcified plaque causing mild stenosis. Anterior and middle cerebral arteries are patent. Plaque causes mild stenosis of the left M1 MCA. There is mild to moderate stenosis of the distal left A2 ACA. Posterior circulation: Intracranial vertebral arteries are patent with calcified plaque bilaterally causing mild stenosis. Basilar artery is patent.  Major cerebellar artery origins are patent. Posterior cerebral arteries are patent. Venous sinuses: Patent as allowed by contrast bolus timing. Review of the MIP images confirms the above findings IMPRESSION: No acute intracranial abnormality. Small infarcts on brain MRI are not seen. No large vessel occlusion. Plaque along the proximal internal carotids causes less than 50% stenosis. Mild intracranial atherosclerosis. Electronically Signed   By: Macy Mis M.D.   On: 04/18/2021 14:42   CT HEAD WO CONTRAST (5MM)  Result Date: 04/17/2021 CLINICAL DATA:  Neuro deficit, acute, stroke suspected. Dizziness. Gait disturbance. EXAM: CT HEAD WITHOUT CONTRAST TECHNIQUE: Contiguous axial images were obtained from the base of the skull through the vertex without intravenous contrast. COMPARISON:  01/08/2020 FINDINGS: Brain: Advanced generalized brain volume loss. Advanced chronic small-vessel ischemic changes affecting the pons, thalami and cerebral hemispheric white matter. No discernible change since last year. No large vessel territory stroke. No mass, hemorrhage, hydrocephalus or extra-axial  collection. Vascular: There is atherosclerotic calcification of the major vessels at the base of the brain. Skull: Negative Sinuses/Orbits: Clear/normal Other: None IMPRESSION: No acute finding or change since last year. Generalized brain volume loss with extensive chronic small-vessel ischemic changes throughout as described. Electronically Signed   By: Nelson Chimes M.D.   On: 04/17/2021 20:10   MR BRAIN WO CONTRAST  Result Date: 04/17/2021 CLINICAL DATA:  Dizziness EXAM: MRI HEAD WITHOUT CONTRAST TECHNIQUE: Multiplanar, multiecho pulse sequences of the brain and surrounding structures were obtained without intravenous contrast. COMPARISON:  10/18/2019 FINDINGS: Brain: Small focus of abnormal diffusion restriction within the left medullary pyramid. There is also a small focus of ischemia in the superior right frontal lobe. Numerous chronic microhemorrhages, predominantly central. Hyperintense T2-weighted signal is moderately widespread throughout the white matter. Advanced atrophy for age. The midline structures are normal. Vascular: Major flow voids are preserved. Skull and upper cervical spine: Normal calvarium and skull base. Visualized upper cervical spine and soft tissues are normal. Sinuses/Orbits:No paranasal sinus fluid levels or advanced mucosal thickening. No mastoid or middle ear effusion. Normal orbits. IMPRESSION: 1. Small acute infarcts of the left medullary pyramid and superior right frontal lobe. No acute hemorrhage or mass effect. 2. Numerous chronic microhemorrhages, predominantly central. This likely indicates chronic hypertensive angiopathy. 3. Advanced atrophy and chronic small vessel ischemic changes. Electronically Signed   By: Ulyses Jarred M.D.   On: 04/17/2021 22:03   US Carotid Bilateral  Result Date: 04/18/2021 CLINICAL DATA:  Near syncopal event EXAM: BILATERAL CAROTID DUPLEX ULTRASOUND TECHNIQUE: Pearline Cables scale imaging, color Doppler and duplex ultrasound were performed of  bilateral carotid and vertebral arteries in the neck. COMPARISON:  10/19/2019 FINDINGS: Criteria: Quantification of carotid stenosis is based on velocity parameters that correlate the residual internal carotid diameter with NASCET-based stenosis levels, using the diameter of the distal internal carotid lumen as the denominator for stenosis measurement. The following velocity measurements were obtained: RIGHT ICA: 78/10 cm/sec CCA:  [id number] cm/sec SYSTOLIC ICA/CCA RATIO:  1.7 ECA:  63 cm/sec LEFT ICA:  74/9 cm/sec CCA:  [id number] cm/sec SYSTOLIC ICA/CCA RATIO:  1.4 ECA:  66 cm/sec RIGHT CAROTID ARTERY: Preliminary grayscale images demonstrate scattered atherosclerotic plaque in the common and internal carotid arteries. The waveforms, velocities and flow velocity ratios however demonstrate no evidence of focal hemodynamically significant stenosis. RIGHT VERTEBRAL ARTERY:  Antegrade in nature. LEFT CAROTID ARTERY: Preliminary grayscale images of the left carotid system demonstrates scattered atherosclerotic plaque in the common and internal carotid artery. The waveforms, velocities and flow velocity ratios show no evidence  of focal hemodynamically significant stenosis. LEFT VERTEBRAL ARTERY:  Antegrade in nature. IMPRESSION: Extensive bilateral atherosclerotic plaque similar to that seen on prior exams without focal hemodynamically significant stenosis. Electronically Signed   By: Inez Catalina M.D.   On: 04/18/2021 01:20   DG Chest Portable 1 View  Result Date: 04/17/2021 CLINICAL DATA:  COVID-19 positivity with dizziness, initial encounter EXAM: PORTABLE CHEST 1 VIEW COMPARISON:  10/08/2018 FINDINGS: Cardiac shadow is within normal limits. Aortic calcifications are seen. Lungs are clear bilaterally. Postsurgical changes in the thoracolumbar spine are noted. IMPRESSION: No active disease. Electronically Signed   By: Inez Catalina M.D.   On: 04/17/2021 21:41   DG Abd Portable 1V  Result Date: 04/19/2021 CLINICAL DATA:   Nausea and vomiting.  COVID-19 viral infection. EXAM: PORTABLE ABDOMEN - 1 VIEW COMPARISON:  None. FINDINGS: The bowel gas pattern is normal. No radio-opaque calculi or other significant radiographic abnormality are seen. Thoracolumbar spine fusion hardware noted. IMPRESSION: Negative. Electronically Signed   By: Marlaine Hind M.D.   On: 04/19/2021 18:32   ECHOCARDIOGRAM COMPLETE  Result Date: 04/20/2021    ECHOCARDIOGRAM REPORT   Patient Name:   ZARIAN FENNELL Date of Exam: 04/19/2021 Medical Rec #:  [id number]      Height:       72.0 in Accession #:    [id number]     Weight:       200.0 lb Date of Birth:  Dec 01, 1945       BSA:          2.130 m Patient Age:    70 years       BP:           143/71 mmHg Patient Gender: M              HR:           70 bpm. Exam Location:  [facility] Procedure: 2D Echo, Cardiac Doppler and Color Doppler Indications:     Murmur R01.1  History:         Patient has no prior history of Echocardiogram examinations and                  Patient has prior history of Echocardiogram examinations. Risk                  Factors:Diabetes and Hypertension. CVA.  Sonographer:     Alyse Low Roar Referring Phys:  [place] Phys: Ida Rogue MD IMPRESSIONS  1. Left ventricular ejection fraction, by estimation, is 60 to 65%. The left ventricle has normal function. The left ventricle has no regional wall motion abnormalities. There is moderate left ventricular hypertrophy. Left ventricular diastolic parameters are consistent with Grade I diastolic dysfunction (impaired relaxation).  2. Right ventricular systolic function is normal. The right ventricular size is normal.  3. Left atrial size was mildly dilated.  4. The aortic valve is normal in structure. There is severe calcifcation of the aortic valve. Aortic valve regurgitation is mild. Moderate to severe aortic valve stenosis. Aortic valve area, by VTI measures 1.20 cm. Aortic valve mean gradient measures 28.5 mmHg. Aortic valve Vmax  measures 3.41 m/s. FINDINGS  Left Ventricle: Left ventricular ejection fraction, by estimation, is 60 to 65%. The left ventricle has normal function. The left ventricle has no regional wall motion abnormalities. The left ventricular internal cavity size was normal in size. There is  moderate left ventricular hypertrophy. Left ventricular diastolic parameters are consistent with Grade I  diastolic dysfunction (impaired relaxation). Right Ventricle: The right ventricular size is normal. No increase in right ventricular wall thickness. Right ventricular systolic function is normal. Left Atrium: Left atrial size was mildly dilated. Right Atrium: Right atrial size was normal in size. Pericardium: There is no evidence of pericardial effusion. Mitral Valve: The mitral valve is normal in structure. No evidence of mitral valve regurgitation. No evidence of mitral valve stenosis. Tricuspid Valve: The tricuspid valve is normal in structure. Tricuspid valve regurgitation is not demonstrated. No evidence of tricuspid stenosis. Aortic Valve: The aortic valve is normal in structure. There is severe calcifcation of the aortic valve. Aortic valve regurgitation is mild. Moderate to severe aortic stenosis is present. Aortic valve mean gradient measures 28.5 mmHg. Aortic valve peak gradient measures 46.5 mmHg. Aortic valve area, by VTI measures 1.20 cm. Pulmonic Valve: The pulmonic valve was not well visualized. Pulmonic valve regurgitation is not visualized. No evidence of pulmonic stenosis. Aorta: The aortic root is normal in size and structure. Venous: The pulmonary veins were not well visualized. The inferior vena cava is normal in size with greater than 50% respiratory variability, suggesting right atrial pressure of 3 mmHg. IAS/Shunts: No atrial level shunt detected by color flow Doppler.  LEFT VENTRICLE PLAX 2D LVIDd:         5.01 cm  Diastology LVIDs:         3.33 cm  LV e' medial:    4.35 cm/s LV PW:         1.23 cm  LV E/e'  medial:  14.3 LV IVS:        1.41 cm  LV e' lateral:   4.79 cm/s LVOT diam:     2.20 cm  LV E/e' lateral: 13.0 LV SV:         82 LV SV Index:   39 LVOT Area:     3.80 cm  RIGHT VENTRICLE RV Basal diam:  3.64 cm RV Mid diam:    3.35 cm RV S prime:     17.20 cm/s TAPSE (M-mode): 2.4 cm LEFT ATRIUM             Index       RIGHT ATRIUM           Index LA diam:        3.80 cm 1.78 cm/m  RA Area:     16.30 cm LA Vol (A2C):   76.1 ml 35.72 ml/m RA Volume:   39.00 ml  18.31 ml/m LA Vol (A4C):   65.7 ml 30.84 ml/m LA Biplane Vol: 72.0 ml 33.80 ml/m  AORTIC VALVE                    PULMONIC VALVE AV Area (Vmax):    1.17 cm     PV Vmax:        1.19 m/s AV Area (Vmean):   1.04 cm     PV Peak grad:   5.7 mmHg AV Area (VTI):     1.20 cm     RVOT Peak grad: 5 mmHg AV Vmax:           341.00 cm/s AV Vmean:          251.000 cm/s AV VTI:            0.687 m AV Peak Grad:      46.5 mmHg AV Mean Grad:      28.5 mmHg LVOT Vmax:  105.00 cm/s LVOT Vmean:        68.700 cm/s LVOT VTI:          0.216 m LVOT/AV VTI ratio: 0.31  AORTA Ao Asc diam: 3.20 cm MITRAL VALVE MV Area (PHT): 2.55 cm     SHUNTS MV Decel Time: 298 msec     Systemic VTI:  0.22 m MV E velocity: 62.40 cm/s   Systemic Diam: 2.20 cm MV A velocity: 120.00 cm/s MV E/A ratio:  0.52 MV A Prime:    7.0 cm/s Ida Rogue MD Electronically signed by Ida Rogue MD Signature Date/Time: 04/20/2021/9:14:23 AM    Final

## 2021-04-23 NOTE — Progress Notes (Signed)
Physical Therapy Treatment Patient Details Name: Victor Fox MRN: [id number] DOB: 01-16-1946 Today's Date: 04/23/2021    History of Present Illness 75 y.o. male with a past medical history of significant diabetes mellitus type 2, hypertension, hyperlipidemia, neuropathy, vertigo, carotid artery disease, coronary artery disease, history of CVA, former smoker, resented to the hospital with c/o unsteainess/falls starting 8/13, MRI shows ischemic infarct.    PT Comments    Pt received sitting in recliner, agreeable to therapy. Ambulation was performed with increased safety as he remained within RW during the straight ways. He did continue to require VC to remain within RW during turns. Improved activity tolerance demo today as he also performed standing balance and 5 STS after ambulation. He continues to demo decreased stability in standing and general muscle weakness as he was unable to stand from recliner without UE support. Would benefit from skilled PT to address above deficits and promote optimal return to PLOF.   Follow Up Recommendations  SNF;Supervision for mobility/OOB     Equipment Recommendations  None recommended by PT (TBD in next venue)    Recommendations for Other Services       Precautions / Restrictions Precautions Precautions: Fall Restrictions Weight Bearing Restrictions: No    Mobility  Bed Mobility Overal bed mobility: Needs Assistance Bed Mobility: Supine to Sit     Supine to sit: Supervision;HOB elevated     General bed mobility comments: not assessed - pt in recliner at beginning and end of sesison    Transfers Overall transfer level: Needs assistance Equipment used: Rolling walker (2 wheeled) Transfers: Sit to/from Stand Sit to Stand: Min guard         General transfer comment: CGA to steady upon standing. 6 reps - 1 functional and 5 for exercise  Ambulation/Gait Ambulation/Gait assistance: Min guard Gait Distance (Feet): 180  Feet Assistive device: Rolling walker (2 wheeled) Gait Pattern/deviations: Decreased stride length;Trunk flexed;Step-through pattern;Narrow base of support Gait velocity: decreased   General Gait Details: Walking laps within room. VC to remain within RW during turns; improvement while ambulating straight. CGA to steady. Increased cadence with decreased stride length. Fair navigation of obstacles within room.   Stairs             Wheelchair Mobility    Modified Rankin (Stroke Patients Only)       Balance Overall balance assessment: Needs assistance Sitting-balance support: Feet supported;No upper extremity supported Sitting balance-Leahy Scale: Fair Sitting balance - Comments: supervision sitting EOB   Standing balance support: During functional activity;No upper extremity supported Standing balance-Leahy Scale: Poor Standing balance comment: Pt stood unsupported for 2 minutes - static and dynamic including lateral weight shifts. Performed for balance training.                            Cognition Arousal/Alertness: Awake/alert Behavior During Therapy: WFL for tasks assessed/performed Overall Cognitive Status: Impaired/Different from baseline Area of Impairment: Orientation;Safety/judgement                 Orientation Level: Disoriented to;Place;Time       Safety/Judgement: Decreased awareness of safety;Decreased awareness of deficits     General Comments: Able to follow all single and multi-step commands.      Exercises Other Exercises Other Exercises: Pt performed 5 STS for strength training - attempted one without UE support with difficulty. Balance training with static and dynamic standing for 2 minutes without RW. Other Exercises: LBD,  sup>sit, sit<>stand,  sitting/standing blanace/tolerance, ~69f mobility, SLUMS    General Comments        Pertinent Vitals/Pain Pain Assessment: No/denies pain    Home Living                       Prior Function            PT Goals (current goals can now be found in the care plan section) Acute Rehab PT Goals Patient Stated Goal: get stronger    Frequency    Min 2X/week      PT Plan      Co-evaluation              AM-PAC PT "6 Clicks" Mobility   Outcome Measure  Help needed turning from your back to your side while in a flat bed without using bedrails?: A Little Help needed moving from lying on your back to sitting on the side of a flat bed without using bedrails?: A Little Help needed moving to and from a bed to a chair (including a wheelchair)?: A Little Help needed standing up from a chair using your arms (e.g., wheelchair or bedside chair)?: A Little Help needed to walk in hospital room?: A Little Help needed climbing 3-5 steps with a railing? : A Lot 6 Click Score: 17    End of Session Equipment Utilized During Treatment: Gait belt Activity Tolerance: Patient tolerated treatment well Patient left: in chair;with chair alarm set;with call bell/phone within reach;with restraints reapplied Nurse Communication: Mobility status PT Visit Diagnosis: Muscle weakness (generalized) (M62.81);Difficulty in walking, not elsewhere classified (R26.2);Unsteadiness on feet (R26.81)     Time: [id number] Time Calculation (min) (ACUTE ONLY): 23 min  Charges:  $Therapeutic Activity: 23-37 mins                     KPatrina LeveringPT, DPT 04/23/21 4:41 PM [phone]

## 2021-04-24 DIAGNOSIS — R55 Syncope and collapse: Secondary | ICD-10-CM | POA: Diagnosis not present

## 2021-04-24 DIAGNOSIS — U071 COVID-19: Secondary | ICD-10-CM | POA: Diagnosis present

## 2021-04-24 LAB — GLUCOSE, CAPILLARY
Glucose-Capillary: 231 mg/dL — ABNORMAL HIGH (ref 70–99)
Glucose-Capillary: 167 mg/dL — ABNORMAL HIGH (ref 70–99)
Glucose-Capillary: 233 mg/dL — ABNORMAL HIGH (ref 70–99)
Glucose-Capillary: 258 mg/dL — ABNORMAL HIGH (ref 70–99)

## 2021-04-24 NOTE — Progress Notes (Signed)
Inpatient Diabetes Program Recommendations  AACE/ADA: New Consensus Statement on Inpatient Glycemic Control (2015)  Target Ranges:  Prepandial:   less than 140 mg/dL      Peak postprandial:   less than 180 mg/dL (1-2 hours)      Critically ill patients:  140 - 180 mg/dL   Lab Results  Component Value Date   GLUCAP 231 (H) 04/24/2021   HGBA1C 9.5 (H) 04/18/2021    Review of Glycemic Control Results for ISANDRO, SCHORER (MRN [id number]) as of 04/24/2021 12:20  Ref. Range 04/23/2021 08:49 04/23/2021 12:38 04/23/2021 17:23 04/23/2021 21:42 04/24/2021 08:43  Glucose-Capillary Latest Ref Range: 70 - 99 mg/dL 131 (H) 190 (H) 219 (H) 314 (H) 231 (H)  Diabetes history: DM 2 Outpatient Diabetes medications: Novolin 70/30 15 units once a day- patient cannot see to draw up insulin (states he stopped being able to see last month) Current orders for Inpatient glycemic control:  Novolog moderate tid with meals and HS Novolog 70/30 12 units bid  Inpatient Diabetes Program Recommendations:    Consider increasing 70/30 to 15 units bid. Also consider reducing Novolog correction to sensitive tid with meals.   Thanks,  Adah Perl, RN, BC-ADM Inpatient Diabetes Coordinator Pager [phone]  (8a-5p)

## 2021-04-24 NOTE — TOC Progression Note (Signed)
Transition of Care [facility]) - Progression Note    Patient Details  Name: Victor Fox MRN: [id number] Date of Birth: 06-05-1946  Transition of Care [facility]) CM/SW [place], RN Phone Number: 04/24/2021, 2:57 PM  Clinical Narrative:   Spoke to [place] worker, Oretha Ellis [phone], informed her of bed search.  [name] will be notified of bed offers when they arrive.  TOC to follow to discharge.          Expected Discharge Plan and Services                                                 Social Determinants of Health (SDOH) Interventions    Readmission Risk Interventions No flowsheet data found.

## 2021-04-24 NOTE — Progress Notes (Signed)
PROGRESS NOTE    Victor Fox   [id number]  DOB: [birth year]  PCP: Patient, No Pcp Per (Inactive)    DOA: 04/17/2021 LOS: 6    Brief Narrative / Hospital Course to Date:   Per HPI from H&P - "Victor Fox is a 75 y.o. male with a past medical history of significant diabetes mellitus type 2, hypertension, hyperlipidemia, neuropathy, vertigo, carotid artery disease, coronary artery disease, history of CVA, former smoker, resented to the hospital with chief complaints of dizziness upon sitting up and standing, in the ER initial work-up negative follow-up MRI shows ischemic infarcts along with loud systolic murmur.  He was admitted for further care."     Medically stable for SNF discharge, needs to finish 10-day quarantine on 04/27/2021.  Assessment & Plan   Active Problems:   Near syncope   Self-care deficit   Acute on chronic dizziness -with positive orthostatics and postural component.  Does have acute CVA however dizziness seems more consistent with orthostatic, insetting of aortic stenosis.  Improved with BP stabilization. 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   No prior history of cardiac arrhythmias. Echo showed EF 60 to 65%, moderate LVH, grade 1 diastolic dysfunction moderate to severe aortic stenosis. Neurology and cardiology were consulted. Continue DAPT with aspirin Plavix x3 weeks, aspirin monotherapy thereafter. Continue statin. Monitor on telemetry. Cardiology follow-up for Zio patch. PT and OT recommend SNF, placement pending once outside COVID isolation.  COVID-19 infection -incidental positive.  Patient has no respiratory symptoms.  Stable inflammatory markers.  Possibly contributed to his acute CVA. Monitor clinically.  Hypertension with orthostatic hypotension -continue on Norvasc and low-dose Vasotec (added 8/22).  ?  A flutter versus  artifact (see initial EKG from the ED).  Subsequent EKG was sinus rhythm.  Cardiology feels this was artifact.  TSH normal.   Monitor on telemetry. Echo results as above.  Moderate to severe aortic stenosis -outpatient cardiology follow-up.  Avoid drops in BP and preload.  Dyslipidemia -on statin  History of CAD -stable, no active chest pain or acute ischemic EKG changes.  Continue aspirin and Plavix  Chronic diastolic CHF -euvolemic and compensated.  Monitor volume status  Type 2 diabetes -continue on Lantus and sliding scale NovoLog    Patient BMI: Body mass index is 27.12 kg/m.   DVT prophylaxis: enoxaparin (LOVENOX) injection 40 mg Start: 04/17/21 2200   Diet:  Diet Orders (From admission, onward)     Start     Ordered   04/20/21 1020  DIET SOFT Room service appropriate? Yes; Fluid consistency: Thin  Diet effective now       Question Answer Comment  Room service appropriate? Yes   Fluid consistency: Thin      04/20/21 1019              Code Status: Full Code   Subjective 04/24/21    Patient seen at bedside this morning, appeared sleeping but woke easily to voice.  He denies any acute complaints including fever chills, cough or shortness of breath, chest pain or palpitations.  Not currently having dizziness.  No acute events reported.   Disposition Plan & Communication   Status is: Inpatient  Remains inpatient appropriate because: Requires SNF placement at discharge, awaiting 10-day COVID isolation.  Dispo: The patient is from: Home              Anticipated d/c is  to: SNF              Patient currently is medically stable to d/c.   Difficult to place patient No    Consults, Procedures, Significant Events   Consultants:  Cardiology Neurology  Procedures:  Echo  Antimicrobials:  Anti-infectives (From admission, onward)    Start     Dose/Rate Route Frequency Ordered Stop   04/17/21 2230  nirmatrelvir/ritonavir EUA (PAXLOVID) 3 tablet  Status:   Discontinued        3 tablet Oral 2 times daily 04/17/21 2218 04/17/21 2225         Micro    Objective   Vitals:   04/23/21 2140 04/24/21 0036 04/24/21 0458 04/24/21 0846  BP: (!) 140/59 (!) 149/71 (!) 158/66 (!) 145/52  Pulse: 63 66 (!) 58 (!) 52  Resp: '18 16 18 18  '$ Temp: 98.5 F (36.9 C) (!) 97.4 F (36.3 C) 97.6 F (36.4 C) 97.8 F (36.6 C)  TempSrc:  Oral Oral Oral  SpO2: 98% 98% 96% 100%  Weight:      Height:        Intake/Output Summary (Last 24 hours) at 04/24/2021 1515 Last data filed at 04/24/2021 1242 Gross per 24 hour  Intake 697 ml  Output 2050 ml  Net -1353 ml   Filed Weights   04/17/21 1639 04/17/21 1643  Weight: 90.7 kg 90.7 kg    Physical Exam:  General exam: awake, alert, no acute distress HEENT: moist mucus membranes, hearing grossly normal  Respiratory system: CTAB, no wheezes, rales or rhonchi, normal respiratory effort.  On room air Cardiovascular system: normal S1/S2, RRR, no pedal edema.   Central nervous system: A&O x3. no gross focal neurologic deficits, normal speech Extremities: moves all, no edema, normal tone No rashes, lesions or ulcers Psychiatry: normal mood, congruent affect, judgement and insight appear normal  Labs   Data Reviewed: I have personally reviewed following labs and imaging studies  CBC: Recent Labs  Lab 04/18/21 0730 04/19/21 0326 04/20/21 0604 04/21/21 0638 04/22/21 0446  WBC 7.8 8.3 7.3 8.0 8.0  NEUTROABS  --  4.9 4.3 4.6 4.2  HGB 14.8 14.9 14.1 14.2 14.8  HCT 40.9 42.4 39.6 40.9 41.9  MCV 82.5 82.3 83.7 83.8 83.6  PLT 191 208 178 181 [id number]   Basic Metabolic Panel: Recent Labs  Lab 04/18/21 0730 04/19/21 0326 04/20/21 0604 04/21/21 0638 04/22/21 0446  NA 138 140 135 139 140  K 3.2* 3.1* 3.9 3.7 4.1  CL 101 105 100 104 103  CO2 '28 28 26 29 31  '$ GLUCOSE 219* 171* 302* 184* 177*  BUN '9 9 13 14 16  '$ CREATININE 0.68 0.87 0.86 0.72 0.85  CALCIUM 8.9 8.8* 8.8* 9.1 9.2  MG 2.2 2.2 2.0 2.1 2.3    GFR: Estimated Creatinine Clearance: 83.7 mL/min (by C-G formula based on SCr of 0.85 mg/dL). Liver Function Tests: Recent Labs  Lab 04/18/21 0730 04/19/21 0326 04/20/21 0604 04/21/21 0638 04/22/21 0446  AST '16 15 17 '$ 14* 13*  ALT '13 11 12 13 13  '$ ALKPHOS 75 75 70 68 68  BILITOT 1.0 1.0 0.9 0.7 0.7  PROT 6.4* 6.1* 5.7* 5.9* 6.1*  ALBUMIN 3.7 3.4* 3.1* 3.2* 3.3*   No results for input(s): LIPASE, AMYLASE in the last 168 hours. No results for input(s): AMMONIA in the last 168 hours. Coagulation Profile: No results for input(s): INR, PROTIME in the last 168 hours. Cardiac Enzymes: No results for input(s): CKTOTAL, CKMB, CKMBINDEX, TROPONINI  in the last 168 hours. BNP (last 3 results) No results for input(s): PROBNP in the last 8760 hours. HbA1C: No results for input(s): HGBA1C in the last 72 hours. CBG: Recent Labs  Lab 04/23/21 1238 04/23/21 1723 04/23/21 2142 04/24/21 0843 04/24/21 1212  GLUCAP 190* 219* 314* 231* 167*   Lipid Profile: No results for input(s): CHOL, HDL, LDLCALC, TRIG, CHOLHDL, LDLDIRECT in the last 72 hours. Thyroid Function Tests: No results for input(s): TSH, T4TOTAL, FREET4, T3FREE, THYROIDAB in the last 72 hours. Anemia Panel: No results for input(s): VITAMINB12, FOLATE, FERRITIN, TIBC, IRON, RETICCTPCT in the last 72 hours. Sepsis Labs: No results for input(s): PROCALCITON, LATICACIDVEN in the last 168 hours.  Recent Results (from the past 240 hour(s))  Resp Panel by RT-PCR (Flu A&B, Covid) Nasopharyngeal Swab     Status: Abnormal   Collection Time: 04/17/21  7:30 PM   Specimen: Nasopharyngeal Swab; Nasopharyngeal(NP) swabs in vial transport medium  Result Value Ref Range Status   SARS Coronavirus 2 by RT PCR POSITIVE (A) NEGATIVE Final    Comment: READ BACK AND VERIFIED BY [place] LOCKEE, RN AT 2039 04/17/21 BY [facility] (NOTE) SARS-CoV-2 target nucleic acids are DETECTED.  The SARS-CoV-2 RNA is generally detectable in upper  respiratory specimens during the acute phase of infection. Positive results are indicative of the presence of the identified virus, but do not rule out bacterial infection or co-infection with other pathogens not detected by the test. Clinical correlation with patient history and other diagnostic information is necessary to determine patient infection status. The expected result is Negative.  Fact Sheet for Patients: EntrepreneurPulse.com.au  Fact Sheet for Healthcare Providers: IncredibleEmployment.be  This test is not yet approved or cleared by the [place] FDA and  has been authorized for detection and/or diagnosis of SARS-CoV-2 by FDA under an Emergency Use Authorization (EUA).  This EUA will remain in effect (meaning this test can be used) for the dur ation of  the COVID-19 declaration under Section 564(b)(1) of the Act, 21 U.S.C. section 360bbb-3(b)(1), unless the authorization is terminated or revoked sooner.     Influenza A by PCR NEGATIVE NEGATIVE Final   Influenza B by PCR NEGATIVE NEGATIVE Final    Comment: (NOTE) The Xpert Xpress SARS-CoV-2/FLU/RSV plus assay is intended as an aid in the diagnosis of influenza from Nasopharyngeal swab specimens and should not be used as a sole basis for treatment. Nasal washings and aspirates are unacceptable for Xpert Xpress SARS-CoV-2/FLU/RSV testing.  Fact Sheet for Patients: EntrepreneurPulse.com.au  Fact Sheet for Healthcare Providers: IncredibleEmployment.be  This test is not yet approved or cleared by the [place] FDA and has been authorized for detection and/or diagnosis of SARS-CoV-2 by FDA under an Emergency Use Authorization (EUA). This EUA will remain in effect (meaning this test can be used) for the duration of the COVID-19 declaration under Section 564(b)(1) of the Act, 21 U.S.C. section 360bbb-3(b)(1), unless the authorization is  terminated or revoked.  Performed at [facility], [address], [place], [place] [address]       Imaging Studies   No results found.   Medications   Scheduled Meds:  amLODipine  10 mg Oral Daily   aspirin EC  81 mg Oral Daily   atorvastatin  80 mg Oral Daily   clopidogrel  75 mg Oral Daily   enalapril  5 mg Oral Daily   enoxaparin (LOVENOX) injection  40 mg Subcutaneous Q24H   insulin aspart  0-15 Units Subcutaneous TID WC  insulin aspart protamine- aspart  12 Units Subcutaneous BID WC   Continuous Infusions:     LOS: 6 days    Time spent: 30 minutes    Ezekiel Slocumb, DO [facility]  04/24/2021, 3:15 PM      If 7PM-7AM, please contact night-coverage. How to contact the [facility] Attending or Consulting provider [place] or covering provider during after hours [place], for this patient?    Check the care team in [facility] and look for a) attending/consulting [facility] provider listed and b) the [facility] team listed Log into [email or web address] and use 's universal password to access. If you do not have the password, please contact the hospital operator. Locate the [facility] provider you are looking for under [facility] and page to a number that you can be directly reached. If you still have difficulty reaching the provider, please page the [facility] (Director on Call) for the Hospitalists listed on amion for assistance.

## 2021-04-24 NOTE — Progress Notes (Signed)
Occupational Therapy Treatment Patient Details Name: Victor Fox MRN: [id number] DOB: 04-13-1946 Today's Date: 04/24/2021    History of present illness 75 y.o. male with a past medical history of significant diabetes mellitus type 2, hypertension, hyperlipidemia, neuropathy, vertigo, carotid artery disease, coronary artery disease, history of CVA, former smoker, resented to the hospital with c/o unsteainess/falls starting 8/13, MRI shows ischemic infarct.   OT comments  Pt seen for OT tx this date to f/u re: safety with ADLs/ADL mobility. OT engages pt in fxl mobility in room with RW with CGA x2 laps around EOB and then to the sink to perform standing g/h tasks. Pt tolerates ~4-5 mins static standing for washing face and hands. Pt requires CGA for standing balance, but requires MIN A as he fatigues d/t 2 episodes of near LOB to his R side once fatiguing in standing. OT pulls recliner up behind pt and pt requires CGA and MIN cues to safely come to sitting. Pt left with lap headstart alarm on, all needs met and in reach including call light. Will continue to follow.    Follow Up Recommendations  SNF    Equipment Recommendations  3 in 1 bedside commode    Recommendations for Other Services      Precautions / Restrictions Precautions Precautions: Fall Restrictions Weight Bearing Restrictions: No       Mobility Bed Mobility Overal bed mobility: Needs Assistance Bed Mobility: Supine to Sit     Supine to sit: Supervision;HOB elevated     General bed mobility comments: increased time    Transfers Overall transfer level: Needs assistance Equipment used: Rolling walker (2 wheeled) Transfers: Sit to/from Stand Sit to Stand: Min guard         General transfer comment: cues for safety, steadying upon initial CTS    Balance Overall balance assessment: Needs assistance Sitting-balance support: Feet supported;No upper extremity supported Sitting balance-Leahy Scale: Fair      Standing balance support: During functional activity;No upper extremity supported Standing balance-Leahy Scale: Fair Standing balance comment: F static standing with UE support. P dynamic standing/cannot tolerate a challenge                           ADL either performed or assessed with clinical judgement   ADL Overall ADL's : Needs assistance/impaired     Grooming: Min guard;Wash/dry hands;Wash/dry face;Minimal assistance;Standing Grooming Details (indicate cue type and reason): ~4-5 mins standing to perform evening g/h tasks, initially, SUPV, but ultimately requires CGA to MIN A d/t 2 near LOB requiring OT assist for safety, possibly d/t fatigue.                             Functional mobility during ADLs: Min guard;Minimal assistance;Cueing for safety;Rolling walker (back and forth in room x2 and then to/from sink. recliner pulled up behind pt after static standing at sink for ADLs d/t pt fatigueing. CNA notified.)       Vision Patient Visual Report: No change from baseline     Perception     Praxis      Cognition Arousal/Alertness: Awake/alert Behavior During Therapy: WFL for tasks assessed/performed Overall Cognitive Status: Impaired/Different from baseline Area of Impairment: Orientation;Safety/judgement                 Orientation Level: Disoriented to;Place;Situation       Safety/Judgement: Decreased awareness of safety;Decreased awareness of deficits  General Comments: Able to follow simple one step commands and most multi step        Exercises Other Exercises Other Exercises: OT engages pt in standing self care sink-side with RW for balance and pt tolerates ~5-6 mins   Shoulder Instructions       General Comments      Pertinent Vitals/ Pain       Pain Assessment: No/denies pain  Home Living                                          Prior Functioning/Environment              Frequency  Min  1X/week        Progress Toward Goals  OT Goals(current goals can now be found in the care plan section)  Progress towards OT goals: Progressing toward goals  Acute Rehab OT Goals Patient Stated Goal: get stronger OT Goal Formulation: With patient Time For Goal Achievement: 05/02/21 Potential to Achieve Goals: Good  Plan Discharge plan remains appropriate;Frequency remains appropriate    Co-evaluation                 AM-PAC OT "6 Clicks" Daily Activity     Outcome Measure   Help from another person eating meals?: None Help from another person taking care of personal grooming?: A Little Help from another person toileting, which includes using toliet, bedpan, or urinal?: A Little Help from another person bathing (including washing, rinsing, drying)?: A Little Help from another person to put on and taking off regular upper body clothing?: A Little Help from another person to put on and taking off regular lower body clothing?: A Little 6 Click Score: 19    End of Session Equipment Utilized During Treatment: Rolling walker  OT Visit Diagnosis: Other abnormalities of gait and mobility (R26.89);Muscle weakness (generalized) (M62.81)   Activity Tolerance Patient tolerated treatment well   Patient Left with call bell/phone within reach;in chair;with chair alarm set (headstart lap alarm)   Nurse Communication Mobility status        Time: 1750-1816 OT Time Calculation (min): 26 min  Charges: OT General Charges $OT Visit: 1 Visit OT Treatments $Self Care/Home Management : 8-22 mins $Therapeutic Activity: 8-22 mins  Gerrianne Scale, [place], OTR/L ascom [phone] 04/24/21, 6:25 PM

## 2021-04-24 NOTE — NC FL2 (Signed)
[place] LEVEL OF CARE SCREENING TOOL     IDENTIFICATION  Patient Name: Victor Fox Birthdate: 1946-08-29 Sex: male Admission Date (Current Location): 04/17/2021  [facility] and [place] Number:  Engineering geologist and Address:  [facility], [address] [place], [place] [address]      Provider Number: [id number]  Attending Physician Name and Address:  Ezekiel Slocumb, DO  Relative Name and Phone Number:  Trude Mcburney     [phone] APS worker    Current Level of Care: Hospital Recommended Level of Care: [place] Prior Approval Number:    Date Approved/Denied:   PASRR Number: in process  Discharge Plan: SNF    Current Diagnoses: Patient Active Problem List   Diagnosis Date Noted   Self-care deficit 04/19/2021   Near syncope 0000000   Acute metabolic encephalopathy [id number]   Hypertensive urgency 10/19/2019   Acute encephalopathy 10/19/2019   Pain due to onychomycosis of toenails of both feet 02/21/2019   Adenomatous polyp 11/05/2018   Blood pressure elevated 11/05/2018   Neuropathy 11/05/2018   Pneumococcal vaccination given 11/05/2018   Pulmonary nodules 10/08/2018   Coronary artery disease 10/08/2018   CAP (community acquired pneumonia) 09/01/2018   Overweight (BMI 25.0-29.9) 03/10/2018   Ataxia, 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   Benign neoplasm 01/19/2018   Onychomycosis due to dermatophyte 01/19/2018   Mononeuritis 01/19/2018   Neck pain 01/19/2018   Vitamin D deficiency 01/19/2018   Aortic stenosis, mild 01/05/2018   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 01/05/2018   Vertigo 12/17/2017   Uncontrolled diabetes mellitus with complication, with long-term current use of insulin ([place]) 12/17/2017   Hyperlipidemia 04/14/2016   Pain and swelling of left wrist 07/10/2015   Carotid artery disease ([place])    Heart murmur 10/03/2013   HTN (hypertension) 10/03/2013    CVA (cerebral vascular accident) ([place]) 10/03/2013   Carotid artery stenosis 10/03/2013    Orientation RESPIRATION BLADDER Height & Weight     Self, Place (memory impairment)  Normal Continent Weight: 90.7 kg Height:  6' (182.9 cm)  BEHAVIORAL SYMPTOMS/MOOD NEUROLOGICAL BOWEL NUTRITION STATUS      Continent Diet (soft  Patient is diabetic)  AMBULATORY STATUS COMMUNICATION OF NEEDS Skin   Limited Assist (fatigues easily) Verbally Bruising (bilateral arms)                       Personal Care Assistance Level of Assistance  Bathing, Feeding, Dressing Bathing Assistance: Limited assistance (fatigues easily) Feeding assistance: Limited assistance Dressing Assistance: Limited assistance     Functional Limitations Info  Sight, Hearing, Speech Sight Info: Adequate Hearing Info: Impaired Speech Info: Adequate    SPECIAL CARE FACTORS FREQUENCY  PT (By licensed PT), OT (By licensed OT)     PT Frequency: 5x weekly OT Frequency: 5x weekly            Contractures Contractures Info: Not present    Additional Factors Info  Code Status, Allergies Code Status Info: FULL CODE Allergies Info: No Known Allergies           Current Medications (04/24/2021):  This is the current hospital active medication list Current Facility-Administered Medications  Medication Dose Route Frequency Provider Last Rate Last Admin   albuterol (VENTOLIN HFA) 108 (90 Base) MCG/ACT inhaler 2 puff  2 puff Inhalation Q6H PRN Thurnell Lose, MD       amLODipine (NORVASC) tablet 10 mg  10 mg Oral Daily  Thurnell Lose, MD   10 mg at 04/24/21 1001   aspirin EC tablet 81 mg  81 mg Oral Daily Leslee Home, DO   81 mg at 04/24/21 1002   atorvastatin (LIPITOR) tablet 80 mg  80 mg Oral Daily Derek Jack, MD   80 mg at 04/24/21 1001   clopidogrel (PLAVIX) tablet 75 mg  75 mg Oral Daily Oswald Hillock, RPH   75 mg at 04/24/21 1002   enalapril (VASOTEC) tablet 5 mg  5 mg Oral Daily Thurnell Lose, MD   5 mg at 04/24/21 1002   enoxaparin (LOVENOX) injection 40 mg  40 mg Subcutaneous Q24H Imagene Sheller S, DO   40 mg at 04/23/21 2258   hydrALAZINE (APRESOLINE) injection 10 mg  10 mg Intravenous Q6H PRN Thurnell Lose, MD       insulin aspart (novoLOG) injection 0-15 Units  0-15 Units Subcutaneous TID WC Imagene Sheller S, DO   3 Units at 04/24/21 1240   insulin aspart protamine- aspart (NOVOLOG MIX 70/30) injection 12 Units  12 Units Subcutaneous BID WC Thurnell Lose, MD   12 Units at 04/24/21 [id number]   meclizine (ANTIVERT) tablet 25 mg  25 mg Oral TID PRN Imagene Sheller S, DO       ondansetron (ZOFRAN) injection 4 mg  4 mg Intravenous Q6H PRN Thurnell Lose, MD         Discharge Medications: Please see discharge summary for a list of discharge medications.  Relevant Imaging Results:  Relevant Lab Results:   Additional Information SSN [id number]  Pete Pelt, RN

## 2021-04-25 DIAGNOSIS — I639 Cerebral infarction, unspecified: Secondary | ICD-10-CM | POA: Diagnosis not present

## 2021-04-25 LAB — GLUCOSE, CAPILLARY
Glucose-Capillary: 213 mg/dL — ABNORMAL HIGH (ref 70–99)
Glucose-Capillary: 223 mg/dL — ABNORMAL HIGH (ref 70–99)
Glucose-Capillary: 167 mg/dL — ABNORMAL HIGH (ref 70–99)
Glucose-Capillary: 286 mg/dL — ABNORMAL HIGH (ref 70–99)

## 2021-04-25 MED ORDER — INSULIN ASPART 100 UNIT/ML IJ SOLN
0.0000 [IU] | Freq: Three times a day (TID) | INTRAMUSCULAR | Status: DC
  Administered 2021-04-25: 3 [IU] via SUBCUTANEOUS
  Administered 2021-04-25: 18:00:00 2 [IU] via SUBCUTANEOUS
  Administered 2021-04-25: 10:00:00 3 [IU] via SUBCUTANEOUS
  Administered 2021-04-26 (×2): 1 [IU] via SUBCUTANEOUS
  Administered 2021-04-26 – 2021-04-27 (×2): 2 [IU] via SUBCUTANEOUS
  Administered 2021-04-28: 17:00:00 3 [IU] via SUBCUTANEOUS
  Administered 2021-04-28 – 2021-04-29 (×4): 2 [IU] via SUBCUTANEOUS
  Administered 2021-04-30: 7 [IU] via SUBCUTANEOUS
  Administered 2021-04-30: 5 [IU] via SUBCUTANEOUS
  Administered 2021-05-01: 7 [IU] via SUBCUTANEOUS
  Administered 2021-05-01: 13:00:00 1 [IU] via SUBCUTANEOUS
  Administered 2021-05-01: 10:00:00 2 [IU] via SUBCUTANEOUS
  Administered 2021-05-02: 18:00:00 7 [IU] via SUBCUTANEOUS
  Administered 2021-05-02 – 2021-05-03 (×3): 2 [IU] via SUBCUTANEOUS
  Administered 2021-05-03: 17:00:00 5 [IU] via SUBCUTANEOUS
  Administered 2021-05-04 (×2): 3 [IU] via SUBCUTANEOUS
  Administered 2021-05-04: 10:00:00 1 [IU] via SUBCUTANEOUS
  Administered 2021-05-05 (×2): 2 [IU] via SUBCUTANEOUS
  Administered 2021-05-05: 18:00:00 3 [IU] via SUBCUTANEOUS
  Administered 2021-05-06: 5 [IU] via SUBCUTANEOUS
  Administered 2021-05-06: 1 [IU] via SUBCUTANEOUS
  Administered 2021-05-06 – 2021-05-07 (×3): 3 [IU] via SUBCUTANEOUS
  Administered 2021-05-07 – 2021-05-08 (×2): 2 [IU] via SUBCUTANEOUS
  Filled 2021-04-25 (×34): qty 1

## 2021-04-25 MED ORDER — ENALAPRIL MALEATE 10 MG PO TABS
10.0000 mg | ORAL_TABLET | Freq: Every day | ORAL | Status: DC
  Administered 2021-04-26 – 2021-04-28 (×3): 10 mg via ORAL
  Filled 2021-04-25 (×4): qty 1

## 2021-04-25 MED ORDER — INSULIN ASPART PROT & ASPART (70-30 MIX) 100 UNIT/ML ~~LOC~~ SUSP
15.0000 [IU] | Freq: Two times a day (BID) | SUBCUTANEOUS | Status: DC
  Administered 2021-04-25 – 2021-04-26 (×3): 15 [IU] via SUBCUTANEOUS
  Filled 2021-04-25: qty 10

## 2021-04-25 NOTE — Progress Notes (Addendum)
PROGRESS NOTE    Victor Fox   [id number]  DOB: July 18, 1946  PCP: Patient, No Pcp Per (Inactive)    DOA: 04/17/2021 LOS: 7    Brief Narrative / Hospital Course to Date:   Per HPI from H&P - "Victor Fox is a 75 y.o. male with a past medical history of significant diabetes mellitus type 2, hypertension, hyperlipidemia, neuropathy, vertigo, carotid artery disease, coronary artery disease, history of CVA, former smoker, resented to the hospital with chief complaints of dizziness upon sitting up and standing, in the ER initial work-up negative follow-up MRI shows ischemic infarcts along with loud systolic murmur.  He was admitted for further care."     Medically stable for SNF discharge, needs to finish 10-day quarantine on 04/27/2021.  Assessment & Plan   Principal Problem:   Acute CVA (cerebrovascular accident) [facility]) Active Problems:   Near syncope   Self-care deficit   Lab test positive for detection of COVID-19 virus   Acute on chronic dizziness -with positive orthostatics and postural component.  Does have acute CVA however dizziness seems more consistent with orthostatic, insetting of aortic stenosis.  Improved with BP stabilization. 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   No prior history of cardiac arrhythmias. Echo showed EF 60 to 65%, moderate LVH, grade 1 diastolic dysfunction moderate to severe aortic stenosis. Neurology and cardiology were consulted. Continue DAPT with aspirin Plavix x3 weeks, aspirin monotherapy thereafter. Continue statin. Monitor on telemetry. Cardiology follow-up for Zio patch. PT and OT recommend SNF, placement pending once outside COVID isolation.  COVID-19 infection -incidental positive.  Patient has no respiratory symptoms.  Stable inflammatory markers.  Possibly contributed to his acute CVA. Monitor  clinically.  Dysphagia -showing signs of aspiration with thin liquids.  Seen by SLP for swallow evaluation.  Now on nectar thickened liquids. -- Aspiration precautions -- Diet per SLP recs  Hypertension with orthostatic hypotension - 8/25 BPs remain uncontrolled.   continue on Norvasc 10 mg. Low-dose enalapril added 8/22. Increase enalapril to 10 mg. Monitor closely for orthostatic symptoms.   ?  A flutter versus artifact (see initial EKG from the ED).  Subsequent EKG was sinus rhythm.  Cardiology feels this was artifact.  TSH normal.   Monitor on telemetry. Echo results as above.  Moderate to severe aortic stenosis -outpatient cardiology follow-up.  Avoid drops in BP and preload.  Dyslipidemia -on statin  History of CAD -stable, no active chest pain or acute ischemic EKG changes.  Continue aspirin and Plavix  Chronic diastolic CHF -euvolemic and compensated.  Monitor volume status  Type 2 diabetes -continue on 70/30 and sliding scale NovoLog.  8/25: Increased 70/30 to 15 units twice daily.  Decrease sliding scale NovoLog from moderate (0-15) to sensitive (0-9) TID WC    Patient BMI: Body mass index is 27.12 kg/m.   DVT prophylaxis: enoxaparin (LOVENOX) injection 40 mg Start: 04/17/21 2200   Diet:  Diet Orders (From admission, onward)     Start     Ordered   04/25/21 1430  DIET DYS 3 Room service appropriate? Yes with Assist; Fluid consistency: Nectar Thick  Diet effective now       Comments: Please add extra gravy on meats, potatoes.  Likes meatloaf and pintos w/ mashed potatoes.  Question Answer Comment  Room service appropriate? Yes with Assist   Fluid consistency: Nectar Thick      04/25/21  1433              Code Status: Full Code   Subjective 04/25/21    Patient was sleeping but woke easily to voice when seen today.  Nurse reports that he got quite choked while swallowing liquids this morning.  Patient endorses a cough.  He does not think he has had  issues with getting choked on swallowing in the past but seems unsure.  No acute events reported.  He has no other acute complaints.  Says he just wants to rest.   Disposition Plan & Communication   Status is: Inpatient  Remains inpatient appropriate because: Requires SNF placement at discharge, awaiting 10-day COVID isolation.  Dispo: The patient is from: Home              Anticipated d/c is to: SNF              Patient currently is medically stable to d/c.   Difficult to place patient No    Consults, Procedures, Significant Events   Consultants:  Cardiology Neurology  Procedures:  Echo  Antimicrobials:  Anti-infectives (From admission, onward)    Start     Dose/Rate Route Frequency Ordered Stop   04/17/21 2230  nirmatrelvir/ritonavir EUA (PAXLOVID) 3 tablet  Status:  Discontinued        3 tablet Oral 2 times daily 04/17/21 2218 04/17/21 2225         Micro    Objective   Vitals:   04/25/21 0341 04/25/21 0423 04/25/21 0822 04/25/21 1221  BP: (!) 183/72 (!) 176/79 (!) 157/75 (!) 166/70  Pulse: 80 91 79 74  Resp: '18  16 16  '$ Temp: 98.1 F (36.7 C)  98.2 F (36.8 C) 98.3 F (36.8 C)  TempSrc: Oral     SpO2: 98% (!) 72% 100% 96%  Weight:      Height:        Intake/Output Summary (Last 24 hours) at 04/25/2021 1520 Last data filed at 04/25/2021 1200 Gross per 24 hour  Intake --  Output 2825 ml  Net -2825 ml   Filed Weights   04/17/21 1639 04/17/21 1643  Weight: 90.7 kg 90.7 kg    Physical Exam:  General exam: Sleeping comfortably, woke to voice, seems mildly confused, no acute distress Respiratory system: CTAB, no wheezes, rales or rhonchi, normal respiratory effort.  On room air Cardiovascular system: normal S1/S2, RRR, no pedal edema.   Central nervous system: Grossly nonfocal exam, normal speech Extremities: moves all, no edema, normal tone Psychiatry: normal mood, congruent affect  Labs   Data Reviewed: I have personally reviewed following labs  and imaging studies  CBC: Recent Labs  Lab 04/19/21 0326 04/20/21 0604 04/21/21 0638 04/22/21 0446  WBC 8.3 7.3 8.0 8.0  NEUTROABS 4.9 4.3 4.6 4.2  HGB 14.9 14.1 14.2 14.8  HCT 42.4 39.6 40.9 41.9  MCV 82.3 83.7 83.8 83.6  PLT 208 178 181 [id number]   Basic Metabolic Panel: Recent Labs  Lab 04/19/21 0326 04/20/21 0604 04/21/21 0638 04/22/21 0446  NA 140 135 139 140  K 3.1* 3.9 3.7 4.1  CL 105 100 104 103  CO2 '28 26 29 31  '$ GLUCOSE 171* 302* 184* 177*  BUN '9 13 14 16  '$ CREATININE 0.87 0.86 0.72 0.85  CALCIUM 8.8* 8.8* 9.1 9.2  MG 2.2 2.0 2.1 2.3   GFR: Estimated Creatinine Clearance: 83.7 mL/min (by C-G formula based on SCr of 0.85 mg/dL). Liver Function Tests: Recent Labs  Lab  04/19/21 0326 04/20/21 0604 04/21/21 0638 04/22/21 0446  AST 15 17 14* 13*  ALT '11 12 13 13  '$ ALKPHOS 75 70 68 68  BILITOT 1.0 0.9 0.7 0.7  PROT 6.1* 5.7* 5.9* 6.1*  ALBUMIN 3.4* 3.1* 3.2* 3.3*   No results for input(s): LIPASE, AMYLASE in the last 168 hours. No results for input(s): AMMONIA in the last 168 hours. Coagulation Profile: No results for input(s): INR, PROTIME in the last 168 hours. Cardiac Enzymes: No results for input(s): CKTOTAL, CKMB, CKMBINDEX, TROPONINI in the last 168 hours. BNP (last 3 results) No results for input(s): PROBNP in the last 8760 hours. HbA1C: No results for input(s): HGBA1C in the last 72 hours. CBG: Recent Labs  Lab 04/24/21 1212 04/24/21 1652 04/24/21 2014 04/25/21 0821 04/25/21 1219  GLUCAP 167* 233* 258* 213* 223*   Lipid Profile: No results for input(s): CHOL, HDL, LDLCALC, TRIG, CHOLHDL, LDLDIRECT in the last 72 hours. Thyroid Function Tests: No results for input(s): TSH, T4TOTAL, FREET4, T3FREE, THYROIDAB in the last 72 hours. Anemia Panel: No results for input(s): VITAMINB12, FOLATE, FERRITIN, TIBC, IRON, RETICCTPCT in the last 72 hours. Sepsis Labs: No results for input(s): PROCALCITON, LATICACIDVEN in the last 168 hours.  Recent  Results (from the past 240 hour(s))  Resp Panel by RT-PCR (Flu A&B, Covid) Nasopharyngeal Swab     Status: Abnormal   Collection Time: 04/17/21  7:30 PM   Specimen: Nasopharyngeal Swab; Nasopharyngeal(NP) swabs in vial transport medium  Result Value Ref Range Status   SARS Coronavirus 2 by RT PCR POSITIVE (A) NEGATIVE Final    Comment: READ BACK AND VERIFIED BY Martinique LOCKEE, RN AT 2039 04/17/21 BY [facility] (NOTE) SARS-CoV-2 target nucleic acids are DETECTED.  The SARS-CoV-2 RNA is generally detectable in upper respiratory specimens during the acute phase of infection. Positive results are indicative of the presence of the identified virus, but do not rule out bacterial infection or co-infection with other pathogens not detected by the test. Clinical correlation with patient history and other diagnostic information is necessary to determine patient infection status. The expected result is Negative.  Fact Sheet for Patients: EntrepreneurPulse.com.au  Fact Sheet for Healthcare Providers: IncredibleEmployment.be  This test is not yet approved or cleared by the [place] FDA and  has been authorized for detection and/or diagnosis of SARS-CoV-2 by FDA under an Emergency Use Authorization (EUA).  This EUA will remain in effect (meaning this test can be used) for the dur ation of  the COVID-19 declaration under Section 564(b)(1) of the Act, 21 U.S.C. section 360bbb-3(b)(1), unless the authorization is terminated or revoked sooner.     Influenza A by PCR NEGATIVE NEGATIVE Final   Influenza B by PCR NEGATIVE NEGATIVE Final    Comment: (NOTE) The Xpert Xpress SARS-CoV-2/FLU/RSV plus assay is intended as an aid in the diagnosis of influenza from Nasopharyngeal swab specimens and should not be used as a sole basis for treatment. Nasal washings and aspirates are unacceptable for Xpert Xpress SARS-CoV-2/FLU/RSV testing.  Fact Sheet for  Patients: EntrepreneurPulse.com.au  Fact Sheet for Healthcare Providers: IncredibleEmployment.be  This test is not yet approved or cleared by the [place] FDA and has been authorized for detection and/or diagnosis of SARS-CoV-2 by FDA under an Emergency Use Authorization (EUA). This EUA will remain in effect (meaning this test can be used) for the duration of the COVID-19 declaration under Section 564(b)(1) of the Act, 21 U.S.C. section 360bbb-3(b)(1), unless the authorization is terminated or revoked.  Performed at [facility]  Lab, [place], [place] [address]       Imaging Studies   No results found.   Medications   Scheduled Meds:  amLODipine  10 mg Oral Daily   aspirin EC  81 mg Oral Daily   atorvastatin  80 mg Oral Daily   clopidogrel  75 mg Oral Daily   enalapril  5 mg Oral Daily   enoxaparin (LOVENOX) injection  40 mg Subcutaneous Q24H   insulin aspart  0-9 Units Subcutaneous TID WC   insulin aspart protamine- aspart  15 Units Subcutaneous BID AC & HS   Continuous Infusions:     LOS: 7 days    Time spent: 30 minutes with > 50% spent at bedside and in coordination of care     Ezekiel Slocumb, DO [facility]  04/25/2021, 3:20 PM      If 7PM-7AM, please contact night-coverage. How to contact the [facility] Attending or Consulting provider [place] or covering provider during after hours [place], for this patient?    Check the care team in [facility] and look for a) attending/consulting TR[facility] provider listed and b) the [facility] team listed Log into [email or web address] and use [place]'s universal password to access. If you do not have the password, please contact the hospital operator. Locate the [facility] provider you are looking for under [facility] and page to a number that you can be directly reached. If you still have difficulty reaching the provider, please page the [facility] (Director on Call) for the  Hospitalists listed on amion for assistance.

## 2021-04-25 NOTE — Progress Notes (Signed)
Physical Therapy Treatment Patient Details Name: Victor Fox MRN: [id number] DOB: 07-25-1946 Today's Date: 04/25/2021    History of Present Illness 75 y.o. male with a past medical history of significant diabetes mellitus type 2, hypertension, hyperlipidemia, neuropathy, vertigo, carotid artery disease, coronary artery disease, history of CVA, former smoker, resented to the hospital with c/o unsteainess/falls starting 8/13, MRI shows ischemic infarct.    PT Comments    Pt resting in room, agreed to PT session.  Pt able to transfer to EOB with MinA and use of railing.  Pt sat EOB x 15 minutes for lunch while reaching out of BOS and focusing on maintaining upright sitting balance.  Pt required assistance x 3 to prevent left lateral/posterior LOB. Reviewed LE strengthening exercises. Pt appears to be regaining strength and activity tolerance, continue to recommend SNF upon d/c to gain highest independent function.    Follow Up Recommendations  SNF;Supervision for mobility/OOB     Equipment Recommendations  None recommended by PT    Recommendations for Other Services       Precautions / Restrictions Precautions Precautions: Fall Restrictions Weight Bearing Restrictions: No    Mobility  Bed Mobility Overal bed mobility: Needs Assistance Bed Mobility: Supine to Sit     Supine to sit: Supervision;HOB elevated Sit to supine: Min assist   General bed mobility comments: increased time    Transfers Overall transfer level: Needs assistance Equipment used: Rolling walker (2 wheeled) Transfers: Sit to/from Stand Sit to Stand: Min guard         General transfer comment: vc's for safe technique  Ambulation/Gait                 Stairs             Wheelchair Mobility    Modified Rankin (Stroke Patients Only)       Balance Overall balance assessment: Needs assistance Sitting-balance support: Feet supported;No upper extremity supported Sitting  balance-Leahy Scale: Fair Sitting balance - Comments: supervision sitting EOB Postural control: Posterior lean;Left lateral lean                                  Cognition Arousal/Alertness: Awake/alert Behavior During Therapy: WFL for tasks assessed/performed Overall Cognitive Status: Within Functional Limits for tasks assessed Area of Impairment: Safety/judgement                         Safety/Judgement: Decreased awareness of safety;Decreased awareness of deficits     General Comments: Able to follow simple one step commands and most multi step      Exercises General Exercises - Lower Extremity Ankle Circles/Pumps: AROM;Both;15 reps Long Arc Quad: AROM;15 reps    General Comments        Pertinent Vitals/Pain Pain Assessment: No/denies pain    Home Living                      Prior Function            PT Goals (current goals can now be found in the care plan section) Acute Rehab PT Goals Patient Stated Goal: get stronger    Frequency    Min 2X/week      PT Plan Current plan remains appropriate    Co-evaluation              AM-PAC PT "6 Clicks" Mobility   Outcome  Measure  Help needed turning from your back to your side while in a flat bed without using bedrails?: A Little Help needed moving from lying on your back to sitting on the side of a flat bed without using bedrails?: A Little Help needed moving to and from a bed to a chair (including a wheelchair)?: A Little Help needed standing up from a chair using your arms (e.g., wheelchair or bedside chair)?: A Little Help needed to walk in hospital room?: A Little Help needed climbing 3-5 steps with a railing? : A Lot 6 Click Score: 17    End of Session Equipment Utilized During Treatment: Gait belt Activity Tolerance: Patient tolerated treatment well Patient left: in bed;with call bell/phone within reach;with chair alarm set Nurse Communication: Mobility  status PT Visit Diagnosis: Muscle weakness (generalized) (M62.81);Difficulty in walking, not elsewhere classified (R26.2);Unsteadiness on feet (R26.81)     Time:  -     Charges:  $Therapeutic Activity: 23-37 mins                    Mikel Cella, PTA    [name] 04/25/2021, 3:06 PM

## 2021-04-25 NOTE — Evaluation (Addendum)
Clinical/Bedside Swallow Evaluation Patient Details  Name: Victor Fox MRN: [id number] Date of Birth: 1946/02/17  Today's Date: 04/25/2021 Time: SLP Start Time (ACUTE ONLY): 1340 SLP Stop Time (ACUTE ONLY): 1440 SLP Time Calculation (min) (ACUTE ONLY): 60 min  Past Medical History:  Past Medical History:  Diagnosis Date   Arthritis    Benign neoplasm of unspecified site    Carotid artery disease ([place])    a. carotid duplex 05/2015: less tahn 39% stenosis bilaterally, stable over the past year, recommend repeat imaging in 2 years (05/2017)   Compression fracture    a. L2   CVA (cerebral vascular accident) ([place]) 06/2012   Diabetes mellitus without complication ([place])    Heart murmur    a. echo 06/2012: EF >55%, LVH, aortic valve sclerosis   Hyperlipidemia    Hypertension    Neuropathy    Pneumonia    Tobacco abuse    a. smoked x 25 years   Vitamin D deficiency    Past Surgical History:  Past Surgical History:  Procedure Laterality Date   ARTHRODESIS     BACK SURGERY     x 2   discectomy     trigger release     HPI:  75 y.o. male Covid Positive this admit with a past medical history of significant diabetes mellitus type 2, hypertension, hyperlipidemia, neuropathy, vertigo, carotid artery disease, coronary artery disease, history of CVA, former smoker, who presented to ED with complaint of dizziness. He is a very poor historian and unable to provide much more detail. Currently mildly dizzy without focal neurologic deficits. 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 Unsure of pt's baseline Cognitive status.  Being followed by PT/OT this admit.. Chest x-ray also negative for any acute disease at admit.   Assessment / Plan / Recommendation Clinical Impression  Pt appears to present w/ pharyngeal phase dysphagia; w/ adequate oral phase swallow function. Pharyngeal phase dysphagia noted  w/ thin liquid consistency, suspect neuromuscular deficits possibly stemming from recent CVA per MRI results. Pt appears at reduced risk for aspiration following general aspiration precautions and using a modified diet of Nectar consistency liquids.   During po trials, pt consumed thin liquids via Cup following aspiration precautions as educated on/monitored by this Clinician w/ consistent overt coughing both immediate and delayed post trials. Pt endorsed he coughed when eating/drinking at meals though he was not clear about distinguishing drinking of liquids vs eating foods. When given Nectar liquids via Cup and food consistencies, no overt coughing, decline in vocal quality, or change in respiratory presentation during/post trials. Oral phase appeared grossly [facility] w/ timely bolus management, mastication, and control of bolus propulsion for A-P transfer for swallowing. Pt is missing Upper Dentition stating he has "some teeth at home" but does not always wear them. Oral clearing achieved w/ all trial consistencies. OM Exam appeared [facility] w/ no unilateral weakness noted. Speech intelligible. Pt fed self w/ setup support.   Recommend a mech soft consistency diet w/ well-Cut meats, moistened foods; Nectar liquids VIA CUP. Recommend general aspiration precautions, Pills WHOLE in Puree for safer, easier swallowing. Education given on Pills in Puree; food consistencies and easy to eat options; general aspiration precautions. NSG/MD updated. ST services to f/u at his next venue of care; SNF. SLP Visit Diagnosis: Dysphagia, pharyngeal phase (R13.13)    Aspiration Risk  Mild aspiration risk;Moderate aspiration risk;Risk for inadequate nutrition/hydration    Diet Recommendation  mech soft consistency diet w/ well-Cut meats, moistened foods; Nectar liquids VIA CUP. Recommend general aspiration precautions.  Medication Administration: Whole meds with puree    Other  Recommendations Recommended Consults:  (Dietician  f/u) Oral Care Recommendations: Oral care BID;Oral care before and after PO;Staff/trained caregiver to provide oral care Other Recommendations: Order thickener from pharmacy;Prohibited food (jello, ice cream, thin soups);Remove water pitcher;Have oral suction available   Follow up Recommendations Skilled Nursing facility      Frequency and Duration   F/u at next venue of care as medical status improves overall         Prognosis Prognosis for Safe Diet Advancement: Fair Barriers to Reach Goals: Cognitive deficits;Time post onset;Severity of deficits      Swallow Study   General Date of Onset: 04/18/21 HPI: 75 y.o. male Covid Positive this admit with a past medical history of significant diabetes mellitus type 2, hypertension, hyperlipidemia, neuropathy, vertigo, carotid artery disease, coronary artery disease, history of CVA, former smoker, who presented to ED with complaint of dizziness. He is a very poor historian and unable to provide much more detail. Currently mildly dizzy without focal neurologic deficits. 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 Unsure of pt's baseline Cognitive status.  Being followed by PT/OT this admit.. Chest x-ray also negative for any acute disease at admit. Type of Study: Bedside Swallow Evaluation Previous Swallow Assessment: none - reports of coughing w/ thin liquids this admit Diet Prior to this Study: Regular;Thin liquids Temperature Spikes Noted: No (wbc 8.0) Respiratory Status: Room air History of Recent Intubation: No Behavior/Cognition: Alert;Cooperative;Pleasant mood Oral Cavity Assessment: Within Functional Limits Oral Care Completed by SLP: Yes Oral Cavity - Dentition: Missing dentition (Uppers; native bottom dentition - missing molars) Vision: Functional for self-feeding Self-Feeding Abilities: Able to feed self;Needs set up Patient Positioning:  Upright in bed (needed positioning) Baseline Vocal Quality: Normal (adequate) Volitional Cough: Strong Volitional Swallow: Able to elicit    Oral/Motor/Sensory Function Overall Oral Motor/Sensory Function: Within functional limits   Ice Chips Ice chips: Within functional limits Presentation: Spoon (fed; 3 trials)   Thin Liquid Thin Liquid: Impaired Presentation: Cup;Self Fed (10-12 trials) Oral Phase Impairments:  (none) Oral Phase Functional Implications:  (none) Pharyngeal  Phase Impairments: Cough - Immediate;Cough - Delayed (consistent)    Nectar Thick Nectar Thick Liquid: Within functional limits Presentation: Cup;Self Fed (4 ozs)   Honey Thick Honey Thick Liquid: Not tested   Puree Puree: Within functional limits Presentation: Self Fed;Spoon (4 ozs)   Solid     Solid: Within functional limits (moistened) Presentation: Self Fed (8 trials)        Orinda Kenner, MS, CCC-SLP Speech Language Pathologist Rehab Services [phone] Anil Havard 04/25/2021,5:40 PM

## 2021-04-26 DIAGNOSIS — I639 Cerebral infarction, unspecified: Secondary | ICD-10-CM | POA: Diagnosis not present

## 2021-04-26 LAB — GLUCOSE, CAPILLARY
Glucose-Capillary: 139 mg/dL — ABNORMAL HIGH (ref 70–99)
Glucose-Capillary: 149 mg/dL — ABNORMAL HIGH (ref 70–99)
Glucose-Capillary: 181 mg/dL — ABNORMAL HIGH (ref 70–99)
Glucose-Capillary: 155 mg/dL — ABNORMAL HIGH (ref 70–99)

## 2021-04-26 MED ORDER — INSULIN ASPART PROT & ASPART (70-30 MIX) 100 UNIT/ML ~~LOC~~ SUSP
15.0000 [IU] | Freq: Two times a day (BID) | SUBCUTANEOUS | Status: DC
  Administered 2021-04-26 – 2021-05-01 (×10): 15 [IU] via SUBCUTANEOUS
  Filled 2021-04-26: qty 10

## 2021-04-26 NOTE — Progress Notes (Signed)
PROGRESS NOTE    Victor Fox   [id number]  DOB: [birth year]  PCP: Patient, No Pcp Per (Inactive)    DOA: 04/17/2021 LOS: 8    Brief Narrative / Hospital Course to Date:   Per HPI from H&P - "Victor Fox is a 75 y.o. male with a past medical history of significant diabetes mellitus type 2, hypertension, hyperlipidemia, neuropathy, vertigo, carotid artery disease, coronary artery disease, history of CVA, former smoker, resented to the hospital with chief complaints of dizziness upon sitting up and standing, in the ER initial work-up negative follow-up MRI shows ischemic infarcts along with loud systolic murmur.  He was admitted for further care."     Medically stable for SNF discharge, needs to finish 10-day quarantine on 04/27/2021.  Assessment & Plan   Principal Problem:   Acute CVA (cerebrovascular accident) [facility]) Active Problems:   Near syncope   Self-care deficit   Lab test positive for detection of COVID-19 virus   Acute on chronic dizziness -with positive orthostatics and postural component.  Does have acute CVA however dizziness seems more consistent with orthostatic, insetting of aortic stenosis.  Improved with BP stabilization. 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   No prior history of cardiac arrhythmias. Echo showed EF 60 to 65%, moderate LVH, grade 1 diastolic dysfunction moderate to severe aortic stenosis. Neurology and cardiology were consulted. Continue DAPT with aspirin Plavix x3 weeks, aspirin monotherapy thereafter. Continue statin. Monitor on telemetry. Cardiology follow-up for Zio patch. PT and OT recommend SNF, placement pending once outside COVID isolation.  COVID-19 infection -incidental positive.  Patient has no respiratory symptoms.  Stable inflammatory markers.  Possibly contributed to his acute CVA. Monitor  clinically.  Dysphagia -showing signs of aspiration with thin liquids.  Seen by SLP for swallow evaluation.  Now on nectar thickened liquids. -- Aspiration precautions -- Diet per SLP recs  Hypertension with orthostatic hypotension - 8/25 BPs remain uncontrolled.   Continue on Norvasc 10 mg. Low-dose enalapril added 8/22. Increased enalapril to 10 mg. Monitor closely for orthostatic symptoms.   ?  A flutter versus artifact (see initial EKG from the ED).  Subsequent EKG was sinus rhythm.  Cardiology feels this was artifact.  TSH normal.   Monitor on telemetry. Echo results as above.  Moderate to severe aortic stenosis -outpatient cardiology follow-up.  Avoid drops in BP and preload.  Dyslipidemia -on statin  History of CAD -stable, no active chest pain or acute ischemic EKG changes.  Continue aspirin and Plavix  Chronic diastolic CHF -euvolemic and compensated.  Monitor volume status  Type 2 diabetes -continue on 70/30 and sliding scale NovoLog.  8/25: Increased 70/30 to 15 units twice daily with meals.  Continue sensitive sliding scale NovoLog (0-9) TID WC    Patient BMI: Body mass index is 27.12 kg/m.   DVT prophylaxis: enoxaparin (LOVENOX) injection 40 mg Start: 04/17/21 2200   Diet:  Diet Orders (From admission, onward)     Start     Ordered   04/25/21 1430  DIET DYS 3 Room service appropriate? Yes with Assist; Fluid consistency: Nectar Thick  Diet effective now       Comments: Please add extra gravy on meats, potatoes.  Likes meatloaf and pintos w/ mashed potatoes.  Question Answer Comment  Room service appropriate? Yes with Assist   Fluid consistency: Nectar Thick      04/25/21 1433  Code Status: Full Code   Subjective 04/26/21    Patient awake but appears drowsy this morning.  No acute events reported overnight.  He says he is feeling fine and denies shortness of breath or chest pain, cough or sore throat, abdominal pain nausea or  vomiting.   Disposition Plan & Communication   Status is: Inpatient  Remains inpatient appropriate because: Requires SNF placement at discharge, awaiting 10-day COVID isolation.  Dispo: The patient is from: Home              Anticipated d/c is to: SNF              Patient currently is medically stable to d/c.   Difficult to place patient No    Consults, Procedures, Significant Events   Consultants:  Cardiology Neurology  Procedures:  Echo  Antimicrobials:  Anti-infectives (From admission, onward)    Start     Dose/Rate Route Frequency Ordered Stop   04/17/21 2230  nirmatrelvir/ritonavir EUA (PAXLOVID) 3 tablet  Status:  Discontinued        3 tablet Oral 2 times daily 04/17/21 2218 04/17/21 2225         Micro    Objective   Vitals:   04/26/21 0510 04/26/21 0830 04/26/21 1300 04/26/21 1608  BP: (!) 142/69 (!) 156/75 (!) 145/64 128/65  Pulse: 63 (!) 57 (!) 59 (!) 59  Resp: '18 16 15 18  '$ Temp: 97.9 F (36.6 C) 97.9 F (36.6 C) 98.6 F (37 C) 97.6 F (36.4 C)  TempSrc: Oral Oral Oral Oral  SpO2: 99% 97% 97% 98%  Weight:      Height:        Intake/Output Summary (Last 24 hours) at 04/26/2021 1622 Last data filed at 04/26/2021 1425 Gross per 24 hour  Intake --  Output 1550 ml  Net -1550 ml   Filed Weights   04/17/21 1639 04/17/21 1643  Weight: 90.7 kg 90.7 kg    Physical Exam:  General exam: awake, appears drowsy,, no acute distress Respiratory system: normal respiratory effort.  On room air Cardiovascular system: normal S1/S2, RRR, no pedal edema.   GI: soft and non-tender abdomen Neuro: normal speech, no gross focal deficits Extremities: moves all, no edema, normal tone   Labs   Data Reviewed: I have personally reviewed following labs and imaging studies  CBC: Recent Labs  Lab 04/20/21 0604 04/21/21 0638 04/22/21 0446  WBC 7.3 8.0 8.0  NEUTROABS 4.3 4.6 4.2  HGB 14.1 14.2 14.8  HCT 39.6 40.9 41.9  MCV 83.7 83.8 83.6  PLT 178 181 [id number]    Basic Metabolic Panel: Recent Labs  Lab 04/20/21 0604 04/21/21 0638 04/22/21 0446  NA 135 139 140  K 3.9 3.7 4.1  CL 100 104 103  CO2 '26 29 31  '$ GLUCOSE 302* 184* 177*  BUN '13 14 16  '$ CREATININE 0.86 0.72 0.85  CALCIUM 8.8* 9.1 9.2  MG 2.0 2.1 2.3   GFR: Estimated Creatinine Clearance: 83.7 mL/min (by C-G formula based on SCr of 0.85 mg/dL). Liver Function Tests: Recent Labs  Lab 04/20/21 0604 04/21/21 0638 04/22/21 0446  AST 17 14* 13*  ALT '12 13 13  '$ ALKPHOS 70 68 68  BILITOT 0.9 0.7 0.7  PROT 5.7* 5.9* 6.1*  ALBUMIN 3.1* 3.2* 3.3*   No results for input(s): LIPASE, AMYLASE in the last 168 hours. No results for input(s): AMMONIA in the last 168 hours. Coagulation Profile: No results for input(s): INR, PROTIME in the last 168  hours. Cardiac Enzymes: No results for input(s): CKTOTAL, CKMB, CKMBINDEX, TROPONINI in the last 168 hours. BNP (last 3 results) No results for input(s): PROBNP in the last 8760 hours. HbA1C: No results for input(s): HGBA1C in the last 72 hours. CBG: Recent Labs  Lab 04/25/21 1219 04/25/21 1537 04/25/21 2216 04/26/21 0921 04/26/21 1229  GLUCAP 223* 167* 286* 139* 149*   Lipid Profile: No results for input(s): CHOL, HDL, LDLCALC, TRIG, CHOLHDL, LDLDIRECT in the last 72 hours. Thyroid Function Tests: No results for input(s): TSH, T4TOTAL, FREET4, T3FREE, THYROIDAB in the last 72 hours. Anemia Panel: No results for input(s): VITAMINB12, FOLATE, FERRITIN, TIBC, IRON, RETICCTPCT in the last 72 hours. Sepsis Labs: No results for input(s): PROCALCITON, LATICACIDVEN in the last 168 hours.  Recent Results (from the past 240 hour(s))  Resp Panel by RT-PCR (Flu A&B, Covid) Nasopharyngeal Swab     Status: Abnormal   Collection Time: 04/17/21  7:30 PM   Specimen: Nasopharyngeal Swab; Nasopharyngeal(NP) swabs in vial transport medium  Result Value Ref Range Status   SARS Coronavirus 2 by RT PCR POSITIVE (A) NEGATIVE Final    Comment: READ  BACK AND VERIFIED BY Martinique LOCKEE, RN AT 2039 04/17/21 BY [facility] (NOTE) SARS-CoV-2 target nucleic acids are DETECTED.  The SARS-CoV-2 RNA is generally detectable in upper respiratory specimens during the acute phase of infection. Positive results are indicative of the presence of the identified virus, but do not rule out bacterial infection or co-infection with other pathogens not detected by the test. Clinical correlation with patient history and other diagnostic information is necessary to determine patient infection status. The expected result is Negative.  Fact Sheet for Patients: EntrepreneurPulse.com.au  Fact Sheet for Healthcare Providers: IncredibleEmployment.be  This test is not yet approved or cleared by the [place] FDA and  has been authorized for detection and/or diagnosis of SARS-CoV-2 by FDA under an Emergency Use Authorization (EUA).  This EUA will remain in effect (meaning this test can be used) for the dur ation of  the COVID-19 declaration under Section 564(b)(1) of the Act, 21 U.S.C. section 360bbb-3(b)(1), unless the authorization is terminated or revoked sooner.     Influenza A by PCR NEGATIVE NEGATIVE Final   Influenza B by PCR NEGATIVE NEGATIVE Final    Comment: (NOTE) The Xpert Xpress SARS-CoV-2/FLU/RSV plus assay is intended as an aid in the diagnosis of influenza from Nasopharyngeal swab specimens and should not be used as a sole basis for treatment. Nasal washings and aspirates are unacceptable for Xpert Xpress SARS-CoV-2/FLU/RSV testing.  Fact Sheet for Patients: EntrepreneurPulse.com.au  Fact Sheet for Healthcare Providers: IncredibleEmployment.be  This test is not yet approved or cleared by the [place] FDA and has been authorized for detection and/or diagnosis of SARS-CoV-2 by FDA under an Emergency Use Authorization (EUA). This EUA will remain in effect (meaning  this test can be used) for the duration of the COVID-19 declaration under Section 564(b)(1) of the Act, 21 U.S.C. section 360bbb-3(b)(1), unless the authorization is terminated or revoked.  Performed at [facility], [address] [place], [place] [address]       Imaging Studies   No results found.   Medications   Scheduled Meds:  amLODipine  10 mg Oral Daily   aspirin EC  81 mg Oral Daily   atorvastatin  80 mg Oral Daily   clopidogrel  75 mg Oral Daily   enalapril  10 mg Oral Daily   enoxaparin (LOVENOX) injection  40 mg Subcutaneous Q24H  insulin aspart  0-9 Units Subcutaneous TID WC   insulin aspart protamine- aspart  15 Units Subcutaneous BID WC   Continuous Infusions:     LOS: 8 days    Time spent: 25 minutes with > 50% spent at bedside and in coordination of care     Ezekiel Slocumb, DO [facility]  04/26/2021, 4:22 PM      If 7PM-7AM, please contact night-coverage. How to contact the [facility] Attending or Consulting provider [place] or covering provider during after hours [place], for this patient?    Check the care team in [facility] and look for a) attending/consulting TR[facility] provider listed and b) the [facility] team listed Log into [email or web address] and use [place]'s universal password to access. If you do not have the password, please contact the hospital operator. Locate the [facility] provider you are looking for under [facility] and page to a number that you can be directly reached. If you still have difficulty reaching the provider, please page the [facility] (Director on Call) for the Hospitalists listed on amion for assistance.

## 2021-04-26 NOTE — Care Management Important Message (Signed)
Important Message  Patient Details  Name: Victor Fox MRN: [id number] Date of Birth: 1946-05-08   Medicare Important Message Given:  Other (see comment)  Patient is in an isolation room so I called his room [phone]) a couple of times to review the Important Message from Medicare but there was no answer. Will try again before I leave today.  Juliann Pulse A Ronte Parker 04/26/2021, 2:17 PM

## 2021-04-26 NOTE — TOC Progression Note (Addendum)
Transition of Care [facility]) - Progression Note    Patient Details  Name: Victor Fox MRN: [id number] Date of Birth: [birth year]  Transition of Care [facility]) CM/SW [place], RN Phone Number: 04/26/2021, 1:58 PM  Clinical Narrative:    Patient notified RN he did not have a preference of which SNF and Jiles Garter, RN CM left voicemail with APS worker updating. This Probation officer, RN CM outreached to [facility] to confirm bed offer still available. Awaiting reply. Off COVID quarantine Saturday.         Expected Discharge Plan and Services                                                 Social Determinants of Health (SDOH) Interventions    Readmission Risk Interventions No flowsheet data found.

## 2021-04-26 NOTE — TOC Progression Note (Signed)
Transition of Care [facility]) - Progression Note    Patient Details  Name: DESMINE SATURNO MRN: [id number] Date of Birth: 1946-07-04  Transition of Care [facility]) CM/SW [place], RN Phone Number: 04/26/2021, 9:40 AM  Clinical Narrative:   Patient has bed offers from [facility] and [facility].  He would like to view medicare ratings and call a friend prior to deciding.  Patient provided with medicare ratings.  Patient is still in quarantine, will be discharged when quarantine for COVID ends, and patient is medically ready to discharge.  TOC contact information given, TOC to follow to discharge.         Expected Discharge Plan and Services                                                 Social Determinants of Health (SDOH) Interventions    Readmission Risk Interventions No flowsheet data found.

## 2021-04-26 NOTE — Plan of Care (Signed)
  Problem: Respiratory: Goal: Will maintain a patent airway Outcome: Progressing  Patient continues to have strong, non-productive cough, clear speech, no SOB noted

## 2021-04-27 DIAGNOSIS — I639 Cerebral infarction, unspecified: Secondary | ICD-10-CM | POA: Diagnosis not present

## 2021-04-27 LAB — MAGNESIUM: Magnesium: 2.3 mg/dL (ref 1.7–2.4)

## 2021-04-27 LAB — CBC
HCT: 42.7 % (ref 39.0–52.0)
Hemoglobin: 14.8 g/dL (ref 13.0–17.0)
MCH: 29.2 pg (ref 26.0–34.0)
MCHC: 34.7 g/dL (ref 30.0–36.0)
MCV: 84.4 fL (ref 80.0–100.0)
Platelets: 171 10*3/uL (ref 150–400)
RBC: 5.06 MIL/uL (ref 4.22–5.81)
RDW: 13.4 % (ref 11.5–15.5)
WBC: 9 10*3/uL (ref 4.0–10.5)
nRBC: 0 % (ref 0.0–0.2)

## 2021-04-27 LAB — BASIC METABOLIC PANEL
Anion gap: 5 (ref 5–15)
BUN: 21 mg/dL (ref 8–23)
CO2: 31 mmol/L (ref 22–32)
Calcium: 9.2 mg/dL (ref 8.9–10.3)
Chloride: 103 mmol/L (ref 98–111)
Creatinine, Ser: 0.9 mg/dL (ref 0.61–1.24)
GFR, Estimated: >60 mL/min (ref >60)
Glucose, Bld: 84 mg/dL (ref 70–99)
Potassium: 4.5 mmol/L (ref 3.5–5.1)
Sodium: 139 mmol/L (ref 135–145)

## 2021-04-27 LAB — GLUCOSE, CAPILLARY
Glucose-Capillary: 89 mg/dL (ref 70–99)
Glucose-Capillary: 96 mg/dL (ref 70–99)
Glucose-Capillary: 175 mg/dL — ABNORMAL HIGH (ref 70–99)
Glucose-Capillary: 194 mg/dL — ABNORMAL HIGH (ref 70–99)

## 2021-04-27 NOTE — Progress Notes (Signed)
PROGRESS NOTE    Victor Fox   [id number]  DOB: 12/12/1945  PCP: Patient, No Pcp Per (Inactive)    DOA: 04/17/2021 LOS: 9    Brief Narrative / Hospital Course to Date:   Per HPI from H&P - "RALPHY KALICH is a 75 y.o. male with a past medical history of significant diabetes mellitus type 2, hypertension, hyperlipidemia, neuropathy, vertigo, carotid artery disease, coronary artery disease, history of CVA, former smoker, resented to the hospital with chief complaints of dizziness upon sitting up and standing, in the ER initial work-up negative follow-up MRI shows ischemic infarcts along with loud systolic murmur.  He was admitted for further care."     Medically stable for SNF discharge, needs to finish 10-day quarantine on 04/27/2021.  Assessment & Plan   Principal Problem:   Acute CVA (cerebrovascular accident) [facility]) Active Problems:   Near syncope   Self-care deficit   Lab test positive for detection of COVID-19 virus   Acute on chronic dizziness -with positive orthostatics and postural component.  Does have acute CVA however dizziness seems more consistent with orthostatic, insetting of aortic stenosis.  Improved with BP stabilization. 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   No prior history of cardiac arrhythmias. Echo showed EF 60 to 65%, moderate LVH, grade 1 diastolic dysfunction moderate to severe aortic stenosis. Neurology and cardiology were consulted. Continue DAPT with aspirin Plavix x3 weeks, aspirin monotherapy thereafter. Continue statin. Monitor on telemetry. Cardiology follow-up for Zio patch. PT and OT recommend SNF, placement pending once outside COVID isolation.  COVID-19 infection -incidental positive.  Patient has no respiratory symptoms.  Stable inflammatory markers.  Possibly contributed to his acute CVA. Monitor  clinically.  Dysphagia -showing signs of aspiration with thin liquids.  Seen by SLP for swallow evaluation.  Now on nectar thickened liquids. -- Aspiration precautions -- Diet per SLP recs  Hypertension with orthostatic hypotension - 8/25 BPs remain uncontrolled.   Continue on Norvasc 10 mg. Low-dose enalapril added 8/22. Increased enalapril to 10 mg. Monitor closely for orthostatic symptoms.   ?  A flutter versus artifact (see initial EKG from the ED).  Subsequent EKG was sinus rhythm.  Cardiology feels this was artifact.  TSH normal.   Monitor on telemetry. Echo results as above.  Moderate to severe aortic stenosis -outpatient cardiology follow-up.  Avoid drops in BP and preload.  Dyslipidemia -on statin  History of CAD -stable, no active chest pain or acute ischemic EKG changes.  Continue aspirin and Plavix  Chronic diastolic CHF -euvolemic and compensated.  Monitor volume status  Type 2 diabetes -continue on 70/30 and sliding scale NovoLog.  8/25: Increased 70/30 to 15 units twice daily with meals.  Continue sensitive sliding scale NovoLog (0-9) TID WC    Patient BMI: Body mass index is 27.12 kg/m.   DVT prophylaxis: enoxaparin (LOVENOX) injection 40 mg Start: 04/17/21 2200   Diet:  Diet Orders (From admission, onward)     Start     Ordered   04/25/21 1430  DIET DYS 3 Room service appropriate? Yes with Assist; Fluid consistency: Nectar Thick  Diet effective now       Comments: Please add extra gravy on meats, potatoes.  Likes meatloaf and pintos w/ mashed potatoes.  Question Answer Comment  Room service appropriate? Yes with Assist   Fluid consistency: Nectar Thick      04/25/21 1433  Code Status: Full Code   Subjective 04/27/21    Patient more awake and alert when seen today.  Reports he feels well.  Says breakfast was not good and asking for a snack.  No acute complaints.   Disposition Plan & Communication   Status is:  Inpatient  Remains inpatient appropriate because: Requires SNF placement at discharge, awaiting 10-day COVID isolation.  Dispo: The patient is from: Home              Anticipated d/c is to: SNF              Patient currently is medically stable to d/c.   Difficult to place patient No    Consults, Procedures, Significant Events   Consultants:  Cardiology Neurology  Procedures:  Echo  Antimicrobials:  Anti-infectives (From admission, onward)    Start     Dose/Rate Route Frequency Ordered Stop   04/17/21 2230  nirmatrelvir/ritonavir EUA (PAXLOVID) 3 tablet  Status:  Discontinued        3 tablet Oral 2 times daily 04/17/21 2218 04/17/21 2225         Micro    Objective   Vitals:   04/27/21 0604 04/27/21 0805 04/27/21 1051 04/27/21 1221  BP: (!) 130/56 (!) 160/70 138/61 (!) 112/92  Pulse: (!) 55 63 60 (!) 56  Resp: '16 18  19  '$ Temp: 98 F (36.7 C) 97.8 F (36.6 C)  98.1 F (36.7 C)  TempSrc:      SpO2: 100% 99%  98%  Weight:      Height:        Intake/Output Summary (Last 24 hours) at 04/27/2021 1543 Last data filed at 04/27/2021 1245 Gross per 24 hour  Intake --  Output 1075 ml  Net -1075 ml   Filed Weights   04/17/21 1639 04/17/21 1643  Weight: 90.7 kg 90.7 kg    Physical Exam:  General exam: awake, alert, talkative and in good spirits, no acute distress Respiratory system: normal respiratory effort.  On room air Cardiovascular system: normal S1/S2, RRR, no pedal edema.   GI: soft and non-tender abdomen Neuro: normal speech, no gross focal deficits    Labs   Data Reviewed: I have personally reviewed following labs and imaging studies  CBC: Recent Labs  Lab 04/21/21 0638 04/22/21 0446 04/27/21 0428  WBC 8.0 8.0 9.0  NEUTROABS 4.6 4.2  --   HGB 14.2 14.8 14.8  HCT 40.9 41.9 42.7  MCV 83.8 83.6 84.4  PLT 181 194 [id number]   Basic Metabolic Panel: Recent Labs  Lab 04/21/21 0638 04/22/21 0446 04/27/21 0428  NA 139 140 139  K 3.7 4.1 4.5   CL 104 103 103  CO2 '29 31 31  '$ GLUCOSE 184* 177* 84  BUN '14 16 21  '$ CREATININE 0.72 0.85 0.90  CALCIUM 9.1 9.2 9.2  MG 2.1 2.3 2.3   GFR: Estimated Creatinine Clearance: 79 mL/min (by C-G formula based on SCr of 0.9 mg/dL). Liver Function Tests: Recent Labs  Lab 04/21/21 0638 04/22/21 0446  AST 14* 13*  ALT 13 13  ALKPHOS 68 68  BILITOT 0.7 0.7  PROT 5.9* 6.1*  ALBUMIN 3.2* 3.3*   No results for input(s): LIPASE, AMYLASE in the last 168 hours. No results for input(s): AMMONIA in the last 168 hours. Coagulation Profile: No results for input(s): INR, PROTIME in the last 168 hours. Cardiac Enzymes: No results for input(s): CKTOTAL, CKMB, CKMBINDEX, TROPONINI in the last 168 hours. BNP (last 3 results) No  results for input(s): PROBNP in the last 8760 hours. HbA1C: No results for input(s): HGBA1C in the last 72 hours. CBG: Recent Labs  Lab 04/26/21 1229 04/26/21 1709 04/26/21 2102 04/27/21 0807 04/27/21 1222  GLUCAP 149* 181* 155* 89 96   Lipid Profile: No results for input(s): CHOL, HDL, LDLCALC, TRIG, CHOLHDL, LDLDIRECT in the last 72 hours. Thyroid Function Tests: No results for input(s): TSH, T4TOTAL, FREET4, T3FREE, THYROIDAB in the last 72 hours. Anemia Panel: No results for input(s): VITAMINB12, FOLATE, FERRITIN, TIBC, IRON, RETICCTPCT in the last 72 hours. Sepsis Labs: No results for input(s): PROCALCITON, LATICACIDVEN in the last 168 hours.  Recent Results (from the past 240 hour(s))  Resp Panel by RT-PCR (Flu A&B, Covid) Nasopharyngeal Swab     Status: Abnormal   Collection Time: 04/17/21  7:30 PM   Specimen: Nasopharyngeal Swab; Nasopharyngeal(NP) swabs in vial transport medium  Result Value Ref Range Status   SARS Coronavirus 2 by RT PCR POSITIVE (A) NEGATIVE Final    Comment: READ BACK AND VERIFIED BY Martinique LOCKEE, RN AT 2039 04/17/21 BY [facility] (NOTE) SARS-CoV-2 target nucleic acids are DETECTED.  The SARS-CoV-2 RNA is generally detectable in upper  respiratory specimens during the acute phase of infection. Positive results are indicative of the presence of the identified virus, but do not rule out bacterial infection or co-infection with other pathogens not detected by the test. Clinical correlation with patient history and other diagnostic information is necessary to determine patient infection status. The expected result is Negative.  Fact Sheet for Patients: EntrepreneurPulse.com.au  Fact Sheet for Healthcare Providers: IncredibleEmployment.be  This test is not yet approved or cleared by the [place] FDA and  has been authorized for detection and/or diagnosis of SARS-CoV-2 by FDA under an Emergency Use Authorization (EUA).  This EUA will remain in effect (meaning this test can be used) for the dur ation of  the COVID-19 declaration under Section 564(b)(1) of the Act, 21 U.S.C. section 360bbb-3(b)(1), unless the authorization is terminated or revoked sooner.     Influenza A by PCR NEGATIVE NEGATIVE Final   Influenza B by PCR NEGATIVE NEGATIVE Final    Comment: (NOTE) The Xpert Xpress SARS-CoV-2/FLU/RSV plus assay is intended as an aid in the diagnosis of influenza from Nasopharyngeal swab specimens and should not be used as a sole basis for treatment. Nasal washings and aspirates are unacceptable for Xpert Xpress SARS-CoV-2/FLU/RSV testing.  Fact Sheet for Patients: EntrepreneurPulse.com.au  Fact Sheet for Healthcare Providers: IncredibleEmployment.be  This test is not yet approved or cleared by the [place] FDA and has been authorized for detection and/or diagnosis of SARS-CoV-2 by FDA under an Emergency Use Authorization (EUA). This EUA will remain in effect (meaning this test can be used) for the duration of the COVID-19 declaration under Section 564(b)(1) of the Act, 21 U.S.C. section 360bbb-3(b)(1), unless the authorization is  terminated or revoked.  Performed at [facility], [address], [place], [place] [address]       Imaging Studies   No results found.   Medications   Scheduled Meds:  amLODipine  10 mg Oral Daily   aspirin EC  81 mg Oral Daily   atorvastatin  80 mg Oral Daily   clopidogrel  75 mg Oral Daily   enalapril  10 mg Oral Daily   enoxaparin (LOVENOX) injection  40 mg Subcutaneous Q24H   insulin aspart  0-9 Units Subcutaneous TID WC   insulin aspart protamine- aspart  15 Units Subcutaneous BID WC  Continuous Infusions:     LOS: 9 days    Time spent: 20 minutes     Ezekiel Slocumb, DO [facility]  04/27/2021, 3:43 PM      If 7PM-7AM, please contact night-coverage. How to contact the [facility] Attending or Consulting provider [place] or covering provider during after hours [place], for this patient?    Check the care team in [facility] and look for a) attending/consulting TRH provider listed and b) the [facility] team listed Log into [email or web address] and use [place]'s universal password to access. If you do not have the password, please contact the hospital operator. Locate the [facility] provider you are looking for under [facility] and page to a number that you can be directly reached. If you still have difficulty reaching the provider, please page the [facility] (Director on Call) for the Hospitalists listed on amion for assistance.

## 2021-04-28 DIAGNOSIS — I639 Cerebral infarction, unspecified: Secondary | ICD-10-CM | POA: Diagnosis not present

## 2021-04-28 LAB — GLUCOSE, CAPILLARY
Glucose-Capillary: 185 mg/dL — ABNORMAL HIGH (ref 70–99)
Glucose-Capillary: 120 mg/dL — ABNORMAL HIGH (ref 70–99)
Glucose-Capillary: 227 mg/dL — ABNORMAL HIGH (ref 70–99)
Glucose-Capillary: 253 mg/dL — ABNORMAL HIGH (ref 70–99)

## 2021-04-28 NOTE — Progress Notes (Signed)
PROGRESS NOTE    Victor Fox   [id number]  DOB: 1945/10/18  PCP: Patient, No Pcp Per (Inactive)    DOA: 04/17/2021 LOS: 74    Brief Narrative / Hospital Course to Date:   Per HPI from H&P - "Victor Fox is a 75 y.o. male with a past medical history of significant diabetes mellitus type 2, hypertension, hyperlipidemia, neuropathy, vertigo, carotid artery disease, coronary artery disease, history of CVA, former smoker, resented to the hospital with chief complaints of dizziness upon sitting up and standing, in the ER initial work-up negative follow-up MRI shows ischemic infarcts along with loud systolic murmur.  He was admitted for further care."     Medically stable for SNF discharge, needs to finish 10-day quarantine on 04/27/2021.  Assessment & Plan   Principal Problem:   Acute CVA (cerebrovascular accident) [facility]) Active Problems:   Near syncope   Self-care deficit   Lab test positive for detection of COVID-19 virus   Acute on chronic dizziness -with positive orthostatics and postural component.  Does have acute CVA however dizziness seems more consistent with orthostatic, insetting of aortic stenosis.  Improved with BP stabilization. 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   No prior history of cardiac arrhythmias. Echo showed EF 60 to 65%, moderate LVH, grade 1 diastolic dysfunction moderate to severe aortic stenosis. Neurology and cardiology were consulted. Continue DAPT with aspirin Plavix x3 weeks, aspirin monotherapy thereafter. Continue statin. Monitor on telemetry. Cardiology follow-up for Zio patch. PT and OT recommend SNF, placement pending once outside COVID isolation.  COVID-19 infection -incidental positive.  Patient has no respiratory symptoms.  Stable inflammatory markers.  Possibly contributed to his acute CVA. Monitor  clinically.  Dysphagia -showing signs of aspiration with thin liquids.  Seen by SLP for swallow evaluation.  Now on nectar thickened liquids. -- Aspiration precautions -- Diet per SLP recs  Hypertension with orthostatic hypotension - 8/25 BPs remain uncontrolled.   Continue on Norvasc 10 mg. Low-dose enalapril added 8/22. Increased enalapril to 10 mg. Monitor closely for orthostatic symptoms.   ?  A flutter versus artifact (see initial EKG from the ED).  Subsequent EKG was sinus rhythm.  Cardiology feels this was artifact.  TSH normal.   Monitor on telemetry. Echo results as above.  Moderate to severe aortic stenosis -outpatient cardiology follow-up.  Avoid drops in BP and preload.  Dyslipidemia -on statin  History of CAD -stable, no active chest pain or acute ischemic EKG changes.  Continue aspirin and Plavix  Chronic diastolic CHF -euvolemic and compensated.  Monitor volume status  Type 2 diabetes -continue on 70/30 and sliding scale NovoLog.  8/25: Increased 70/30 to 15 units twice daily with meals.  Continue sensitive sliding scale NovoLog (0-9) TID WC    Patient BMI: Body mass index is 27.12 kg/m.   DVT prophylaxis: enoxaparin (LOVENOX) injection 40 mg Start: 04/17/21 2200   Diet:  Diet Orders (From admission, onward)     Start     Ordered   04/25/21 1430  DIET DYS 3 Room service appropriate? Yes with Assist; Fluid consistency: Nectar Thick  Diet effective now       Comments: Please add extra gravy on meats, potatoes.  Likes meatloaf and pintos w/ mashed potatoes.  Question Answer Comment  Room service appropriate? Yes with Assist   Fluid consistency: Nectar Thick      04/25/21 1433  Code Status: Full Code   Subjective 04/28/21    Patient more awake and alert when seen today.  He is waiting for his breakfast, say he's hungry.  Says a friend told him they'd been at [facility] before and said good things. Denies any acute complaints.   Disposition  Plan & Communication   Status is: Inpatient  Remains inpatient appropriate because: Requires SNF placement at discharge, awaiting 10-day COVID isolation.  Dispo: The patient is from: Home              Anticipated d/c is to: SNF              Patient currently is medically stable to d/c.   Difficult to place patient No    Consults, Procedures, Significant Events   Consultants:  Cardiology Neurology  Procedures:  Echo  Antimicrobials:  Anti-infectives (From admission, onward)    Start     Dose/Rate Route Frequency Ordered Stop   04/17/21 2230  nirmatrelvir/ritonavir EUA (PAXLOVID) 3 tablet  Status:  Discontinued        3 tablet Oral 2 times daily 04/17/21 2218 04/17/21 2225         Micro    Objective   Vitals:   04/27/21 2146 04/28/21 0538 04/28/21 1100 04/28/21 1250  BP: 131/61 (!) 153/67 (!) 153/63 (!) 153/62  Pulse: (!) 58 (!) 52 (!) 57 (!) 58  Resp: '16 16 18 17  '$ Temp: 97.7 F (36.5 C) 97.6 F (36.4 C) 97.7 F (36.5 C) 97.6 F (36.4 C)  TempSrc:   Oral   SpO2: 99% 99% 99% 98%  Weight:      Height:        Intake/Output Summary (Last 24 hours) at 04/28/2021 1517 Last data filed at 04/28/2021 [id number] Gross per 24 hour  Intake --  Output 350 ml  Net -350 ml   Filed Weights   04/17/21 1639 04/17/21 1643  Weight: 90.7 kg 90.7 kg    Physical Exam:  General exam: awake, alert, NAD, good spirits, cheerful Respiratory system: lungs clear, normal respiratory effort.  On room air Cardiovascular system: normal S1/S2, RRR, no pedal edema.   GI: soft and non-tender abdomen Neuro: normal speech, no gross focal deficits    Labs   Data Reviewed: I have personally reviewed following labs and imaging studies  CBC: Recent Labs  Lab 04/22/21 0446 04/27/21 0428  WBC 8.0 9.0  NEUTROABS 4.2  --   HGB 14.8 14.8  HCT 41.9 42.7  MCV 83.6 84.4  PLT 194 [id number]   Basic Metabolic Panel: Recent Labs  Lab 04/22/21 0446 04/27/21 0428  NA 140 139  K 4.1 4.5  CL  103 103  CO2 31 31  GLUCOSE 177* 84  BUN 16 21  CREATININE 0.85 0.90  CALCIUM 9.2 9.2  MG 2.3 2.3   GFR: Estimated Creatinine Clearance: 79 mL/min (by C-G formula based on SCr of 0.9 mg/dL). Liver Function Tests: Recent Labs  Lab 04/22/21 0446  AST 13*  ALT 13  ALKPHOS 68  BILITOT 0.7  PROT 6.1*  ALBUMIN 3.3*   No results for input(s): LIPASE, AMYLASE in the last 168 hours. No results for input(s): AMMONIA in the last 168 hours. Coagulation Profile: No results for input(s): INR, PROTIME in the last 168 hours. Cardiac Enzymes: No results for input(s): CKTOTAL, CKMB, CKMBINDEX, TROPONINI in the last 168 hours. BNP (last 3 results) No results for input(s): PROBNP in the last 8760 hours. HbA1C: No results for input(s): HGBA1C  in the last 72 hours. CBG: Recent Labs  Lab 04/27/21 1222 04/27/21 1655 04/27/21 2146 04/28/21 0917 04/28/21 1319  GLUCAP 96 175* 194* 185* 120*   Lipid Profile: No results for input(s): CHOL, HDL, LDLCALC, TRIG, CHOLHDL, LDLDIRECT in the last 72 hours. Thyroid Function Tests: No results for input(s): TSH, T4TOTAL, FREET4, T3FREE, THYROIDAB in the last 72 hours. Anemia Panel: No results for input(s): VITAMINB12, FOLATE, FERRITIN, TIBC, IRON, RETICCTPCT in the last 72 hours. Sepsis Labs: No results for input(s): PROCALCITON, LATICACIDVEN in the last 168 hours.  No results found for this or any previous visit (from the past 240 hour(s)).     Imaging Studies   No results found.   Medications   Scheduled Meds:  amLODipine  10 mg Oral Daily   aspirin EC  81 mg Oral Daily   atorvastatin  80 mg Oral Daily   clopidogrel  75 mg Oral Daily   enalapril  10 mg Oral Daily   enoxaparin (LOVENOX) injection  40 mg Subcutaneous Q24H   insulin aspart  0-9 Units Subcutaneous TID WC   insulin aspart protamine- aspart  15 Units Subcutaneous BID WC   Continuous Infusions:     LOS: 10 days    Time spent: 20 minutes     Ezekiel Slocumb,  DO [facility]  04/28/2021, 3:17 PM      If 7PM-7AM, please contact night-coverage. How to contact the [facility] Attending or Consulting provider [place] or covering provider during after hours [place], for this patient?    Check the care team in [facility] and look for a) attending/consulting TRH provider listed and b) the [facility] team listed Log into [email or web address] and use [place]'s universal password to access. If you do not have the password, please contact the hospital operator. Locate the [facility] provider you are looking for under [facility] and page to a number that you can be directly reached. If you still have difficulty reaching the provider, please page the [facility] (Director on Call) for the Hospitalists listed on amion for assistance.

## 2021-04-29 LAB — GLUCOSE, CAPILLARY
Glucose-Capillary: 168 mg/dL — ABNORMAL HIGH (ref 70–99)
Glucose-Capillary: 167 mg/dL — ABNORMAL HIGH (ref 70–99)
Glucose-Capillary: 200 mg/dL — ABNORMAL HIGH (ref 70–99)
Glucose-Capillary: 172 mg/dL — ABNORMAL HIGH (ref 70–99)

## 2021-04-29 MED ORDER — METOPROLOL TARTRATE 25 MG PO TABS: 12.5000 mg | ORAL_TABLET | Freq: Two times a day (BID) | ORAL | Status: DC

## 2021-04-29 MED ORDER — ENALAPRIL MALEATE 10 MG PO TABS
10.0000 mg | ORAL_TABLET | Freq: Every day | ORAL | Status: DC
  Administered 2021-04-30 – 2021-05-09 (×10): 10 mg via ORAL
  Filled 2021-04-29 (×10): qty 1

## 2021-04-29 MED ORDER — ENALAPRIL MALEATE 20 MG PO TABS
20.0000 mg | ORAL_TABLET | Freq: Every day | ORAL | Status: DC
  Administered 2021-04-29: 09:00:00 20 mg via ORAL
  Filled 2021-04-29: qty 1

## 2021-04-29 NOTE — Care Management Important Message (Signed)
Important Message  Patient Details  Name: AZARIAN MCGUIGAN MRN: [id number] Date of Birth: [birth year]   Medicare Important Message Given:  Yes     Dannette Barbara 04/29/2021, 11:39 AM

## 2021-04-29 NOTE — Progress Notes (Signed)
PROGRESS NOTE    IRIE MERKER   [id number]  DOB: 1945/09/08  PCP: Patient, No Pcp Per (Inactive)    DOA: 04/17/2021 LOS: 41    Brief Narrative / Hospital Course to Date:   Per HPI from H&P - "COLWYN BAGGARLY is a 75 y.o. male with a past medical history of significant diabetes mellitus type 2, hypertension, hyperlipidemia, neuropathy, vertigo, carotid artery disease, coronary artery disease, history of CVA, former smoker, resented to the hospital with chief complaints of dizziness upon sitting up and standing, in the ER initial work-up negative follow-up MRI shows ischemic infarcts along with loud systolic murmur.  He was admitted for further care."     Medically stable for SNF discharge, needs to finish 10-day quarantine on 04/27/2021.  Assessment & Plan   Principal Problem:   Acute CVA (cerebrovascular accident) [facility]) Active Problems:   Near syncope   Self-care deficit   Lab test positive for detection of COVID-19 virus   Acute on chronic dizziness -with positive orthostatics and postural component.  Does have acute CVA however dizziness seems more consistent with orthostatic, insetting of aortic stenosis.  Improved with BP stabilization. 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   No prior history of cardiac arrhythmias. Echo showed EF 60 to 65%, moderate LVH, grade 1 diastolic dysfunction moderate to severe aortic stenosis. Neurology and cardiology were consulted. Continue DAPT with aspirin Plavix x3 weeks, aspirin monotherapy thereafter. Continue statin. Monitor on telemetry. Cardiology follow-up for Zio patch.  PT and OT recommend SNF, placement pending once outside COVID isolation.  COVID-19 infection -incidental positive.  Patient has no respiratory symptoms.  Stable inflammatory markers.  Possibly contributed to his acute CVA. Monitor  clinically.  Dysphagia -showing signs of aspiration with thin liquids.  Seen by SLP for swallow evaluation.  Now on nectar thickened liquids. -- Aspiration precautions -- Diet per SLP recs  Hypertension with orthostatic hypotension - 8/25 BPs remain uncontrolled.   Continue on Norvasc 10 mg. Low-dose enalapril added 8/22. Increased enalapril to 10 mg. Monitor closely for orthostatic symptoms.   ?  A flutter versus artifact (see initial EKG from the ED).  Subsequent EKG was sinus rhythm.   Cardiology feels this was artifact.  TSH normal.   Monitor on telemetry. Echo results as above. 8/29 -  A-flutter reported by central tele. Will d/w cardiology having Ziopatch placed prior to SNF d/c. --EKG pending --HR in the 50's, beta blocker contraindicated Outpatient cardiology follow up.  Moderate to severe aortic stenosis -outpatient cardiology follow-up.  Avoid drops in BP and preload.  Medical management.  Dyslipidemia -on statin  History of CAD -stable, no active chest pain or acute ischemic EKG changes.  Continue aspirin and Plavix  Chronic diastolic CHF -euvolemic and compensated.  Monitor volume status  Type 2 diabetes -continue on 70/30 and sliding scale NovoLog.  8/25: Increased 70/30 to 15 units twice daily with meals.  Continue sensitive sliding scale NovoLog (0-9) TID WC    Patient BMI: Body mass index is 27.12 kg/m.   DVT prophylaxis: enoxaparin (LOVENOX) injection 40 mg Start: 04/17/21 2200   Diet:  Diet Orders (From admission, onward)     Start     Ordered   04/25/21 1430  DIET DYS 3 Room service appropriate? Yes with Assist; Fluid consistency: Nectar Thick  Diet effective now       Comments: Please add extra gravy  on meats, potatoes.  Likes meatloaf and pintos w/ mashed potatoes.  Question Answer Comment  Room service appropriate? Yes with Assist   Fluid consistency: Nectar Thick      04/25/21 1433              Code Status: Full Code   Subjective  04/29/21    Patient seen this AM.  Reports feeling well.  Denies complaints.  Says he's hungry for breakfast.  No acute events reported overnight.   Disposition Plan & Communication   Status is: Inpatient  Remains inpatient appropriate because: Requires SNF placement at discharge, awaiting 10-day COVID isolation.  Dispo: The patient is from: Home              Anticipated d/c is to: SNF              Patient currently is medically stable to d/c.   Difficult to place patient No    Consults, Procedures, Significant Events   Consultants:  Cardiology Neurology  Procedures:  Echo 04/20/21 - EF [id number], grade 1 diastolic dysfunction, moderate LVH, moderate to severe aortic stenosis  Antimicrobials:  Anti-infectives (From admission, onward)    Start     Dose/Rate Route Frequency Ordered Stop   04/17/21 2230  nirmatrelvir/ritonavir EUA (PAXLOVID) 3 tablet  Status:  Discontinued        3 tablet Oral 2 times daily 04/17/21 2218 04/17/21 2225         Micro    Objective   Vitals:   04/29/21 0800 04/29/21 1134 04/29/21 1609 04/29/21 1614  BP: 138/68 131/62 (!) 108/59 (!) 113/53  Pulse: (!) 57 (!) 59 (!) 58   Resp: '12 16 16   '$ Temp: (!) 97.5 F (36.4 C) 97.8 F (36.6 C) (!) 97.5 F (36.4 C)   TempSrc: Oral Oral Oral   SpO2: 97% 96% 98%   Weight:      Height:        Intake/Output Summary (Last 24 hours) at 04/29/2021 1731 Last data filed at 04/29/2021 1539 Gross per 24 hour  Intake 480 ml  Output 1900 ml  Net -1420 ml   Filed Weights   04/17/21 1639 04/17/21 1643  Weight: 90.7 kg 90.7 kg    Physical Exam:  General exam: awake, alert, NAD Respiratory system: normal respiratory effort.  On room air Cardiovascular system: normal S1/S2, RRR, no pedal edema.   GI: no distention, non-tender Neuro: normal speech, no gross focal deficits, oriented to self    Labs   Data Reviewed: I have personally reviewed following labs and imaging studies  CBC: Recent Labs   Lab 04/27/21 0428  WBC 9.0  HGB 14.8  HCT 42.7  MCV 84.4  PLT [id number]   Basic Metabolic Panel: Recent Labs  Lab 04/27/21 0428  NA 139  K 4.5  CL 103  CO2 31  GLUCOSE 84  BUN 21  CREATININE 0.90  CALCIUM 9.2  MG 2.3   GFR: Estimated Creatinine Clearance: 79 mL/min (by C-G formula based on SCr of 0.9 mg/dL). Liver Function Tests: No results for input(s): AST, ALT, ALKPHOS, BILITOT, PROT, ALBUMIN in the last 168 hours.  No results for input(s): LIPASE, AMYLASE in the last 168 hours. No results for input(s): AMMONIA in the last 168 hours. Coagulation Profile: No results for input(s): INR, PROTIME in the last 168 hours. Cardiac Enzymes: No results for input(s): CKTOTAL, CKMB, CKMBINDEX, TROPONINI in the last 168 hours. BNP (last 3 results) No results for input(s): PROBNP in  the last 8760 hours. HbA1C: No results for input(s): HGBA1C in the last 72 hours. CBG: Recent Labs  Lab 04/28/21 1634 04/28/21 2010 04/29/21 0836 04/29/21 1239 04/29/21 1542  GLUCAP 227* 253* 168* 167* 200*   Lipid Profile: No results for input(s): CHOL, HDL, LDLCALC, TRIG, CHOLHDL, LDLDIRECT in the last 72 hours. Thyroid Function Tests: No results for input(s): TSH, T4TOTAL, FREET4, T3FREE, THYROIDAB in the last 72 hours. Anemia Panel: No results for input(s): VITAMINB12, FOLATE, FERRITIN, TIBC, IRON, RETICCTPCT in the last 72 hours. Sepsis Labs: No results for input(s): PROCALCITON, LATICACIDVEN in the last 168 hours.  No results found for this or any previous visit (from the past 240 hour(s)).     Imaging Studies   No results found.   Medications   Scheduled Meds:  aspirin EC  81 mg Oral Daily   atorvastatin  80 mg Oral Daily   clopidogrel  75 mg Oral Daily   enalapril  20 mg Oral Daily   enoxaparin (LOVENOX) injection  40 mg Subcutaneous Q24H   insulin aspart  0-9 Units Subcutaneous TID WC   insulin aspart protamine- aspart  15 Units Subcutaneous BID WC   [START ON 04/30/2021]  metoprolol tartrate  12.5 mg Oral BID   Continuous Infusions:     LOS: 11 days    Time spent: 30 minutes with > 50% spent at bedside and in coordination of care      Ezekiel Slocumb, DO [facility]  04/29/2021, 5:31 PM      If 7PM-7AM, please contact night-coverage. How to contact the [facility] Attending or Consulting provider [place] or covering provider during after hours [place], for this patient?    Check the care team in [facility] and look for a) attending/consulting TRH provider listed and b) the [facility] team listed Log into [email or web address] and use [place]'s universal password to access. If you do not have the password, please contact the hospital operator. Locate the [facility] provider you are looking for under [facility] and page to a number that you can be directly reached. If you still have difficulty reaching the provider, please page the [facility] (Director on Call) for the Hospitalists listed on amion for assistance.

## 2021-04-29 NOTE — TOC Progression Note (Addendum)
Transition of Care [facility]) - Progression Note    Patient Details  Name: Victor Fox MRN: [id number] Date of Birth: 14-Jan-1946  [facility]) CM/SW [place], RN Phone Number: 04/29/2021, 9:31 AM  Clinical Narrative:   Spoke with patient today, he chose [facility].  RNCM attempted to leave message for APS worker, however her voicemail remains full, left message at general APS number.    Patient's COVID quarantine has ended.  Tonya at [facility] notified of patient's selection, can accept tomorrow.  Addendum:  reached APS worker who concurred with [place].  APS is aware that patient expects to transfer tomorrow.       Expected Discharge Plan and Services                                                 Social Determinants of Health (SDOH) Interventions    Readmission Risk Interventions No flowsheet data found.

## 2021-04-29 NOTE — Progress Notes (Addendum)
Dr Arbutus Ped made aware that tele monitoring reports that pt has converted to aflutter, was NSR, acknowledged, no new orders

## 2021-04-29 NOTE — Progress Notes (Signed)
Physical Therapy Treatment Patient Details Name: Victor Fox MRN: [id number] DOB: Feb 26, 1946 Today's Date: 04/29/2021    History of Present Illness 75 y.o. male with a past medical history of significant diabetes mellitus type 2, hypertension, hyperlipidemia, neuropathy, vertigo, carotid artery disease, coronary artery disease, history of CVA, former smoker, resented to the hospital with c/o unsteainess/falls starting 8/13, MRI shows ischemic infarct.    PT Comments    Pt stated he recently participated in OT but willing to walk.  OOB with min guard/supervision.  Steady in sitting.  Stands with min assist and is able to walk 200' with generally unsteady gait in hallway and needs min assist and cues for safe turns with walker.  Pt is a poor historian when asked about his assist at home and is unable to provide information on who is available to help.  "A guy named Victor Fox" but then stated he has moved out and now lives in [place].  He stated he feels comfortable walking on his own but is clearly unsafe to do so a this time.  He opt to remain in recliner after session and needs are met.  SNF remains appropriate in order to improve overall strength, mobility, safety and arrange for a safe discharge plan with appropriate support in place. He is motivated to improve and engages in therapy session well.   Follow Up Recommendations  SNF;Supervision for mobility/OOB     Equipment Recommendations  None recommended by PT    Recommendations for Other Services       Precautions / Restrictions Precautions Precautions: Fall Restrictions Weight Bearing Restrictions: No    Mobility  Bed Mobility Overal bed mobility: Needs Assistance Bed Mobility: Supine to Sit     Supine to sit: Min guard;Supervision          Transfers Overall transfer level: Needs assistance Equipment used: Rolling walker (2 wheeled) Transfers: Sit to/from Stand Sit to Stand: Min guard;Min assist             Ambulation/Gait Ambulation/Gait assistance: Min guard;Min assist Gait Distance (Feet): 200 Feet Assistive device: Rolling walker (2 wheeled) Gait Pattern/deviations: Decreased stride length;Trunk flexed;Step-through pattern;Wide base of support Gait velocity: decreased   General Gait Details: pt is able to make several trips along B pod hallway with verbal and tactile assist to manage turning walker   Stairs             Wheelchair Mobility    Modified Rankin (Stroke Patients Only)       Balance Overall balance assessment: Needs assistance Sitting-balance support: Feet supported;No upper extremity supported Sitting balance-Leahy Scale: Fair     Standing balance support: Bilateral upper extremity supported Standing balance-Leahy Scale: Fair                              Cognition Arousal/Alertness: Awake/alert Behavior During Therapy: WFL for tasks assessed/performed Overall Cognitive Status: Within Functional Limits for tasks assessed                                        Exercises      General Comments        Pertinent Vitals/Pain Pain Assessment: No/denies pain    Home Living                      Prior Function  PT Goals (current goals can now be found in the care plan section) Progress towards PT goals: Progressing toward goals    Frequency    Min 2X/week      PT Plan Current plan remains appropriate    Co-evaluation              AM-PAC PT "6 Clicks" Mobility   Outcome Measure  Help needed turning from your back to your side while in a flat bed without using bedrails?: A Little Help needed moving from lying on your back to sitting on the side of a flat bed without using bedrails?: A Little Help needed moving to and from a bed to a chair (including a wheelchair)?: A Little Help needed standing up from a chair using your arms (e.g., wheelchair or bedside chair)?: A Little Help  needed to walk in hospital room?: A Little Help needed climbing 3-5 steps with a railing? : A Lot 6 Click Score: 17    End of Session Equipment Utilized During Treatment: Gait belt Activity Tolerance: Patient tolerated treatment well Patient left: in chair;with call bell/phone within reach;with chair alarm set;with nursing/sitter in room Nurse Communication: Mobility status PT Visit Diagnosis: Muscle weakness (generalized) (M62.81);Difficulty in walking, not elsewhere classified (R26.2);Unsteadiness on feet (R26.81)     Time: 0315-0330 PT Time Calculation (min) (ACUTE ONLY): 15 min  Charges:  $Gait Training: 8-22 mins                    Chesley Noon, PTA 04/29/21, 3:55 PM , 3:51 PM

## 2021-04-29 NOTE — Progress Notes (Signed)
Occupational Therapy Treatment Patient Details Name: Victor Fox MRN: [id number] DOB: 17-Sep-1945 Today's Date: 04/29/2021    History of present illness 75 y.o. male with a past medical history of significant diabetes mellitus type 2, hypertension, hyperlipidemia, neuropathy, vertigo, carotid artery disease, coronary artery disease, history of CVA, former smoker, resented to the hospital with c/o unsteainess/falls starting 8/13, MRI shows ischemic infarct.   OT comments  Upon entering the room, pt supine in bed and sleeping soundly. Pt does arouse to therapist's attempt to wake him and is agreeable to OT intervention. Pt donning B socks from bed with set up A. Supine >sit with min guard to EOB. Pt refusal to use RW. Pt standing with min A and ambulating with very unsteady gait and min A into bathroom for toileting needs. Pt unable to have BM and returns back to bed in same manner as above. Pt requesting drink and OT thickened liquids and pt drinking with small sips and no coughing noted. Pt returns to supine to rest. Bed alarm activated and all needs within reach. OT continues to recommend short term rehab to address functional deficits.    Follow Up Recommendations  SNF    Equipment Recommendations  3 in 1 bedside commode       Precautions / Restrictions Precautions Precautions: Fall Restrictions Weight Bearing Restrictions: No       Mobility Bed Mobility Overal bed mobility: Needs Assistance Bed Mobility: Supine to Sit;Sit to Supine     Supine to sit: Min guard Sit to supine: Min guard   General bed mobility comments: assist for trunk support    Transfers Overall transfer level: Needs assistance Equipment used: Rolling walker (2 wheeled) Transfers: Sit to/from Stand Sit to Stand: Min guard;Min assist         General transfer comment: vc's for safe technique    Balance Overall balance assessment: Needs assistance Sitting-balance support: Feet supported;No upper  extremity supported Sitting balance-Leahy Scale: Fair Sitting balance - Comments: supervision sitting EOB   Standing balance support: Bilateral upper extremity supported Standing balance-Leahy Scale: Fair                             ADL either performed or assessed with clinical judgement   ADL Overall ADL's : Needs assistance/impaired     Grooming: Wash/dry hands;Wash/dry face;Minimal assistance;Standing                   Toilet Transfer: Minimal assistance;Grab bars;RW;Comfort height toilet           Functional mobility during ADLs: Minimal assistance;Rolling walker       Vision Patient Visual Report: No change from baseline            Cognition Arousal/Alertness: Awake/alert Behavior During Therapy: WFL for tasks assessed/performed Overall Cognitive Status: Within Functional Limits for tasks assessed                                 General Comments: Pt follows one step commands with increased time to sequence and initiate                   Pertinent Vitals/ Pain       Pain Assessment: No/denies pain  Home Living Family/patient expects to be discharged to:: Private residence Living Arrangements: Alone Available Help at Discharge: Friend(s);Available PRN/intermittently Type of Home: Mobile home  Frequency  Min 1X/week        Progress Toward Goals  OT Goals(current goals can now be found in the care plan section)  Progress towards OT goals: Progressing toward goals  Acute Rehab OT Goals Patient Stated Goal: get stronger OT Goal Formulation: With patient Time For Goal Achievement: 05/02/21 Potential to Achieve Goals: Good  Plan Discharge plan remains appropriate;Frequency remains appropriate       AM-PAC OT "6 Clicks" Daily Activity     Outcome Measure   Help from another person eating meals?: None Help from another person taking care of personal  grooming?: A Little Help from another person toileting, which includes using toliet, bedpan, or urinal?: A Little Help from another person bathing (including washing, rinsing, drying)?: A Little Help from another person to put on and taking off regular upper body clothing?: A Little Help from another person to put on and taking off regular lower body clothing?: A Little 6 Click Score: 19    End of Session    OT Visit Diagnosis: Other abnormalities of gait and mobility (R26.89);Muscle weakness (generalized) (M62.81)   Activity Tolerance Patient tolerated treatment well   Patient Left with call bell/phone within reach;in bed;with bed alarm set   Nurse Communication Mobility status        Time: [id number] OT Time Calculation (min): 24 min  Charges: OT General Charges $OT Visit: 1 Visit OT Treatments $Self Care/Home Management : 23-37 mins  Darleen Crocker, MS, OTR/L , CBIS ascom [phone]  04/29/21, 4:08 PM

## 2021-04-30 LAB — GLUCOSE, CAPILLARY
Glucose-Capillary: 180 mg/dL — ABNORMAL HIGH (ref 70–99)
Glucose-Capillary: 142 mg/dL — ABNORMAL HIGH (ref 70–99)
Glucose-Capillary: 328 mg/dL — ABNORMAL HIGH (ref 70–99)
Glucose-Capillary: 334 mg/dL — ABNORMAL HIGH (ref 70–99)
Glucose-Capillary: 296 mg/dL — ABNORMAL HIGH (ref 70–99)

## 2021-04-30 LAB — CBC
HCT: 41.4 % (ref 39.0–52.0)
Hemoglobin: 14.6 g/dL (ref 13.0–17.0)
MCH: 30 pg (ref 26.0–34.0)
MCHC: 35.3 g/dL (ref 30.0–36.0)
MCV: 85 fL (ref 80.0–100.0)
Platelets: 203 10*3/uL (ref 150–400)
RBC: 4.87 MIL/uL (ref 4.22–5.81)
RDW: 13.4 % (ref 11.5–15.5)
WBC: 9.4 10*3/uL (ref 4.0–10.5)
nRBC: 0 % (ref 0.0–0.2)

## 2021-04-30 NOTE — TOC Progression Note (Signed)
Transition of Care [facility]) - Progression Note    Patient Details  Name: Victor Fox MRN: [id number] Date of Birth: 1945/11/07  Transition [facility] Care [facility]) CM/SW [place], RN Phone Number: 04/30/2021, 4:08 PM  Clinical Narrative:   Kenney Houseman from [facility] contacted RNCM and stated they facility cannot accept admits at this time, that Mr. Coopersmith will have to go to another facility.    [facility] also offered a bed to this patient.  RNCM contacted Rickey at [facility], he will check to see if there is still a bed for this patient.   TOC to follow to discharge.         Expected Discharge Plan and Services                                                 Social Determinants of Health (SDOH) Interventions    Readmission Risk Interventions No flowsheet data found.

## 2021-04-30 NOTE — Progress Notes (Signed)
PROGRESS NOTE    Victor Fox   [id number]  DOB: [birth year]  PCP: Patient, No Pcp Per (Inactive)    DOA: 04/17/2021 LOS: 12    Brief Narrative / Hospital Course to Date:   Per HPI from H&P - "MOOSA KLIMA is a 75 y.o. male with a past medical history of significant diabetes mellitus type 2, hypertension, hyperlipidemia, neuropathy, vertigo, carotid artery disease, coronary artery disease, history of CVA, former smoker, resented to the hospital with chief complaints of dizziness upon sitting up and standing, in the ER initial work-up negative follow-up MRI shows ischemic infarcts along with loud systolic murmur.  He was admitted for further care."     Medically stable for SNF discharge, needs to finish 10-day quarantine on 04/27/2021.  Assessment & Plan   Principal Problem:   Acute CVA (cerebrovascular accident) [facility]) Active Problems:   Near syncope   Self-care deficit   Lab test positive for detection of COVID-19 virus   Acute on chronic dizziness -with positive orthostatics and postural component.  Does have acute CVA however dizziness seems more consistent with orthostatic, insetting of aortic stenosis.  Improved with BP stabilization. 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   No prior history of cardiac arrhythmias. Echo showed EF 60 to 65%, moderate LVH, grade 1 diastolic dysfunction moderate to severe aortic stenosis. Neurology and cardiology were consulted. Continue DAPT with aspirin Plavix x3 weeks, aspirin monotherapy thereafter. Continue statin. Monitor on telemetry. Cardiology follow-up for Zio patch - to be applied on day of discharge (notify [facility] cards).  PT and OT recommend SNF, placement pending once outside COVID isolation.  COVID-19 infection -incidental positive.  Patient has no respiratory symptoms.   Stable inflammatory markers.    Possibly contributed to his acute CVA. Monitor clinically.  He's been asymptomatic.  Dysphagia - earlier this admission, had signs of aspiration with thin liquids.   Seen by SLP for swallow evaluation.   Now on nectar thickened liquids. -- Aspiration precautions -- Diet per SLP recs  Hypertension with orthostatic hypotension - BP's overall controlled, labile. Continue on enalapril 10 mg daily. Had been on Norvasc as well, stopped due to soft BP's Monitor closely for orthostatic symptoms.   A flutter RULED OUT - artifact (see initial EKG from the ED).  Subsequent EKG was sinus rhythm.  Cardiology feels this was artifact.   TSH normal.   Monitor on telemetry. 8/19 Echo results as below. 8/29 -  A-flutter reported by central tele. Cardiology reviewed tele and again feel it's artifact. Ziopatch to be placed on day of d/c. Outpatient cardiology follow up.  Moderate to severe aortic stenosis -outpatient cardiology follow-up.   Avoid drops in BP and preload.   Medical management.  Dyslipidemia -on statin  History of CAD -stable, no active chest pain or acute ischemic EKG changes.   Continue aspirin and Plavix  Chronic diastolic CHF - euvolemic and compensated.   Monitor volume status  Type 2 diabetes -continue on 70/30 and sliding scale NovoLog.  8/25: Increased 70/30 to 15 units twice daily with meals.  Continue sensitive sliding scale NovoLog (0-9) TID WC    Patient BMI: Body mass index is 27.12 kg/m.   DVT prophylaxis: enoxaparin (LOVENOX) injection 40 mg Start: 04/17/21 2200   Diet:  Diet Orders (From admission, onward)     Start     Ordered   04/25/21 1430  DIET  DYS 3 Room service appropriate? Yes with Assist; Fluid consistency: Nectar Thick  Diet effective now       Comments: Please add extra gravy on meats, potatoes.  Likes meatloaf and pintos w/ mashed potatoes.  Question Answer Comment  Room service appropriate? Yes with Assist   Fluid consistency: Nectar  Thick      04/25/21 1433              Code Status: Full Code   Subjective 04/30/21    Patient seen this AM while eating breakfast.  He says he fells well.  No acute complaints.     Disposition Plan & Communication   Status is: Inpatient  Remains inpatient appropriate because: Requires SNF placement at discharge, awaiting 10-day COVID isolation.  Dispo: The patient is from: Home              Anticipated d/c is to: SNF              Patient currently is medically stable to d/c.   Difficult to place patient No    Consults, Procedures, Significant Events   Consultants:  Cardiology Neurology  Procedures:  Echo 04/20/21 - EF [id number], grade 1 diastolic dysfunction, moderate LVH, moderate to severe aortic stenosis  Antimicrobials:  Anti-infectives (From admission, onward)    Start     Dose/Rate Route Frequency Ordered Stop   04/17/21 2230  nirmatrelvir/ritonavir EUA (PAXLOVID) 3 tablet  Status:  Discontinued        3 tablet Oral 2 times daily 04/17/21 2218 04/17/21 2225         Micro    Objective   Vitals:   04/30/21 0410 04/30/21 0729 04/30/21 1214 04/30/21 1232  BP: 136/78 (!) 141/62 (!) 141/60 (!) 148/72  Pulse: (!) 57 (!) 57 (!) 58 (!) 54  Resp: '16 18  18  '$ Temp: 98 F (36.7 C) 98.1 F (36.7 C) 98.3 F (36.8 C) 98.2 F (36.8 C)  TempSrc: Oral Oral Oral Oral  SpO2: 99% 98% 97% 98%  Weight:      Height:        Intake/Output Summary (Last 24 hours) at 04/30/2021 1324 Last data filed at 04/30/2021 1205 Gross per 24 hour  Intake 240 ml  Output 2000 ml  Net -1760 ml   Filed Weights   04/17/21 1639 04/17/21 1643  Weight: 90.7 kg 90.7 kg    Physical Exam:  General exam: awake, alert, NAD, in good spirits, eating breakfast Respiratory system: lungs clear, normal respiratory effort.  On room air Cardiovascular system: normal S1/S2, RRR, no pedal edema.   GI: no distention, non-tender Neuro: normal speech, no gross focal deficits, oriented to  self    Labs   Data Reviewed: I have personally reviewed following labs and imaging studies  CBC: Recent Labs  Lab 04/27/21 0428 04/30/21 0419  WBC 9.0 9.4  HGB 14.8 14.6  HCT 42.7 41.4  MCV 84.4 85.0  PLT 171 [id number]   Basic Metabolic Panel: Recent Labs  Lab 04/27/21 0428  NA 139  K 4.5  CL 103  CO2 31  GLUCOSE 84  BUN 21  CREATININE 0.90  CALCIUM 9.2  MG 2.3   GFR: Estimated Creatinine Clearance: 79 mL/min (by C-G formula based on SCr of 0.9 mg/dL). Liver Function Tests: No results for input(s): AST, ALT, ALKPHOS, BILITOT, PROT, ALBUMIN in the last 168 hours.  No results for input(s): LIPASE, AMYLASE in the last 168 hours. No results for input(s): AMMONIA in the last  168 hours. Coagulation Profile: No results for input(s): INR, PROTIME in the last 168 hours. Cardiac Enzymes: No results for input(s): CKTOTAL, CKMB, CKMBINDEX, TROPONINI in the last 168 hours. BNP (last 3 results) No results for input(s): PROBNP in the last 8760 hours. HbA1C: No results for input(s): HGBA1C in the last 72 hours. CBG: Recent Labs  Lab 04/29/21 1239 04/29/21 1542 04/29/21 2215 04/30/21 0728 04/30/21 1201  GLUCAP 167* 200* 172* 180* 142*   Lipid Profile: No results for input(s): CHOL, HDL, LDLCALC, TRIG, CHOLHDL, LDLDIRECT in the last 72 hours. Thyroid Function Tests: No results for input(s): TSH, T4TOTAL, FREET4, T3FREE, THYROIDAB in the last 72 hours. Anemia Panel: No results for input(s): VITAMINB12, FOLATE, FERRITIN, TIBC, IRON, RETICCTPCT in the last 72 hours. Sepsis Labs: No results for input(s): PROCALCITON, LATICACIDVEN in the last 168 hours.  No results found for this or any previous visit (from the past 240 hour(s)).     Imaging Studies   No results found.   Medications   Scheduled Meds:  aspirin EC  81 mg Oral Daily   atorvastatin  80 mg Oral Daily   clopidogrel  75 mg Oral Daily   enalapril  10 mg Oral Daily   enoxaparin (LOVENOX) injection  40 mg  Subcutaneous Q24H   insulin aspart  0-9 Units Subcutaneous TID WC   insulin aspart protamine- aspart  15 Units Subcutaneous BID WC   Continuous Infusions:     LOS: 12 days    Time spent: 25 minutes with > 50% spent at bedside and in coordination of care      Ezekiel Slocumb, DO [facility]  04/30/2021, 1:24 PM      If 7PM-7AM, please contact night-coverage. How to contact the [facility] Attending or Consulting provider [place] or covering provider during after hours [place], for this patient?    Check the care team in [facility] and look for a) attending/consulting TR[facility] provider listed and b) the [facility] team listed Log into [email or web address] and use [place]'s universal password to access. If you do not have the password, please contact the hospital operator. Locate the [facility] provider you are looking for under [facility] and page to a number that you can be directly reached. If you still have difficulty reaching the provider, please page the [facility] (Director on Call) for the Hospitalists listed on amion for assistance.

## 2021-04-30 NOTE — Plan of Care (Signed)
  Problem: Education: Goal: Knowledge of General Education information will improve Description: Including pain rating scale, medication(s)/side effects and non-pharmacologic comfort measures Outcome: Progressing   Problem: Education: Goal: Knowledge of risk factors and measures for prevention of condition will improve Outcome: Progressing   Problem: Education: Goal: Knowledge of disease or condition will improve Outcome: Progressing Goal: Knowledge of secondary prevention will improve Outcome: Progressing Goal: Knowledge of patient specific risk factors addressed and post discharge goals established will improve Outcome: Progressing   Problem: Ischemic Stroke/TIA Tissue Perfusion: Goal: Complications of ischemic stroke/TIA will be minimized Outcome: Progressing

## 2021-04-30 NOTE — Progress Notes (Signed)
   Telemetry reviewed with MD, which demonstrates sinus rhythm with artifact. No evidence of atrial flutter. Please notify cardiology on day of discharge for placement of Zio.

## 2021-05-01 DIAGNOSIS — I5032 Chronic diastolic (congestive) heart failure: Secondary | ICD-10-CM

## 2021-05-01 DIAGNOSIS — E785 Hyperlipidemia, unspecified: Secondary | ICD-10-CM

## 2021-05-01 DIAGNOSIS — E1169 Type 2 diabetes mellitus with other specified complication: Secondary | ICD-10-CM

## 2021-05-01 LAB — GLUCOSE, CAPILLARY
Glucose-Capillary: 186 mg/dL — ABNORMAL HIGH (ref 70–99)
Glucose-Capillary: 147 mg/dL — ABNORMAL HIGH (ref 70–99)
Glucose-Capillary: 318 mg/dL — ABNORMAL HIGH (ref 70–99)
Glucose-Capillary: 111 mg/dL — ABNORMAL HIGH (ref 70–99)

## 2021-05-01 MED ORDER — INSULIN ASPART PROT & ASPART (70-30 MIX) 100 UNIT/ML ~~LOC~~ SUSP
15.0000 [IU] | Freq: Every day | SUBCUTANEOUS | Status: DC
  Administered 2021-05-02 – 2021-05-09 (×8): 15 [IU] via SUBCUTANEOUS
  Filled 2021-05-01: qty 10

## 2021-05-01 MED ORDER — INSULIN ASPART PROT & ASPART (70-30 MIX) 100 UNIT/ML ~~LOC~~ SUSP
20.0000 [IU] | Freq: Every day | SUBCUTANEOUS | Status: DC
  Administered 2021-05-01 – 2021-05-07 (×7): 20 [IU] via SUBCUTANEOUS
  Filled 2021-05-01: qty 10

## 2021-05-01 NOTE — TOC Progression Note (Addendum)
Transition of Care [facility]) - Progression Note    Patient Details  Name: Victor Fox MRN: [id number] Date of Birth: 03/05/1946  [facility] of [facility] CM/SW [place], RN Phone Number: 05/01/2021, 10:41 AM  Clinical Narrative:   [facility] cannot take patient due to staffing, [facility] originally offered a bed but has rescinded due bed unavailbility at this time.  Bed search resent and extended.  APS notified.  [id number] A PASRR        Expected Discharge Plan and Services                                                 Social Determinants of Health (SDOH) Interventions    Readmission Risk Interventions No flowsheet data found.

## 2021-05-01 NOTE — Plan of Care (Signed)
Reinforced with patient. To take his time when intaking/ consuming liquids. Pt was observed coughing when drinking nectar thick liquids.  Encouraged pt to take one sip at a time. Needs further reinforcement. Pt demonstrates well when RN remains in the room for oral intake

## 2021-05-01 NOTE — Progress Notes (Signed)
Patient ID: Victor Fox, male   DOB: October 21, 1945, 75 y.o.   MRN: [id number] [facility] PROGRESS NOTE  Victor Fox [id number] DOB: Dec 31, 1945 DOA: 04/17/2021 PCP: Patient, No Pcp Per (Inactive)  HPI/Subjective: Patient feels fine and offers no complaints.  Awaiting nursing home placement.  Initially admitted 14 days ago with dizziness and unsteady gait.  Patient found to have stroke with microhemorrhages.  Objective: Vitals:   05/01/21 0901 05/01/21 1452  BP: (!) 161/60 (!) 142/56  Pulse: (!) 55 70  Resp: 16 16  Temp: (!) 97.3 F (36.3 C) 98.3 F (36.8 C)  SpO2: 98% 98%    Intake/Output Summary (Last 24 hours) at 05/01/2021 1734 Last data filed at 05/01/2021 1011 Gross per 24 hour  Intake 480 ml  Output 850 ml  Net -370 ml   Filed Weights   04/17/21 1639 04/17/21 1643  Weight: 90.7 kg 90.7 kg    ROS: Review of Systems  Respiratory:  Negative for shortness of breath.   Cardiovascular:  Negative for chest pain.  Gastrointestinal:  Negative for abdominal pain, nausea and vomiting.  Exam: Physical Exam HENT:     Head: Normocephalic.     Mouth/Throat:     Pharynx: No oropharyngeal exudate.  Eyes:     General: Lids are normal.     Conjunctiva/sclera: Conjunctivae normal.  Cardiovascular:     Rate and Rhythm: Normal rate and regular rhythm.     Heart sounds: S1 normal and S2 normal. Murmur heard.  Systolic murmur is present with a grade of 4/6.  Pulmonary:     Breath sounds: Normal breath sounds. No decreased breath sounds, wheezing, rhonchi or rales.  Abdominal:     Palpations: Abdomen is soft.     Tenderness: There is no abdominal tenderness.  Musculoskeletal:     Right lower leg: No swelling.     Left lower leg: No swelling.  Skin:    General: Skin is warm.     Findings: No rash.  Neurological:     Mental Status: He is alert.     Comments: Answers questions appropriately.  Able to straight raise.      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  Neurology cleared to be on aspirin and Plavix for 21 days and then single coverage after that.  Zio patch to be applied on the day of discharge to rule out arrhythmia.  Echocardiogram showed moderate to severe aortic stenosis with normal EF.  Physical therapy recommending rehab.  Continue atorvastatin.  LDL 103. Incidental COVID-19 infection out of isolation at this point Acute on chronic dizziness likely secondary to strokes and orthostatic hypotension with aortic stenosis.  Patient on enalapril only with his orthostatic hypotension. Type 2 diabetes mellitus with hyperlipidemia unspecified on atorvastatin.  On 70/30 insulin will increase p.m. insulin dose with high sugars in the evening History of CAD on aspirin and Plavix Chronic diastolic congestive heart failure.  No signs of heart failure currently  Code Status:     Code Status Orders  (From admission, onward)           Start     Ordered   04/17/21 2044  Full code  Continuous        04/17/21 2046           Code Status History     Date Active Date Inactive Code Status Order ID Comments User Context   10/18/2019 2341 10/20/2019 2012 Full Code [id number]  Sidney Ace Arvella Merles, MD ED   09/01/2018 1947 09/03/2018 1808 Full Code [id number]  Saundra Shelling, MD ED      Advance Directive Documentation    [place] Most Recent Value  Type of Advance Directive Living will  Pre-existing out of facility DNR order (yellow form or pink MOST form) --  "MOST"  Form in Place? --      Disposition Plan: Status is: Inpatient  Dispo: The patient is from: Home              Anticipated d/c is to: Rehab              Patient currently medically stable to go out to rehab once rehab facility obtained.   Difficult to place patient.  Yes.  Time spent: 27 minutes  [place]

## 2021-05-01 NOTE — Evaluation (Addendum)
Occupational Therapy RE-Evaluation Patient Details Name: Victor Fox MRN: [id number] DOB: 1945/10/05 Today's Date: 05/01/2021    History of Present Illness 75 y.o. male with a past medical history of significant diabetes mellitus type 2, hypertension, hyperlipidemia, neuropathy, vertigo, carotid artery disease, coronary artery disease, history of CVA, former smoker, resented to the hospital with c/o unsteainess/falls starting 8/13, MRI shows ischemic infarct.   Clinical Impression   Upon entering the room, pt supine in bed supine in bed with no c/o pain and agreeable to OT intervention. RN giving medications. OT assisted pt with donning clean hospital gown. Pt declined toileting needs. Supine >sit with min guard to EOB. Pt washing face and hands while seated EOB with close supervision for balance. Stand pivot transfer with min cuing for technique and hand placement and min A for balance to recliner chair. Chair alarm belt donned with pt able to demonstrate how to doff. All needs within reach. Pt making progress towards OT goals and goals remain appropriate at this time. OT continues to recommend short term rehab at discharge.    Follow Up Recommendations  SNF    Equipment Recommendations  3 in 1 bedside commode       Precautions / Restrictions Precautions Precautions: Fall      Mobility Bed Mobility Overal bed mobility: Needs Assistance Bed Mobility: Supine to Sit;Sit to Supine     Supine to sit: Min guard Sit to supine: Min guard   General bed mobility comments: assist for trunk support and min cuing for hand placement    Transfers Overall transfer level: Needs assistance Equipment used: Rolling walker (2 wheeled) Transfers: Sit to/from Stand Sit to Stand: Min guard;Min assist         General transfer comment: vc's for safe technique    Balance Overall balance assessment: Needs assistance Sitting-balance support: Feet supported;No upper extremity supported Sitting  balance-Leahy Scale: Fair Sitting balance - Comments: supervision sitting EOB   Standing balance support: Bilateral upper extremity supported Standing balance-Leahy Scale: Poor Standing balance comment: UE support and very unsteady                           ADL either performed or assessed with clinical judgement   ADL Overall ADL's : Needs assistance/impaired                 Upper Body Dressing : Set up;Supervision/safety;Bed level                           Vision Patient Visual Report: No change from baseline              Pertinent Vitals/Pain Pain Assessment: No/denies pain              Cognition Arousal/Alertness: Awake/alert Behavior During Therapy: WFL for tasks assessed/performed Overall Cognitive Status: No family/caregiver present to determine baseline cognitive functioning Area of Impairment: Safety/judgement                 Orientation Level: Disoriented to;Place;Situation       Safety/Judgement: Decreased awareness of safety;Decreased awareness of deficits     General Comments: Pt needing increased time to follow commands.     OT Goals(Current goals can be found in the care plan section) Acute Rehab OT Goals Patient Stated Goal: get stronger OT Goal Formulation: With patient Time For Goal Achievement: 05/15/21 Potential to Achieve Goals: Good  OT Frequency: Min 2X/week  AM-PAC OT "6 Clicks" Daily Activity     Outcome Measure Help from another person eating meals?: None Help from another person taking care of personal grooming?: A Little Help from another person toileting, which includes using toliet, bedpan, or urinal?: A Lot Help from another person bathing (including washing, rinsing, drying)?: A Little Help from another person to put on and taking off regular upper body clothing?: A Little Help from another person to put on and taking off regular lower body clothing?: A Little 6 Click Score: 18   End of  Session Equipment Utilized During Treatment: Rolling walker Nurse Communication: Mobility status  Activity Tolerance: Patient tolerated treatment well Patient left: with call bell/phone within reach;in chair;with chair alarm set  OT Visit Diagnosis: Other abnormalities of gait and mobility (R26.89);Muscle weakness (generalized) (M62.81)                Time: 1010-1033 OT Time Calculation (min): 23 min Charges:  OT General Charges $OT Visit: 1 Visit OT Evaluation $OT Re-eval: 1 Re-eval OT Treatments $Self Care/Home Management : 8-22 mins $Therapeutic Activity: 8-22 mins Darleen Crocker, MS, OTR/L , CBIS ascom [phone]  05/01/21, 1:14 PM

## 2021-05-01 NOTE — Progress Notes (Signed)
  Speech Language Pathology Treatment: Dysphagia  Patient Details Name: Victor Fox MRN: [id number] DOB: 1946/02/14 Today's Date: 05/01/2021 Time: 1657-9038 SLP Time Calculation (min) (ACUTE ONLY): 35 min  Assessment / Plan / Recommendation Clinical Impression  Pt appears to present w/ suspected pharyngeal phase dysphagia d/t overt clinical s/s of aspiration w/ thin liquids during this admit. Per MRI, pt has "Advanced atrophy and chronic small vessel ischemic changes". Unsure of pt's baseline Cognitive status. W/ modification of diet to Nectar consistency liquids w/ mech soft foods, pt demonstrates adequate oropharyngeal phase swallow w/ No overt, clinical s/s of aspiration during po trials. Pt appears at reduced risk for aspiration following general aspiration precautions and using a modified diet. Pt stated he has "coughed at home" when drinking thin liquids "for awhile" but was unclear of timeframe. No overt oral phase deficits noted; timely bolus management, mastication, and control of bolus propulsion for A-P transfer for swallowing. Oral clearing achieved w/ all trial consistencies. Pt fed self w/ setup support and verbal cues to "slow down" -- pt fed himself somewhat Impulsively w/out cues. Suspect impact from Cognitive decline/status. NSG reported same.   Recommend continue a Mech Soft consistency diet w/ well-Cut meats, moistened foods; Nectar liquids VIA CUP. Recommend general aspiration precautions -- SINGLE, SMALL SIPS, SLOWLY. Pills WHOLE in Puree for safer, easier swallowing. Education given on Pills in Puree; food consistencies and easy to eat options; general aspiration precautions. NSG updated/agreed.  Recommend f/u w/ ST services at SNF for all further needs w/ potential trials of thin liquids to upgrade diet when appropriate; objective swallow assessment when appropriate post recovery of illness.        HPI HPI: 75 y.o. male Covid Positive this admit with a past medical history  of significant diabetes mellitus type 2, hypertension, hyperlipidemia, neuropathy, vertigo, carotid artery disease, coronary artery disease, history of CVA, former smoker, who presented to ED with complaint of dizziness. He is a very poor historian and unable to provide much more detail. Currently mildly dizzy without focal neurologic deficits. 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 Unsure of pt's baseline Cognitive status.  Being followed by PT/OT this admit.. Chest x-ray also negative for any acute disease at admit.      SLP Plan  All goals met for safe toleration of modified diet; aspiration precautions. Needs continued Education on general aspiration precautions; need to lessen Impulsive drinking/eating.       Recommendations  Diet recommendations: Dysphagia 3 (mechanical soft);Nectar-thick liquid Liquids provided via: Cup;No straw Medication Administration: Whole meds with puree (for safer swallowing) Supervision: Patient able to self feed;Intermittent supervision to cue for compensatory strategies Compensations: Minimize environmental distractions;Slow rate;Small sips/bites;Lingual sweep for clearance of pocketing;Follow solids with liquid Postural Changes and/or Swallow Maneuvers: Out of bed for meals;Seated upright 90 degrees;Upright 30-60 min after meal                General recommendations:  (f/u w/ Rehab at [facility]) Oral Care Recommendations: Oral care BID;Oral care before and after PO;Staff/trained caregiver to provide oral care Follow up Recommendations: Skilled Nursing facility SLP Visit Diagnosis: Dysphagia, pharyngeal phase (R13.13) Plan: All goals met       GO                 Orinda Kenner, MS, CCC-SLP Speech Language Pathologist Rehab Services [phone] [facility] Ed 05/01/2021, 5:43 PM

## 2021-05-01 NOTE — Progress Notes (Signed)
Inpatient Diabetes Program Recommendations  AACE/ADA: New Consensus Statement on Inpatient Glycemic Control (2015)  Target Ranges:  Prepandial:   less than 140 mg/dL      Peak postprandial:   less than 180 mg/dL (1-2 hours)      Critically ill patients:  140 - 180 mg/dL   Lab Results  Component Value Date   GLUCAP 147 (H) 05/01/2021   HGBA1C 9.5 (H) 04/18/2021    Review of Glycemic Control Results for Victor Fox, Victor Fox (MRN [id number]) as of 05/01/2021 11:56  Ref. Range 04/30/2021 17:59 04/30/2021 18:02 04/30/2021 20:24 05/01/2021 09:19 05/01/2021 11:34  Glucose-Capillary Latest Ref Range: 70 - 99 mg/dL 328 (H) 334 (H) 296 (H) 186 (H) 147 (H)   Inpatient Diabetes Program Recommendations:   Consider increase in 70/30 pm dose of insulin to 20 units. Secure chat sent to Dr. Leslye Peer.  Thank you, Nani Gasser. Juanmiguel Defelice, RN, MSN, CDE  Diabetes Coordinator Inpatient Glycemic Control Team Team Pager [phone] (8am-5pm) 05/01/2021 11:57 AM

## 2021-05-01 NOTE — Progress Notes (Signed)
Physical Therapy Treatment Patient Details Name: Victor Fox MRN: [id number] DOB: [birth year] Today's Date: 05/01/2021    History of Present Illness 75 y.o. male with a past medical history of significant diabetes mellitus type 2, hypertension, hyperlipidemia, neuropathy, vertigo, carotid artery disease, coronary artery disease, history of CVA, former smoker, resented to the hospital with c/o unsteainess/falls starting 8/13, MRI shows ischemic infarct.    PT Comments    Consistent min manual cuing/facilitation for L ant/lateral weight shift with all standing and gait activities (to promote midline orientation and more normal movement mechanics).  Continues with limited insight into deficits, limited carry-over of cuing/education; requiring constant cuing, physical assist throughout session.     Follow Up Recommendations  SNF;Supervision for mobility/OOB     Equipment Recommendations  None recommended by PT    Recommendations for Other Services       Precautions / Restrictions Precautions Precautions: Fall Restrictions Weight Bearing Restrictions: No    Mobility  Bed Mobility Overal bed mobility: Needs Assistance Bed Mobility: Supine to Sit;Sit to Supine     Supine to sit: Min guard Sit to supine: Min guard   General bed mobility comments: seated in recliner beginning/end of treatment session    Transfers Overall transfer level: Needs assistance Equipment used: Rolling walker (2 wheeled) Transfers: Sit to/from Stand Sit to Stand: Min assist         General transfer comment: consistent cuing for hand placement, moderate use of rocking/momentum to initiate lift off  Ambulation/Gait Ambulation/Gait assistance: Min assist Gait Distance (Feet):  (100' x2) Assistive device: Rolling walker (2 wheeled)       General Gait Details: reciprocal stepping pattern, inconsistent L foot placement (suspect sensory/coordination deficits), inconsistent R foot clearance and  step length (inadequate L ant/lateral weigh shift).  Maintains RW arms-length anterior to patient; limited ability to participate with dynamic gait components (head turns, direction changes), min/mod assist to correct LOB   Stairs             Wheelchair Mobility    Modified Rankin (Stroke Patients Only)       Balance Overall balance assessment: Needs assistance Sitting-balance support: No upper extremity supported;Feet supported Sitting balance-Leahy Scale: Good Sitting balance - Comments: supervision sitting EOB   Standing balance support: Bilateral upper extremity supported Standing balance-Leahy Scale: Fair Standing balance comment: UE support and very unsteady                            Cognition Arousal/Alertness: Awake/alert Behavior During Therapy: WFL for tasks assessed/performed Overall Cognitive Status: No family/caregiver present to determine baseline cognitive functioning Area of Impairment: Safety/judgement                 Orientation Level: Disoriented to;Place;Situation       Safety/Judgement: Decreased awareness of safety;Decreased awareness of deficits     General Comments: Alert and oriented to self, location, situation; follows commands, but limited carry-over of new information, limited insight/safety awareness      Exercises Other Exercises Other Exercises: Unsupported standing, participated with bimanual reaching task, emphasis on L truncal elongation/R lateral trunk flexion and L ant/lateral weight shift. Min assist for balance, weight shift throughout.    General Comments        Pertinent Vitals/Pain Pain Assessment: No/denies pain    Home Living                      Prior Function  PT Goals (current goals can now be found in the care plan section) Acute Rehab PT Goals Patient Stated Goal: get stronger PT Goal Formulation: With patient Time For Goal Achievement: 05/02/21 Potential to  Achieve Goals: Good Progress towards PT goals: Progressing toward goals    Frequency    Min 2X/week      PT Plan Current plan remains appropriate    Co-evaluation              AM-PAC PT "6 Clicks" Mobility   Outcome Measure  Help needed turning from your back to your side while in a flat bed without using bedrails?: A Little Help needed moving from lying on your back to sitting on the side of a flat bed without using bedrails?: A Little Help needed moving to and from a bed to a chair (including a wheelchair)?: A Little Help needed standing up from a chair using your arms (e.g., wheelchair or bedside chair)?: A Little Help needed to walk in hospital room?: A Little Help needed climbing 3-5 steps with a railing? : A Lot 6 Click Score: 17    End of Session Equipment Utilized During Treatment: Gait belt Activity Tolerance: Patient tolerated treatment well Patient left: in chair;with call bell/phone within reach;with chair alarm set;with nursing/sitter in room Nurse Communication: Mobility status PT Visit Diagnosis: Muscle weakness (generalized) (M62.81);Difficulty in walking, not elsewhere classified (R26.2);Unsteadiness on feet (R26.81)     Time: [id number] PT Time Calculation (min) (ACUTE ONLY): 24 min  Charges:  $Gait Training: 8-22 mins $Neuromuscular Re-education: 8-22 mins                    Ellin Fitzgibbons H. Owens Shark, PT, DPT, NCS 05/01/21, 2:55 PM [phone]

## 2021-05-02 LAB — GLUCOSE, CAPILLARY
Glucose-Capillary: 160 mg/dL — ABNORMAL HIGH (ref 70–99)
Glucose-Capillary: 147 mg/dL — ABNORMAL HIGH (ref 70–99)
Glucose-Capillary: 167 mg/dL — ABNORMAL HIGH (ref 70–99)
Glucose-Capillary: 318 mg/dL — ABNORMAL HIGH (ref 70–99)
Glucose-Capillary: 337 mg/dL — ABNORMAL HIGH (ref 70–99)

## 2021-05-02 NOTE — TOC Progression Note (Addendum)
Transition of Care [facility]) - Progression Note    Patient Details  Name: Victor Fox MRN: [id number] Date of Birth: 12/04/1945  Transition of Care [facility]) CM/SW [place], RN Phone Number: 05/02/2021, 9:27 AM  Clinical Narrative:   [facility] confirmed they cannot accept admissions.  Patient had offers from [facility], [facility] and [facility].  Medicare ratings given to patient, [facility] was chosen.  RNCM ,left message for [facility] admitting, awaiting response.  [facility] caseworker notified.  TOC to follow to discharge.  Addendum:  [facility] rescinded bed offer, they state they will not have beds until mid next week.  [facility] notified, awaiting return call.       Expected Discharge Plan and Services                                                 Social Determinants of Health (SDOH) Interventions    Readmission Risk Interventions No flowsheet data found.

## 2021-05-02 NOTE — Progress Notes (Signed)
Physical Therapy Treatment Patient Details Name: Victor Fox MRN: [id number] DOB: 1946/06/17 Today's Date: 05/02/2021    History of Present Illness 75 y.o. male with a past medical history of significant diabetes mellitus type 2, hypertension, hyperlipidemia, neuropathy, vertigo, carotid artery disease, coronary artery disease, history of CVA, former smoker, resented to the hospital with c/o unsteainess/falls starting 8/13, MRI shows ischemic infarct.    PT Comments    Patient is agreeable to PT. Patient required Min A for transfers and Min A for ambulating 173f with rolling walker. Cues provided during mobility efforts for safety and during gait training for improved gait kinematics/safety with brief periods of carry over demonstrated.  Recommend to continue PT to maximize independence and decrease caregiver burden. SNF recommended at discharge.    Follow Up Recommendations  SNF;Supervision for mobility/OOB     Equipment Recommendations  None recommended by PT    Recommendations for Other Services       Precautions / Restrictions Precautions Precautions: Fall Restrictions Weight Bearing Restrictions: No    Mobility  Bed Mobility Overal bed mobility: Needs Assistance Bed Mobility: Supine to Sit;Sit to Supine     Supine to sit: Min guard Sit to supine: Min guard   General bed mobility comments: increased time required to complete tasks    Transfers Overall transfer level: Needs assistance Equipment used: Rolling walker (2 wheeled) Transfers: Sit to/from Stand Sit to Stand: Min assist         General transfer comment: verbal cues for hand placement and technique  Ambulation/Gait Ambulation/Gait assistance: Min assist Gait Distance (Feet): 100 Feet Assistive device: Rolling walker (2 wheeled) Gait Pattern/deviations: Decreased stride length;Trunk flexed;Decreased weight shift to left;Decreased step length - right Gait velocity: decreased   General Gait  Details: patient required faciliation to weight shift to left for advancement of RLE. moderate cues to stand closer to rolling walker with brief periods of carry over before requiring cues again for proper rolling walker placement.   Stairs             Wheelchair Mobility    Modified Rankin (Stroke Patients Only)       Balance   Sitting-balance support: No upper extremity supported;Feet supported Sitting balance-Leahy Scale: Good Sitting balance - Comments: supervision for sitting on edge of bed                                    Cognition Arousal/Alertness: Awake/alert Behavior During Therapy: WFL for tasks assessed/performed Overall Cognitive Status: No family/caregiver present to determine baseline cognitive functioning                                 General Comments: patient able to follow commands with increased time. limited inghight into physical deficits with decrease safety awareness overall      Exercises      General Comments        Pertinent Vitals/Pain Pain Assessment: No/denies pain    Home Living                      Prior Function            PT Goals (current goals can now be found in the care plan section) Acute Rehab PT Goals Patient Stated Goal: get stronger PT Goal Formulation: With patient Time For Goal Achievement: 05/16/21 Potential  to Achieve Goals: Good Progress towards PT goals: Progressing toward goals    Frequency    Min 2X/week      PT Plan Current plan remains appropriate    Co-evaluation              AM-PAC PT "6 Clicks" Mobility   Outcome Measure  Help needed turning from your back to your side while in a flat bed without using bedrails?: A Little Help needed moving from lying on your back to sitting on the side of a flat bed without using bedrails?: A Little Help needed moving to and from a bed to a chair (including a wheelchair)?: A Little Help needed standing up  from a chair using your arms (e.g., wheelchair or bedside chair)?: A Little Help needed to walk in hospital room?: A Little Help needed climbing 3-5 steps with a railing? : A Lot 6 Click Score: 17    End of Session Equipment Utilized During Treatment: Gait belt Activity Tolerance: Patient tolerated treatment well Patient left: in bed;with call bell/phone within reach;with bed alarm set Nurse Communication: Mobility status PT Visit Diagnosis: Muscle weakness (generalized) (M62.81);Difficulty in walking, not elsewhere classified (R26.2);Unsteadiness on feet (R26.81)     Time: [id number] PT Time Calculation (min) (ACUTE ONLY): 18 min  Charges:  $Gait Training: 8-22 mins                     Minna Merritts, PT, MPT    Percell Locus 05/02/2021, 1:24 PM

## 2021-05-02 NOTE — Progress Notes (Signed)
Patient ID: Victor Fox, male   DOB: 08/26/1946, 75 y.o.   MRN: [id number] [facility] PROGRESS NOTE  Victor Fox [id number] DOB: 18-Apr-1946 DOA: 04/17/2021 PCP: Patient, No Pcp Per (Inactive)  HPI/Subjective: Patient feeling okay and offers no complaints.  Initially admitted 15 days ago with dizziness and unsteady gait.  Patient found to have stroke with microhemorrhages.  Patient still feels a little weak.  Objective: Vitals:   05/02/21 1200 05/02/21 1700  BP: 125/69 137/65  Pulse: 64 60  Resp: 18 18  Temp: 98.4 F (36.9 C) 98.2 F (36.8 C)  SpO2: 97% 98%    Intake/Output Summary (Last 24 hours) at 05/02/2021 1728 Last data filed at 05/02/2021 1300 Gross per 24 hour  Intake 1240 ml  Output 650 ml  Net 590 ml   Filed Weights   04/17/21 1639 04/17/21 1643 05/01/21 1415  Weight: 90.7 kg 90.7 kg 89.9 kg    ROS: Review of Systems  Respiratory:  Negative for shortness of breath.   Cardiovascular:  Negative for chest pain.  Gastrointestinal:  Negative for abdominal pain, nausea and vomiting.  Exam: Physical Exam HENT:     Head: Normocephalic.     Mouth/Throat:     Pharynx: No oropharyngeal exudate.  Eyes:     General: Lids are normal.     Conjunctiva/sclera: Conjunctivae normal.     Pupils: Pupils are equal, round, and reactive to light.  Cardiovascular:     Rate and Rhythm: Normal rate and regular rhythm.     Heart sounds: S1 normal and S2 normal. Murmur heard.  Systolic murmur is present with a grade of 4/6.  Pulmonary:     Breath sounds: No decreased breath sounds, wheezing, rhonchi or rales.  Abdominal:     Palpations: Abdomen is soft.     Tenderness: There is no abdominal tenderness.  Musculoskeletal:     Right lower leg: No swelling.     Left lower leg: No swelling.  Skin:    General: Skin is warm.     Findings: No rash.  Neurological:     Mental Status: He is alert.     Comments: Answers questions appropriately.  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  Neurology recommended aspirin and Plavix for 21 days together then aspirin alone after that.  Zio patch upon discharge to monitor for arrhythmias.  Echocardiogram showed moderate to severe aortic stenosis with normal EF.  Continue atorvastatin.  LDL 103.  Physical therapy recommending rehab. Incidental COVID-19 infection.  Out of isolation at this point Acute on chronic dizziness secondary to strokes and orthostatic hypotension with aortic stenosis.  Patient is on enalapril. Type 2 diabetes mellitus with hyperlipidemia unspecified on atorvastatin.  Patient also on 70/30 insulin.  15 units in the morning and 20 units in evening with short acting insulin prior to meals. History of CAD on aspirin and Plavix Chronic diastolic congestive heart failure.  No signs of heart failure currently.      Code Status:     Code Status Orders  (From admission, onward)           Start     Ordered   04/17/21 2044  Full code  Continuous        04/17/21 2046           Code Status History     Date Active Date Inactive Code Status Order ID Comments User Context   10/18/2019 2341 10/20/2019 2012 Full Code [id number]  Sidney Ace Arvella Merles, MD ED   09/01/2018 1947 09/03/2018 1808 Full Code [id number]  Saundra Shelling, MD ED      Advance Directive Documentation    Flowsheet Row Most Recent Value  Type of Advance Directive Living will  Pre-existing out of facility DNR order (yellow form or pink MOST  form) --  "MOST" Form in Place? --      Family Communication: Spoke with friend [name] on the phone Disposition Plan: Status is: Inpatient  Dispo: The patient is from: Home              Anticipated d/c is to: Rehab              Patient currently medically stable to go out to rehab.  Patient has excepted a facility and awaiting insurance authorization.   Difficult to place patient.  Yes.  Time spent: 28 minutes  [place]

## 2021-05-02 NOTE — TOC Progression Note (Signed)
Transition of Care [facility]) - Progression Note    Patient Details  Name: KASRA HELLBERG MRN: [id number] Date of Birth: 1946/07/10  Transition of Care [facility]) CM/SW [place], RN Phone Number: 05/02/2021, 3:38 PM  Clinical Narrative:   [facility] accepted patient for STR.  Authorization in progress, awaiting response.         Expected Discharge Plan and Services                                                 Social Determinants of Health (SDOH) Interventions    Readmission Risk Interventions No flowsheet data found.

## 2021-05-03 ENCOUNTER — Other Ambulatory Visit: Payer: Self-pay | Admitting: Physician Assistant

## 2021-05-03 ENCOUNTER — Inpatient Hospital Stay
Admit: 2021-05-03 | Discharge: 2021-05-03 | Disposition: A | Discharge status: Home / Self Care | Payer: Medicare Other | Attending: Physician Assistant | Admitting: Physician Assistant

## 2021-05-03 DIAGNOSIS — I639 Cerebral infarction, unspecified: Secondary | ICD-10-CM

## 2021-05-03 LAB — GLUCOSE, CAPILLARY
Glucose-Capillary: 111 mg/dL — ABNORMAL HIGH (ref 70–99)
Glucose-Capillary: 170 mg/dL — ABNORMAL HIGH (ref 70–99)
Glucose-Capillary: 270 mg/dL — ABNORMAL HIGH (ref 70–99)
Glucose-Capillary: 217 mg/dL — ABNORMAL HIGH (ref 70–99)

## 2021-05-03 NOTE — Progress Notes (Signed)
Patient ID: Victor Fox, male   DOB: 1945/11/09, 75 y.o.   MRN: [id number] [facility] PROGRESS NOTE  Victor Fox [id number] DOB: 04-03-1946 DOA: 04/17/2021 PCP: Patient, No Pcp Per (Inactive)  HPI/Subjective: Patient feels well and offers no complaints.  Looking forward to getting out of the hospital.  Awaiting insurance authorization for rehab.  Initially admitted for stroke and then found to be incidental COVID-positive.  Objective: Vitals:   05/03/21 0900 05/03/21 1224  BP: (!) 176/75 (!) 149/59  Pulse: 71 60  Resp: 18 18  Temp: 98.1 F (36.7 C) 98.6 F (37 C)  SpO2: 100% 98%    Intake/Output Summary (Last 24 hours) at 05/03/2021 1812 Last data filed at 05/03/2021 1300 Gross per 24 hour  Intake 240 ml  Output 1150 ml  Net -910 ml   Filed Weights   04/17/21 1639 04/17/21 1643 05/01/21 1415  Weight: 90.7 kg 90.7 kg 89.9 kg    ROS: Review of Systems  Respiratory:  Negative for shortness of breath.   Cardiovascular:  Negative for chest pain.  Gastrointestinal:  Negative for abdominal pain, nausea and vomiting.  Exam: Physical Exam HENT:     Head: Normocephalic.     Mouth/Throat:     Pharynx: No oropharyngeal exudate.  Eyes:     General: Lids are normal.     Conjunctiva/sclera: Conjunctivae normal.     Pupils: Pupils are equal, round, and reactive to light.  Cardiovascular:     Rate and Rhythm: Normal rate and regular rhythm.     Heart sounds: Normal heart sounds, S1 normal and S2 normal.  Pulmonary:     Breath sounds: No decreased breath sounds, wheezing, rhonchi or rales.  Abdominal:     Palpations: Abdomen is soft.     Tenderness: There is no abdominal tenderness.  Musculoskeletal:     Right lower leg: No swelling.     Left lower leg: No swelling.  Skin:    General: Skin is warm.     Findings: No rash.  Neurological:     Mental Status: He is alert.     Comments: Answers questions appropriately.  Able to straight leg raise.      Scheduled  Meds: . aspirin EC  81 mg Oral Daily  . atorvastatin  80 mg Oral Daily  . clopidogrel  75 mg Oral Daily  . enalapril  10 mg Oral Daily  . enoxaparin (LOVENOX) injection  40 mg Subcutaneous Q24H  . insulin aspart  0-9 Units Subcutaneous TID WC  . insulin aspart protamine- aspart  15 Units Subcutaneous Q breakfast  . 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  Neurology recommended aspirin and Plavix for 21 days total and then aspirin alone after that.  Zio patch applied to monitor for arrhythmias echocardiogram showed moderate to severe aortic stenosis with normal EF.  Continue atorvastatin.  LDL 103.  Physical therapy recommending rehab.  Still awaiting insurance authorization. Incidental COVID-19 infection completed 10 days of isolation and is out of isolation at this point. Acute on chronic dizziness secondary to strokes and orthostatic hypotension with severe aortic stenosis.  Continue to monitor blood pressure.  Patient is on enalapril. Type 2 diabetes mellitus with hyperlipidemia unspecified on atorvastatin.  Patient on 70/30 insulin.  15 units in the morning and 20 units in the evening with short acting insulin prior to meals.  History of CAD on aspirin and Plavix currently History of chronic diastolic congestive heart failure.  No signs of heart failure currently.   Code Status:     Code Status Orders  (From admission, onward)           Start     Ordered   04/17/21 2044  Full code  Continuous        04/17/21 2046           Code Status History     Date Active Date Inactive Code Status Order ID Comments User Context   10/18/2019 2341 10/20/2019 2012 Full Code [id number]  Sidney Ace Arvella Merles, MD ED   09/01/2018 1947 09/03/2018 1808 Full Code [id number]  Saundra Shelling, MD ED      Advance Directive Documentation    [place] Most  Recent Value  Type of Advance Directive Living will  Pre-existing out of facility DNR order (yellow form or pink MOST form) --  "MOST" Form in Place? --      Disposition Plan: Status is: Inpatient  Dispo: The patient is from: Home              Anticipated d/c is to: Rehab              Patient currently medically stable to go out to rehab once insurance authorization obtained.  Unlikely to happen on the holiday weekend.   Difficult to place patient.  Yes.  Time spent: 25 minutes  [place]

## 2021-05-03 NOTE — Progress Notes (Addendum)
Speech Language Pathology Treatment: Dysphagia  Patient Details Name: Victor Fox MRN: [id number] DOB: [birth year] Today's Date: 05/03/2021 Time: 2536-6440 SLP Time Calculation (min) (ACUTE ONLY): 25 min  Assessment / Plan / Recommendation Clinical Impression  Pt seen again today for assessment of safety of swallowing; toleration of current po's. Pt appeared min disheveled in bed and required Min+ verbal cues to organize himself sitting upright in bed for po's -- pt was going to remain lying reclined somewhat to drink liquids.  Pt appears to present w/ suspected pharyngeal phase dysphagia d/t overt clinical s/s of aspiration w/ thin liquids during this admit. Per MRI, pt has "Advanced atrophy and chronic small vessel ischemic changes". Unsure of pt's baseline Cognitive status. W/ modification of diet to Nectar consistency liquids w/ mech soft foods, pt demonstrates adequate oropharyngeal phase swallow w/ No overt, clinical s/s of aspiration during po trials. NSG reported good oral intake at all meals w/ no consistent, overt coughing(chart notes reviewed). Pt appears at reduced risk for aspiration following general aspiration precautions and using a modified diet. Pt stated he has "coughed at home" when drinking thin liquids "for awhile" but was unclear of timeframe. No overt coughing noted w/ sips of Nectar liquids via straw this session(pt requested a Coke, thickened). Timely bolus management and control of bolus propulsion for A-P transfer for swallowing. Oral clearing achieved. Pt fed self w/ setup support and verbal cues to "slow down" -- pt fed himself somewhat Impulsively w/out cues. Suspect impact from Cognitive decline/status. NSG reported same.    Recommend continue a Mech Soft consistency diet w/ well-Cut meats, moistened foods; Nectar liquids.  Recommend general aspiration precautions -- SMALL SIPS, SLOWLY. Pills WHOLE in Puree for safer, easier swallowing. Education given on Pills in Puree;  food consistencies and easy to eat options; general aspiration precautions. NSG updated/agreed.  Recommend f/u w/ ST services at SNF for all further needs w/ potential trials of thin liquids to upgrade diet when appropriate; objective swallow assessment when appropriate post recovery of illness if indicated.       HPI HPI: 75 y.o. male Covid Positive this admit with a past medical history of significant diabetes mellitus type 2, hypertension, hyperlipidemia, neuropathy, vertigo, carotid artery disease, coronary artery disease, history of CVA, former smoker, who presented to ED with complaint of dizziness. He is a very poor historian and unable to provide much more detail. Currently mildly dizzy without focal neurologic deficits. 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 Unsure of pt's baseline Cognitive status.  Being followed by PT/OT this admit.. Chest x-ray also negative for any acute disease at admit.      SLP Plan  All goals met (for Inpt care)       Recommendations  Diet recommendations: Dysphagia 3 (mechanical soft);Nectar-thick liquid Liquids provided via: Cup;Straw (monitor straw use) Medication Administration: Whole meds with puree (for safer swallowing) Supervision: Patient able to self feed;Intermittent supervision to cue for compensatory strategies Compensations: Minimize environmental distractions;Slow rate;Small sips/bites;Lingual sweep for clearance of pocketing;Multiple dry swallows after each bite/sip;Follow solids with liquid Postural Changes and/or Swallow Maneuvers: Out of bed for meals;Seated upright 90 degrees;Upright 30-60 min after meal                General recommendations:  (Dietician f/u) Oral Care Recommendations: Oral care BID;Oral care before and after PO;Staff/trained caregiver to provide oral care Follow up Recommendations: Skilled Nursing facility SLP  Visit Diagnosis:  Dysphagia, pharyngeal phase (R13.13) Plan: All goals met (for Inpt care)       GO                 Orinda Kenner, MS, CCC-SLP Speech Language Pathologist Rehab Services [phone] [facility] 05/03/2021, 1:20 PM

## 2021-05-03 NOTE — TOC Progression Note (Signed)
Transition of Care [facility]) - Progression Note    Patient Details  Name: Victor Fox MRN: [id number] Date of Birth: 1946/04/03  Transition of Care [facility]) CM/SW [place], RN Phone Number: 05/03/2021, 2:39 PM  Clinical Narrative:   Wandra Feinstein continues to await authorization.  Patient can transfer upon auth, but will need monitoring (see notes).  [place] will contact rNCM when auth received.  They can take patient on weekend.  TOC to follow         Expected Discharge Plan and Services           Expected Discharge Date: 05/03/21                                     Social Determinants of Health (SDOH) Interventions    Readmission Risk Interventions No flowsheet data found.

## 2021-05-03 NOTE — Therapy (Signed)
Zio patch was placed on patient due to pending discharge to SNF.  Pt verbalized understanding of leaving the monitor on for 14 days and keeping the gateway monitor case within 10 feet of him at all times.  Information booklets and flyer left at bedside for information to staff.   Lucious Groves, RN was made aware of Zio placement.  The zio company should be called with any equipment questions.

## 2021-05-03 NOTE — Care Management Important Message (Signed)
Important Message  Patient Details  Name: Victor Fox MRN: [id number] Date of Birth: 06-Sep-1945   Medicare Important Message Given:  Yes     Dannette Barbara 05/03/2021, 11:18 AM

## 2021-05-03 NOTE — Progress Notes (Signed)
Order placed for Live Zio AT x2 weeks for CVA to be placed before discharge.

## 2021-05-04 NOTE — Progress Notes (Signed)
Patient ID: Victor Fox, male   DOB: [birth year], 75 y.o.   MRN: [id number] [facility] PROGRESS NOTE  CANAN LANDGRAF [id number] DOB: 01/01/1946 DOA: 04/17/2021 PCP: Patient, No Pcp Per (Inactive)  HPI/Subjective: Patient feels well.  Offers no complaints.  Initially admitted for stroke and then found to have incidental COVID positivity.  Objective: Vitals:   05/04/21 0818 05/04/21 1226  BP: (!) 165/74 131/63  Pulse: (!) 57 65  Resp: 16 16  Temp: (!) 97.5 F (36.4 C) 97.7 F (36.5 C)  SpO2: 97% 96%    Intake/Output Summary (Last 24 hours) at 05/04/2021 1541 Last data filed at 05/04/2021 1300 Gross per 24 hour  Intake 360 ml  Output 1250 ml  Net -890 ml   Filed Weights   04/17/21 1639 04/17/21 1643 05/01/21 1415  Weight: 90.7 kg 90.7 kg 89.9 kg    ROS: Review of Systems  Respiratory:  Negative for shortness of breath.   Cardiovascular:  Negative for chest pain.  Gastrointestinal:  Negative for abdominal pain, nausea and vomiting.  Exam: Physical Exam HENT:     Head: Normocephalic.     Mouth/Throat:     Pharynx: No oropharyngeal exudate.  Eyes:     General: Lids are normal.     Conjunctiva/sclera: Conjunctivae normal.  Cardiovascular:     Rate and Rhythm: Normal rate and regular rhythm.     Heart sounds: S1 normal and S2 normal. Murmur heard.  Systolic murmur is present with a grade of 4/6.  Pulmonary:     Breath sounds: Normal breath sounds. No decreased breath sounds, wheezing, rhonchi or rales.  Abdominal:     Palpations: Abdomen is soft.     Tenderness: There is no abdominal tenderness.  Musculoskeletal:     Right lower leg: No swelling.     Left lower leg: No swelling.  Skin:    General: Skin is warm.     Findings: No rash.  Neurological:     Mental Status: He is alert.     Comments: Answers questions appropriately.  Able to straight leg raise.      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  Continue aspirin and Plavix for 21 days total and then aspirin alone after that.  Zio patch applied.  Echocardiogram showed moderate to severe aortic stenosis with normal EF.  LDL 103 on atorvastatin.  Physical therapy recommending rehab.  Awaiting insurance authorization. Incidental COVID-19 infection.  Patient has been out of isolation Acute on chronic dizziness secondary to strokes and orthostatic hypotension with aortic stenosis. Type 2 diabetes mellitus with hyperlipidemia unspecified on atorvastatin.  On 15 units of 70/30 insulin in the morning and 20 units in the evening.  Short acting insulin prior to meals. History of CAD on aspirin and Plavix and atorvastatin Chronic diastolic congestive heart failure no signs of heart failure currently.        Code Status:     Code Status Orders  (From admission,  onward)           Start     Ordered   04/17/21 2044  Full code  Continuous        04/17/21 2046           Code Status History     Date Active Date Inactive Code Status Order ID Comments User Context   10/18/2019 2341 10/20/2019 2012 Full Code [id number]  Sidney Ace Arvella Merles, MD ED   09/01/2018 1947 09/03/2018 1808 Full Code [id number]  Saundra Shelling, MD ED      Advance Directive Documentation    [place] Most Recent Value  Type of Advance Directive Living will  Pre-existing out of facility DNR order (yellow form or pink MOST form) --  "MOST" Form in Place? --      Disposition Plan: Status is: Inpatient  Dispo: The patient is from: Home               Anticipated d/c is to: Rehab              Patient currently medically stable   Difficult to place patient.  Yes.  I was told we have the bed but awaiting insurance authorization.  Time spent: 25 minutes  [place]

## 2021-05-05 DIAGNOSIS — R4189 Other symptoms and signs involving cognitive functions and awareness: Secondary | ICD-10-CM

## 2021-05-05 NOTE — Plan of Care (Signed)
  Problem: Education: Goal: Knowledge of General Education information will improve Description: Including pain rating scale, medication(s)/side effects and non-pharmacologic comfort measures 05/05/2021 2352 by Lavonia Jefferie, RN Outcome: Progressing 05/05/2021 2348 by Lavonia Bora, RN Outcome: Progressing   Problem: Education: Goal: Knowledge of risk factors and measures for prevention of condition will improve 05/05/2021 2352 by Lavonia Tami, RN Outcome: Progressing 05/05/2021 2348 by Lavonia Muhanad, RN Outcome: Progressing   Problem: Ischemic Stroke/TIA Tissue Perfusion: Goal: Complications of ischemic stroke/TIA will be minimized 05/05/2021 2352 by Lavonia Kabir, RN Outcome: Progressing 05/05/2021 2348 by Lavonia Kamilo, RN Outcome: Progressing   Problem: Nutrition: Goal: Risk of aspiration will decrease 05/05/2021 2352 by Lavonia Sayre, RN Outcome: Progressing 05/05/2021 2348 by Lavonia Kane, RN Outcome: Progressing

## 2021-05-05 NOTE — Progress Notes (Signed)
Physical Therapy Treatment Patient Details Name: Victor Fox MRN: [id number] DOB: 03-28-1946 Today's Date: 05/05/2021    History of Present Illness 75 y.o. male with a past medical history of significant diabetes mellitus type 2, hypertension, hyperlipidemia, neuropathy, vertigo, carotid artery disease, coronary artery disease, history of CVA, former smoker, resented to the hospital with c/o unsteainess/falls starting 8/13, MRI shows ischemic infarct.    PT Comments    Pt tolerated treatment well today. Pt able to improve overall ambulation distance since last session, noting improvements with quality of gait and carry over with repetition. However, due to poor safety awareness pt required max multimodal cues for safety, sequencing, and RW proximity/negotiation with upright mobility. Despite progress, pt continues to be limited with meeting goals due to poor safety awareness, impaired motor control/planning/grading, decreased balance, decreased activity tolerance, and poor insight into deficits. Pt will continue to benefit from skilled acute PT services to address deficits for return to baseline function. Will continue to recommend SNF at DC.     Follow Up Recommendations  SNF;Supervision for mobility/OOB     Equipment Recommendations  None recommended by PT       Precautions / Restrictions Precautions Precautions: Fall Restrictions Weight Bearing Restrictions: No    Mobility  Bed Mobility Overal bed mobility: Needs Assistance Bed Mobility: Supine to Sit     Supine to sit: Supervision;HOB elevated     General bed mobility comments: Supervision for safety, with increased time/effort for mobility.    Transfers Overall transfer level: Needs assistance Equipment used: Rolling walker (2 wheeled) Transfers: Sit to/from Stand Sit to Stand: Min assist;Mod assist         General transfer comment: Min assist for power to stand from EOB; mod assist for power to stand from  recliner. with RW. increased cueing for hand placement and sequencing.  Ambulation/Gait Ambulation/Gait assistance: Min assist;Min guard Gait Distance (Feet): 450 Feet (160f x3) Assistive device: Rolling walker (2 wheeled)       General Gait Details: Initially min assist, requiring increased cueing for L lateral weight shift for RLE facilitation. Progressing to [place] with time. Max verbal cues throughout session for RW proximity, big step lengths, and attention to task. Improved quality of gait and carry over noted with repetition.     Balance Overall balance assessment: Needs assistance Sitting-balance support: No upper extremity supported;Feet supported Sitting balance-Leahy Scale: Good Sitting balance - Comments: supervision for sitting on edge of bed   Standing balance support: Bilateral upper extremity supported;During functional activity Standing balance-Leahy Scale: Fair Standing balance comment: UE support and very unsteady      Cognition Arousal/Alertness: Awake/alert Behavior During Therapy: WFL for tasks assessed/performed Overall Cognitive Status: No family/caregiver present to determine baseline cognitive functioning Area of Impairment: Safety/judgement        Safety/Judgement: Decreased awareness of safety;Decreased awareness of deficits     General Comments: patient able to follow commands with increased time. limited inghight into physical deficits with decrease safety awareness overall      Exercises Other Exercises Other Exercises: Able to participate in bed mobility, transfers, and gait with RW. Emphasis on quality and safety with gait in RW. Improved carry over noted within session, with repetition and cueing. Other Exercises: Pt educated regarding: PT role, safety with mobility, RW negotiation/management.        Pertinent Vitals/Pain Pain Assessment: No/denies pain     PT Goals (current goals can now be found in the care plan section)  Acute Rehab PT  Goals Patient Stated Goal: get stronger PT Goal Formulation: With patient Time For Goal Achievement: 05/16/21 Potential to Achieve Goals: Good Progress towards PT goals: Progressing toward goals    Frequency    Min 2X/week      PT Plan Current plan remains appropriate       AM-PAC PT "6 Clicks" Mobility   Outcome Measure  Help needed turning from your back to your side while in a flat bed without using bedrails?: A Little Help needed moving from lying on your back to sitting on the side of a flat bed without using bedrails?: A Little Help needed moving to and from a bed to a chair (including a wheelchair)?: A Little Help needed standing up from a chair using your arms (e.g., wheelchair or bedside chair)?: A Little Help needed to walk in hospital room?: A Little Help needed climbing 3-5 steps with a railing? : A Lot 6 Click Score: 17    End of Session Equipment Utilized During Treatment: Gait belt Activity Tolerance: Patient tolerated treatment well Patient left: with call bell/phone within reach;with chair alarm set;in chair Nurse Communication: Mobility status (pt requesting lunch) PT Visit Diagnosis: Muscle weakness (generalized) (M62.81);Difficulty in walking, not elsewhere classified (R26.2);Unsteadiness on feet (R26.81)     Time: [id number] PT Time Calculation (min) (ACUTE ONLY): 24 min  Charges:  $Gait Training: 23-37 mins                     Herminio Commons, PT, DPT 12:54 PM,05/05/21

## 2021-05-05 NOTE — Progress Notes (Signed)
Patient ID: Victor Fox, male   DOB: 02-08-1946, 75 y.o.   MRN: [id number] [facility] PROGRESS NOTE  Victor Fox [id number] DOB: Oct 23, 1945 DOA: 04/17/2021 PCP: Patient, No Pcp Per (Inactive)  HPI/Subjective: Patient awakened from sleep.  Offers no complaints.  Initially admitted 17 days ago with dizziness and gait disturbance and found to have a stroke.   Objective: Vitals:   05/05/21 0746 05/05/21 1123  BP: (!) 174/69 (!) 131/48  Pulse: 60 (!) 53  Resp: 17 19  Temp: 98.1 F (36.7 C) 98.3 F (36.8 C)  SpO2: 98% 98%    Intake/Output Summary (Last 24 hours) at 05/05/2021 1313 Last data filed at 05/05/2021 1139 Gross per 24 hour  Intake 360 ml  Output 1725 ml  Net -1365 ml   Filed Weights   04/17/21 1639 04/17/21 1643 05/01/21 1415  Weight: 90.7 kg 90.7 kg 89.9 kg    ROS: Review of Systems  Respiratory:  Negative for shortness of breath.   Cardiovascular:  Negative for chest pain.  Gastrointestinal:  Negative for abdominal pain, nausea and vomiting.  Exam: Physical Exam HENT:     Head: Normocephalic.     Mouth/Throat:     Pharynx: No oropharyngeal exudate.  Eyes:     General: Lids are normal.     Conjunctiva/sclera: Conjunctivae normal.     Pupils: Pupils are equal, round, and reactive to light.  Cardiovascular:     Rate and Rhythm: Normal rate and regular rhythm.     Heart sounds: Normal heart sounds, S1 normal and S2 normal.  Pulmonary:     Breath sounds: No decreased breath sounds, wheezing, rhonchi or rales.  Abdominal:     Palpations: Abdomen is soft.     Tenderness: There is no abdominal tenderness.  Musculoskeletal:     Right lower leg: No swelling.     Left lower leg: No swelling.  Skin:    General: Skin is warm.     Findings: No rash.  Neurological:     Mental Status: He is alert.     Comments: Answers questions appropriately.  Able to straight leg raise bilaterally.      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  As per neurology, continue aspirin and Plavix for 21 days total then aspirin alone after that.  Zio patch applied to rule out arrhythmia.  Echocardiogram showed moderate to severe aortic stenosis with normal EF.  Continue atorvastatin.  LDL 103.  Physical therapy recommending rehab.  Awaiting insurance authorization. Cognitive impairment.  Patient with poor safety awareness, impaired motor control and planning, decreased balance.  Physical therapy still recommending rehab.  Patient lives alone and unsafe discharge home but will be good to go out to rehab. Incidental COVID-19 infection.  Out of isolation at this point Acute on chronic dizziness secondary to strokes and orthostatic hypotension and severe aortic stenosis.  Patient on enalapril. Type 2 diabetes mellitus with hyperlipidemia on atorvastatin.  Continue 70/30 insulin.  On 15 units in the morning and 20 units in the evening.  Continue short acting insulin prior to meals. History of CAD on aspirin and Plavix currently Chronic diastolic congestive heart failure.  No signs of heart failure currently    Code Status:     Code Status Orders  (From admission, onward)           Start     Ordered   04/17/21 2044  Full code  Continuous        04/17/21 2046           Code Status History     Date Active Date Inactive Code Status Order ID Comments User Context   10/18/2019 2341 10/20/2019 2012 Full Code [id number]  Sidney Ace Arvella Merles, MD ED   09/01/2018 1947 09/03/2018 1808 Full Code [id number]  Saundra Shelling, MD ED      Advance Directive Documentation    [place] Most Recent Value  Type of Advance Directive Living will  Pre-existing out of facility DNR order (yellow form or pink MOST form) --  "MOST" Form in Place? --      Family Communication: Left message for friend Disposition Plan: Status is: Inpatient  Dispo: The patient is from: Home              Anticipated d/c is to: Rehab (waiting for insurance authorization which will not happen on the holiday weekend)              Patient currently medically stable   Difficult to place patient.  Yes.  Time spent: 26 minutes  [place]

## 2021-05-06 NOTE — Progress Notes (Signed)
Patient ID: Victor Fox, male   DOB: 1946-03-10, 75 y.o.   MRN: [id number] [facility] PROGRESS NOTE  Victor Fox [id number] DOB: [birth year] DOA: 04/17/2021 PCP: Patient, No Pcp Per (Inactive)  HPI/Subjective: Patient feels okay.  Offers no complaints.  Admitted 18 days ago with dizziness and gait disturbance and found to have strokes.  Awaiting rehab bed.  Objective: Vitals:   05/06/21 0511 05/06/21 0814  BP: (!) 172/72 (!) 176/70  Pulse: (!) 57 (!) 57  Resp: 18 16  Temp: 97.8 F (36.6 C) 98 F (36.7 C)  SpO2: 98% 99%    Intake/Output Summary (Last 24 hours) at 05/06/2021 1125 Last data filed at 05/06/2021 1042 Gross per 24 hour  Intake 358 ml  Output 2050 ml  Net -1692 ml   Filed Weights   04/17/21 1639 04/17/21 1643 05/01/21 1415  Weight: 90.7 kg 90.7 kg 89.9 kg    ROS: Review of Systems  Respiratory:  Negative for shortness of breath.   Cardiovascular:  Negative for chest pain.  Gastrointestinal:  Negative for abdominal pain, nausea and vomiting.  Exam: Physical Exam HENT:     Head: Normocephalic.     Mouth/Throat:     Pharynx: No oropharyngeal exudate.  Eyes:     General: Lids are normal.     Conjunctiva/sclera: Conjunctivae normal.  Cardiovascular:     Rate and Rhythm: Normal rate and regular rhythm.     Heart sounds: S1 normal and S2 normal. Murmur heard.  Systolic murmur is present with a grade of 4/6.  Pulmonary:     Breath sounds: No decreased breath sounds, wheezing, rhonchi or rales.  Abdominal:     Palpations: Abdomen is soft.     Tenderness: There is no abdominal tenderness.  Musculoskeletal:     Right lower leg: No swelling.     Left lower leg: No swelling.  Skin:    General: Skin is warm.     Findings: No rash.  Neurological:     Mental Status: He is alert.     Comments: Able to straight leg raise.      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  Continue aspirin and Plavix for 21 days.  Zio patch applied.  Echocardiogram shows moderate to severe aortic stenosis.  On atorvastatin.  LDL 103.  Awaiting to go out to rehab and awaiting insurance authorization.   Cognitive impairment.  Patient with poor safety awareness impaired motor control and planning and decreased balance.  Physical therapy recommending rehab with unsafe discharge home. Incidental COVID-19 infection.  Out of isolation. Acute on chronic dizziness secondary to strokes and orthostatic hypotension with severe aortic stenosis.  Patient on enalapril. Type 2 diabetes mellitus with hyperlipidemia.  Continue atorvastatin.  Patient on 70/30 insulin 15 units in the morning and 20 units in evening. History of CAD.  Continue aspirin and Plavix Chronic diastolic congestive heart failure.  No signs of heart failure at this point  Code Status:     Code Status Orders  (From admission, onward)           Start     Ordered   04/17/21 2044  Full code  Continuous        04/17/21 2046           Code Status History     Date Active Date Inactive Code Status Order ID Comments User Context   10/18/2019 2341 10/20/2019 2012 Full Code [id number]  Sidney Ace Arvella Merles, MD ED   09/01/2018 1947 09/03/2018 1808 Full Code [id number]  Saundra Shelling, MD ED      Advance Directive Documentation    [place] Most Recent Value  Type of Advance Directive Living will  Pre-existing out of facility DNR order (yellow form or pink MOST form) --  "MOST" Form in Place? --      Family Communication: Spoke  with friend yesterday Disposition Plan: Status is: Inpatient  Dispo: The patient is from: Home              Anticipated d/c is to: Rehab (waiting for insurance authorization which will not happen on the holiday weekend)              Patient currently medically stable   Difficult to place patient.  Yes.  Time spent: 25 minutes  [place]

## 2021-05-06 NOTE — Progress Notes (Signed)
While bathing patient, NT called about concerns about patient's condom cath.  It had rolled down on the head of his penis.  We removed the condom cath and found a blister and abrasion on the underside of his penis.  Area was cleaned and skin protective ointment applied.  Patient educated on using a urinal as condom cath will not be replaced.  MD notified.  Will continue to monitor.

## 2021-05-06 NOTE — TOC Progression Note (Signed)
Transition of Care [facility]) - Progression Note    Patient Details  Name: Victor Fox MRN: [id number] Date of Birth: 12/12/1945  [facility]) CM/SW Contact  Shelbie Hutching, RN Phone Number: 05/06/2021, 1:26 PM  Clinical Narrative:    Damaris Schooner with Shirlee Limerick at [facility], she has not heard back from [facility] about  [place].  RNCM attempted to call [employer] to find out about auth but [facility] is closed today.  TOC will follow up tomorrow.         Expected Discharge Plan and Services           Expected Discharge Date: 05/03/21                                     Social Determinants of Health (SDOH) Interventions    Readmission Risk Interventions No flowsheet data found.

## 2021-05-07 LAB — GLUCOSE, CAPILLARY
Glucose-Capillary: 146 mg/dL — ABNORMAL HIGH (ref 70–99)
Glucose-Capillary: 204 mg/dL — ABNORMAL HIGH (ref 70–99)
Glucose-Capillary: 241 mg/dL — ABNORMAL HIGH (ref 70–99)
Glucose-Capillary: 224 mg/dL — ABNORMAL HIGH (ref 70–99)
Glucose-Capillary: 195 mg/dL — ABNORMAL HIGH (ref 70–99)
Glucose-Capillary: 145 mg/dL — ABNORMAL HIGH (ref 70–99)
Glucose-Capillary: 240 mg/dL — ABNORMAL HIGH (ref 70–99)
Glucose-Capillary: 163 mg/dL — ABNORMAL HIGH (ref 70–99)
Glucose-Capillary: 147 mg/dL — ABNORMAL HIGH (ref 70–99)
Glucose-Capillary: 240 mg/dL — ABNORMAL HIGH (ref 70–99)
Glucose-Capillary: 291 mg/dL — ABNORMAL HIGH (ref 70–99)
Glucose-Capillary: 265 mg/dL — ABNORMAL HIGH (ref 70–99)
Glucose-Capillary: 176 mg/dL — ABNORMAL HIGH (ref 70–99)
Glucose-Capillary: 217 mg/dL — ABNORMAL HIGH (ref 70–99)

## 2021-05-07 NOTE — Progress Notes (Signed)
Patient ID: KRYSTIAN POLISENO, male   DOB: [birth year], 75 y.o.   MRN: [id number] [facility] PROGRESS NOTE  ANAS KEYE [id number] DOB: 29-Aug-1946 DOA: 04/17/2021 PCP: Patient, No Pcp Per (Inactive)  HPI/Subjective: Patient admitted 19 days ago with dizziness and gait disturbance and found to have strokes.  Patient feels okay and offers no complaints interested in getting out of the hospital.  Currently awaiting insurance authorization to go out to rehab bed.  This morning did cough after eating a dry biscuit.  Objective: Vitals:   05/07/21 0839 05/07/21 1108  BP: (!) 147/66 139/65  Pulse: 65 63  Resp: 16 17  Temp: 98.1 F (36.7 C) 98.3 F (36.8 C)  SpO2: 97% 97%     Filed Weights   04/17/21 1639 04/17/21 1643 05/01/21 1415  Weight: 90.7 kg 90.7 kg 89.9 kg    ROS: Review of Systems  Respiratory:  Positive for cough.   Cardiovascular:  Negative for chest pain.  Gastrointestinal:  Negative for abdominal pain, nausea and vomiting.  Exam: Physical Exam HENT:     Head: Normocephalic.     Mouth/Throat:     Pharynx: No oropharyngeal exudate.  Eyes:     General: Lids are normal.     Conjunctiva/sclera: Conjunctivae normal.  Cardiovascular:     Rate and Rhythm: Normal rate and regular rhythm.     Heart sounds: Normal heart sounds, S1 normal and S2 normal.  Pulmonary:     Breath sounds: Normal breath sounds. No decreased breath sounds, wheezing, rhonchi or rales.  Abdominal:     Palpations: Abdomen is soft.     Tenderness: There is no abdominal tenderness.  Musculoskeletal:     Right lower leg: No swelling.     Left lower leg: No swelling.  Skin:    General: Skin is warm.     Findings: No rash.  Neurological:     Mental Status: He is alert.     Comments: Answer some yes/no questions appropriately.  Able to straight leg raise.      Scheduled Meds:  aspirin EC  81 mg Oral Daily   atorvastatin  80 mg Oral Daily   clopidogrel  75 mg Oral Daily   enalapril  10  mg Oral Daily   enoxaparin (LOVENOX) injection  40 mg Subcutaneous Q24H   insulin aspart  0-9 Units Subcutaneous TID WC   insulin aspart protamine- aspart  15 Units Subcutaneous Q breakfast   insulin aspart protamine- aspart  20 Units Subcutaneous Q supper    Brief history.  Past medical history of CAD, CVA, aortic stenosis, hyperlipidemia and hypertension.  Patient admitted 19 days ago with dizziness and gait disturbance and found to have small acute infarcts in the left medullary pyramid and superior right frontal lobe.  No acute hemorrhage or mass-effect.  Patient did have chronic microhemorrhages predominantly central.  Patient was seen by neurology and recommended aspirin and Plavix for 21 days and then aspirin alone after that.  Patient has a Pharmacist, community on.  Patient also had incidental COVID-19 infection and was in isolation for 10 days.  Weight awaiting rehab and insurance authorization.  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  Neurology recommended aspirin and Plavix for 21 days and aspirin after that.  Zio patch applied and if atrial fibrillation neurology okayed to go on blood thinner.  Echocardiogram shows moderate to severe aortic stenosis.  Patient is on or atorvastatin.  LDL 103.  As per transitional care team, rehab facility submitting insurance authorization again today. Cognitive impairment.  Patient with poor safety awareness, impaired motor control and planning.  Also with balance issues.  Physical therapy recommending rehab secondary to unsafe discharge home with patient living by himself. Incidental COVID-19 infection.  Out of isolation. Acute on chronic dizziness secondary to strokes and orthostatic hypotension with severe aortic stenosis.  Patient on enalapril. Type 2 diabetes mellitus with hyperlipidemia.  Continue atorvastatin.  Patient on 70/30 insulin: 15 units in the morning and 20 units in the  evening History of CAD on aspirin and Plavix Chronic diastolic congestive heart failure.  No signs of heart failure during this hospital course.        Code Status:     Code Status Orders  (From admission, onward)           Start     Ordered   04/17/21 2044  Full code  Continuous        04/17/21 2046           Code Status History     Date Active Date Inactive Code Status Order ID Comments User Context   10/18/2019 2341 10/20/2019 2012 Full Code [id number]  Sidney Ace Arvella Merles, MD ED   09/01/2018 1947 09/03/2018 1808 Full Code [id number]  Saundra Shelling, MD ED      Advance Directive Documentation    [place] Most Recent Value  Type of Advance Directive Living will  Pre-existing out of facility DNR order (yellow form or pink MOST form) --  "MOST" Form in Place? --       Disposition Plan: Status is: Inpatient  Dispo: The patient is from: Home              Anticipated d/c is to: Rehab              Patient currently medically stable to go out to rehab at any time.  As per transitional care team awaiting insurance authorization.  Shortness of authorization reapplied for today.   Difficult to place patient.  No.  Time spent: 26 minutes  [place]

## 2021-05-07 NOTE — Progress Notes (Signed)
OT Cancellation Note  Patient Details Name: Victor Fox MRN: [id number] DOB: [birth year]   Cancelled Treatment:    Reason Eval/Treat Not Completed: Fatigue/lethargy limiting ability to participate. Multiple attempts made to wake pt for OT intervention but pt continues to sleep soundly. RN notified. OT will re-attempt when pt is more alert and able to actively participate.   Darleen Crocker, MS, OTR/L , CBIS ascom [phone]  05/07/21, 3:15 PM

## 2021-05-07 NOTE — TOC Progression Note (Signed)
Transition of Care [facility]) - Progression Note    Patient Details  Name: Victor Fox MRN: [id number] Date of Birth: 08-05-1946  Transition of Care [facility]) CM/SW [place], RN Phone Number: 05/07/2021, 10:17 AM  Clinical Narrative:     Shirlee Limerick at [facility] will call with auth [phone]       Expected Discharge Plan and Services           Expected Discharge Date: 05/03/21                                     Social Determinants of Health (SDOH) Interventions    Readmission Risk Interventions No flowsheet data found.

## 2021-05-07 NOTE — Progress Notes (Signed)
Physical Therapy Treatment Patient Details Name: Victor Fox MRN: [id number] DOB: 03-Sep-1945 Today's Date: 05/07/2021    History of Present Illness 75 y.o. male with a past medical history of significant diabetes mellitus type 2, hypertension, hyperlipidemia, neuropathy, vertigo, carotid artery disease, coronary artery disease, history of CVA, former smoker, resented to the hospital with c/o unsteainess/falls starting 8/13, MRI shows ischemic infarct.    PT Comments    Pt was just returning to bed with RN after getting up to BR. He is alert and oriented x 3 however pt has extremely poor safety awareness with poor insight of deficits. He does know he has had a stroke however poor insight of the safety concerns he presents with during ADLs. Pt is impulsive at times. Author discussed needing to slow down with all mobility, transfers, and gait for improved safety. Per pt," I did not use RW prior to stroke." Author had pt use SPC to stand and ambulate however due to severely impaired balance, pt required use of SPC + authors HHA to prevent LOB. Mod assist at times to prevent LOB/fall. Overall extremely poor gait kinematics without use of RW. Pt will greatly benefit from SNF at DC to address deficits while maximizing independence with ADLs.    Follow Up Recommendations  SNF;Supervision/Assistance - 24 hour;Supervision for mobility/OOB     Equipment Recommendations  Other (comment) (defer to next level of care)       Precautions / Restrictions Precautions Precautions: Fall Restrictions Weight Bearing Restrictions: No    Mobility  Bed Mobility Overal bed mobility: Needs Assistance Bed Mobility: Supine to Sit     Supine to sit: HOB elevated (heavy use of bedrail)     General bed mobility comments: Pt was supine in bed (laying at the foot of bed) upon arriving. He was able to acheive EOB short sit with heavy use of bedrail. Vcs for improved technique and to slow down. pt is impulsive.     Transfers Overall transfer level: Needs assistance Equipment used: Rolling walker (2 wheeled) Transfers: Sit to/from Stand Sit to Stand: Min assist         General transfer comment: pt required min assist to perform STS 3 x throughout session. vcs for handplacement, fwd wt shift, and overall slowing down for improved safety.  Ambulation/Gait Ambulation/Gait assistance: Min assist;Mod assist Gait Distance (Feet): 40 Feet Assistive device: Straight cane;1 person hand held assist Gait Pattern/deviations: Decreased stride length;Trunk flexed;Decreased weight shift to left;Decreased step length - right Gait velocity: decreased   General Gait Details: per pt, " I just used furniture to get around my trailer prior to stroke." Pt was able to ambulated with [facility] + mod assistance. poor gait kinematics throughout and overall poor safety awareness. Author highly recommend use of RW at all times in standing.     Balance Overall balance assessment: Needs assistance Sitting-balance support: No upper extremity supported;Feet supported Sitting balance-Leahy Scale: Good     Standing balance support: During functional activity;Single extremity supported Standing balance-Leahy Scale: Poor Standing balance comment: extremely high fall risk without use of RW/BUE support. mod assist thgroughout with use of SPC      Cognition Arousal/Alertness: Awake/alert Behavior During Therapy: WFL for tasks assessed/performed Overall Cognitive Status: No family/caregiver present to determine baseline cognitive functioning Area of Impairment: Safety/judgement      Orientation Level: Disoriented to;Situation;Time       Safety/Judgement: Decreased awareness of safety;Decreased awareness of deficits     General Comments: Pt is alert however disoriented  to overall situation + poor insight of safety concerns/abilities. pt was just returning to bed from getting up to BR with RN             Pertinent  Vitals/Pain Pain Assessment: No/denies pain     PT Goals (current goals can now be found in the care plan section) Acute Rehab PT Goals Patient Stated Goal: get stronger Progress towards PT goals: Progressing toward goals    Frequency    Min 2X/week      PT Plan Current plan remains appropriate       AM-PAC PT "6 Clicks" Mobility   Outcome Measure  Help needed turning from your back to your side while in a flat bed without using bedrails?: A Little Help needed moving from lying on your back to sitting on the side of a flat bed without using bedrails?: A Little Help needed moving to and from a bed to a chair (including a wheelchair)?: A Lot Help needed standing up from a chair using your arms (e.g., wheelchair or bedside chair)?: A Lot Help needed to walk in hospital room?: A Lot Help needed climbing 3-5 steps with a railing? : A Lot 6 Click Score: 14    End of Session Equipment Utilized During Treatment: Gait belt Activity Tolerance: Patient tolerated treatment well Patient left: in chair;with chair alarm set;Other (comment) (posey belt alarm) Nurse Communication: Mobility status PT Visit Diagnosis: Muscle weakness (generalized) (M62.81);Difficulty in walking, not elsewhere classified (R26.2);Unsteadiness on feet (R26.81)     Time: 1013-1030 PT Time Calculation (min) (ACUTE ONLY): 17 min  Charges:  $Gait Training: 8-22 mins                     Julaine Fusi PTA 05/07/21, 10:47 AM

## 2021-05-07 NOTE — TOC Progression Note (Signed)
Transition of Care [facility]) - Progression Note    Patient Details  Name: Victor Fox MRN: [id number] Date of Birth: 12/22/1945  Transition of Care [facility]) CM/SW [place], RN Phone Number: 05/07/2021, 3:54 PM  Clinical Narrative:   Shirlee Limerick from [facility] will restart auth, apparently the authorization she started last week did not go through and caseworker called and asked her to restart.  TOC will follow up with Shirlee Limerick tomorrow.         Expected Discharge Plan and Services           Expected Discharge Date: 05/03/21                                     Social Determinants of Health (SDOH) Interventions    Readmission Risk Interventions No flowsheet data found.

## 2021-05-08 LAB — GLUCOSE, CAPILLARY
Glucose-Capillary: 216 mg/dL — ABNORMAL HIGH (ref 70–99)
Glucose-Capillary: 190 mg/dL — ABNORMAL HIGH (ref 70–99)
Glucose-Capillary: 120 mg/dL — ABNORMAL HIGH (ref 70–99)

## 2021-05-08 MED ORDER — SODIUM CHLORIDE 0.9 % IV SOLN
12.5000 mg | Freq: Four times a day (QID) | INTRAVENOUS | Status: DC | PRN
  Filled 2021-05-08 (×3): qty 0.5

## 2021-05-08 MED ORDER — SCOPOLAMINE 1 MG/3DAYS TD PT72
1.0000 | MEDICATED_PATCH | TRANSDERMAL | Status: DC
  Administered 2021-05-08: 1.5 mg via TRANSDERMAL
  Filled 2021-05-08: qty 1

## 2021-05-08 NOTE — Progress Notes (Signed)
Occupational Therapy Treatment Patient Details Name: Victor Fox MRN: [id number] DOB: Jan 04, 1946 Today's Date: 05/08/2021    History of present illness 75 y.o. male with a past medical history of significant diabetes mellitus type 2, hypertension, hyperlipidemia, neuropathy, vertigo, carotid artery disease, coronary artery disease, history of CVA, former smoker, resented to the hospital with c/o unsteainess/falls starting 8/13, MRI shows ischemic infarct.   OT comments  Upon entering the room, pt attempting to scoot to EOB to squeeze self between rail and EOB. Bed alarm going off.Pt reports need to use urinal. He is able to place urinal himself and sits EOB to utilize with supervision. Pt continues to report nausea and does not feel well. Min A to reposition in bed for comfort. He asks for ginger ale and when brought to room to thicken he reports, " If you put that stuff in it I don't want it". OT explained to pt he is unable to have regular drinks secondary to aspiration. Pt continues to refuse so drink disposed of and not given to pt for safety. Pt returning to supine to rest. RN notified of pt refusing nectar thick liquids this session.    Follow Up Recommendations  SNF    Equipment Recommendations  3 in 1 bedside commode       Precautions / Restrictions Precautions Precautions: Fall       Mobility Bed Mobility Overal bed mobility: Needs Assistance Bed Mobility: Sit to Supine       Sit to supine: Min assist   General bed mobility comments: Pt needing min A to reposition self in bed.              Balance Overall balance assessment: Needs assistance Sitting-balance support: No upper extremity supported;Feet supported Sitting balance-Leahy Scale: Good                                     ADL either performed or assessed with clinical judgement     Vision Patient Visual Report: No change from baseline            Cognition Arousal/Alertness:  Awake/alert Behavior During Therapy: Restless;Impulsive Overall Cognitive Status: No family/caregiver present to determine baseline cognitive functioning Area of Impairment: Safety/judgement;Awareness                 Orientation Level: Disoriented to;Situation       Safety/Judgement: Decreased awareness of safety;Decreased awareness of deficits     General Comments: Pt not feeling well and more impulsive during session.                   Pertinent Vitals/ Pain       Pain Assessment: No/denies pain   Frequency  Min 2X/week        Progress Toward Goals  OT Goals(current goals can now be found in the care plan section)  Progress towards OT goals: Progressing toward goals  Acute Rehab OT Goals Patient Stated Goal: none stated OT Goal Formulation: With patient Time For Goal Achievement: 05/15/21 Potential to Achieve Goals: Good  Plan Discharge plan remains appropriate;Frequency needs to be updated       AM-PAC OT "6 Clicks" Daily Activity     Outcome Measure   Help from another person eating meals?: None Help from another person taking care of personal grooming?: A Little Help from another person toileting, which includes using toliet, bedpan, or urinal?: A Lot Help  from another person bathing (including washing, rinsing, drying)?: A Little Help from another person to put on and taking off regular upper body clothing?: None Help from another person to put on and taking off regular lower body clothing?: A Little 6 Click Score: 19    End of Session    OT Visit Diagnosis: Other abnormalities of gait and mobility (R26.89);Muscle weakness (generalized) (M62.81)   Activity Tolerance Other (comment) (Pt does not feel well and is nauseated)   Patient Left with call bell/phone within reach;in chair;with chair alarm set   Nurse Communication Mobility status        Time: 1435-1445 OT Time Calculation (min): 10 min  Charges: OT General Charges $OT Visit:  1 Visit OT Treatments $Self Care/Home Management : 8-22 mins  Darleen Crocker, MS, OTR/L , CBIS ascom [phone]  05/08/21, 3:07 PM

## 2021-05-08 NOTE — Progress Notes (Signed)
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  Neurology recommended aspirin and Plavix for 21 days and aspirin after that.  Zio patch applied and if atrial fibrillation neurology okayed to go on blood thinner. Echo shows moderate to severe aortic stenosis  Cognitive impairment: w/ poor safety awareness, impaired motor control.  Also with balance issues. PT/OT recs SNF. Waiting on SNF placement  COVID-19 infection: completed isolation. No treatment needed currently   Acute on chronic dizziness: secondary to CVAs and orthostatic hypotension with severe aortic stenosis. Meclizine prn  DM2: likely poorly controlled. Continue on 70/30 & SSI w/ accuchecks.    HLD: continue on statin   Hx of CAD: continue on home dose of plavix, aspirin   Chronic diastolic CHF: appears euvolemic. Monitor I/Os    DVT prophylaxis:lovenox  Code Status: full  Family Communication: Disposition Plan: waiting on SNF placement   Level of care: Med-Surg  Status is: Inpatient  Remains inpatient appropriate because:Unsafe d/c plan  Dispo: The patient is from: Home              Anticipated d/c is to: SNF              Patient currently is medically stable to d/c.   Difficult to place patient : unclear    Consultants:    Procedures:   Antimicrobials:    Subjective: Pt c/o fatigue  Objective: Vitals:   05/07/21 1108 05/07/21 1520 05/07/21 2219 05/08/21 0736  BP:  139/65 (!) 157/58 (!) 130/58 (!) 158/71  Pulse: 63 60 63 (!) 56  Resp: '17 16 16 15  '$ Temp: 98.3 F (36.8 C) 97.9 F (36.6 C) 98.8 F (37.1 C) 97.6 F (36.4 C)  TempSrc:      SpO2: 97% 98% 99% 96%  Weight:      Height:        Intake/Output Summary (Last 24 hours) at 05/08/2021 0825 Last data filed at 05/08/2021 0733 Gross per 24 hour  Intake 240 ml  Output 690 ml  Net -450 ml   Filed Weights   04/17/21 1639 04/17/21 1643 05/01/21 1415  Weight: 90.7 kg 90.7 kg 89.9 kg    Examination:  General exam: Appears calm and comfortable  Respiratory system: Clear to auscultation. Respiratory effort normal. Cardiovascular system: S1 & S2 +. No rubs, gallops or clicks.  Gastrointestinal system: Abdomen is nondistended, soft and nontender.  Normal bowel sounds heard. Central nervous system: Alert and oriented. Moves all extremities  Psychiatry: Judgement and insight appear poor. Flat mood and affect     Data Reviewed: I have personally reviewed following labs and imaging studies  CBC: No results for input(s): WBC, NEUTROABS, HGB, HCT, MCV, PLT in the last 168 hours. Basic Metabolic Panel: No results for input(s): NA, K, CL, CO2, GLUCOSE, BUN, CREATININE, CALCIUM, MG, PHOS in the last 168 hours. GFR: Estimated Creatinine Clearance: 79 mL/min (by C-G formula based on SCr of 0.9 mg/dL). Liver Function Tests: No  results for input(s): AST, ALT, ALKPHOS, BILITOT, PROT, ALBUMIN in the last 168 hours. No results for input(s): LIPASE, AMYLASE in the last 168 hours. No results for input(s): AMMONIA in the last 168 hours. Coagulation Profile: No results for input(s): INR, PROTIME in the last 168 hours. Cardiac Enzymes: No results for input(s): CKTOTAL, CKMB, CKMBINDEX, TROPONINI in the last 168 hours. BNP (last 3 results) No results for input(s): PROBNP in the last 8760 hours. HbA1C: No results for input(s): HGBA1C in the last 72 hours. CBG: Recent Labs  Lab 05/06/21 1213 05/06/21 1748  05/06/21 2027 05/07/21 0840 05/07/21 1206  GLUCAP 240* 291* 265* 176* 217*   Lipid Profile: No results for input(s): CHOL, HDL, LDLCALC, TRIG, CHOLHDL, LDLDIRECT in the last 72 hours. Thyroid Function Tests: No results for input(s): TSH, T4TOTAL, FREET4, T3FREE, THYROIDAB in the last 72 hours. Anemia Panel: No results for input(s): VITAMINB12, FOLATE, FERRITIN, TIBC, IRON, RETICCTPCT in the last 72 hours. Sepsis Labs: No results for input(s): PROCALCITON, LATICACIDVEN in the last 168 hours.  No results found for this or any previous visit (from the past 240 hour(s)).       Radiology Studies: No results found.      Scheduled Meds:  aspirin EC  81 mg Oral Daily   atorvastatin  80 mg Oral Daily   clopidogrel  75 mg Oral Daily   enalapril  10 mg Oral Daily   enoxaparin (LOVENOX) injection  40 mg Subcutaneous Q24H   insulin aspart  0-9 Units Subcutaneous TID WC   insulin aspart protamine- aspart  15 Units Subcutaneous Q breakfast   insulin aspart protamine- aspart  20 Units Subcutaneous Q supper   Continuous Infusions:   LOS: 20 days    Time spent: 30 mins     Wyvonnia Dusky, MD [facility] Pager [phone]  If 7PM-7AM, please contact night-coverage 05/08/2021, 8:25 AM

## 2021-05-08 NOTE — Progress Notes (Signed)
Patient with n/v this morning and c/o nausea throughout the day.  PRN and scheduled antinausea medications given as ordered.  Patient with evening CBG 135 with regular and 70/30 insulin ordered for coverage.  Patient is not eating at this time and taking in very little fluids r/t nausea.  MD notified and states to hold both insulins if patient is not taking PO currently.  Will continue to monitor.

## 2021-05-08 NOTE — Progress Notes (Signed)
Physical Therapy Treatment Patient Details Name: Victor Fox MRN: [id number] DOB: 1946-05-27 Today's Date: 05/08/2021    History of Present Illness 75 y.o. male with a past medical history of significant diabetes mellitus type 2, hypertension, hyperlipidemia, neuropathy, vertigo, carotid artery disease, coronary artery disease, history of CVA, former smoker, resented to the hospital with c/o unsteainess/falls starting 8/13, MRI shows ischemic infarct.    PT Comments    Pt was in bed upon arriving. He is A and O and cooperative throughout. Doe shave poor insight of overall deficits. Overall is motivated to get better so he can return home after rehab. He continues to present with residual deficits from resent stroke. He ambulates much safer with RW then with SPC. Author continues to recommend DC to rehab to continue to improve his safety with ADLs.    Follow Up Recommendations  SNF;Supervision/Assistance - 24 hour;Supervision for mobility/OOB     Equipment Recommendations  Other (comment) (defer to next level of care)       Precautions / Restrictions Precautions Precautions: Fall Restrictions Weight Bearing Restrictions: No    Mobility  Bed Mobility Overal bed mobility: Needs Assistance Bed Mobility: Supine to Sit     Supine to sit: Min assist [facility] elevated/ heavy use of bed rails)     General bed mobility comments: Pt was able to progress from supine to short sit with min assist. He has good strength however is impulsive at times.    Transfers Overall transfer level: Needs assistance Equipment used: Rolling walker (2 wheeled) Transfers: Sit to/from Stand Sit to Stand: Min assist         General transfer comment: Min asisst to stand form lowest bed height. Vcs for improved technique and handplacement. poor fwd wt shift  Ambulation/Gait Ambulation/Gait assistance: Min Web designer (Feet): 75 Feet Assistive device: 4-wheeled walker Gait Pattern/deviations:  Decreased stride length;Trunk flexed;Decreased weight shift to left;Decreased step length - right Gait velocity: decreased   General Gait Details: Pt ambulated with RW today with focus on improving heel strike and overall improving gait kinematics. Pt tends to have flexed posture and LLE flat foot step pattern. LLE knee in extension for prolonged period during gait phases.    Balance Overall balance assessment: Needs assistance Sitting-balance support: No upper extremity supported;Feet supported Sitting balance-Leahy Scale: Good     Standing balance support: During functional activity;Bilateral upper extremity supported Standing balance-Leahy Scale: Poor Standing balance comment: extremely high fall risk without use of RW/BUE support. mod assist thgroughout with use of SPC      Cognition Arousal/Alertness: Awake/alert Behavior During Therapy: WFL for tasks assessed/performed Overall Cognitive Status: No family/caregiver present to determine baseline cognitive functioning Area of Impairment: Safety/judgement                 Orientation Level: Disoriented to;Situation       Safety/Judgement: Decreased awareness of safety;Decreased awareness of deficits     General Comments: Pt is alert however disoriented to overall situation + poor insight of safety concerns/abilities. He is extremely pleasnat and cooperative             Pertinent Vitals/Pain Pain Assessment: No/denies pain     PT Goals (current goals can now be found in the care plan section) Acute Rehab PT Goals Patient Stated Goal: none stated Progress towards PT goals: Progressing toward goals    Frequency    Min 2X/week      PT Plan Current plan remains appropriate  AM-PAC PT "6 Clicks" Mobility   Outcome Measure  Help needed turning from your back to your side while in a flat bed without using bedrails?: A Little Help needed moving from lying on your back to sitting on the side of a flat bed  without using bedrails?: A Little Help needed moving to and from a bed to a chair (including a wheelchair)?: A Little Help needed standing up from a chair using your arms (e.g., wheelchair or bedside chair)?: A Lot Help needed to walk in hospital room?: A Lot Help needed climbing 3-5 steps with a railing? : A Lot 6 Click Score: 15    End of Session Equipment Utilized During Treatment: Gait belt Activity Tolerance: Patient tolerated treatment well Patient left: in chair;with chair alarm set;Other (comment) Nurse Communication: Mobility status PT Visit Diagnosis: Muscle weakness (generalized) (M62.81);Difficulty in walking, not elsewhere classified (R26.2);Unsteadiness on feet (R26.81)     Time: 0940-1003 PT Time Calculation (min) (ACUTE ONLY): 23 min  Charges:  $Gait Training: 8-22 mins $Neuromuscular Re-education: 8-22 mins                     Julaine Fusi PTA 05/08/21, 10:23 AM

## 2021-05-09 LAB — CBC
HCT: 38 % — ABNORMAL LOW (ref 39.0–52.0)
Hemoglobin: 13.3 g/dL (ref 13.0–17.0)
MCH: 29.5 pg (ref 26.0–34.0)
MCHC: 35 g/dL (ref 30.0–36.0)
MCV: 84.3 fL (ref 80.0–100.0)
Platelets: 175 10*3/uL (ref 150–400)
RBC: 4.51 MIL/uL (ref 4.22–5.81)
RDW: 13.4 % (ref 11.5–15.5)
WBC: 8.1 10*3/uL (ref 4.0–10.5)
nRBC: 0 % (ref 0.0–0.2)

## 2021-05-09 LAB — BASIC METABOLIC PANEL
Anion gap: 7 (ref 5–15)
BUN: 11 mg/dL (ref 8–23)
CO2: 32 mmol/L (ref 22–32)
Calcium: 8.6 mg/dL — ABNORMAL LOW (ref 8.9–10.3)
Chloride: 101 mmol/L (ref 98–111)
Creatinine, Ser: 0.79 mg/dL (ref 0.61–1.24)
GFR, Estimated: >60 mL/min (ref >60)
Glucose, Bld: 124 mg/dL — ABNORMAL HIGH (ref 70–99)
Potassium: 4 mmol/L (ref 3.5–5.1)
Sodium: 140 mmol/L (ref 135–145)

## 2021-05-09 LAB — GLUCOSE, CAPILLARY
Glucose-Capillary: 135 mg/dL — ABNORMAL HIGH (ref 70–99)
Glucose-Capillary: 108 mg/dL — ABNORMAL HIGH (ref 70–99)
Glucose-Capillary: 119 mg/dL — ABNORMAL HIGH (ref 70–99)
Glucose-Capillary: 133 mg/dL — ABNORMAL HIGH (ref 70–99)

## 2021-05-09 MED ORDER — ENALAPRIL MALEATE 10 MG PO TABS: 10.0000 mg | ORAL_TABLET | Freq: Every day | ORAL | 0 refills | Status: AC

## 2021-05-09 NOTE — Discharge Planning (Signed)
Attempted to call report x3 times to [facility].  Continued to get no answer and then answering machine would answer.  Left message of attempted report and return # to call for verbal report as needed.

## 2021-05-09 NOTE — Care Management Important Message (Signed)
Important Message  Patient Details  Name: Victor Fox MRN: [id number] Date of Birth: February 14, 1946   Medicare Important Message Given:  Yes  Late entry: Reviewed with patient on 05/08/21 afternoon and obtained signature on concurrent Important Message from Medicare. Will send to HIM to be scanned into patient's medical record.  Juliann Pulse A Dodger Sinning 05/09/2021, 10:40 AM

## 2021-05-09 NOTE — TOC Progression Note (Addendum)
Transition of Care [facility]) - Progression Note    Patient Details  Name: Victor Fox MRN: [id number] Date of Birth: 05-18-1946  Transition of [facility]) CM/SW [place], RN Phone Number: 05/09/2021, 9:14 AM  Clinical Narrative: [id number]  As per Shirlee Limerick, patient can transfer today to [facility], Patient will be transported by [facility] at 55 pm.Patient and DSS representative and care team aware.          Expected Discharge Plan and Services           Expected Discharge Date: 05/03/21                                     Social Determinants of Health (SDOH) Interventions    Readmission Risk Interventions No flowsheet data found.

## 2021-05-09 NOTE — Progress Notes (Signed)
PT Cancellation Note  Patient Details Name: Victor Fox MRN: [id number] DOB: [birth year]   Cancelled Treatment:     PT attempt. Pt sitting EOB actively vomiting with RN tech assisting. Will hold PT and return at a more appropriate time.    Willette Pa 05/09/2021, 8:47 AM

## 2021-05-09 NOTE — Discharge Summary (Signed)
Physician Discharge Summary  Victor Fox MEB:[id number] DOB: June 15, 1946 DOA: 04/17/2021  PCP: Patient, No Pcp Per (Inactive)  Admit date: 04/17/2021 Discharge date: 05/09/2021  Admitted From: home  Disposition:  SNF  Recommendations for Outpatient Follow-up:  Follow up with PCP in 1-2 weeks F/u w/ neuro in 1 week   Home Health: no  Equipment/Devices:  Discharge Condition: stable  CODE STATUS: full  Diet recommendation: dysphagia 3 diet, nectar thick liquids, carb modified   Brief/Interim Summary: HPI was taken from Dr. Rowe Pavy: Victor Fox is a 75 y.o. male with a past medical history of significant diabetes mellitus type 2, hypertension, hyperlipidemia, neuropathy, vertigo, carotid artery disease, coronary artery disease, history of CVA, former smoker.   This patient presents to the emergency department due to dizziness upon standing.  States since Saturday he cannot walk.  He started falling all the time.  In the emergency department today attempted to walk him but he is leaning towards the left side. Pharmacy notes that he has been having difficulty administering his insulin as he cannot see him units to give himself due to his eyesight.   ED Course: MRI brain without contrast, CT head without contrast, EKG, CBC, UA, BMP.  Labetalol 10 mg IV x1.  As per Dr. Leslye Peer: Past medical history of CAD, CVA, aortic stenosis, hyperlipidemia and hypertension.  Patient admitted 19 days ago with dizziness and gait disturbance and found to have small acute infarcts in the left medullary pyramid and superior right frontal lobe.  No acute hemorrhage or mass-effect.  Patient did have chronic microhemorrhages predominantly central.  Patient was seen by neurology and recommended aspirin and Plavix for 21 days and then aspirin alone after that.  Patient has a Pharmacist, community on.  Patient also had incidental COVID-19 infection and was in isolation for 10 days.  Weight awaiting rehab and insurance  authorization.  As per Dr. Jimmye Norman 9/7-05/09/21: CM was finally able to get insurance auth and pt was d/c to SNF. For more information, please see previous progress/consult notes.     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  Neurology recommended aspirin and Plavix for 21 days and aspirin after that.  Zio patch applied and if atrial fibrillation neurology okayed to go on blood thinner. Echo shows moderate to severe aortic stenosis  Cognitive impairment: w/ poor safety awareness, impaired motor control.  Also with balance issues. PT/OT recs SNF.   COVID-19 infection: completed isolation. No treatment needed currently   Acute on chronic dizziness: secondary to CVAs and orthostatic hypotension with severe aortic stenosis. Meclizine prn  DM2: likely poorly controlled. Continue on 70/30 & SSI w/ accuchecks.    HLD: continue on statin   Hx of CAD: continue on home dose of plavix, aspirin   Chronic diastolic CHF: appears euvolemic. Monitor I/Os   Discharge Instructions  Discharge Instructions     Ambulatory referral to Neurology   Complete by: As directed    An appointment is requested in approximately: 4-6   Diet - low sodium heart healthy   Complete by: As directed    Dysphagia  3 diet, nectar thick fluids, carb modified. Please add extra gravy on WELL-CUT meats, potatoes.  Likes meatloaf and pintos w/ mashed potatoes. NO STRAWS.   Discharge instructions   Complete by: As directed    F/u w/ PCP in 1-2 weeks. F/u w/ neuro in 1 week   Increase activity slowly    Complete by: As directed       Allergies as of 05/09/2021   No Known Allergies      Medication List     TAKE these medications    amLODipine 10 MG tablet Commonly known as: NORVASC Take 1 tablet (10 mg total) by mouth daily. Appointment needed please for further refills   aspirin EC 81 MG tablet Take 81 mg by mouth daily.   atorvastatin 40 MG tablet Commonly known as: LIPITOR Take 1 tablet (40 mg total) by mouth at bedtime.   B-D UF III MINI PEN NEEDLES 31G X 5 MM Misc Generic drug: Insulin Pen Needle use once daily   blood glucose meter kit and supplies Kit Dispense based on patient and insurance preference. Use up to four times daily as directed. (FOR ICD-9 250.00, 250.01).   enalapril 10 MG tablet Commonly known as: VASOTEC Take 1 tablet (10 mg total) by mouth daily. Start taking on: May 10, 2021 What changed:  medication strength how much to take   meclizine 25 MG tablet Commonly known as: ANTIVERT Take 1 tablet (25 mg total) by mouth 3 (three) times daily as needed for dizziness.   NovoLIN 70/30 Kwikpen (70-30) 100 UNIT/ML KwikPen Generic drug: insulin isophane & regular human KwikPen Inject 15 Units into the skin in the morning and at bedtime.   ONE TOUCH ULTRA TEST test strip Generic drug: glucose blood USE QID UTD   OneTouch Delica Plus [id number] Misc USE QID UTD        Contact information for follow-up providers     Minna Merritts, MD Follow up in 2 week(s).   Specialty: Cardiology Contact information: [place] [phone] [phone]              Contact information for after-discharge care     Destination     [facility] .   Service: Skilled Nursing Contact information: [address] [place] [phone]                    No Known Allergies  Consultations: Neuro  Cardio    Procedures/Studies: CT ANGIO HEAD NECK W  WO CM  Result Date: 04/18/2021 CLINICAL DATA:  Small acute strokes on MRI brain EXAM: CT ANGIOGRAPHY HEAD AND NECK TECHNIQUE: Multidetector CT imaging of the head and neck was performed using the standard protocol during bolus administration of intravenous contrast. Multiplanar CT image reconstructions and MIPs were obtained to evaluate the vascular anatomy. Carotid stenosis measurements (when applicable) are obtained utilizing NASCET criteria, using the distal internal carotid diameter as the denominator. CONTRAST:  12m OMNIPAQUE IOHEXOL 350 MG/ML SOLN COMPARISON:  Recent CT and MR imaging FINDINGS: CT HEAD Brain: Small infarcts on the MRI brain are not well seen. There is no acute intracranial hemorrhage. No new loss of gray-white differentiation. Stable findings of chronic microvascular ischemic changes and parenchymal volume loss. Few chronic small vessel infarcts including involvement of the right cerebellum and left thalamus. Ventricles and sulci are stable in size and configuration. Vascular: Better evaluated on CTA portion. Skull: Calvarium is unremarkable. Sinuses/Orbits:  No acute finding. Other: None. Review of the MIP images confirms the above findings CTA NECK Aortic arch: Calcified plaque along the thoracic aorta. Great vessel origins are patent. Right carotid system: Patent. Calcified plaque along the distal common carotid with minimal stenosis. Calcified plaque at the bifurcation and along the proximal internal carotid. There is less than 50% stenosis. Left carotid system: Patent. Mixed plaque along the common carotid with minimal stenosis. Calcified plaque along the proximal internal carotid with less than 50% stenosis. Vertebral arteries: Patent. Right vertebral artery is slightly dominant. There is focal moderate to marked stenosis of the left vertebral artery at the C4-C5 level. Skeleton: Cervical spine degenerative changes. Prominent osteophyte extends into the right ventral and lateral spinal  canal at C5-C6. Other neck: None. Upper chest: Branching opacity at the left lung apex likely reflects impacted airways. Review of the MIP images confirms the above findings CTA HEAD Anterior circulation: Intracranial internal carotid arteries are patent with calcified plaque causing mild stenosis. Anterior and middle cerebral arteries are patent. Plaque causes mild stenosis of the left M1 MCA. There is mild to moderate stenosis of the distal left A2 ACA. Posterior circulation: Intracranial vertebral arteries are patent with calcified plaque bilaterally causing mild stenosis. Basilar artery is patent. Major cerebellar artery origins are patent. Posterior cerebral arteries are patent. Venous sinuses: Patent as allowed by contrast bolus timing. Review of the MIP images confirms the above findings IMPRESSION: No acute intracranial abnormality. Small infarcts on brain MRI are not seen. No large vessel occlusion. Plaque along the proximal internal carotids causes less than 50% stenosis. Mild intracranial atherosclerosis. Electronically Signed   By: Macy Mis M.D.   On: 04/18/2021 14:42   CT HEAD WO CONTRAST (5MM)  Result Date: 04/17/2021 CLINICAL DATA:  Neuro deficit, acute, stroke suspected. Dizziness. Gait disturbance. EXAM: CT HEAD WITHOUT CONTRAST TECHNIQUE: Contiguous axial images were obtained from the base of the skull through the vertex without intravenous contrast. COMPARISON:  01/08/2020 FINDINGS: Brain: Advanced generalized brain volume loss. Advanced chronic small-vessel ischemic changes affecting the pons, thalami and cerebral hemispheric white matter. No discernible change since last year. No large vessel territory stroke. No mass, hemorrhage, hydrocephalus or extra-axial collection. Vascular: There is atherosclerotic calcification of the major vessels at the base of the brain. Skull: Negative Sinuses/Orbits: Clear/normal Other: None IMPRESSION: No acute finding or change since last year.  Generalized brain volume loss with extensive chronic small-vessel ischemic changes throughout as described. Electronically Signed   By: Nelson Chimes M.D.   On: 04/17/2021 20:10   MR BRAIN WO CONTRAST  Result Date: 04/17/2021 CLINICAL DATA:  Dizziness EXAM: MRI HEAD WITHOUT CONTRAST TECHNIQUE: Multiplanar, multiecho pulse sequences of the brain and surrounding structures were obtained without intravenous contrast. COMPARISON:  10/18/2019 FINDINGS: Brain: Small focus of abnormal diffusion restriction within the left medullary pyramid. There is also a small focus of ischemia in the superior right frontal lobe. Numerous chronic microhemorrhages, predominantly central. Hyperintense T2-weighted signal is moderately widespread throughout the white matter. Advanced atrophy for age. The midline structures are normal. Vascular: Major flow voids are preserved. Skull and upper cervical spine: Normal calvarium and skull base. Visualized upper cervical spine and soft tissues are normal. Sinuses/Orbits:No paranasal sinus fluid levels or advanced mucosal thickening. No mastoid or middle ear effusion. Normal orbits. IMPRESSION: 1. Small acute infarcts of the left medullary pyramid and superior right frontal lobe. No acute hemorrhage or mass effect. 2. Numerous chronic microhemorrhages, predominantly central. This likely indicates chronic hypertensive angiopathy. 3.  Advanced atrophy and chronic small vessel ischemic changes. Electronically Signed   By: Ulyses Jarred M.D.   On: 04/17/2021 22:03   US Carotid Bilateral  Result Date: 04/18/2021 CLINICAL DATA:  Near syncopal event EXAM: BILATERAL CAROTID DUPLEX ULTRASOUND TECHNIQUE: Pearline Cables scale imaging, color Doppler and duplex ultrasound were performed of bilateral carotid and vertebral arteries in the neck. COMPARISON:  10/19/2019 FINDINGS: Criteria: Quantification of carotid stenosis is based on velocity parameters that correlate the residual internal carotid diameter with  NASCET-based stenosis levels, using the diameter of the distal internal carotid lumen as the denominator for stenosis measurement. The following velocity measurements were obtained: RIGHT ICA: 78/10 cm/sec CCA:  49/2 cm/sec SYSTOLIC ICA/CCA RATIO:  1.7 ECA:  63 cm/sec LEFT ICA:  74/9 cm/sec CCA:  01/0 cm/sec SYSTOLIC ICA/CCA RATIO:  1.4 ECA:  66 cm/sec RIGHT CAROTID ARTERY: Preliminary grayscale images demonstrate scattered atherosclerotic plaque in the common and internal carotid arteries. The waveforms, velocities and flow velocity ratios however demonstrate no evidence of focal hemodynamically significant stenosis. RIGHT VERTEBRAL ARTERY:  Antegrade in nature. LEFT CAROTID ARTERY: Preliminary grayscale images of the left carotid system demonstrates scattered atherosclerotic plaque in the common and internal carotid artery. The waveforms, velocities and flow velocity ratios show no evidence of focal hemodynamically significant stenosis. LEFT VERTEBRAL ARTERY:  Antegrade in nature. IMPRESSION: Extensive bilateral atherosclerotic plaque similar to that seen on prior exams without focal hemodynamically significant stenosis. Electronically Signed   By: Inez Catalina M.D.   On: 04/18/2021 01:20   DG Chest Portable 1 View  Result Date: 04/17/2021 CLINICAL DATA:  COVID-19 positivity with dizziness, initial encounter EXAM: PORTABLE CHEST 1 VIEW COMPARISON:  10/08/2018 FINDINGS: Cardiac shadow is within normal limits. Aortic calcifications are seen. Lungs are clear bilaterally. Postsurgical changes in the thoracolumbar spine are noted. IMPRESSION: No active disease. Electronically Signed   By: Inez Catalina M.D.   On: 04/17/2021 21:41   DG Abd Portable 1V  Result Date: 04/19/2021 CLINICAL DATA:  Nausea and vomiting.  COVID-19 viral infection. EXAM: PORTABLE ABDOMEN - 1 VIEW COMPARISON:  None. FINDINGS: The bowel gas pattern is normal. No radio-opaque calculi or other significant radiographic abnormality are seen.  Thoracolumbar spine fusion hardware noted. IMPRESSION: Negative. Electronically Signed   By: Marlaine Hind M.D.   On: 04/19/2021 18:32   ECHOCARDIOGRAM COMPLETE  Result Date: 04/20/2021    ECHOCARDIOGRAM REPORT   Patient Name:   Victor Fox Date of Exam: 04/19/2021 Medical Rec #:  [id number]      Height:       72.0 in Accession #:    [id number]     Weight:       200.0 lb Date of Birth:  July 18, 1946       BSA:          2.130 m Patient Age:    66 years       BP:           143/71 mmHg Patient Gender: M              HR:           70 bpm. Exam Location:  [facility] Procedure: 2D Echo, Cardiac Doppler and Color Doppler Indications:     Murmur R01.1  History:         Patient has no prior history of Echocardiogram examinations and                  Patient has prior history of Echocardiogram examinations.  Risk                  Factors:Diabetes and Hypertension. CVA.  Sonographer:     Alyse Low Roar Referring Phys:  [place] Phys: Ida Rogue MD IMPRESSIONS  1. Left ventricular ejection fraction, by estimation, is 60 to 65%. The left ventricle has normal function. The left ventricle has no regional wall motion abnormalities. There is moderate left ventricular hypertrophy. Left ventricular diastolic parameters are consistent with Grade I diastolic dysfunction (impaired relaxation).  2. Right ventricular systolic function is normal. The right ventricular size is normal.  3. Left atrial size was mildly dilated.  4. The aortic valve is normal in structure. There is severe calcifcation of the aortic valve. Aortic valve regurgitation is mild. Moderate to severe aortic valve stenosis. Aortic valve area, by VTI measures 1.20 cm. Aortic valve mean gradient measures 28.5 mmHg. Aortic valve Vmax measures 3.41 m/s. FINDINGS  Left Ventricle: Left ventricular ejection fraction, by estimation, is 60 to 65%. The left ventricle has normal function. The left ventricle has no regional wall motion abnormalities. The left  ventricular internal cavity size was normal in size. There is  moderate left ventricular hypertrophy. Left ventricular diastolic parameters are consistent with Grade I diastolic dysfunction (impaired relaxation). Right Ventricle: The right ventricular size is normal. No increase in right ventricular wall thickness. Right ventricular systolic function is normal. Left Atrium: Left atrial size was mildly dilated. Right Atrium: Right atrial size was normal in size. Pericardium: There is no evidence of pericardial effusion. Mitral Valve: The mitral valve is normal in structure. No evidence of mitral valve regurgitation. No evidence of mitral valve stenosis. Tricuspid Valve: The tricuspid valve is normal in structure. Tricuspid valve regurgitation is not demonstrated. No evidence of tricuspid stenosis. Aortic Valve: The aortic valve is normal in structure. There is severe calcifcation of the aortic valve. Aortic valve regurgitation is mild. Moderate to severe aortic stenosis is present. Aortic valve mean gradient measures 28.5 mmHg. Aortic valve peak gradient measures 46.5 mmHg. Aortic valve area, by VTI measures 1.20 cm. Pulmonic Valve: The pulmonic valve was not well visualized. Pulmonic valve regurgitation is not visualized. No evidence of pulmonic stenosis. Aorta: The aortic root is normal in size and structure. Venous: The pulmonary veins were not well visualized. The inferior vena cava is normal in size with greater than 50% respiratory variability, suggesting right atrial pressure of 3 mmHg. IAS/Shunts: No atrial level shunt detected by color flow Doppler.  LEFT VENTRICLE PLAX 2D LVIDd:         5.01 cm  Diastology LVIDs:         3.33 cm  LV e' medial:    4.35 cm/s LV PW:         1.23 cm  LV E/e' medial:  14.3 LV IVS:        1.41 cm  LV e' lateral:   4.79 cm/s LVOT diam:     2.20 cm  LV E/e' lateral: 13.0 LV SV:         82 LV SV Index:   39 LVOT Area:     3.80 cm  RIGHT VENTRICLE RV Basal diam:  3.64 cm RV Mid  diam:    3.35 cm RV S prime:     17.20 cm/s TAPSE (M-mode): 2.4 cm LEFT ATRIUM             Index       RIGHT ATRIUM  Index LA diam:        3.80 cm 1.78 cm/m  RA Area:     16.30 cm LA Vol (A2C):   76.1 ml 35.72 ml/m RA Volume:   39.00 ml  18.31 ml/m LA Vol (A4C):   65.7 ml 30.84 ml/m LA Biplane Vol: 72.0 ml 33.80 ml/m  AORTIC VALVE                    PULMONIC VALVE AV Area (Vmax):    1.17 cm     PV Vmax:        1.19 m/s AV Area (Vmean):   1.04 cm     PV Peak grad:   5.7 mmHg AV Area (VTI):     1.20 cm     RVOT Peak grad: 5 mmHg AV Vmax:           341.00 cm/s AV Vmean:          251.000 cm/s AV VTI:            0.687 m AV Peak Grad:      46.5 mmHg AV Mean Grad:      28.5 mmHg LVOT Vmax:         105.00 cm/s LVOT Vmean:        68.700 cm/s LVOT VTI:          0.216 m LVOT/AV VTI ratio: 0.31  AORTA Ao Asc diam: 3.20 cm MITRAL VALVE MV Area (PHT): 2.55 cm     SHUNTS MV Decel Time: 298 msec     Systemic VTI:  0.22 m MV E velocity: 62.40 cm/s   Systemic Diam: 2.20 cm MV A velocity: 120.00 cm/s MV E/A ratio:  0.52 MV A Prime:    7.0 cm/s Ida Rogue MD Electronically signed by Ida Rogue MD Signature Date/Time: 04/20/2021/9:14:23 AM    Final    (Echo, Carotid, EGD, Colonoscopy, ERCP)    Subjective: Pt c/o intermittent dizziness    Discharge Exam: Vitals:   05/09/21 0348 05/09/21 0746  BP: (!) 151/89 (!) 158/61  Pulse: (!) 57 (!) 56  Resp: 18 16  Temp: 97.7 F (36.5 C) 98.8 F (37.1 C)  SpO2: 98% 99%   Vitals:   05/08/21 1940 05/08/21 1942 05/09/21 0348 05/09/21 0746  BP: (!) 157/72 (!) 157/72 (!) 151/89 (!) 158/61  Pulse: (!) 56 (!) 56 (!) 57 (!) 56  Resp: 18 16 18 16   Temp: 97.7 F (36.5 C) 97.7 F (36.5 C) 97.7 F (36.5 C) 98.8 F (37.1 C)  TempSrc:      SpO2: 99% 99% 98% 99%  Weight:      Height:        General: Pt is alert, awake, not in acute distress Cardiovascular: S1/S2 +, no rubs, no gallops Respiratory: CTA bilaterally, no wheezing, no rhonchi Abdominal:  Soft, NT, ND, bowel sounds + Extremities:  no cyanosis    The results of significant diagnostics from this hospitalization (including imaging, microbiology, ancillary and laboratory) are listed below for reference.     Microbiology: No results found for this or any previous visit (from the past 240 hour(s)).   Labs: BNP (last 3 results) Recent Labs    04/20/21 0604 04/21/21 0638 04/22/21 0446  BNP 19.1 45.0 62.2   Basic Metabolic Panel: Recent Labs  Lab 05/09/21 0431  NA 140  K 4.0  CL 101  CO2 32  GLUCOSE 124*  BUN 11  CREATININE 0.79  CALCIUM 8.6*   Liver  Function Tests: No results for input(s): AST, ALT, ALKPHOS, BILITOT, PROT, ALBUMIN in the last 168 hours. No results for input(s): LIPASE, AMYLASE in the last 168 hours. No results for input(s): AMMONIA in the last 168 hours. CBC: Recent Labs  Lab 05/09/21 0431  WBC 8.1  HGB 13.3  HCT 38.0*  MCV 84.3  PLT 175   Cardiac Enzymes: No results for input(s): CKTOTAL, CKMB, CKMBINDEX, TROPONINI in the last 168 hours. BNP: Invalid input(s): POCBNP CBG: Recent Labs  Lab 05/08/21 0838 05/08/21 1241 05/08/21 1706 05/08/21 2135 05/09/21 0846  GLUCAP 190* 120* 135* 108* 119*   D-Dimer No results for input(s): DDIMER in the last 72 hours. Hgb A1c No results for input(s): HGBA1C in the last 72 hours. Lipid Profile No results for input(s): CHOL, HDL, LDLCALC, TRIG, CHOLHDL, LDLDIRECT in the last 72 hours. Thyroid function studies No results for input(s): TSH, T4TOTAL, T3FREE, THYROIDAB in the last 72 hours.  Invalid input(s): FREET3 Anemia work up No results for input(s): VITAMINB12, FOLATE, FERRITIN, TIBC, IRON, RETICCTPCT in the last 72 hours. Urinalysis    Component Value Date/Time   COLORURINE YELLOW (A) 04/17/2021 1930   APPEARANCEUR CLEAR (A) 04/17/2021 1930   APPEARANCEUR Clear 05/02/2013 1443   LABSPEC 1.010 04/17/2021 1930   LABSPEC 1.017 05/02/2013 1443   PHURINE 5.0 04/17/2021 1930    GLUCOSEU >=500 (A) 04/17/2021 1930   GLUCOSEU Negative 05/02/2013 1443   HGBUR NEGATIVE 04/17/2021 1930   BILIRUBINUR NEGATIVE 04/17/2021 1930   BILIRUBINUR Negative 05/02/2013 1443   [place] 04/17/2021 1930   PROTEINUR NEGATIVE 04/17/2021 1930   NITRITE NEGATIVE 04/17/2021 1930   LEUKOCYTESUR NEGATIVE 04/17/2021 1930   LEUKOCYTESUR Negative 05/02/2013 1443   Sepsis Labs Invalid input(s): PROCALCITONIN,  WBC,  LACTICIDVEN Microbiology No results found for this or any previous visit (from the past 240 hour(s)).   Time coordinating discharge: Over 30 minutes  SIGNED:   Wyvonnia Dusky, MD  [facility] 05/09/2021, 10:48 AM Pager   If 7PM-7AM, please contact night-coverage

## 2021-05-12 ENCOUNTER — Other Ambulatory Visit: Payer: Self-pay

## 2021-05-12 ENCOUNTER — Inpatient Hospital Stay (HOSPITAL_COMMUNITY)
Admission: EM | Admit: 2021-05-12 | Discharge: 2021-05-23 | DRG: 064 | Disposition: A | Discharge status: Skilled Nursing Facility | Payer: Medicare Other | Source: Skilled Nursing Facility | Attending: Internal Medicine | Admitting: Internal Medicine

## 2021-05-12 ENCOUNTER — Encounter (HOSPITAL_COMMUNITY): Payer: Self-pay | Admitting: Emergency Medicine

## 2021-05-12 ENCOUNTER — Emergency Department (HOSPITAL_COMMUNITY): Payer: Medicare Other

## 2021-05-12 DIAGNOSIS — E1169 Type 2 diabetes mellitus with other specified complication: Secondary | ICD-10-CM | POA: Diagnosis present

## 2021-05-12 DIAGNOSIS — I2510 Atherosclerotic heart disease of native coronary artery without angina pectoris: Secondary | ICD-10-CM | POA: Diagnosis present

## 2021-05-12 DIAGNOSIS — Z794 Long term (current) use of insulin: Secondary | ICD-10-CM

## 2021-05-12 DIAGNOSIS — Z8616 Personal history of COVID-19: Secondary | ICD-10-CM

## 2021-05-12 DIAGNOSIS — I63532 Cerebral infarction due to unspecified occlusion or stenosis of left posterior cerebral artery: Secondary | ICD-10-CM | POA: Diagnosis present

## 2021-05-12 DIAGNOSIS — R29703 NIHSS score 3: Secondary | ICD-10-CM | POA: Diagnosis present

## 2021-05-12 DIAGNOSIS — E1151 Type 2 diabetes mellitus with diabetic peripheral angiopathy without gangrene: Secondary | ICD-10-CM | POA: Diagnosis present

## 2021-05-12 DIAGNOSIS — R29810 Facial weakness: Secondary | ICD-10-CM | POA: Diagnosis present

## 2021-05-12 DIAGNOSIS — S01111A Laceration without foreign body of right eyelid and periocular area, initial encounter: Secondary | ICD-10-CM | POA: Diagnosis present

## 2021-05-12 DIAGNOSIS — T7611XA Adult physical abuse, suspected, initial encounter: Secondary | ICD-10-CM | POA: Diagnosis present

## 2021-05-12 DIAGNOSIS — E785 Hyperlipidemia, unspecified: Secondary | ICD-10-CM | POA: Diagnosis present

## 2021-05-12 DIAGNOSIS — R0602 Shortness of breath: Secondary | ICD-10-CM

## 2021-05-12 DIAGNOSIS — I6523 Occlusion and stenosis of bilateral carotid arteries: Secondary | ICD-10-CM | POA: Diagnosis present

## 2021-05-12 DIAGNOSIS — K5900 Constipation, unspecified: Secondary | ICD-10-CM | POA: Diagnosis not present

## 2021-05-12 DIAGNOSIS — Z981 Arthrodesis status: Secondary | ICD-10-CM

## 2021-05-12 DIAGNOSIS — D72829 Elevated white blood cell count, unspecified: Secondary | ICD-10-CM | POA: Diagnosis not present

## 2021-05-12 DIAGNOSIS — I352 Nonrheumatic aortic (valve) stenosis with insufficiency: Secondary | ICD-10-CM | POA: Diagnosis present

## 2021-05-12 DIAGNOSIS — S0003XA Contusion of scalp, initial encounter: Secondary | ICD-10-CM | POA: Diagnosis present

## 2021-05-12 DIAGNOSIS — Z79899 Other long term (current) drug therapy: Secondary | ICD-10-CM

## 2021-05-12 DIAGNOSIS — S0181XA Laceration without foreign body of other part of head, initial encounter: Secondary | ICD-10-CM | POA: Diagnosis present

## 2021-05-12 DIAGNOSIS — Z66 Do not resuscitate: Secondary | ICD-10-CM | POA: Diagnosis not present

## 2021-05-12 DIAGNOSIS — I779 Disorder of arteries and arterioles, unspecified: Secondary | ICD-10-CM | POA: Diagnosis present

## 2021-05-12 DIAGNOSIS — Z781 Physical restraint status: Secondary | ICD-10-CM

## 2021-05-12 DIAGNOSIS — Z87891 Personal history of nicotine dependence: Secondary | ICD-10-CM

## 2021-05-12 DIAGNOSIS — I110 Hypertensive heart disease with heart failure: Secondary | ICD-10-CM | POA: Diagnosis present

## 2021-05-12 DIAGNOSIS — I672 Cerebral atherosclerosis: Secondary | ICD-10-CM | POA: Diagnosis present

## 2021-05-12 DIAGNOSIS — I63432 Cerebral infarction due to embolism of left posterior cerebral artery: Secondary | ICD-10-CM | POA: Diagnosis not present

## 2021-05-12 DIAGNOSIS — Z8249 Family history of ischemic heart disease and other diseases of the circulatory system: Secondary | ICD-10-CM

## 2021-05-12 DIAGNOSIS — E859 Amyloidosis, unspecified: Secondary | ICD-10-CM | POA: Diagnosis present

## 2021-05-12 DIAGNOSIS — G9341 Metabolic encephalopathy: Secondary | ICD-10-CM | POA: Diagnosis not present

## 2021-05-12 DIAGNOSIS — R471 Dysarthria and anarthria: Secondary | ICD-10-CM | POA: Diagnosis present

## 2021-05-12 DIAGNOSIS — E1165 Type 2 diabetes mellitus with hyperglycemia: Secondary | ICD-10-CM | POA: Diagnosis present

## 2021-05-12 DIAGNOSIS — I639 Cerebral infarction, unspecified: Secondary | ICD-10-CM | POA: Diagnosis not present

## 2021-05-12 DIAGNOSIS — Z9181 History of falling: Secondary | ICD-10-CM

## 2021-05-12 DIAGNOSIS — R296 Repeated falls: Secondary | ICD-10-CM | POA: Diagnosis present

## 2021-05-12 DIAGNOSIS — Z8673 Personal history of transient ischemic attack (TIA), and cerebral infarction without residual deficits: Secondary | ICD-10-CM

## 2021-05-12 DIAGNOSIS — I6521 Occlusion and stenosis of right carotid artery: Secondary | ICD-10-CM | POA: Diagnosis present

## 2021-05-12 DIAGNOSIS — W1830XA Fall on same level, unspecified, initial encounter: Secondary | ICD-10-CM | POA: Diagnosis present

## 2021-05-12 DIAGNOSIS — Z20822 Contact with and (suspected) exposure to covid-19: Secondary | ICD-10-CM | POA: Diagnosis present

## 2021-05-12 DIAGNOSIS — I10 Essential (primary) hypertension: Secondary | ICD-10-CM

## 2021-05-12 DIAGNOSIS — E1142 Type 2 diabetes mellitus with diabetic polyneuropathy: Secondary | ICD-10-CM | POA: Diagnosis present

## 2021-05-12 DIAGNOSIS — R2689 Other abnormalities of gait and mobility: Secondary | ICD-10-CM | POA: Diagnosis present

## 2021-05-12 DIAGNOSIS — Z7982 Long term (current) use of aspirin: Secondary | ICD-10-CM

## 2021-05-12 DIAGNOSIS — I5032 Chronic diastolic (congestive) heart failure: Secondary | ICD-10-CM | POA: Diagnosis present

## 2021-05-12 DIAGNOSIS — W19XXXA Unspecified fall, initial encounter: Secondary | ICD-10-CM

## 2021-05-12 LAB — CBG MONITORING, ED: Glucose-Capillary: 145 mg/dL — ABNORMAL HIGH (ref 70–99)

## 2021-05-12 IMAGING — CT CT HEAD W/O CM
3 of 4 series · 15 of 47 positions shown, 18 images · non-contrast
Comparison: CT [DATE], MRI [DATE]

CLINICAL DATA: Head trauma fall with laceration

EXAM:
CT HEAD WITHOUT CONTRAST
TECHNIQUE: Contiguous axial images were obtained from the base of the skull
through the vertex without intravenous contrast.

[Series 5: coronal soft tissue · coronal · 0.38mm/px · 3 of 71 slices shown]
[im 24/71  brain]
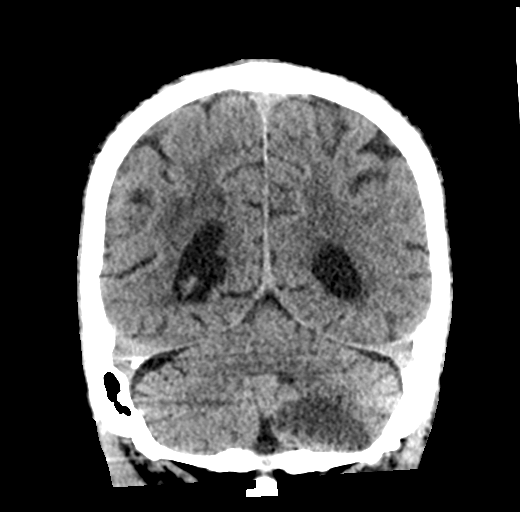
[im 32/71  brain]
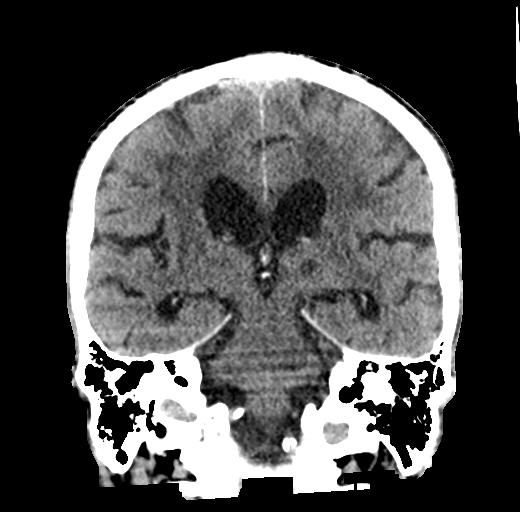
[im 39/71  brain]
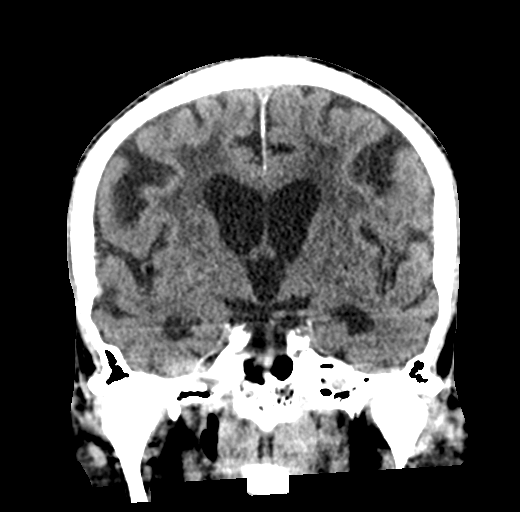

[Series 6: sagittal soft tissue · sagittal · 0.38mm/px · 3 of 61 slices shown]
[im 21/61  brain]
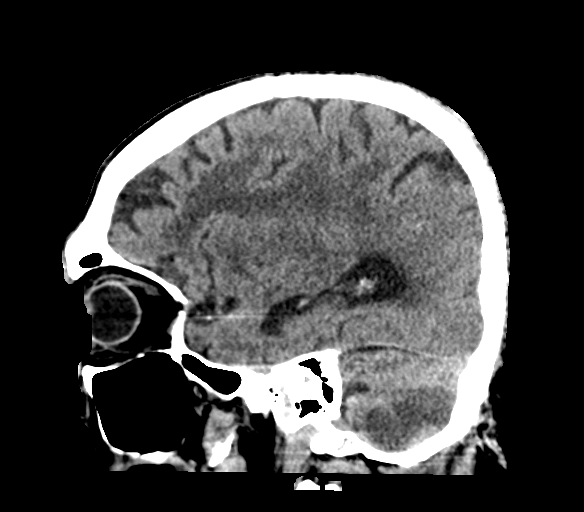
[im 31/61  brain]
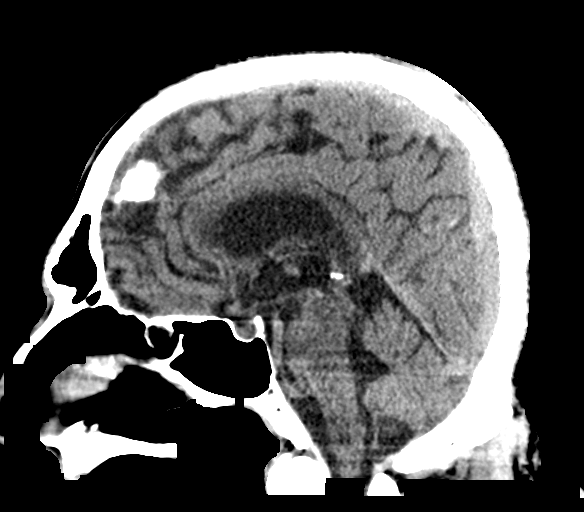
[im 41/61  brain]
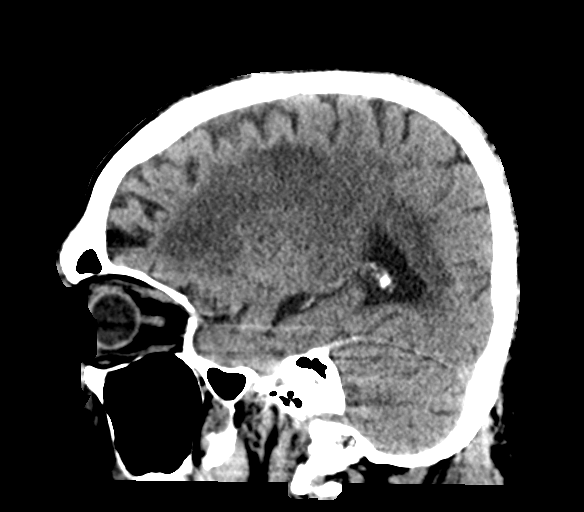

[Series 7: true axial · axial · 0.38mm/px · z∈[-162,+7]mm · 9 of 65 slices shown, 12 images]
[im 4/65  brain]
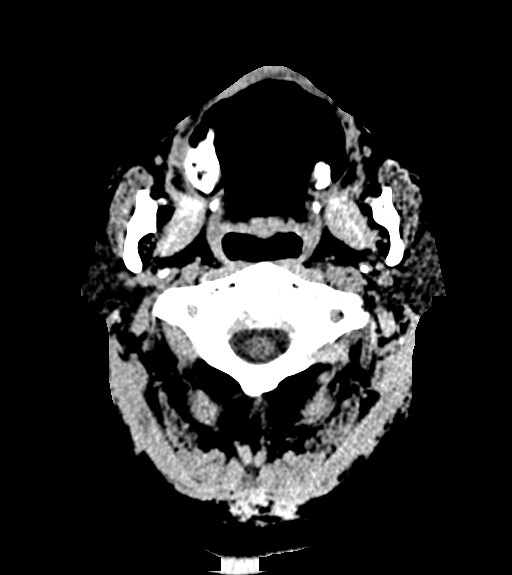
[im 4/65  bone]
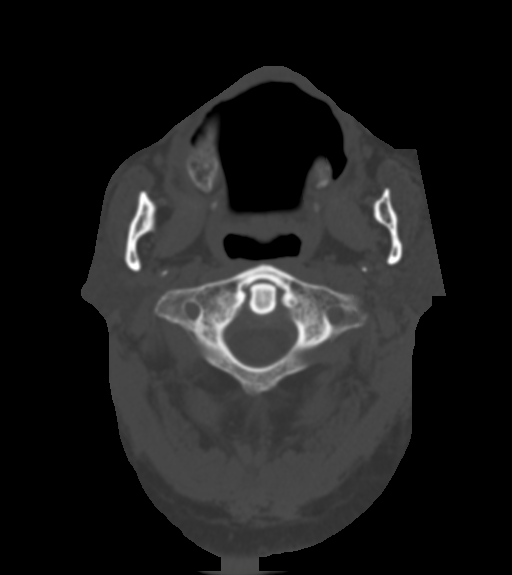
[im 11/65  brain]
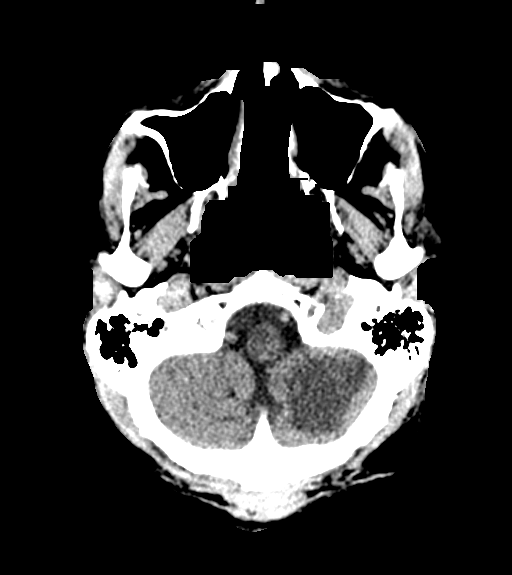
[im 18/65  brain]
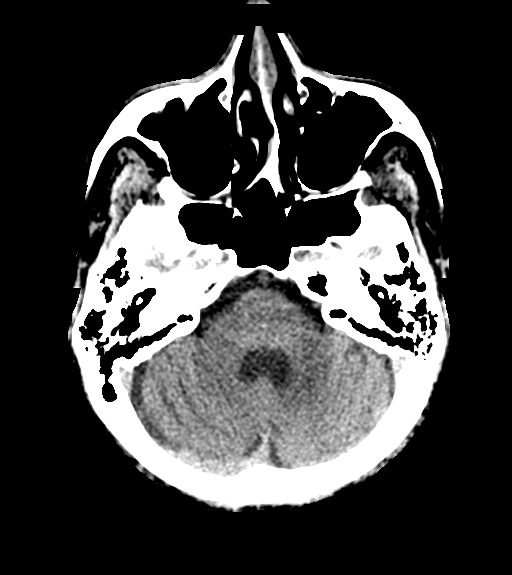
[im 25/65  brain]
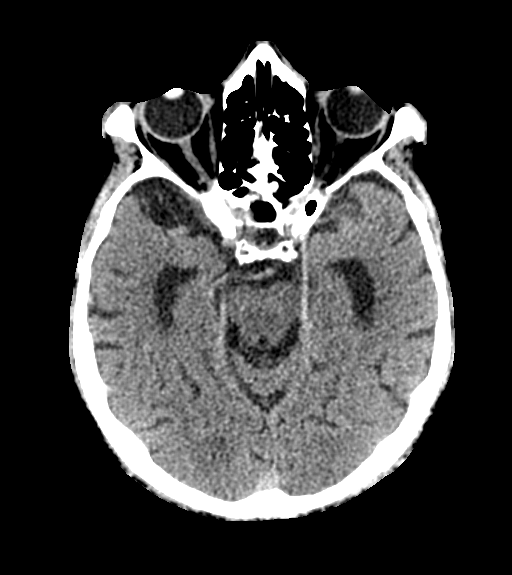
[im 33/65  brain]
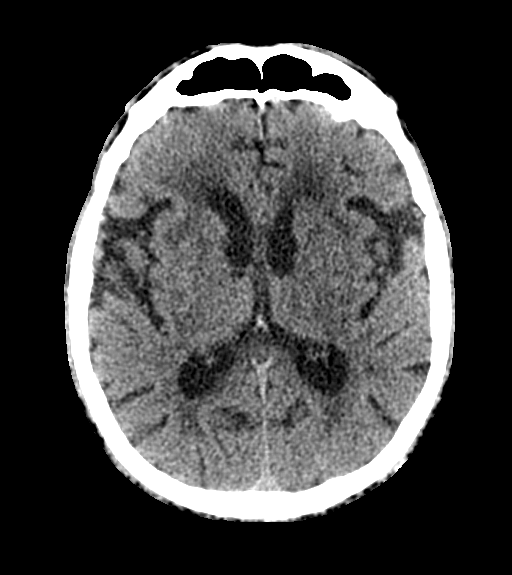
[im 33/65  bone]
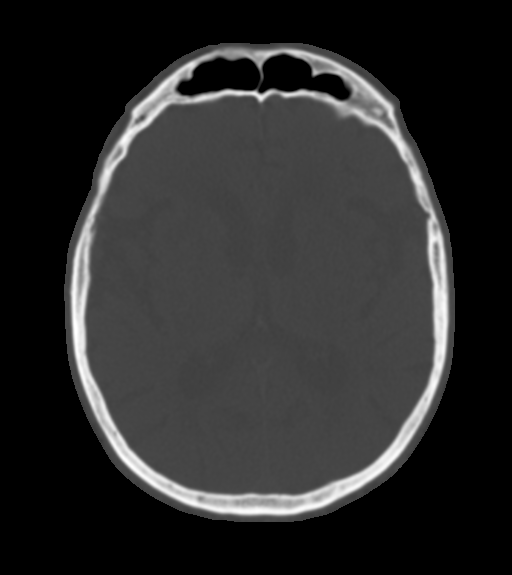
[im 40/65  brain]
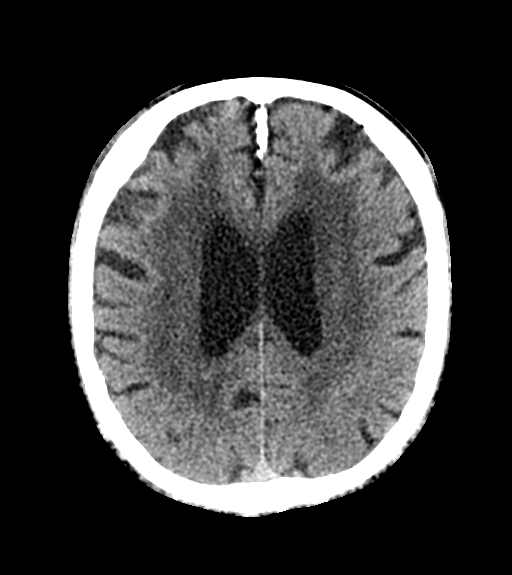
[im 47/65  brain]
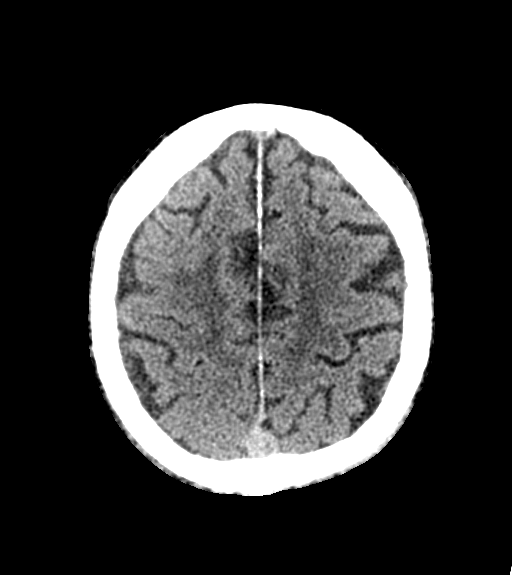
[im 54/65  brain]
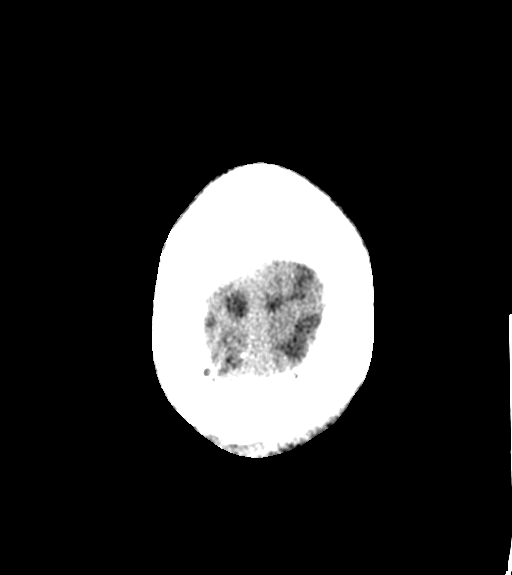
[im 61/65  brain]
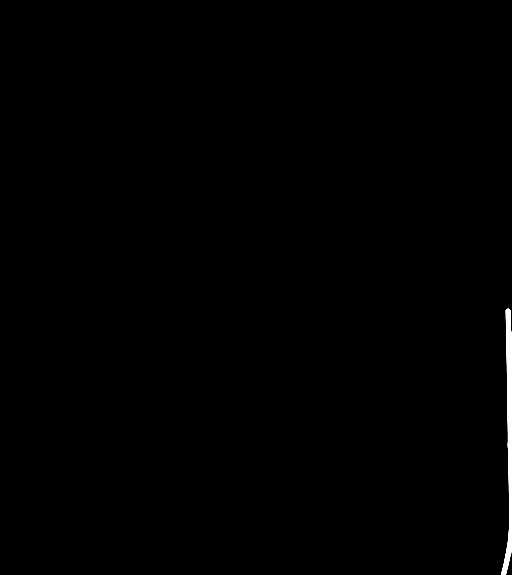
[im 61/65  bone]
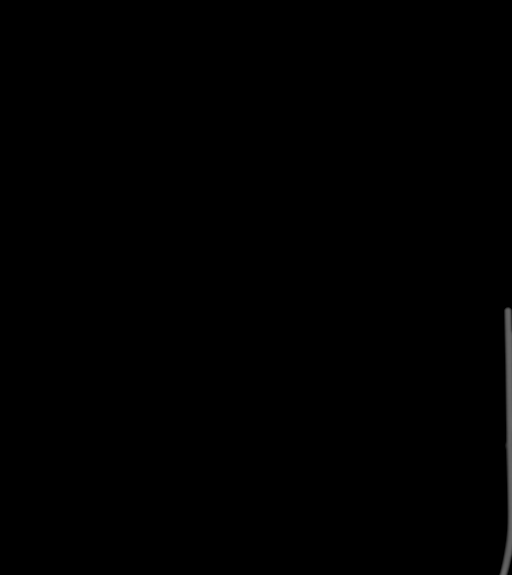

[15 of 47 positions shown; findings below may reference images not displayed]

FINDINGS: Brain: Interval moderate hypodensity within the left cerebellum
consistent with acute to subacute infarct. No hemorrhage. Atrophy
and advanced chronic small vessel ischemic changes of the white
matter. Stable ventricle size.

Vascular: No hyperdense vessels. Vertebral and carotid vascular
calcification

Skull: Normal. Negative for fracture or focal lesion.

Sinuses/Orbits: No acute finding.

Other: Moderate right forehead hematoma
IMPRESSION: 1. Interval development of moderate hypodensity within the left
cerebellum, consistent with acute to subacute infarct. No acute
intracranial hemorrhage is visualized.
2. Atrophy with advanced chronic small vessel ischemic changes of
the white matter.
3. Moderate right forehead scalp hematoma

## 2021-05-12 MED ORDER — LIDOCAINE-EPINEPHRINE-TETRACAINE (LET) TOPICAL GEL
3.0000 mL | Freq: Once | TOPICAL | Status: AC
  Administered 2021-05-12: 3 mL via TOPICAL
  Filled 2021-05-12: qty 3

## 2021-05-12 NOTE — ED Provider Notes (Signed)
He had a mechanical fall [facility] DEPT Provider Note   CSN: [id number] Arrival date & time: 05/12/21  2041     History Chief Complaint  Patient presents with   Laceration    Victor Fox is a 75 y.o. male who presents the emergency department for evaluation of a head laceration and closed head injury.  Patient states that he had a mechanical fall and struck his head on the ground after trying to get onto the stretcher.  Endorses loss of consciousness.  Denies vomiting, altered mental status.  Denies neck pain, numbness, tingling, weakness.  Denies blood thinner use.  Denies chest pain, shortness of breath, abdominal pain, or other traumatic or systemic complaints.   Laceration Associated symptoms: no fever and no rash       Past Medical History:  Diagnosis Date   Arthritis    Benign neoplasm of unspecified site    Carotid artery disease ([place])    a. carotid duplex 05/2015: less tahn 39% stenosis bilaterally, stable over the past year, recommend repeat imaging in 2 years (05/2017)   Compression fracture    a. L2   CVA (cerebral vascular accident) ([place]) 06/2012   Diabetes mellitus without complication ([place])    Heart murmur    a. echo 06/2012: EF >55%, LVH, aortic valve sclerosis   Hyperlipidemia    Hypertension    Neuropathy    Pneumonia    Tobacco abuse    a. smoked x 25 years   Vitamin D deficiency     Patient Active Problem List   Diagnosis Date Noted   Cognitive impairment    Chronic diastolic CHF (congestive heart failure) (HCC)    Lab test positive for detection of COVID-19 virus 04/24/2021   Self-care deficit 04/19/2021   Near syncope 85/46/2703   Acute metabolic encephalopathy 50/05/3817   Hypertensive urgency 10/19/2019   Acute encephalopathy 10/19/2019   Pain due to onychomycosis of toenails of both feet 02/21/2019   Adenomatous polyp 11/05/2018   Blood pressure elevated 11/05/2018   Neuropathy 11/05/2018   Pneumococcal  vaccination given 11/05/2018   Pulmonary nodules 10/08/2018   Coronary artery disease 10/08/2018   CAP (community acquired pneumonia) 09/01/2018   Overweight (BMI 25.0-29.9) 03/10/2018   Ataxia, 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   Benign neoplasm 01/19/2018   Onychomycosis due to dermatophyte 01/19/2018   Mononeuritis 01/19/2018   Neck pain 01/19/2018   Vitamin D deficiency 01/19/2018   Aortic valve stenosis 01/05/2018   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 01/05/2018   Dizziness 12/17/2017   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   Hyperlipidemia 04/14/2016   Pain and swelling of left wrist 07/10/2015   Carotid artery disease ([place])    Heart murmur 10/03/2013   HTN (hypertension) 10/03/2013   Acute CVA (cerebrovascular accident) ([place]) 10/03/2013   Carotid artery stenosis 10/03/2013    Past Surgical History:  Procedure Laterality Date   ARTHRODESIS     BACK SURGERY     x 2   discectomy     trigger release         Family History  Problem Relation Age of Onset   Heart disease Brother    Hypertension Mother     Social History   Tobacco Use   Smoking status: Former    Packs/day: 1.00    Years: 10.00    Pack years: 10.00    Types: Cigarettes   Smokeless tobacco: Never  Vaping Use  Vaping Use: Never used  Substance Use Topics   Alcohol use: No   Drug use: No    Home Medications Prior to Admission medications   Medication Sig Start Date End Date Taking? Authorizing Provider  amLODipine (NORVASC) 10 MG tablet Take 1 tablet (10 mg total) by mouth daily. Appointment needed please for further refills Patient not taking: No sig reported 10/20/19   Samuella Cota, MD  aspirin EC 81 MG tablet Take 81 mg by mouth daily. Patient not taking: Reported on 04/17/2021    [provider]  atorvastatin (LIPITOR) 40 MG tablet Take 1 tablet (40 mg total) by mouth at bedtime. Patient not taking: No sig reported 10/20/19    Samuella Cota, MD  B-D UF III MINI PEN NEEDLES 31G X 5 MM MISC use once daily 01/28/17   Roselee Nova, MD  blood glucose meter kit and supplies KIT Dispense based on patient and insurance preference. Use up to four times daily as directed. (FOR ICD-9 250.00, 250.01). 11/23/18   Poulose, Bethel Born, NP  enalapril (VASOTEC) 10 MG tablet Take 1 tablet (10 mg total) by mouth daily. 05/10/21 06/09/21  Wyvonnia Dusky, MD  Insulin Isophane & Regular Human (NOVOLIN 70/30 FLEXPEN) (70-30) 100 UNIT/ML PEN Inject 15 Units into the skin in the morning and at bedtime. 10/20/19   Samuella Cota, MD  Lancets [facility] DELICA PLUS [id number]) MISC USE QID UTD 11/23/18   [provider]  meclizine (ANTIVERT) 25 MG tablet Take 1 tablet (25 mg total) by mouth 3 (three) times daily as needed for dizziness. Patient not taking: Reported on 04/17/2021 01/08/20   Arta Silence, MD  ONE TOUCH ULTRA TEST test strip USE QID UTD 11/23/18   [provider]    Allergies    Patient has no known allergies.  Review of Systems   Review of Systems  Constitutional:  Negative for chills and fever.  HENT:  Negative for ear pain and sore throat.   Eyes:  Negative for pain and visual disturbance.  Respiratory:  Negative for cough and shortness of breath.   Cardiovascular:  Negative for chest pain and palpitations.  Gastrointestinal:  Negative for abdominal pain and vomiting.  Genitourinary:  Negative for dysuria and hematuria.  Musculoskeletal:  Negative for arthralgias and back pain.  Skin:  Positive for wound. Negative for color change and rash.  Neurological:  Negative for seizures and syncope.  All other systems reviewed and are negative.  Physical Exam Updated Vital Signs There were no vitals taken for this visit.  Physical Exam Vitals and nursing note reviewed.  Constitutional:      Appearance: He is well-developed.  HENT:     Head: Normocephalic.     Comments: To centimeter  eyebrow laceration, right; right forehead hematoma Eyes:     Conjunctiva/sclera: Conjunctivae normal.  Cardiovascular:     Rate and Rhythm: Normal rate and regular rhythm.     Heart sounds: No murmur heard. Pulmonary:     Effort: Pulmonary effort is normal. No respiratory distress.     Breath sounds: Normal breath sounds.  Abdominal:     Palpations: Abdomen is soft.     Tenderness: There is no abdominal tenderness.  Musculoskeletal:     Cervical back: Neck supple.  Skin:    General: Skin is warm and dry.  Neurological:     Mental Status: He is alert.    ED Results / Procedures / Treatments   Labs (all labs  ordered are listed, but only abnormal results are displayed) Labs Reviewed - No data to display  EKG None  Radiology No results found.  Procedures Procedures   Medications Ordered in ED Medications - No data to display  ED Course  I have reviewed the triage vital signs and the nursing notes.  Pertinent labs & imaging results that were available during my care of the patient were reviewed by me and considered in my medical decision making (see chart for details).    MDM Rules/Calculators/A&P                           Patient seen emergency department for evaluation of a fall with positive head strike.  Physical exam reveals a 1.5 cm laceration in the eyebrow on the right.  Bleeding controlled.  Physical exam also with a hematoma over the right forehead.  Neurologic exam unremarkable with no focal motor or sensory deficits.  Initial blood glucose 145.  Laceration repaired using Dermabond.  CT head is currently pending.  Patient signed out to oncoming provider.  Please see provider's note for continuation of work-up. Final Clinical Impression(s) / ED Diagnoses Final diagnoses:  None    Rx / DC Orders ED Discharge Orders     None        Shalie Schremp, Debe Coder, MD 05/12/21 2257

## 2021-05-12 NOTE — ED Notes (Signed)
Took pt to bathroom using a steady but pt leaned a little to one side on the steady and was shaking while on the toilet and looked like he would fall off the commode.  Pt should only use bed pan.

## 2021-05-12 NOTE — ED Triage Notes (Signed)
Pt BIB [facility] from [place]. Staff reports fall at 1845 today. Pt hit head and has laceration to right eyebrow. Denies LOC. Pt on blood thinners. Hx of CVAs.

## 2021-05-13 ENCOUNTER — Inpatient Hospital Stay (HOSPITAL_COMMUNITY): Payer: Medicare Other

## 2021-05-13 ENCOUNTER — Encounter (HOSPITAL_COMMUNITY): Payer: Self-pay | Admitting: Internal Medicine

## 2021-05-13 DIAGNOSIS — I10 Essential (primary) hypertension: Secondary | ICD-10-CM

## 2021-05-13 DIAGNOSIS — Z8616 Personal history of COVID-19: Secondary | ICD-10-CM | POA: Diagnosis not present

## 2021-05-13 DIAGNOSIS — Z20822 Contact with and (suspected) exposure to covid-19: Secondary | ICD-10-CM | POA: Diagnosis present

## 2021-05-13 DIAGNOSIS — W1830XA Fall on same level, unspecified, initial encounter: Secondary | ICD-10-CM | POA: Diagnosis present

## 2021-05-13 DIAGNOSIS — I639 Cerebral infarction, unspecified: Secondary | ICD-10-CM | POA: Diagnosis present

## 2021-05-13 DIAGNOSIS — Z87891 Personal history of nicotine dependence: Secondary | ICD-10-CM | POA: Diagnosis not present

## 2021-05-13 DIAGNOSIS — E1169 Type 2 diabetes mellitus with other specified complication: Secondary | ICD-10-CM | POA: Diagnosis present

## 2021-05-13 DIAGNOSIS — E782 Mixed hyperlipidemia: Secondary | ICD-10-CM | POA: Diagnosis present

## 2021-05-13 DIAGNOSIS — R4182 Altered mental status, unspecified: Secondary | ICD-10-CM | POA: Diagnosis not present

## 2021-05-13 DIAGNOSIS — R29703 NIHSS score 3: Secondary | ICD-10-CM | POA: Diagnosis present

## 2021-05-13 DIAGNOSIS — I6521 Occlusion and stenosis of right carotid artery: Secondary | ICD-10-CM | POA: Diagnosis present

## 2021-05-13 DIAGNOSIS — W19XXXA Unspecified fall, initial encounter: Secondary | ICD-10-CM | POA: Diagnosis not present

## 2021-05-13 DIAGNOSIS — I6523 Occlusion and stenosis of bilateral carotid arteries: Secondary | ICD-10-CM | POA: Diagnosis present

## 2021-05-13 DIAGNOSIS — R471 Dysarthria and anarthria: Secondary | ICD-10-CM | POA: Diagnosis present

## 2021-05-13 DIAGNOSIS — I2510 Atherosclerotic heart disease of native coronary artery without angina pectoris: Secondary | ICD-10-CM | POA: Diagnosis present

## 2021-05-13 DIAGNOSIS — Z66 Do not resuscitate: Secondary | ICD-10-CM | POA: Diagnosis not present

## 2021-05-13 DIAGNOSIS — I350 Nonrheumatic aortic (valve) stenosis: Secondary | ICD-10-CM | POA: Diagnosis not present

## 2021-05-13 DIAGNOSIS — I110 Hypertensive heart disease with heart failure: Secondary | ICD-10-CM | POA: Diagnosis present

## 2021-05-13 DIAGNOSIS — I63432 Cerebral infarction due to embolism of left posterior cerebral artery: Secondary | ICD-10-CM | POA: Diagnosis present

## 2021-05-13 DIAGNOSIS — Z794 Long term (current) use of insulin: Secondary | ICD-10-CM

## 2021-05-13 DIAGNOSIS — R451 Restlessness and agitation: Secondary | ICD-10-CM | POA: Diagnosis not present

## 2021-05-13 DIAGNOSIS — E1165 Type 2 diabetes mellitus with hyperglycemia: Secondary | ICD-10-CM | POA: Diagnosis present

## 2021-05-13 DIAGNOSIS — I779 Disorder of arteries and arterioles, unspecified: Secondary | ICD-10-CM | POA: Diagnosis not present

## 2021-05-13 DIAGNOSIS — G9341 Metabolic encephalopathy: Secondary | ICD-10-CM | POA: Diagnosis not present

## 2021-05-13 DIAGNOSIS — I5032 Chronic diastolic (congestive) heart failure: Secondary | ICD-10-CM

## 2021-05-13 DIAGNOSIS — T7611XA Adult physical abuse, suspected, initial encounter: Secondary | ICD-10-CM | POA: Diagnosis present

## 2021-05-13 DIAGNOSIS — Z515 Encounter for palliative care: Secondary | ICD-10-CM | POA: Diagnosis not present

## 2021-05-13 DIAGNOSIS — E859 Amyloidosis, unspecified: Secondary | ICD-10-CM | POA: Diagnosis present

## 2021-05-13 DIAGNOSIS — I63532 Cerebral infarction due to unspecified occlusion or stenosis of left posterior cerebral artery: Secondary | ICD-10-CM | POA: Diagnosis not present

## 2021-05-13 DIAGNOSIS — E1142 Type 2 diabetes mellitus with diabetic polyneuropathy: Secondary | ICD-10-CM | POA: Diagnosis present

## 2021-05-13 DIAGNOSIS — E1151 Type 2 diabetes mellitus with diabetic peripheral angiopathy without gangrene: Secondary | ICD-10-CM | POA: Diagnosis present

## 2021-05-13 DIAGNOSIS — I352 Nonrheumatic aortic (valve) stenosis with insufficiency: Secondary | ICD-10-CM | POA: Diagnosis present

## 2021-05-13 DIAGNOSIS — S0181XA Laceration without foreign body of other part of head, initial encounter: Secondary | ICD-10-CM | POA: Diagnosis present

## 2021-05-13 DIAGNOSIS — S01111A Laceration without foreign body of right eyelid and periocular area, initial encounter: Secondary | ICD-10-CM | POA: Diagnosis present

## 2021-05-13 DIAGNOSIS — D72829 Elevated white blood cell count, unspecified: Secondary | ICD-10-CM | POA: Diagnosis not present

## 2021-05-13 DIAGNOSIS — R29810 Facial weakness: Secondary | ICD-10-CM | POA: Diagnosis present

## 2021-05-13 LAB — DIFFERENTIAL
Abs Immature Granulocytes: 0.05 10*3/uL (ref 0.00–0.07)
Basophils Absolute: 0 10*3/uL (ref 0.0–0.1)
Basophils Relative: 0 %
Eosinophils Absolute: 0 10*3/uL (ref 0.0–0.5)
Eosinophils Relative: 0 %
Immature Granulocytes: 0 %
Lymphocytes Relative: 11 %
Lymphs Abs: 1.5 10*3/uL (ref 0.7–4.0)
Monocytes Absolute: 0.6 10*3/uL (ref 0.1–1.0)
Monocytes Relative: 4 %
Neutro Abs: 11.1 10*3/uL — ABNORMAL HIGH (ref 1.7–7.7)
Neutrophils Relative %: 85 %

## 2021-05-13 LAB — CBC
HCT: 46.9 % (ref 39.0–52.0)
Hemoglobin: 15.9 g/dL (ref 13.0–17.0)
MCH: 29.1 pg (ref 26.0–34.0)
MCHC: 33.9 g/dL (ref 30.0–36.0)
MCV: 85.7 fL (ref 80.0–100.0)
Platelets: 198 10*3/uL (ref 150–400)
RBC: 5.47 MIL/uL (ref 4.22–5.81)
RDW: 13.6 % (ref 11.5–15.5)
WBC: 13.3 10*3/uL — ABNORMAL HIGH (ref 4.0–10.5)
nRBC: 0 % (ref 0.0–0.2)

## 2021-05-13 LAB — COMPREHENSIVE METABOLIC PANEL
ALT: 17 U/L (ref 0–44)
ALT: 18 U/L (ref 0–44)
AST: 16 U/L (ref 15–41)
AST: 17 U/L (ref 15–41)
Albumin: 3.4 g/dL — ABNORMAL LOW (ref 3.5–5.0)
Albumin: 3.8 g/dL (ref 3.5–5.0)
Alkaline Phosphatase: 71 U/L (ref 38–126)
Alkaline Phosphatase: 79 U/L (ref 38–126)
Anion gap: 13 (ref 5–15)
Anion gap: 9 (ref 5–15)
BUN: 18 mg/dL (ref 8–23)
BUN: 19 mg/dL (ref 8–23)
CO2: 27 mmol/L (ref 22–32)
CO2: 27 mmol/L (ref 22–32)
Calcium: 8.9 mg/dL (ref 8.9–10.3)
Calcium: 9.3 mg/dL (ref 8.9–10.3)
Chloride: 98 mmol/L (ref 98–111)
Chloride: 98 mmol/L (ref 98–111)
Creatinine, Ser: 0.81 mg/dL (ref 0.61–1.24)
Creatinine, Ser: 0.84 mg/dL (ref 0.61–1.24)
GFR, Estimated: >60 mL/min (ref >60)
GFR, Estimated: >60 mL/min (ref >60)
Glucose, Bld: 206 mg/dL — ABNORMAL HIGH (ref 70–99)
Glucose, Bld: 220 mg/dL — ABNORMAL HIGH (ref 70–99)
Potassium: 4.1 mmol/L (ref 3.5–5.1)
Potassium: 4.4 mmol/L (ref 3.5–5.1)
Sodium: 134 mmol/L — ABNORMAL LOW (ref 135–145)
Sodium: 138 mmol/L (ref 135–145)
Total Bilirubin: 1.2 mg/dL (ref 0.3–1.2)
Total Bilirubin: 1.5 mg/dL — ABNORMAL HIGH (ref 0.3–1.2)
Total Protein: 6.4 g/dL — ABNORMAL LOW (ref 6.5–8.1)
Total Protein: 6.9 g/dL (ref 6.5–8.1)

## 2021-05-13 LAB — CBC WITH DIFFERENTIAL/PLATELET
Abs Immature Granulocytes: 0.04 10*3/uL (ref 0.00–0.07)
Basophils Absolute: 0 10*3/uL (ref 0.0–0.1)
Basophils Relative: 0 %
Eosinophils Absolute: 0 10*3/uL (ref 0.0–0.5)
Eosinophils Relative: 0 %
HCT: 43.9 % (ref 39.0–52.0)
Hemoglobin: 14.7 g/dL (ref 13.0–17.0)
Immature Granulocytes: 0 %
Lymphocytes Relative: 20 %
Lymphs Abs: 2.2 10*3/uL (ref 0.7–4.0)
MCH: 29 pg (ref 26.0–34.0)
MCHC: 33.5 g/dL (ref 30.0–36.0)
MCV: 86.6 fL (ref 80.0–100.0)
Monocytes Absolute: 0.7 10*3/uL (ref 0.1–1.0)
Monocytes Relative: 6 %
Neutro Abs: 8.2 10*3/uL — ABNORMAL HIGH (ref 1.7–7.7)
Neutrophils Relative %: 74 %
Platelets: 192 10*3/uL (ref 150–400)
RBC: 5.07 MIL/uL (ref 4.22–5.81)
RDW: 13.6 % (ref 11.5–15.5)
WBC: 11.1 10*3/uL — ABNORMAL HIGH (ref 4.0–10.5)
nRBC: 0 % (ref 0.0–0.2)

## 2021-05-13 LAB — RAPID URINE DRUG SCREEN, HOSP PERFORMED
Amphetamines: NOT DETECTED
Barbiturates: NOT DETECTED
Benzodiazepines: NOT DETECTED
Cocaine: NOT DETECTED
Opiates: NOT DETECTED
Tetrahydrocannabinol: NOT DETECTED

## 2021-05-13 LAB — CBG MONITORING, ED
Glucose-Capillary: 130 mg/dL — ABNORMAL HIGH (ref 70–99)
Glucose-Capillary: 178 mg/dL — ABNORMAL HIGH (ref 70–99)

## 2021-05-13 LAB — APTT
aPTT: 28 seconds (ref 24–36)
aPTT: 28 seconds (ref 24–36)

## 2021-05-13 LAB — URINALYSIS, ROUTINE W REFLEX MICROSCOPIC
Bacteria, UA: NONE SEEN
Bilirubin Urine: NEGATIVE
Glucose, UA: NEGATIVE mg/dL
Hgb urine dipstick: NEGATIVE
Ketones, ur: 15 mg/dL — AB
Leukocytes,Ua: NEGATIVE
Nitrite: NEGATIVE
Protein, ur: NEGATIVE mg/dL
Specific Gravity, Urine: >=1.03 (ref 1.005–1.030)
pH: 6 (ref 5.0–8.0)

## 2021-05-13 LAB — PROTIME-INR
INR: 1.1 (ref 0.8–1.2)
INR: 1.1 (ref 0.8–1.2)
Prothrombin Time: 14.2 seconds (ref 11.4–15.2)
Prothrombin Time: 14.4 seconds (ref 11.4–15.2)

## 2021-05-13 LAB — RESP PANEL BY RT-PCR (FLU A&B, COVID) ARPGX2
Influenza A by PCR: NEGATIVE
Influenza B by PCR: NEGATIVE
SARS Coronavirus 2 by RT PCR: NEGATIVE

## 2021-05-13 LAB — ETHANOL: Alcohol, Ethyl (B): <10 mg/dL (ref <10)

## 2021-05-13 LAB — MAGNESIUM: Magnesium: 2.3 mg/dL (ref 1.7–2.4)

## 2021-05-13 LAB — I-STAT BETA HCG BLOOD, ED (MC, WL, AP ONLY): I-stat hCG, quantitative: <5 m[IU]/mL (ref <5)

## 2021-05-13 IMAGING — CT CT ANGIO NECK
2 of 7 series · 8 of 33 positions shown · IV contrast (omnipaque)
Comparison: Plain head CT [DP] hours last night. CTA head and neck
[DATE].

CT chest [DATE].

CLINICAL DATA: 75-year-old male with neurologic deficit. Fall.
Hypodense cerebellum on earlier plain head CT compatible with left
PICA infarct.

EXAM:
CT ANGIOGRAPHY HEAD AND NECK
TECHNIQUE: Multidetector CT imaging of the head and neck was performed using
the standard protocol during bolus administration of intravenous
contrast. Multiplanar CT image reconstructions and MIPs were
obtained to evaluate the vascular anatomy. Carotid stenosis
measurements (when applicable) are obtained utilizing NASCET
criteria, using the distal internal carotid diameter as the
denominator.
CONTRAST:  100mL OMNIPAQUE IOHEXOL 350 MG/ML SOLN

[Series 5: cta head neck · axial · 0.47mm/px · z∈[-255,-125]mm · 2 of 195 slices shown]
[im 65/195  soft-tissue]
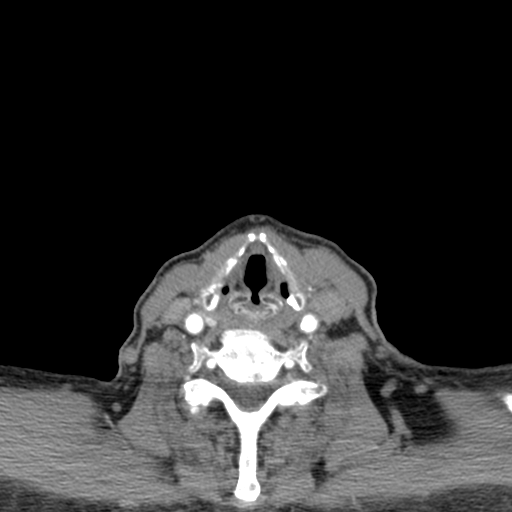
[im 130/195  soft-tissue]
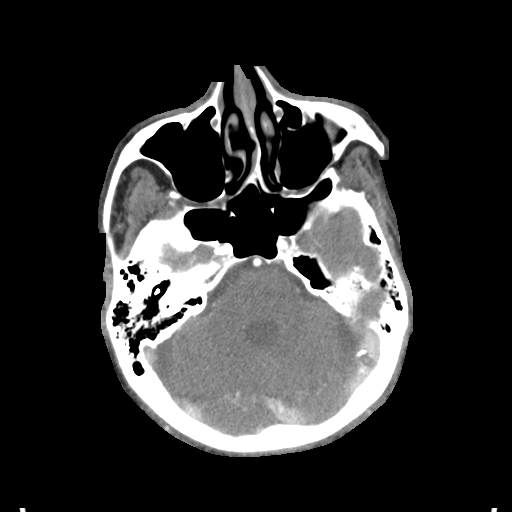

[Series 7: ax thin · axial · 0.40mm/px · z∈[-327,-51]mm · 6 of 388 slices shown]
[im 56/388  soft-tissue]
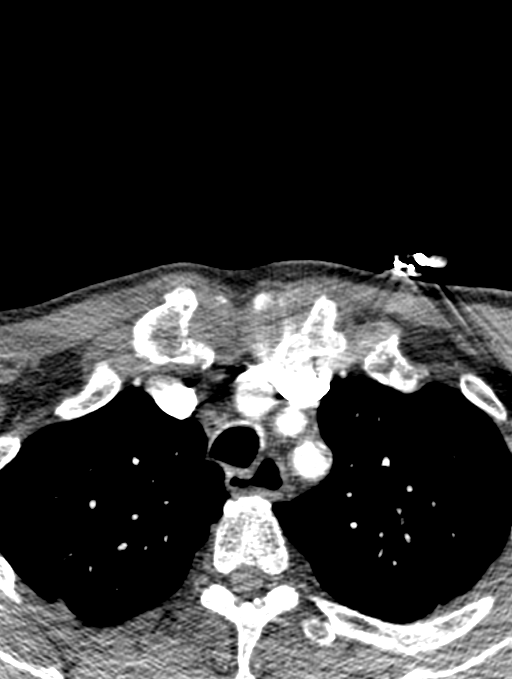
[im 111/388  bone]
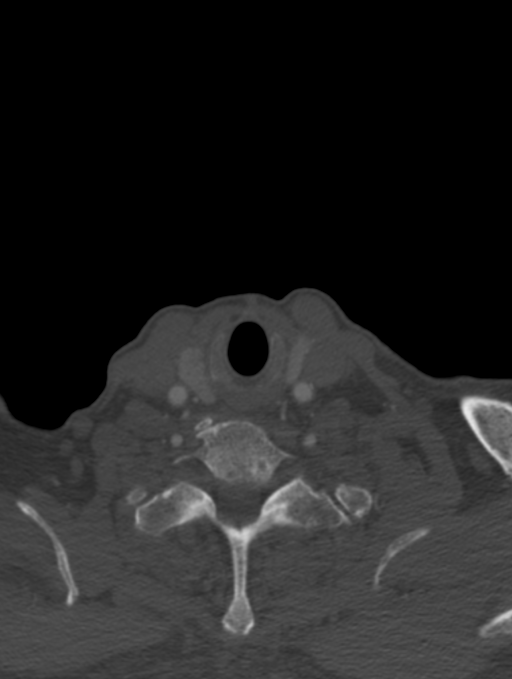
[im 166/388  soft-tissue]
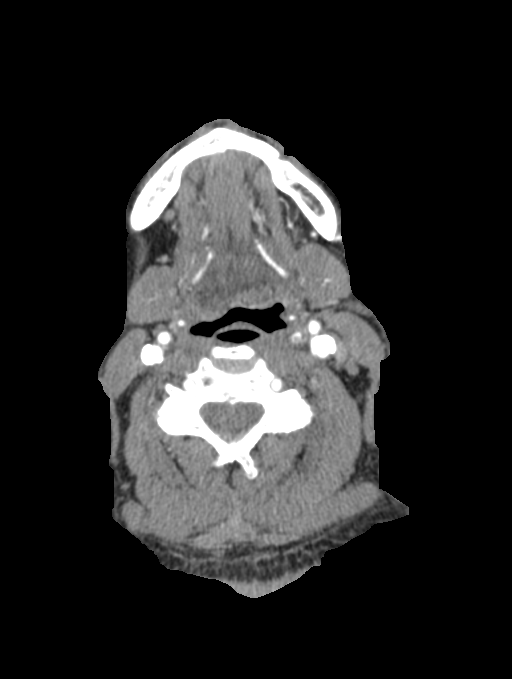
[im 222/388  bone]
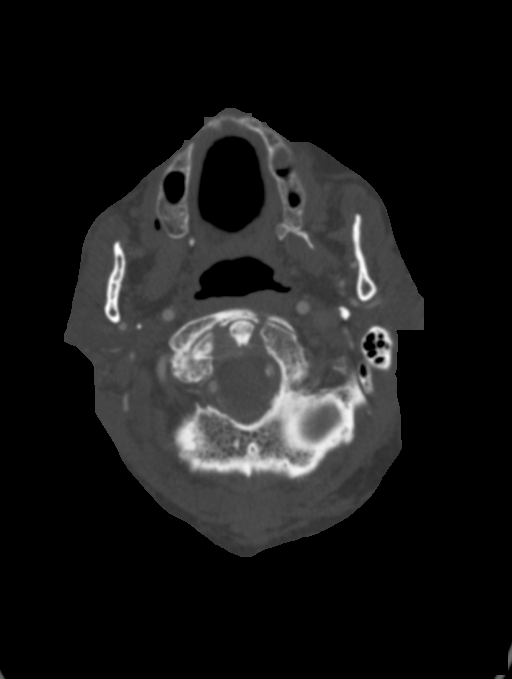
[im 277/388  soft-tissue]
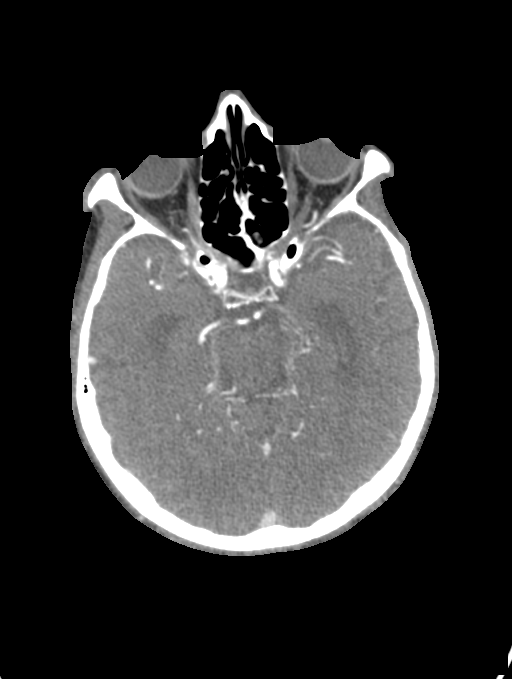
[im 332/388  bone]
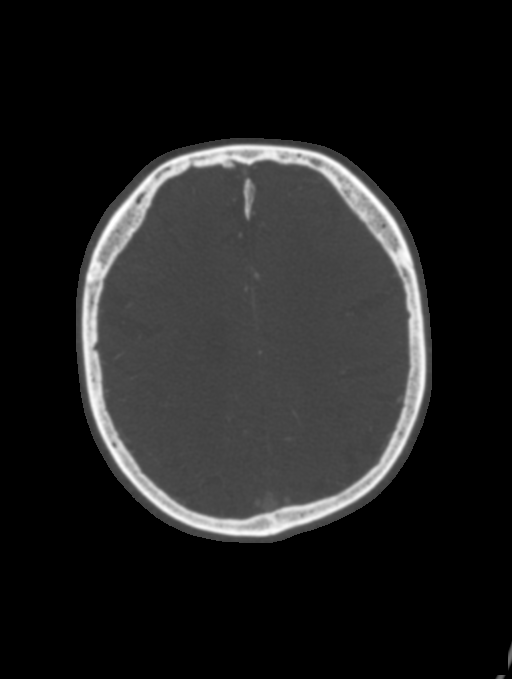

[8 of 33 positions shown; findings below may reference images not displayed]

FINDINGS: CTA NECK

Skeleton: Absent maxillary dentition. Cervical spine and
craniocervical junction degeneration. C5-C6 ankylosis. No acute
osseous abnormality identified.

Upper chest:

Chronic left upper lobe peribronchial nodularity extending to the
apical pleura. This has not significantly changed since [DP]

And most resembles impacted distal airways and/or chronic post
inflammatory changes. Visible central pulmonary arteries are patent.
No superior mediastinal lymphadenopathy.

Other neck: Small volume retained secretions layering in the
pharynx. Otherwise negative.

Aortic arch: Calcified aortic atherosclerosis. Three vessel arch
configuration.

Right carotid system: Brachiocephalic artery plaque without
stenosis. Mild proximal right CCA soft plaque without stenosis.
Calcified plaque in the mid right CCA before the bifurcation without
stenosis. Moderate calcified plaque at the right carotid bifurcation
and continuing into the ICA bulb. Bulky calcified plaque just distal
to the bulb with 60 % stenosis with respect to the distal vessel
(series 5, image 106), stable. The right ICA remains patent to the
skull base.

Left carotid system: Left CCA soft and calcified plaque without
stenosis before the bifurcation. Circumferential calcified plaque at
the left ICA origin and bulb with less than 50 % stenosis with
respect to the distal vessel is stable from last month.

Vertebral arteries:

Proximal right subclavian artery plaque is mild without stenosis.
Calcified plaque at the right vertebral artery origin only results
in mild stenosis on series 8, image 174. Right vertebral is mildly
dominant and patent to the skull base without additional plaque or
stenosis.

Soft and calcified proximal left subclavian artery plaque without
significant stenosis. Left vertebral artery origin is normal on
series 8, image 174. Left V1 segment is tortuous and there is distal
V1 calcified plaque although with only mild stenosis. The left
vertebral is codominant. In the left mid V2 segment at C4 there is
stable high-grade stenosis (series 7, image 232) which appears
related to soft plaque. The vessel remains patent to the skull base
with mild V3 segment plaque but no other extracranial stenosis.

CTA HEAD

Posterior circulation:

Distal right vertebral artery is patent to the basilar with focal V4
segment plaque resulting in mild to moderate stenosis on series 7,
image 148. The left V4 segment is irregular with a greater degree of
calcified plaque and moderate stenosis but remains patent to the
basilar. And the left PICA origin is patent on series 7, image 149
and appears stable.

Patent basilar artery without stenosis. Patent SCA and PCA origins.
Posterior communicating arteries are diminutive or absent. Left PCA
branches are within normal limits. At the right PCA bifurcation
there is mild to moderate stenosis but the distal right PCA branches
remain patent (series 12, image 17).

Anterior circulation:

Both ICA siphons are patent. Calcified siphon plaque on the left
results in mild stenosis. Normal left ophthalmic artery origin.
Calcified plaque on the right results in mild to moderate stenosis
at both the precavernous and proximal supraclinoid segments.

Patent carotid termini, MCA and ACA origins. Diminutive or absent
anterior communicating artery. Bilateral ACA branches are patent.
There is moderate distal left A2 stenosis which is stable on series
12, image 22. Right MCA M1 segment and bifurcation are patent
without stenosis. Right MCA branches are patent with mild
irregularity. Left MCA M1 segment is irregular but with only mild
stenosis. Left MCA bifurcation is patent also with mild stenosis.
And there is moderate stenosis of a proximal middle M2 branch
suspected on series 9, image 142. Left MCA branches are stable since
last month.

Venous sinuses: Early contrast timing but patent.

Anatomic variants: None.

Review of the MIP images confirms the above findings
IMPRESSION: 1. Negative for large vessel occlusion.
Bilateral Vertebral Artery atherosclerosis is stable from the CTA
last month, with Severe focal stenosis of the Left Vertebral V2
segment at the C4 level, and moderate left V4 plaque and stenosis.
But the Left Vertebral Artery remains patent. The Left PICA origin
remains patent and normal.

2. Other extra, and intracranial atherosclerosis appears stable
since last month, notable for:
- Right ICA bulb plaque with 60% stenosis
- Moderate stenosis right ICA siphon due to calcified plaque.
- Moderate stenosis left ACA distal A2 segment.
- mild left MCA M1 stenosis, and up to Moderate stenosis of a middle
Left M2 branch.
- mild to moderate stenosis at the right PCA bifurcation.

3. Chronic left upper lobe peribronchial nodularity, not
significantly changed from [DP] and most resembles impacted distal
airways and/or chronic post inflammatory change.

4. Aortic Atherosclerosis ([DP]-[DP]).

## 2021-05-13 IMAGING — CT CT ANGIO HEAD
2 of 7 series · 8 of 33 positions shown · IV contrast (omnipaque)
Comparison: Plain head CT [DP] hours last night. CTA head and neck
[DATE].

CT chest [DATE].

CLINICAL DATA: 75-year-old male with neurologic deficit. Fall.
Hypodense cerebellum on earlier plain head CT compatible with left
PICA infarct.

EXAM:
CT ANGIOGRAPHY HEAD AND NECK
TECHNIQUE: Multidetector CT imaging of the head and neck was performed using
the standard protocol during bolus administration of intravenous
contrast. Multiplanar CT image reconstructions and MIPs were
obtained to evaluate the vascular anatomy. Carotid stenosis
measurements (when applicable) are obtained utilizing NASCET
criteria, using the distal internal carotid diameter as the
denominator.
CONTRAST:  100mL OMNIPAQUE IOHEXOL 350 MG/ML SOLN

[Series 5: cta head neck · axial · 0.47mm/px · z∈[-255,-125]mm · 2 of 195 slices shown]
[im 65/195  soft-tissue]
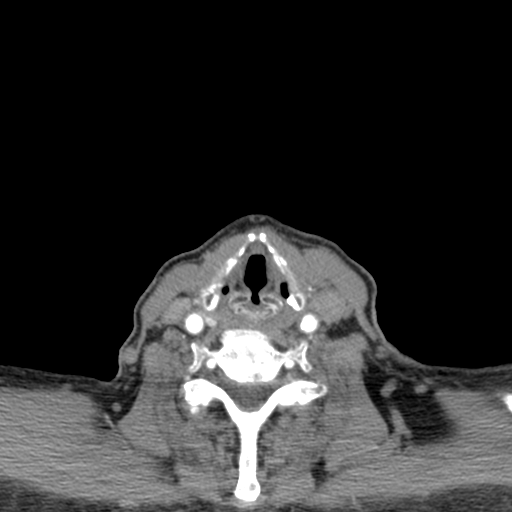
[im 130/195  soft-tissue]
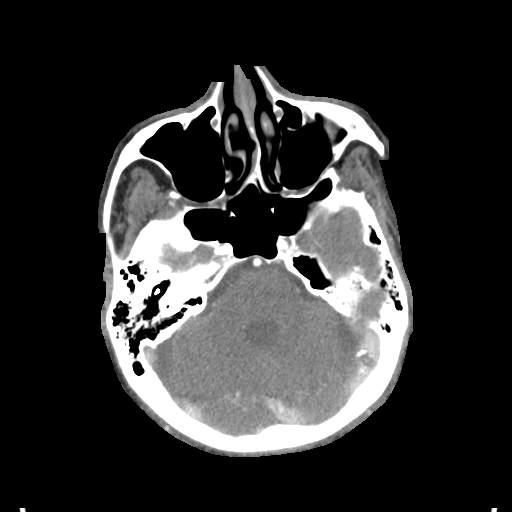

[Series 7: ax thin · axial · 0.40mm/px · z∈[-327,-51]mm · 6 of 388 slices shown]
[im 56/388  soft-tissue]
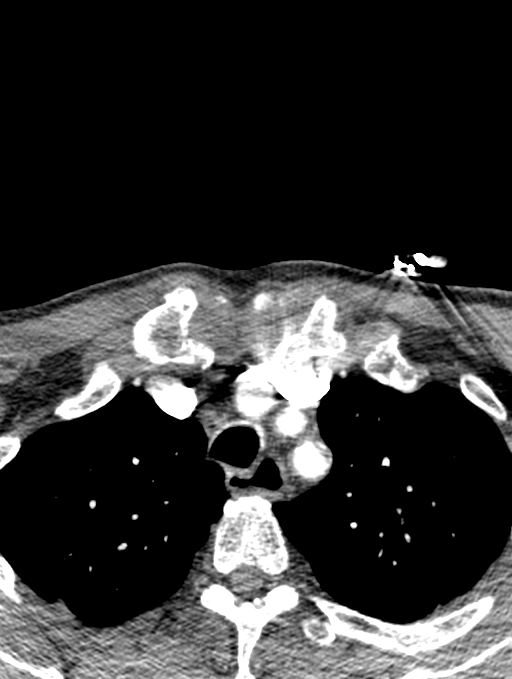
[im 111/388  bone]
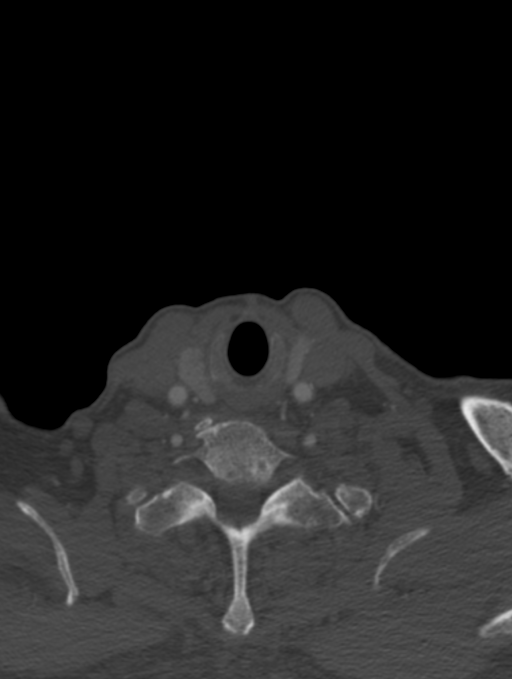
[im 166/388  soft-tissue]
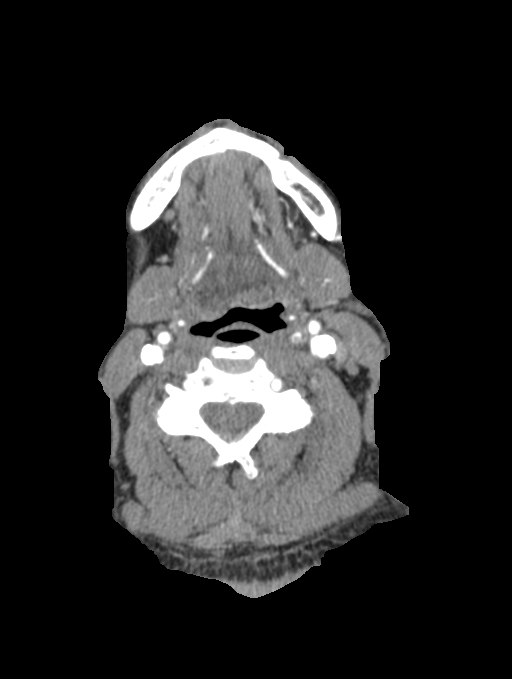
[im 222/388  bone]
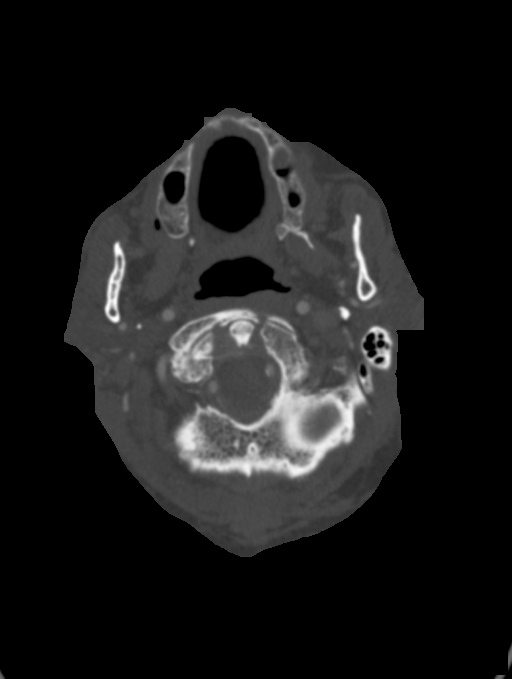
[im 277/388  soft-tissue]
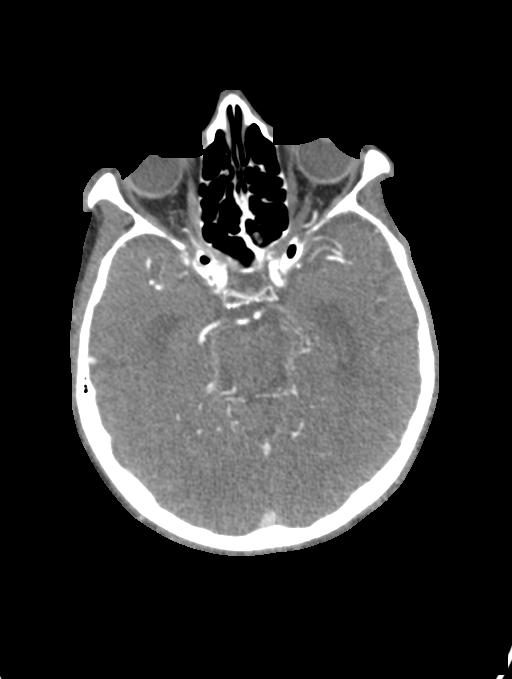
[im 332/388  bone]
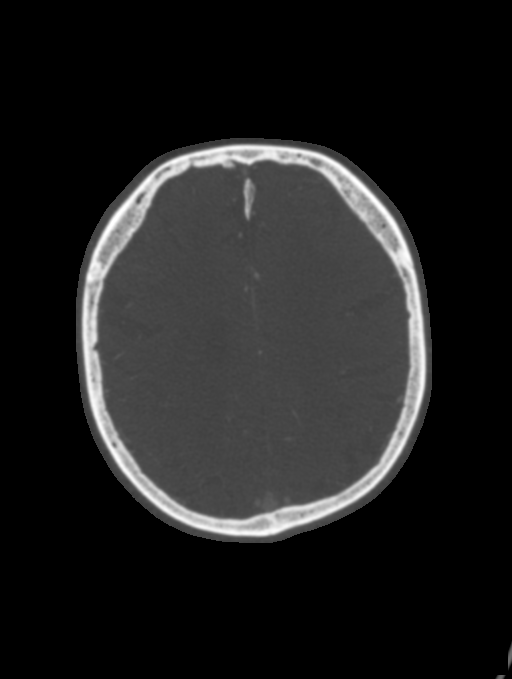

[8 of 33 positions shown; findings below may reference images not displayed]

FINDINGS: CTA NECK

Skeleton: Absent maxillary dentition. Cervical spine and
craniocervical junction degeneration. C5-C6 ankylosis. No acute
osseous abnormality identified.

Upper chest:

Chronic left upper lobe peribronchial nodularity extending to the
apical pleura. This has not significantly changed since [DP]

And most resembles impacted distal airways and/or chronic post
inflammatory changes. Visible central pulmonary arteries are patent.
No superior mediastinal lymphadenopathy.

Other neck: Small volume retained secretions layering in the
pharynx. Otherwise negative.

Aortic arch: Calcified aortic atherosclerosis. Three vessel arch
configuration.

Right carotid system: Brachiocephalic artery plaque without
stenosis. Mild proximal right CCA soft plaque without stenosis.
Calcified plaque in the mid right CCA before the bifurcation without
stenosis. Moderate calcified plaque at the right carotid bifurcation
and continuing into the ICA bulb. Bulky calcified plaque just distal
to the bulb with 60 % stenosis with respect to the distal vessel
(series 5, image 106), stable. The right ICA remains patent to the
skull base.

Left carotid system: Left CCA soft and calcified plaque without
stenosis before the bifurcation. Circumferential calcified plaque at
the left ICA origin and bulb with less than 50 % stenosis with
respect to the distal vessel is stable from last month.

Vertebral arteries:

Proximal right subclavian artery plaque is mild without stenosis.
Calcified plaque at the right vertebral artery origin only results
in mild stenosis on series 8, image 174. Right vertebral is mildly
dominant and patent to the skull base without additional plaque or
stenosis.

Soft and calcified proximal left subclavian artery plaque without
significant stenosis. Left vertebral artery origin is normal on
series 8, image 174. Left V1 segment is tortuous and there is distal
V1 calcified plaque although with only mild stenosis. The left
vertebral is codominant. In the left mid V2 segment at C4 there is
stable high-grade stenosis (series 7, image 232) which appears
related to soft plaque. The vessel remains patent to the skull base
with mild V3 segment plaque but no other extracranial stenosis.

CTA HEAD

Posterior circulation:

Distal right vertebral artery is patent to the basilar with focal V4
segment plaque resulting in mild to moderate stenosis on series 7,
image 148. The left V4 segment is irregular with a greater degree of
calcified plaque and moderate stenosis but remains patent to the
basilar. And the left PICA origin is patent on series 7, image 149
and appears stable.

Patent basilar artery without stenosis. Patent SCA and PCA origins.
Posterior communicating arteries are diminutive or absent. Left PCA
branches are within normal limits. At the right PCA bifurcation
there is mild to moderate stenosis but the distal right PCA branches
remain patent (series 12, image 17).

Anterior circulation:

Both ICA siphons are patent. Calcified siphon plaque on the left
results in mild stenosis. Normal left ophthalmic artery origin.
Calcified plaque on the right results in mild to moderate stenosis
at both the precavernous and proximal supraclinoid segments.

Patent carotid termini, MCA and ACA origins. Diminutive or absent
anterior communicating artery. Bilateral ACA branches are patent.
There is moderate distal left A2 stenosis which is stable on series
12, image 22. Right MCA M1 segment and bifurcation are patent
without stenosis. Right MCA branches are patent with mild
irregularity. Left MCA M1 segment is irregular but with only mild
stenosis. Left MCA bifurcation is patent also with mild stenosis.
And there is moderate stenosis of a proximal middle M2 branch
suspected on series 9, image 142. Left MCA branches are stable since
last month.

Venous sinuses: Early contrast timing but patent.

Anatomic variants: None.

Review of the MIP images confirms the above findings
IMPRESSION: 1. Negative for large vessel occlusion.
Bilateral Vertebral Artery atherosclerosis is stable from the CTA
last month, with Severe focal stenosis of the Left Vertebral V2
segment at the C4 level, and moderate left V4 plaque and stenosis.
But the Left Vertebral Artery remains patent. The Left PICA origin
remains patent and normal.

2. Other extra, and intracranial atherosclerosis appears stable
since last month, notable for:
- Right ICA bulb plaque with 60% stenosis
- Moderate stenosis right ICA siphon due to calcified plaque.
- Moderate stenosis left ACA distal A2 segment.
- mild left MCA M1 stenosis, and up to Moderate stenosis of a middle
Left M2 branch.
- mild to moderate stenosis at the right PCA bifurcation.

3. Chronic left upper lobe peribronchial nodularity, not
significantly changed from [DP] and most resembles impacted distal
airways and/or chronic post inflammatory change.

4. Aortic Atherosclerosis ([DP]-[DP]).

## 2021-05-13 MED ORDER — ACETAMINOPHEN 325 MG PO TABS
650.0000 mg | ORAL_TABLET | Freq: Four times a day (QID) | ORAL | Status: DC | PRN
  Administered 2021-05-14: 650 mg via ORAL
  Filled 2021-05-13: qty 2

## 2021-05-13 MED ORDER — ONDANSETRON HCL 4 MG PO TABS: 4.0000 mg | ORAL_TABLET | Freq: Four times a day (QID) | ORAL | Status: DC | PRN

## 2021-05-13 MED ORDER — IOHEXOL 350 MG/ML SOLN
100.0000 mL | Freq: Once | INTRAVENOUS | Status: AC | PRN
  Administered 2021-05-13: 100 mL via INTRAVENOUS

## 2021-05-13 MED ORDER — INSULIN ASPART 100 UNIT/ML IJ SOLN
0.0000 [IU] | Freq: Three times a day (TID) | INTRAMUSCULAR | Status: DC
  Administered 2021-05-13: 3 [IU] via SUBCUTANEOUS
  Administered 2021-05-13: 2 [IU] via SUBCUTANEOUS
  Administered 2021-05-15 (×3): 3 [IU] via SUBCUTANEOUS
  Administered 2021-05-16: 2 [IU] via SUBCUTANEOUS
  Administered 2021-05-16: 3 [IU] via SUBCUTANEOUS
  Administered 2021-05-16 – 2021-05-17 (×2): 2 [IU] via SUBCUTANEOUS
  Administered 2021-05-17: 3 [IU] via SUBCUTANEOUS
  Administered 2021-05-17: 5 [IU] via SUBCUTANEOUS
  Administered 2021-05-18: 8 [IU] via SUBCUTANEOUS
  Administered 2021-05-18: 2 [IU] via SUBCUTANEOUS
  Administered 2021-05-19: 5 [IU] via SUBCUTANEOUS
  Administered 2021-05-19: 8 [IU] via SUBCUTANEOUS
  Administered 2021-05-20: 3 [IU] via SUBCUTANEOUS
  Administered 2021-05-20 (×2): 5 [IU] via SUBCUTANEOUS
  Administered 2021-05-20 – 2021-05-21 (×2): 3 [IU] via SUBCUTANEOUS
  Administered 2021-05-21: 2 [IU] via SUBCUTANEOUS
  Administered 2021-05-22: 3 [IU] via SUBCUTANEOUS
  Administered 2021-05-22: 2 [IU] via SUBCUTANEOUS
  Administered 2021-05-22 – 2021-05-23 (×3): 3 [IU] via SUBCUTANEOUS
  Filled 2021-05-13: qty 0.15

## 2021-05-13 MED ORDER — ACETAMINOPHEN 650 MG RE SUPP: 650.0000 mg | Freq: Four times a day (QID) | RECTAL | Status: DC | PRN

## 2021-05-13 MED ORDER — INSULIN ASPART PROT & ASPART (70-30 MIX) 100 UNIT/ML ~~LOC~~ SUSP
15.0000 [IU] | Freq: Two times a day (BID) | SUBCUTANEOUS | Status: DC
  Administered 2021-05-14 – 2021-05-16 (×5): 15 [IU] via SUBCUTANEOUS
  Filled 2021-05-13 (×3): qty 10

## 2021-05-13 MED ORDER — LORAZEPAM 2 MG/ML IJ SOLN
0.5000 mg | Freq: Once | INTRAMUSCULAR | Status: DC
  Filled 2021-05-13: qty 1

## 2021-05-13 MED ORDER — ENALAPRIL MALEATE 5 MG PO TABS
10.0000 mg | ORAL_TABLET | Freq: Every day | ORAL | Status: DC
  Administered 2021-05-14 – 2021-05-23 (×7): 10 mg via ORAL
  Filled 2021-05-13 (×4): qty 2
  Filled 2021-05-13 (×2): qty 1
  Filled 2021-05-13 (×5): qty 2

## 2021-05-13 MED ORDER — LORAZEPAM 2 MG/ML IJ SOLN: 0.5000 mg | Freq: Once | INTRAMUSCULAR | Status: DC

## 2021-05-13 MED ORDER — LORAZEPAM 2 MG/ML IJ SOLN
0.5000 mg | Freq: Once | INTRAMUSCULAR | Status: AC
  Administered 2021-05-13: 0.5 mg via INTRAMUSCULAR

## 2021-05-13 MED ORDER — ALPRAZOLAM 0.5 MG PO TABS: 0.5000 mg | ORAL_TABLET | Freq: Once | ORAL | Status: DC

## 2021-05-13 MED ORDER — ATORVASTATIN CALCIUM 40 MG PO TABS
40.0000 mg | ORAL_TABLET | Freq: Every day | ORAL | Status: DC
  Administered 2021-05-14: 40 mg via ORAL
  Filled 2021-05-13: qty 4

## 2021-05-13 MED ORDER — LORAZEPAM 2 MG/ML IJ SOLN
INTRAMUSCULAR | Status: AC
  Administered 2021-05-13: 0.5 mg via INTRAVENOUS
  Filled 2021-05-13: qty 1

## 2021-05-13 MED ORDER — POLYETHYLENE GLYCOL 3350 17 G PO PACK
17.0000 g | PACK | Freq: Every day | ORAL | Status: DC | PRN
  Filled 2021-05-13: qty 1

## 2021-05-13 MED ORDER — AMLODIPINE BESYLATE 10 MG PO TABS
10.0000 mg | ORAL_TABLET | Freq: Every day | ORAL | Status: DC
  Administered 2021-05-14 – 2021-05-23 (×7): 10 mg via ORAL
  Filled 2021-05-13 (×2): qty 1
  Filled 2021-05-13: qty 2
  Filled 2021-05-13 (×4): qty 1
  Filled 2021-05-13 (×2): qty 2
  Filled 2021-05-13: qty 1

## 2021-05-13 MED ORDER — ONDANSETRON HCL 4 MG/2ML IJ SOLN
4.0000 mg | Freq: Four times a day (QID) | INTRAMUSCULAR | Status: DC | PRN
  Administered 2021-05-15 – 2021-05-20 (×2): 4 mg via INTRAVENOUS
  Filled 2021-05-13 (×2): qty 2

## 2021-05-13 MED ORDER — STROKE: EARLY STAGES OF RECOVERY BOOK
Freq: Once | Status: DC
  Filled 2021-05-13: qty 1

## 2021-05-13 MED ORDER — ENOXAPARIN SODIUM 40 MG/0.4ML IJ SOSY
40.0000 mg | PREFILLED_SYRINGE | INTRAMUSCULAR | Status: DC
  Administered 2021-05-14 – 2021-05-23 (×8): 40 mg via SUBCUTANEOUS
  Filled 2021-05-13 (×11): qty 0.4

## 2021-05-13 MED ORDER — LORAZEPAM 2 MG/ML IJ SOLN
0.5000 mg | INTRAMUSCULAR | Status: AC | PRN
  Administered 2021-05-14: 0.5 mg via INTRAVENOUS
  Filled 2021-05-13 (×3): qty 1

## 2021-05-13 MED ORDER — LORAZEPAM 2 MG/ML IJ SOLN: 0.5000 mg | Freq: Once | INTRAMUSCULAR | Status: AC

## 2021-05-13 MED ORDER — ASPIRIN EC 81 MG PO TBEC
81.0000 mg | DELAYED_RELEASE_TABLET | Freq: Every day | ORAL | Status: DC
  Administered 2021-05-16 – 2021-05-23 (×6): 81 mg via ORAL
  Filled 2021-05-13 (×10): qty 1

## 2021-05-13 NOTE — H&P (Addendum)
History and Physical    Victor Fox GMW:[id number] DOB: Jan 17, 1946 DOA: 05/12/2021  PCP: Patient, No Pcp Per (Inactive)  Patient coming from: [facility]   Chief Complaint:  Chief Complaint  Patient presents with   Laceration     HPI:    75 year old male with past medical history of diabetes mellitus type 2, hypertension, hyperlipidemia, coronary artery disease, diastolic congestive heart failure (Echo 04/2021 EF 60-65% with G1DD), moderate-severe aortic stenosis and diabetic polyneuropathy as well as multiple strokes in the past who presents to [facility] long hospital emergency department status post fall while at [facility].    Of note, patient was hospitalized at [facility] from 8/17 until 9/8.  Patient was found to have multiple small infarcts of the left medullary pyramid and superior right frontal lobe.  Patient was initiated on a 21-day course of aspirin and Plavix and a Zio patch was additionally initiated for evaluation for underlying arrhythmias.  Hospitalization was complicated by incidental COVID-19 positivity requiring patient to be conservatively managed for 10 days.  Patient was eventually discharged to [facility] skilled nursing facility on 9/8.  Patient explains that he got up to walk down the hall and while he was ambulating he suddenly felt a period of lightheadedness and generalized intense weakness leading to him falling to the floor.  Patient denies any actual loss of consciousness however.  Patient denies any focal weakness, slurring of his words, headaches, palpitations or chest pain.  EMS was contacted and the patient was promptly evaluated and brought into [facility] emergency department for evaluation.  Upon evaluation in the emergency department patient's laceration above the right eye was sutured.  CT imaging of the head was performed revealing a moderate hypodensity of the left cerebellum concerning for an acute/subacute  infarct that is certainly new compared to previous imaging performed during the last hospitalization.  Due to this finding the hospitalist group was then called to assess the patient for admission to the hospital.  Review of Systems:   Review of Systems  Musculoskeletal:  Positive for falls.   Past Medical History:  Diagnosis Date   Arthritis    Benign neoplasm of unspecified site    Carotid artery disease ([place])    a. carotid duplex 05/2015: less tahn 39% stenosis bilaterally, stable over the past year, recommend repeat imaging in 2 years (05/2017)   Compression fracture    a. L2   CVA (cerebral vascular accident) ([place]) 06/2012   Diabetes mellitus without complication ([place])    Heart murmur    a. echo 06/2012: EF >55%, LVH, aortic valve sclerosis   Hyperlipidemia    Hypertension    Neuropathy    Pneumonia    Tobacco abuse    a. smoked x 25 years   Vitamin D deficiency     Past Surgical History:  Procedure Laterality Date   ARTHRODESIS     BACK SURGERY     x 2   discectomy     trigger release       reports that he has quit smoking. His smoking use included cigarettes. He has a 10.00 pack-year smoking history. He has never used smokeless tobacco. He reports that he does not drink alcohol and does not use drugs.  No Known Allergies  Family History  Problem Relation Age of Onset   Heart disease Brother    Hypertension Mother      Prior to Admission medications   Medication Sig Start Date End  Date Taking? Authorizing Provider  amLODipine (NORVASC) 10 MG tablet Take 1 tablet (10 mg total) by mouth daily. Appointment needed please for further refills 10/20/19  Yes Samuella Cota, MD  aspirin EC 81 MG tablet Take 81 mg by mouth daily.   Yes [provider]  atorvastatin (LIPITOR) 40 MG tablet Take 1 tablet (40 mg total) by mouth at bedtime. 10/20/19  Yes Samuella Cota, MD  enalapril (VASOTEC) 10 MG tablet Take 1 tablet (10 mg total) by mouth daily. 05/10/21  06/09/21 Yes Wyvonnia Dusky, MD  Insulin Isophane & Regular Human (NOVOLIN 70/30 FLEXPEN) (70-30) 100 UNIT/ML PEN Inject 15 Units into the skin in the morning and at bedtime. 10/20/19  Yes Samuella Cota, MD  meclizine (ANTIVERT) 25 MG tablet Take 1 tablet (25 mg total) by mouth 3 (three) times daily as needed for dizziness. 01/08/20  Yes Arta Silence, MD  B-D UF III MINI PEN NEEDLES 31G X 5 MM MISC use once daily 01/28/17   Roselee Nova, MD  blood glucose meter kit and supplies KIT Dispense based on patient and insurance preference. Use up to four times daily as directed. (FOR ICD-9 250.00, 250.01). 11/23/18   Fredderick Severance, NP  ONE TOUCH ULTRA TEST test strip USE QID UTD 11/23/18   [provider]    Physical Exam: Vitals:   05/12/21 2355 05/13/21 0100 05/13/21 0310 05/13/21 0400  BP: (!) 154/72 (!) 150/63 (!) 152/71 (!) 162/66  Pulse: 68 64 60 63  Resp: 16 10 11 13   Temp:      TempSrc:      SpO2: 97% 97% 99% 99%    Constitutional: Awake alert and oriented x3, no associated distress.   Skin: no rashes, no lesions, poor skin turgor noted. Eyes: Notable laceration superior to the right eye status post repair.  Pupils are equally reactive to light.  No evidence of scleral icterus or conjunctival pallor.  ENMT: Moist mucous membranes noted.  Posterior pharynx clear of any exudate or lesions.   Neck: normal, supple, no masses, no thyromegaly.  No evidence of jugular venous distension.   Respiratory: clear to auscultation bilaterally, no wheezing, no crackles. Normal respiratory effort. No accessory muscle use.  Cardiovascular: 2 out of 6 systolic murmur noted,  No extremity edema. 2+ pedal pulses. No carotid bruits.  Chest:   Nontender without crepitus or deformity.   Back:   Nontender without crepitus or deformity. Abdomen: Abdomen is soft and nontender.  No evidence of intra-abdominal masses.  Positive bowel sounds noted in all quadrants.   Musculoskeletal:  No joint deformity upper and lower extremities. Good ROM, no contractures. Normal muscle tone.  Neurologic: CN 2-12 grossly intact. Sensation intact.  Patient moving all 4 extremities spontaneously.  Patient is following all commands.  Patient is responsive to verbal stimuli.   Psychiatric: Patient exhibits normal mood with appropriate affect.  Patient seems to possess insight as to their current situation.     Labs on Admission: I have personally reviewed following labs and imaging studies -   CBC: Recent Labs  Lab 05/09/21 0431 05/13/21 0032  WBC 8.1 13.3*  NEUTROABS  --  11.1*  HGB 13.3 15.9  HCT 38.0* 46.9  MCV 84.3 85.7  PLT 175 253   Basic Metabolic Panel: Recent Labs  Lab 05/09/21 0431 05/13/21 0032  NA 140 138  K 4.0 4.4  CL 101 98  CO2 32 27  GLUCOSE 124* 220*  BUN 11 18  CREATININE 0.79 0.84  CALCIUM 8.6* 9.3   GFR: Estimated Creatinine Clearance: 84.7 mL/min (by C-G formula based on SCr of 0.84 mg/dL). Liver Function Tests: Recent Labs  Lab 05/13/21 0032  AST 17  ALT 18  ALKPHOS 79  BILITOT 1.5*  PROT 6.9  ALBUMIN 3.8   No results for input(s): LIPASE, AMYLASE in the last 168 hours. No results for input(s): AMMONIA in the last 168 hours. Coagulation Profile: Recent Labs  Lab 05/13/21 0032  INR 1.1   Cardiac Enzymes: No results for input(s): CKTOTAL, CKMB, CKMBINDEX, TROPONINI in the last 168 hours. BNP (last 3 results) No results for input(s): PROBNP in the last 8760 hours. HbA1C: No results for input(s): HGBA1C in the last 72 hours. CBG: Recent Labs  Lab 05/08/21 1706 05/08/21 2135 05/09/21 0846 05/09/21 1235 05/12/21 2051  GLUCAP 135* 108* 119* 133* 145*   Lipid Profile: No results for input(s): CHOL, HDL, LDLCALC, TRIG, CHOLHDL, LDLDIRECT in the last 72 hours. Thyroid Function Tests: No results for input(s): TSH, T4TOTAL, FREET4, T3FREE, THYROIDAB in the last 72 hours. Anemia Panel: No results for input(s): VITAMINB12, FOLATE,  FERRITIN, TIBC, IRON, RETICCTPCT in the last 72 hours. Urine analysis:    Component Value Date/Time   COLORURINE YELLOW (A) 05/13/2021 0350   APPEARANCEUR CLEAR (A) 05/13/2021 0350   APPEARANCEUR Clear 05/02/2013 1443   LABSPEC >=1.030 05/13/2021 0350   LABSPEC 1.017 05/02/2013 1443   PHURINE 6.0 05/13/2021 0350   GLUCOSEU NEGATIVE 05/13/2021 0350   GLUCOSEU Negative 05/02/2013 1443   HGBUR NEGATIVE 05/13/2021 0350   BILIRUBINUR NEGATIVE 05/13/2021 0350   BILIRUBINUR Negative 05/02/2013 1443   KETONESUR 15 (A) 05/13/2021 0350   PROTEINUR NEGATIVE 05/13/2021 0350   NITRITE NEGATIVE 05/13/2021 0350   LEUKOCYTESUR NEGATIVE 05/13/2021 0350   LEUKOCYTESUR Negative 05/02/2013 1443    Radiological Exams on Admission - Personally Reviewed: CT HEAD WO CONTRAST (5MM)  Result Date: 05/12/2021 CLINICAL DATA:  Head trauma fall with laceration EXAM: CT HEAD WITHOUT CONTRAST TECHNIQUE: Contiguous axial images were obtained from the base of the skull through the vertex without intravenous contrast. COMPARISON:  CT 04/18/2021, MRI 04/17/2021 FINDINGS: Brain: Interval moderate hypodensity within the left cerebellum consistent with acute to subacute infarct. No hemorrhage. Atrophy and advanced chronic small vessel ischemic changes of the white matter. Stable ventricle size. Vascular: No hyperdense vessels. Vertebral and carotid vascular calcification Skull: Normal. Negative for fracture or focal lesion. Sinuses/Orbits: No acute finding. Other: Moderate right forehead hematoma IMPRESSION: 1. Interval development of moderate hypodensity within the left cerebellum, consistent with acute to subacute infarct. No acute intracranial hemorrhage is visualized. 2. Atrophy with advanced chronic small vessel ischemic changes of the white matter. 3. Moderate right forehead scalp hematoma Electronically Signed   By: Donavan Foil M.D.   On: 05/12/2021 23:39    EKG: Personally reviewed.  Rhythm is normal sinus rhythm with  heart rate of 65 bpm.  No dynamic ST segment changes appreciated.  Assessment/Plan Principal Problem:   Acute stroke due to occlusion of left posterior cerebral artery (HCC)  Evidence of a new subacute stroke based on review of this noncontrast CT imaging with Dr. Rory Percy with Neurology.  Stroke likely occurred sometime after patient's presentation with stroke on 8/17 at [facility]. Considering multiple strokes over short period of time there is significant concern for underlying intracardiac thrombus as a cause or possibly secondary to patient's moderate to severe aortic stenosis.   Monitoring patient on telemetry Continuing aspirin monotherapy (21 day period completed)  per neurology's recommendation Continue statin therapy No need for repeat TTE, hemoglobin A1c or lipid panel as these were just recently done. No need for permissive hypertension according to my discussion with neurology Serial neurologic checks Per neurology's recommendation, consulting cardiology for consideration of TEE to evaluate for cardioembolic source Will attempt to interrogate Zio patch device.  Active Problems:    Moderate severe aortic stenosis  Identified on echocardiogram 8/19 Aortic valve area measuring 1.2 cm Possibly contributing to patient's recurrent strokes Consulting cardiology for consideration of TEE  Essential hypertension  Neurology has given permission to resume all antihypertensives as permissive hypertension is not necessary.    Carotid artery disease ([place])  Patient is chest pain-free Patient is on aspirin and statin therapy    Type 2 diabetes mellitus with diabetic polyneuropathy, with long-term current use of insulin ([place])  Accu-Cheks before every meal and nightly with sliding scale insulin Continue patient's home regimen of NovoLog 70/30 Recent hemoglobin A1c performed on 8/18 reveals poorly controlled diabetes found to be 9.5%.    Chronic diastolic CHF (congestive heart failure)  (HCC)  No evidence of cardiogenic volume overload    Mixed diabetic hyperlipidemia associated with type 2 diabetes mellitus ([place])  Continue home regimen of statin therapy   Code Status:  Full code  code status decision has been confirmed with: patient Family Communication: Patient has asked that I not contact any contact numbers listed on his facesheet.  Status is: Inpatient  Remains inpatient appropriate because:Ongoing diagnostic testing needed not appropriate for outpatient work up, IV treatments appropriate due to intensity of illness or inability to take PO, and Inpatient level of care appropriate due to severity of illness  Dispo: The patient is from: Home              Anticipated d/c is to: Home              Patient currently is not medically stable to d/c.   Difficult to place patient No        Vernelle Emerald MD [facility] Pager [phone]  If 7PM-7AM, please contact night-coverage [email or web address] Use universal [place] password for that web site. If you do not have the password, please call the hospital operator.  05/13/2021, 4:36 AM

## 2021-05-13 NOTE — Consult Note (Signed)
Cardiology Consultation:   Patient ID: [name] MRN: [id number]; DOB: 08/03/1946  Admit date: 05/12/2021 Date of Consult: 05/13/2021  PCP:  Patient, No Pcp Per (Inactive)   [facility] Providers Cardiologist:  None   {  Patient Profile:   Victor Fox is a 75 y.o. male with a history of stroke in 06/2012 with residual left sided and speech deficits, bilateral carotid artery stenosis, hypertension, hyperlipidemia, type 2 diabetes mellitus with neuropathy, tobacco abuse who is being seen for the evaluation of severe aortic stenosis at the request of Dr. Cyd Silence.  History of Present Illness:   Victor Fox is a 75 year old male with the above history who is followed by Dr. Rockey Situ.  Patient recently admitted at [facility] from 04/17/2021 to 05/09/2021 for acute CVA after presenting with dizziness and gait disturbance.  He was found to have acute left medullary pyramid and superior right frontal lobe infarcts with chronic microhemorrhages.  Also tested positive for COVID.  Neurology recommended DAPT with Aspirin and Plavix for 21 days and then Aspirin monotherapy after thatand a Zio patch was applied prior to discharge to rule out atrial fibrillation.  Of note, Echo during admission showed normal LV function with moderate to severe AS.  He was discharged to SNF.  Patient presented back to the [facility] ED on 05/12/2021 after a mechanical fall and hitting his head.  He sustained a laceration to his forehead right eyebrow.  Head CT showed interval development of moderate hypodensity within the left cerebellum consistent with acute to subacute infarct but no intracranial hemorrhage as well as a moderate right forehead scalp hematoma.  Head/neck CTA was negative for large vessel occlusion but stable bilateral vertebral disease from CTA last month as well as moderate stenosis of right ICA and moderate stenosis of the left ACA.  He was readmitted due to evidence of new subacute stroke.  Cardiology was  consulted given moderate to severe AS noted on recent Echo and need for TEE.  At the time of this evaluation, patient resting in no acute distress. He is very agitated and is refusing care. Reportedly pulled out IVs and is refusing medications (Lovenox injections). He would not tell me is name or date of birth and refused to let me exam him. He is not fully oriented but denies any cardiac symptoms including chest pain, shortness of breath, palpitations, lightheadedness/dizziness, syncope. Unable to get any other history due to patient not cooperating.   Past Medical History:  Diagnosis Date   Arthritis    Benign neoplasm of unspecified site    Carotid artery disease ([place])    a. carotid duplex 05/2015: less tahn 39% stenosis bilaterally, stable over the past year, recommend repeat imaging in 2 years (05/2017)   Compression fracture    a. L2   CVA (cerebral vascular accident) ([place]) 06/2012   Diabetes mellitus without complication ([place])    Heart murmur    a. echo 06/2012: EF >55%, LVH, aortic valve sclerosis   Hyperlipidemia    Hypertension    Neuropathy    Pneumonia    Tobacco abuse    a. smoked x 25 years   Vitamin D deficiency     Past Surgical History:  Procedure Laterality Date   ARTHRODESIS     BACK SURGERY     x 2   discectomy     trigger release       Home Medications:  Prior to Admission medications   Medication Sig Start Date End Date Taking?  Authorizing Provider  amLODipine (NORVASC) 10 MG tablet Take 1 tablet (10 mg total) by mouth daily. Appointment needed please for further refills Patient taking differently: Take 10 mg by mouth daily. 10/20/19  Yes Samuella Cota, MD  aspirin EC 81 MG tablet Take 81 mg by mouth daily.   Yes [provider]  atorvastatin (LIPITOR) 40 MG tablet Take 1 tablet (40 mg total) by mouth at bedtime. 10/20/19  Yes Samuella Cota, MD  enalapril (VASOTEC) 10 MG tablet Take 1 tablet (10 mg total) by mouth daily. 05/10/21 06/09/21  Yes Wyvonnia Dusky, MD  Insulin Isophane & Regular Human (NOVOLIN 70/30 FLEXPEN) (70-30) 100 UNIT/ML PEN Inject 15 Units into the skin in the morning and at bedtime. 10/20/19  Yes Samuella Cota, MD  meclizine (ANTIVERT) 25 MG tablet Take 1 tablet (25 mg total) by mouth 3 (three) times daily as needed for dizziness. 01/08/20  Yes Arta Silence, MD  B-D UF III MINI PEN NEEDLES 31G X 5 MM MISC use once daily 01/28/17   Roselee Nova, MD  blood glucose meter kit and supplies KIT Dispense based on patient and insurance preference. Use up to four times daily as directed. (FOR ICD-9 250.00, 250.01). 11/23/18   Poulose, Bethel Born, NP  ONE TOUCH ULTRA TEST test strip USE QID UTD 11/23/18   [provider]    Inpatient Medications: Scheduled Meds:   stroke: mapping our early stages of recovery book   Does not apply Once   amLODipine  10 mg Oral Daily   aspirin EC  81 mg Oral Daily   atorvastatin  40 mg Oral QHS   enalapril  10 mg Oral Daily   enoxaparin (LOVENOX) injection  40 mg Subcutaneous Q24H   insulin aspart  0-15 Units Subcutaneous TID AC & HS   insulin aspart protamine- aspart  15 Units Subcutaneous BID AC   Continuous Infusions:  PRN Meds: acetaminophen **OR** acetaminophen, ondansetron **OR** ondansetron (ZOFRAN) IV, polyethylene glycol  Allergies:   No Known Allergies  Social History:   Social History   Socioeconomic History   Marital status: Single    Spouse name: Not on file   Number of children: 2   Years of education: Not on file   Highest education level: 12th grade  Occupational History    Comment: full time  Tobacco Use   Smoking status: Former    Packs/day: 1.00    Years: 10.00    Pack years: 10.00    Types: Cigarettes   Smokeless tobacco: Never  Vaping Use   Vaping Use: Never used  Substance and Sexual Activity   Alcohol use: No   Drug use: No   Sexual activity: Not Currently  Other Topics Concern   Not on file  Social History  Narrative   Not on file   Social Determinants of Health   Financial Resource Strain: Not on file  Food Insecurity: Not on file  Transportation Needs: Not on file  Physical Activity: Not on file  Stress: Not on file  Social Connections: Not on file  Intimate Partner Violence: Not on file    Family History:    Family History  Problem Relation Age of Onset   Heart disease Brother    Hypertension Mother      ROS:  Please see the history of present illness.  Review of Systems  Unable to perform ROS: Other (Patient somewhat disoriented and refusing to answer questions.)   Physical Exam/Data:  Vitals:   05/13/21 0530 05/13/21 0615 05/13/21 0815 05/13/21 0830  BP: (!) 171/73 (!) 165/67 (!) 169/69 (!) 171/81  Pulse: 60 62 60 61  Resp: _0 Temp:      TempSrc:      SpO2: 97% 99% 97% 92%   No intake or output data in the 24 hours ending 05/13/21 1107 Last 3 Weights 05/01/2021 04/17/2021 04/17/2021  Weight (lbs) 198 lb 4.8 oz 199 lb 15.3 oz 200 lb  Weight (kg) 89.948 kg 90.7 kg 90.719 kg     There is no height or weight on file to calculate BMI.   **Physical exam very limited as patient refused to let me examine him**  General: 75 y.o. male resting comfortably in no acute distress. Combative. HEENT: Laceration above right eyebrow that has been sutured. Heart: RRR on telemetry. Patient refused to let me ascultate. Lungs: No increased work of breathing. No audible wheezing. Patient refused to let me ausculate. Abdomen: Patient refused to let me examine. Extremities: Patient refused to let me examine. Skin: Patient refused to let me examine. Neuro: Alert but disoriented. No acute focal deficits. Psych: Combative. Very tangential. Difficult to redirect.   EKG:  The EKG was personally reviewed and demonstrates:  Normal sinus rhythm, rate 65 bpm, with elevated J point with upswooping St elevation in V2-V4 but does not meet criteria for STEMI. Telemetry:  Telemetry was  personally reviewed and demonstrates:  Normal sinus rhythm with rates in the 60s to 80s.  Relevant CV Studies:  Carotid Ultrasound 04/18/2021: Extensive bilateral atherosclerotic plaque similar to that seen on prior exams without focal hemodynamically significant stenosis. _______________  TTE 04/19/2021: Impressions: 1. Left ventricular ejection fraction, by estimation, is 60 to 65%. The  left ventricle has normal function. The left ventricle has no regional  wall motion abnormalities. There is moderate left ventricular hypertrophy.  Left ventricular diastolic  parameters are consistent with Grade I diastolic dysfunction (impaired  relaxation).   2. Right ventricular systolic function is normal. The right ventricular  size is normal.   3. Left atrial size was mildly dilated.   4. The aortic valve is normal in structure. There is severe calcifcation  of the aortic valve. Aortic valve regurgitation is mild. Moderate to  severe aortic valve stenosis. Aortic valve area, by VTI measures 1.20 cm.  Aortic valve mean gradient measures  28.5 mmHg. Aortic valve Vmax measures 3.41 m/s.  _______________  Head/Neck CTA 05/13/2021: Impressions: 1. Negative for large vessel occlusion. Bilateral Vertebral Artery atherosclerosis is stable from the CTA last month, with Severe focal stenosis of the Left Vertebral V2 segment at the C4 level, and moderate left V4 plaque and stenosis. But the Left Vertebral Artery remains patent. The Left PICA origin remains patent and normal.  2. Other extra, and intracranial atherosclerosis appears stable since last month, notable for: - Right ICA bulb plaque with 60% stenosis - Moderate stenosis right ICA siphon due to calcified plaque. - Moderate stenosis left ACA distal A2 segment. - mild left MCA M1 stenosis, and up to Moderate stenosis of a middle Left M2 branch. - mild to moderate stenosis at the right PCA bifurcation.  3. Chronic left upper lobe  peribronchial nodularity, not significantly changed from 2020 and most resembles impacted distal airways and/or chronic post inflammatory change.  4. Aortic Atherosclerosis (ICD10-I70.0).   Laboratory Data:  High Sensitivity Troponin:  No results for input(s): TROPONINIHS in the last 720 hours.   Chemistry Recent Labs  Lab  05/09/21 0431 05/13/21 0032 05/13/21 0436  NA 140 138 134*  K 4.0 4.4 4.1  CL 101 98 98  CO2 32 27 27  GLUCOSE 124* 220* 206*  BUN _0 CREATININE 0.79 0.84 0.81  CALCIUM 8.6* 9.3 8.9  GFRNONAA >60 >60 >60  ANIONGAP _1 Recent Labs  Lab 05/13/21 0032 05/13/21 0436  PROT 6.9 6.4*  ALBUMIN 3.8 3.4*  AST 17 16  ALT 18 17  ALKPHOS 79 71  BILITOT 1.5* 1.2   Hematology Recent Labs  Lab 05/09/21 0431 05/13/21 0032 05/13/21 0436  WBC 8.1 13.3* 11.1*  RBC 4.51 5.47 5.07  HGB 13.3 15.9 14.7  HCT 38.0* 46.9 43.9  MCV 84.3 85.7 86.6  MCH 29.5 29.1 29.0  MCHC 35.0 33.9 33.5  RDW 13.4 13.6 13.6  PLT 175 198 192   BNPNo results for input(s): BNP, PROBNP in the last 168 hours.  DDimer No results for input(s): DDIMER in the last 168 hours.   Radiology/Studies:  CT ANGIO HEAD W OR WO CONTRAST  Result Date: 05/13/2021 CLINICAL DATA:  75 year old male with neurologic deficit. Fall. Hypodense cerebellum on earlier plain head CT compatible with left PICA infarct. EXAM: CT ANGIOGRAPHY HEAD AND NECK TECHNIQUE: Multidetector CT imaging of the head and neck was performed using the standard protocol during bolus administration of intravenous contrast. Multiplanar CT image reconstructions and MIPs were obtained to evaluate the vascular anatomy. Carotid stenosis measurements (when applicable) are obtained utilizing NASCET criteria, using the distal internal carotid diameter as the denominator. CONTRAST:  160m OMNIPAQUE IOHEXOL 350 MG/ML SOLN COMPARISON:  Plain head CT 2316 hours last night. CTA head and neck 04/18/2021. CT chest 09/01/2018. FINDINGS: CTA  NECK Skeleton: Absent maxillary dentition. Cervical spine and craniocervical junction degeneration. C5-C6 ankylosis. No acute osseous abnormality identified. Upper chest: Chronic left upper lobe peribronchial nodularity extending to the apical pleura. This has not significantly changed since 2020 And most resembles impacted distal airways and/or chronic post inflammatory changes. Visible central pulmonary arteries are patent. No superior mediastinal lymphadenopathy. Other neck: Small volume retained secretions layering in the pharynx. Otherwise negative. Aortic arch: Calcified aortic atherosclerosis. Three vessel arch configuration. Right carotid system: Brachiocephalic artery plaque without stenosis. Mild proximal right CCA soft plaque without stenosis. Calcified plaque in the mid right CCA before the bifurcation without stenosis. Moderate calcified plaque at the right carotid bifurcation and continuing into the ICA bulb. Bulky calcified plaque just distal to the bulb with 60 % stenosis with respect to the distal vessel (series 5, image 106), stable. The right ICA remains patent to the skull base. Left carotid system: Left CCA soft and calcified plaque without stenosis before the bifurcation. Circumferential calcified plaque at the left ICA origin and bulb with less than 50 % stenosis with respect to the distal vessel is stable from last month. Vertebral arteries: Proximal right subclavian artery plaque is mild without stenosis. Calcified plaque at the right vertebral artery origin only results in mild stenosis on series 8, image 174. Right vertebral is mildly dominant and patent to the skull base without additional plaque or stenosis. Soft and calcified proximal left subclavian artery plaque without significant stenosis. Left vertebral artery origin is normal on series 8, image 174. Left V1 segment is tortuous and there is distal V1 calcified plaque although with only mild stenosis. The left vertebral is  codominant. In the left mid V2 segment at C4 there is stable high-grade stenosis (series 7, image 232) which  appears related to soft plaque. The vessel remains patent to the skull base with mild V3 segment plaque but no other extracranial stenosis. CTA HEAD Posterior circulation: Distal right vertebral artery is patent to the basilar with focal V4 segment plaque resulting in mild to moderate stenosis on series 7, image 148. The left V4 segment is irregular with a greater degree of calcified plaque and moderate stenosis but remains patent to the basilar. And the left PICA origin is patent on series 7, image 149 and appears stable. Patent basilar artery without stenosis. Patent SCA and PCA origins. Posterior communicating arteries are diminutive or absent. Left PCA branches are within normal limits. At the right PCA bifurcation there is mild to moderate stenosis but the distal right PCA branches remain patent (series 12, image 17). Anterior circulation: Both ICA siphons are patent. Calcified siphon plaque on the left results in mild stenosis. Normal left ophthalmic artery origin. Calcified plaque on the right results in mild to moderate stenosis at both the precavernous and proximal supraclinoid segments. Patent carotid termini, MCA and ACA origins. Diminutive or absent anterior communicating artery. Bilateral ACA branches are patent. There is moderate distal left A2 stenosis which is stable on series 12, image 22. Right MCA M1 segment and bifurcation are patent without stenosis. Right MCA branches are patent with mild irregularity. Left MCA M1 segment is irregular but with only mild stenosis. Left MCA bifurcation is patent also with mild stenosis. And there is moderate stenosis of a proximal middle M2 branch suspected on series 9, image 142. Left MCA branches are stable since last month. Venous sinuses: Early contrast timing but patent. Anatomic variants: None. Review of the MIP images confirms the above findings  IMPRESSION: 1. Negative for large vessel occlusion. Bilateral Vertebral Artery atherosclerosis is stable from the CTA last month, with Severe focal stenosis of the Left Vertebral V2 segment at the C4 level, and moderate left V4 plaque and stenosis. But the Left Vertebral Artery remains patent. The Left PICA origin remains patent and normal. 2. Other extra, and intracranial atherosclerosis appears stable since last month, notable for: - Right ICA bulb plaque with 60% stenosis - Moderate stenosis right ICA siphon due to calcified plaque. - Moderate stenosis left ACA distal A2 segment. - mild left MCA M1 stenosis, and up to Moderate stenosis of a middle Left M2 branch. - mild to moderate stenosis at the right PCA bifurcation. 3. Chronic left upper lobe peribronchial nodularity, not significantly changed from 2020 and most resembles impacted distal airways and/or chronic post inflammatory change. 4. Aortic Atherosclerosis (ICD10-I70.0). Electronically Signed   By: Genevie Ann M.D.   On: 05/13/2021 06:50   CT HEAD WO CONTRAST (5MM)  Result Date: 05/12/2021 CLINICAL DATA:  Head trauma fall with laceration EXAM: CT HEAD WITHOUT CONTRAST TECHNIQUE: Contiguous axial images were obtained from the base of the skull through the vertex without intravenous contrast. COMPARISON:  CT 04/18/2021, MRI 04/17/2021 FINDINGS: Brain: Interval moderate hypodensity within the left cerebellum consistent with acute to subacute infarct. No hemorrhage. Atrophy and advanced chronic small vessel ischemic changes of the white matter. Stable ventricle size. Vascular: No hyperdense vessels. Vertebral and carotid vascular calcification Skull: Normal. Negative for fracture or focal lesion. Sinuses/Orbits: No acute finding. Other: Moderate right forehead hematoma IMPRESSION: 1. Interval development of moderate hypodensity within the left cerebellum, consistent with acute to subacute infarct. No acute intracranial hemorrhage is visualized. 2. Atrophy  with advanced chronic small vessel ischemic changes of the white matter. 3. Moderate right  forehead scalp hematoma Electronically Signed   By: Donavan Foil M.D.   On: 05/12/2021 23:39   CT ANGIO NECK W OR WO CONTRAST  Result Date: 05/13/2021 CLINICAL DATA:  75 year old male with neurologic deficit. Fall. Hypodense cerebellum on earlier plain head CT compatible with left PICA infarct. EXAM: CT ANGIOGRAPHY HEAD AND NECK TECHNIQUE: Multidetector CT imaging of the head and neck was performed using the standard protocol during bolus administration of intravenous contrast. Multiplanar CT image reconstructions and MIPs were obtained to evaluate the vascular anatomy. Carotid stenosis measurements (when applicable) are obtained utilizing NASCET criteria, using the distal internal carotid diameter as the denominator. CONTRAST:  143m OMNIPAQUE IOHEXOL 350 MG/ML SOLN COMPARISON:  Plain head CT 2316 hours last night. CTA head and neck 04/18/2021. CT chest 09/01/2018. FINDINGS: CTA NECK Skeleton: Absent maxillary dentition. Cervical spine and craniocervical junction degeneration. C5-C6 ankylosis. No acute osseous abnormality identified. Upper chest: Chronic left upper lobe peribronchial nodularity extending to the apical pleura. This has not significantly changed since 2020 And most resembles impacted distal airways and/or chronic post inflammatory changes. Visible central pulmonary arteries are patent. No superior mediastinal lymphadenopathy. Other neck: Small volume retained secretions layering in the pharynx. Otherwise negative. Aortic arch: Calcified aortic atherosclerosis. Three vessel arch configuration. Right carotid system: Brachiocephalic artery plaque without stenosis. Mild proximal right CCA soft plaque without stenosis. Calcified plaque in the mid right CCA before the bifurcation without stenosis. Moderate calcified plaque at the right carotid bifurcation and continuing into the ICA bulb. Bulky calcified plaque  just distal to the bulb with 60 % stenosis with respect to the distal vessel (series 5, image 106), stable. The right ICA remains patent to the skull base. Left carotid system: Left CCA soft and calcified plaque without stenosis before the bifurcation. Circumferential calcified plaque at the left ICA origin and bulb with less than 50 % stenosis with respect to the distal vessel is stable from last month. Vertebral arteries: Proximal right subclavian artery plaque is mild without stenosis. Calcified plaque at the right vertebral artery origin only results in mild stenosis on series 8, image 174. Right vertebral is mildly dominant and patent to the skull base without additional plaque or stenosis. Soft and calcified proximal left subclavian artery plaque without significant stenosis. Left vertebral artery origin is normal on series 8, image 174. Left V1 segment is tortuous and there is distal V1 calcified plaque although with only mild stenosis. The left vertebral is codominant. In the left mid V2 segment at C4 there is stable high-grade stenosis (series 7, image 232) which appears related to soft plaque. The vessel remains patent to the skull base with mild V3 segment plaque but no other extracranial stenosis. CTA HEAD Posterior circulation: Distal right vertebral artery is patent to the basilar with focal V4 segment plaque resulting in mild to moderate stenosis on series 7, image 148. The left V4 segment is irregular with a greater degree of calcified plaque and moderate stenosis but remains patent to the basilar. And the left PICA origin is patent on series 7, image 149 and appears stable. Patent basilar artery without stenosis. Patent SCA and PCA origins. Posterior communicating arteries are diminutive or absent. Left PCA branches are within normal limits. At the right PCA bifurcation there is mild to moderate stenosis but the distal right PCA branches remain patent (series 12, image 17). Anterior circulation:  Both ICA siphons are patent. Calcified siphon plaque on the left results in mild stenosis. Normal left ophthalmic artery origin. Calcified  plaque on the right results in mild to moderate stenosis at both the precavernous and proximal supraclinoid segments. Patent carotid termini, MCA and ACA origins. Diminutive or absent anterior communicating artery. Bilateral ACA branches are patent. There is moderate distal left A2 stenosis which is stable on series 12, image 22. Right MCA M1 segment and bifurcation are patent without stenosis. Right MCA branches are patent with mild irregularity. Left MCA M1 segment is irregular but with only mild stenosis. Left MCA bifurcation is patent also with mild stenosis. And there is moderate stenosis of a proximal middle M2 branch suspected on series 9, image 142. Left MCA branches are stable since last month. Venous sinuses: Early contrast timing but patent. Anatomic variants: None. Review of the MIP images confirms the above findings IMPRESSION: 1. Negative for large vessel occlusion. Bilateral Vertebral Artery atherosclerosis is stable from the CTA last month, with Severe focal stenosis of the Left Vertebral V2 segment at the C4 level, and moderate left V4 plaque and stenosis. But the Left Vertebral Artery remains patent. The Left PICA origin remains patent and normal. 2. Other extra, and intracranial atherosclerosis appears stable since last month, notable for: - Right ICA bulb plaque with 60% stenosis - Moderate stenosis right ICA siphon due to calcified plaque. - Moderate stenosis left ACA distal A2 segment. - mild left MCA M1 stenosis, and up to Moderate stenosis of a middle Left M2 branch. - mild to moderate stenosis at the right PCA bifurcation. 3. Chronic left upper lobe peribronchial nodularity, not significantly changed from 2020 and most resembles impacted distal airways and/or chronic post inflammatory change. 4. Aortic Atherosclerosis (ICD10-I70.0). Electronically Signed    By: Genevie Ann M.D.   On: 05/13/2021 06:50     Assessment and Plan:   Multiple CVA - Recently admitted last month at [facility] for acute stroke.  Readmitted on 911/22 for evidence of new acute to subacute stroke. - No evidence of atrial fibrillation on telemetry so far.  We will continue to monitor. Zio monitor was removed for MRI. Will need to be mailed back to company. - Originally plan was for DAPT with Aspirin and Plavix for 21 days after initial stroke on 04/17/2021 and then Aspirin monotherapy.  - TEE tentatively planned for 05/16/2021. However, patient currently refusing care. - Otherwise, management per neurology.  Moderate to Severe Aortic Stenosis - Noted on Echo on 04/19/2021. - Will evaluate further on TEE later this week if patient allows this.  Carotid Artery Stenosis  - Neck CTA showed moderate carotid disease bilaterally. See full report above. - Continue antiplatelet therapy as above. - Continue statin.  Hypertension - BP elevated with systolic BP as high as the 170s. Per Neurology, no need for permissive hypertension at this point as stroke is likely greater than 30 days old.  - Patient currently listed NPO and refusing medications. Restart home medications when able.  Hyperlipidemia - Continue home Lipitor 49m daily.  Type 2 Diabetes Mellitus - Management per primary team.   Risk Assessment/Risk Scores:    N/A    For questions or updates, please contact [place] [email or web address] for contact info under    Signed, CDarreld Mclean PA-C  05/13/2021 11:07 AM

## 2021-05-13 NOTE — Progress Notes (Signed)
    OVERNIGHT PROGRESS REPORT  Patient is calm at this point and resting, refusing some meds but otherwise compliant with care.  Gershon Cull MSNA MSN ACNPC-AG Acute Care Nurse Practitioner [place]

## 2021-05-13 NOTE — ED Notes (Signed)
Pt returned from MRI after refusing the scan.

## 2021-05-13 NOTE — ED Notes (Signed)
House coverage paged J. Olena Heckle at [phone] for a call back to [facility]

## 2021-05-13 NOTE — ED Notes (Addendum)
Pt found climbing out of bed naked. Pt kicking staff in the head and biting hands. Posey belt reapplied. Pt screaming. Pt had removed IV from earlier. Refusing all cardiac leads. No safety sitter available.

## 2021-05-13 NOTE — ED Notes (Signed)
Sent amion chat about orders for Zio patch removal

## 2021-05-13 NOTE — Progress Notes (Signed)
Patient admitted to [facility] for stroke work up.waiting for bed.he was admitted today.he refused mri.chart reviewed.dw neurology.

## 2021-05-13 NOTE — ED Notes (Signed)
Pt agitated and pulling off EKG leads and pulse ox. Pt also refusing all medications "stating go give them to someone else, I don't need it" Mittens applied for pt safety.

## 2021-05-13 NOTE — ED Notes (Signed)
ZIO patch was removed for MRI. At bedside.

## 2021-05-13 NOTE — ED Notes (Signed)
Pt continues to be agitated, hitting and kicking at staff. Pt refused to keep the cardiac monitor, blood pressure cuff and pulse ox on. Pt placed in restraints for pt safety. Bed alarm in place. MD aware.

## 2021-05-13 NOTE — ED Notes (Signed)
Neurologist in room with pt at this time

## 2021-05-13 NOTE — ED Notes (Signed)
Pt continues to refuse his vital signs to be taken.

## 2021-05-13 NOTE — ED Notes (Signed)
Pt transported to MRI 

## 2021-05-13 NOTE — ED Provider Notes (Signed)
Care assumed from Dr. Matilde Sprang.  Patient with a fall sustaining a head laceration.  Awaiting CT scan.  CT negative for traumatic injury but does show acute cerebellar infarct.  Patient states he has been feeling off balance for a "while". Chart review shows patient was admitted to [facility] recently and discharged 4 days ago on aspirin and Plavix after having a right frontal and left medullary stroke. MRI from August 17 shows acute infarct in left medullary pyramid and superior right frontal lobe with numerous chronic microhemorrhages.  No ataxia on finger-to-nose.  Does have a right-sided facial droop unsure of chronicity.  We will plan admission for stroke work-up.  Appears to have new stroke since recent admission.  Discussed with Dr. Cyd Silence.   Ezequiel Essex, MD 05/13/21 [phone]

## 2021-05-13 NOTE — ED Notes (Addendum)
Pt become aggressive during shift change. Pt started kicking, hitting, and trying to bite staff. Pt was saying he will continue fighting and trying to bite staff until he goes home. Nurse and charge nurse are aware.

## 2021-05-13 NOTE — ED Notes (Signed)
Due to pt being uncooperative, unable to obtain vital signs at this time.

## 2021-05-13 NOTE — Consult Note (Addendum)
Neurology Consultation  Reason for Consult: stroke Referring Physician: Dr Cyd Silence  CC: Frequent falls, concern for new stroke versus stroke expansion on recent stroke  History is obtained from: Patient, chart  HPI: Victor Fox is a 75 y.o. male past medical history of recent acute ischemic stroke involving the right frontal lobe and left medulla on 04/18/2021, with concomitant incidental COVID-19 infection, hypertension, hyperlipidemia, moderate to severe aortic stenosis, diabetes, hypertension, hyperlipidemia, tobacco abuse, discharged to a SNF few days ago from [facility] on dual antiplatelets and Zio patch after stroke work-up was completed-presented to [facility] from SNF after he sustained a fall and hit his head on on the ground.  He has been complaining of frequent falls ever since his stroke but this fall was one where he had hit his head and had a head laceration, and came to the hospital for further evaluation.  Reports of generalized weakness when he attempted to get up on multiple occasions. No chest pain shortness of breath.  No nausea vomiting.  Lightheadedness and dizziness positive.  Reports significant gait instability.  Noncontrast head CT was done to rule out traumatic injury or bleed.  Head CT did not reveal any bleeding but did show a new reasonable sized hypodensity concerning for a new cerebellar infarct   Work-up at [facility] hospital during the hospitalization of August 2022: MRI of the brain showed small acute infarct of the left medullary pyramid and superior right frontal lobe.  No acute hemorrhage or mass-effect.  It also showed numerous chronic microhemorrhages predominantly central-likely indicative of hypertensive angiopathy.  Microhemorrhage also seen in the left cerebellar hemisphere.     -- CT angiography head and neck with no emergent LVO.  Plaque along the proximal internal carotids causing less than 50% stenosis.   Posterior circulation with mild stenosis due to calcified plaque in the intracranial vertebral arteries but basilar and branches thereof patent. -- 2D echo LVEF 60 to 33%, grade 1 diastolic dysfunction, left atrium moderately dilated, aortic valve normal in structure but severe calcification causing moderate to severe stenosis. -- TEE not performed -- Telemetry: Question of abnormal heart rhythm but no definitive atrial fibrillation.  Discharged with 30-day outpatient cardiac monitoring. Telemetry review note by cardiology 04/30/2021-sinus rhythm. -- A1c-9.5 LDL 103. --  LKW: Unclear tpa given?: no, unclear last known well, established hypodensity, recent stroke Premorbid modified Rankin scale (mRS): 3 at least   ROS: Full ROS was performed and is negative except as noted in the HPI.  Past Medical History:  Diagnosis Date   Arthritis    Benign neoplasm of unspecified site    Carotid artery disease ([place])    a. carotid duplex 05/2015: less tahn 39% stenosis bilaterally, stable over the past year, recommend repeat imaging in 2 years (05/2017)   Compression fracture    a. L2   CVA (cerebral vascular accident) ([place]) 06/2012   Diabetes mellitus without complication (HCC)    Heart murmur    a. echo 06/2012: EF >55%, LVH, aortic valve sclerosis   Hyperlipidemia    Hypertension    Neuropathy    Pneumonia    Tobacco abuse    a. smoked x 25 years   Vitamin D deficiency     Family History  Problem Relation Age of Onset   Heart disease Brother    Hypertension Mother    Social History:   reports that he has quit smoking. His smoking use included cigarettes. He has a 10.00 pack-year smoking  history. He has never used smokeless tobacco. He reports that he does not drink alcohol and does not use drugs.  Medications  Current Facility-Administered Medications:     stroke: mapping our early stages of recovery book, , Does not apply, Once, Shalhoub, Sherryll Burger, MD   acetaminophen (TYLENOL)  tablet 650 mg, 650 mg, Oral, Q6H PRN **OR** acetaminophen (TYLENOL) suppository 650 mg, 650 mg, Rectal, Q6H PRN, Shalhoub, Sherryll Burger, MD   amLODipine (NORVASC) tablet 10 mg, 10 mg, Oral, Daily, Shalhoub, Sherryll Burger, MD   aspirin EC tablet 81 mg, 81 mg, Oral, Daily, Shalhoub, Sherryll Burger, MD   atorvastatin (LIPITOR) tablet 40 mg, 40 mg, Oral, QHS, Shalhoub, Sherryll Burger, MD   enalapril (VASOTEC) tablet 10 mg, 10 mg, Oral, Daily, Shalhoub, Sherryll Burger, MD   enoxaparin (LOVENOX) injection 40 mg, 40 mg, Subcutaneous, Q24H, Shalhoub, Sherryll Burger, MD   insulin aspart (novoLOG) injection 0-15 Units, 0-15 Units, Subcutaneous, TID AC & HS, Shalhoub, Sherryll Burger, MD   insulin isophane & regular human KwikPen (HUMULIN 70/30 MIX) (70-30) 100 UNIT/ML KwikPen 15 Units, 15 Units, Subcutaneous, BID AC, Shalhoub, Sherryll Burger, MD   ondansetron (ZOFRAN) tablet 4 mg, 4 mg, Oral, Q6H PRN **OR** ondansetron (ZOFRAN) injection 4 mg, 4 mg, Intravenous, Q6H PRN, Shalhoub, Sherryll Burger, MD   polyethylene glycol (MIRALAX / GLYCOLAX) packet 17 g, 17 g, Oral, Daily PRN, Shalhoub, Sherryll Burger, MD  Current Outpatient Medications:    amLODipine (NORVASC) 10 MG tablet, Take 1 tablet (10 mg total) by mouth daily. Appointment needed please for further refills, Disp: 30 tablet, Rfl: 0   aspirin EC 81 MG tablet, Take 81 mg by mouth daily., Disp: , Rfl:    atorvastatin (LIPITOR) 40 MG tablet, Take 1 tablet (40 mg total) by mouth at bedtime., Disp: 30 tablet, Rfl: 0   enalapril (VASOTEC) 10 MG tablet, Take 1 tablet (10 mg total) by mouth daily., Disp: 30 tablet, Rfl: 0   Insulin Isophane & Regular Human (NOVOLIN 70/30 FLEXPEN) (70-30) 100 UNIT/ML PEN, Inject 15 Units into the skin in the morning and at bedtime., Disp: 15 mL, Rfl: 11   meclizine (ANTIVERT) 25 MG tablet, Take 1 tablet (25 mg total) by mouth 3 (three) times daily as needed for dizziness., Disp: 15 tablet, Rfl: 0   B-D UF III MINI PEN NEEDLES 31G X 5 MM MISC, use once daily, Disp: 100 each, Rfl: PRN    blood glucose meter kit and supplies KIT, Dispense based on patient and insurance preference. Use up to four times daily as directed. (FOR ICD-9 250.00, 250.01)., Disp: 1 each, Rfl: 0   ONE TOUCH ULTRA TEST test strip, USE QID UTD, Disp: , Rfl:    Exam: Current vital signs: BP (!) 162/66   Pulse 63   Temp 98.3 F (36.8 C) (Oral)   Resp 13   SpO2 99%  Vital signs in last 24 hours: Temp:  [98.3 F (36.8 C)] 98.3 F (36.8 C) (09/11 2054) Pulse Rate:  [60-77] 63 (09/12 0400) Resp:  [10-18] 13 (09/12 0400) BP: (150-162)/(63-72) 162/66 (09/12 0400) SpO2:  [95 %-99 %] 99 % (09/12 0400) General: Awake alert in no distress HEENT: Normocephalic, right forehead laceration CVS: Regular rate rhythm Respiratory: Breathing well saturating normally on room air Extremities warm well perfused Neurological exam Awake alert oriented to self, the month, but not the year.  Could not tell me the name of the president. Mild dysarthria No evidence of gross aphasia noted Cranial nerve examination: Pupils equal  round react light, extraocular movement intact, visual fields full, facial sensation intact, face symmetric, tongue and palate midline. Motor examination with antigravity 5/5 in all fours. Sensation intact light touch without extinction Coordination: No dysmetria by finger-nose-finger testing or heel-knee-shin testing bilaterally. Gait testing deferred at this time NIH stroke scale 1a Level of Conscious.: 0 1b LOC Questions: 2 1c LOC Commands: 0 2 Best Gaze: 0 3 Visual: 0 4 Facial Palsy:  5a Motor Arm - left: 0 5b Motor Arm - Right: 0 6a Motor Leg - Left: 0 6b Motor Leg - Right: 0 7 Limb Ataxia: 0 8 Sensory: 0 9 Best Language: 0 10 Dysarthria: 1 11 Extinct. and Inatten.: 0 TOTAL: 3  Labs I have reviewed labs in epic and the results pertinent to this consultation are:  CBC    Component Value Date/Time   WBC 13.3 (H) 05/13/2021 0032   RBC 5.47 05/13/2021 0032   HGB 15.9  05/13/2021 0032   HGB 15.0 05/02/2013 1308   HCT 46.9 05/13/2021 0032   HCT 43.3 05/02/2013 1308   PLT 198 05/13/2021 0032   PLT 213 05/02/2013 1308   MCV 85.7 05/13/2021 0032   MCV 83 05/02/2013 1308   MCH 29.1 05/13/2021 0032   MCHC 33.9 05/13/2021 0032   RDW 13.6 05/13/2021 0032   RDW 13.4 05/02/2013 1308   LYMPHSABS 1.5 05/13/2021 0032   LYMPHSABS 2.9 06/30/2012 0405   MONOABS 0.6 05/13/2021 0032   MONOABS 0.5 06/30/2012 0405   EOSABS 0.0 05/13/2021 0032   EOSABS 0.3 06/30/2012 0405   BASOSABS 0.0 05/13/2021 0032   BASOSABS 0.0 06/30/2012 0405    CMP     Component Value Date/Time   NA 138 05/13/2021 0032   NA 139 07/09/2015 1504   NA 138 05/02/2013 1308   K 4.4 05/13/2021 0032   K 4.1 05/02/2013 1308   CL 98 05/13/2021 0032   CL 102 05/02/2013 1308   CO2 27 05/13/2021 0032   CO2 31 05/02/2013 1308   GLUCOSE 220 (H) 05/13/2021 0032   GLUCOSE 211 (H) 05/02/2013 1308   BUN 18 05/13/2021 0032   BUN 10 07/09/2015 1504   BUN 16 05/02/2013 1308   CREATININE 0.84 05/13/2021 0032   CREATININE 1.00 10/08/2018 0845   CALCIUM 9.3 05/13/2021 0032   CALCIUM 9.4 05/02/2013 1308   PROT 6.9 05/13/2021 0032   PROT 7.0 05/02/2013 1308   ALBUMIN 3.8 05/13/2021 0032   ALBUMIN 3.8 05/02/2013 1308   AST 17 05/13/2021 0032   AST 20 05/02/2013 1308   ALT 18 05/13/2021 0032   ALT 22 05/02/2013 1308   ALKPHOS 79 05/13/2021 0032   ALKPHOS 95 05/02/2013 1308   BILITOT 1.5 (H) 05/13/2021 0032   BILITOT 0.7 05/02/2013 1308   GFRNONAA >60 05/13/2021 0032   GFRNONAA 75 10/08/2018 0845   GFRAA >60 01/08/2020 1004   GFRAA 87 10/08/2018 0845    Lipid Panel     Component Value Date/Time   CHOL 162 04/19/2021 0326   CHOL 212 (H) 06/30/2012 0405   TRIG 116 04/19/2021 0326   TRIG 359 (H) 06/30/2012 0405   HDL 36 (L) 04/19/2021 0326   HDL 30 (L) 06/30/2012 0405   CHOLHDL 4.5 04/19/2021 0326   VLDL 23 04/19/2021 0326   VLDL 72 (H) 06/30/2012 0405   LDLCALC 103 (H) 04/19/2021 0326    LDLCALC 102 (H) 10/08/2018 0845   LDLCALC 110 (H) 06/30/2012 0405     Imaging I have reviewed the images obtained:  CT-head today-left cerebellar hypodensity new from prior imaging.   MRI examination of the brain-April 17, 2021-small acute infarcts in the left medullary pyramid and superior right frontal lobe.  No acute hemorrhage or mass-effect.  Notably-numerous chronic microhemorrhages.  Assessment: 75 year old man with above past medical history with recent brainstem and concomitant  small right frontal infarct-Concomitant small vessel disease versus embolic-etiology unknown-still cryptogenic stroke, discharged home on dual antiplatelets for 21 days and outpatient 30-day Zio patch comes back in for evaluation after having a fall at the skilled nursing facility and CT head revealing a moderate size left cerebellar hypodensity concerning for a subacute stroke. The stroke was not visualized on the CT and MRI scans as well as a CT angiography that was done at [facility] on August 17 18th of 2022. Had a question of abnormal heart rhythm but cardiology review was consistent with sinus rhythm. Given numerous chronic microhemorrhages, was gingerly started on dual antiplatelets for 21 days, which she should have completed. The cerebellar stroke either happened very soon after the brainstem/medullary infarct and probably went undetected because he was also COVID-positive at that time or is something that has happened sometime after that till now. Stroke etiology remains cryptogenic Admitted for further work-up-might need TEE.   Cardiology should also be engaged due to severe aortic stenosis which might be the etiology of the strokes.  Recommendations: Admit to hospitalist He has completed probably is 3 weeks of dual antiplatelet-we will keep him on aspirin only for now. High-dose statin Do not see a need to repeat lipid panel or A1c. MRI brain without contrast CTA head and  neck No need for permissive hypertension-the stroke is likely much older than a day or 2. I would recommend obtaining a cardiology consultation as well as transesophageal echocardiogram. Regarding anticoagulation: If he has a LV thrombus or atrial fibrillation that requires anticoagulation, that would be a discussion that will have to be had with the patient/family given his pretty severe chronic microhemorrhages-he is going to be at high risk for cerebral hemorrhage with anticoagulation.  The risk-benefit talk and discussion has to happen after the work-up completion and pinpointing etiology.  Discussed my plan with Dr. Cyd Silence over the phone and Dr. Wyvonnia Dusky, [place] at [facility] long emergency room. Patient is being transferred to [facility] team will follow.  -- Amie Portland, MD Neurologist [facility] Pager: [phone]

## 2021-05-13 NOTE — ED Notes (Signed)
MRI states that they need a doctor's order for Zio patch removal. Will notify Dr. And contact MRI when an order is in

## 2021-05-14 ENCOUNTER — Inpatient Hospital Stay (HOSPITAL_COMMUNITY): Payer: Medicare Other

## 2021-05-14 DIAGNOSIS — I63532 Cerebral infarction due to unspecified occlusion or stenosis of left posterior cerebral artery: Secondary | ICD-10-CM | POA: Diagnosis not present

## 2021-05-14 LAB — COMPREHENSIVE METABOLIC PANEL
ALT: 18 U/L (ref 0–44)
AST: 31 U/L (ref 15–41)
Albumin: 3.7 g/dL (ref 3.5–5.0)
Alkaline Phosphatase: 74 U/L (ref 38–126)
Anion gap: 12 (ref 5–15)
BUN: 13 mg/dL (ref 8–23)
CO2: 27 mmol/L (ref 22–32)
Calcium: 8.9 mg/dL (ref 8.9–10.3)
Chloride: 99 mmol/L (ref 98–111)
Creatinine, Ser: 0.64 mg/dL (ref 0.61–1.24)
GFR, Estimated: >60 mL/min (ref >60)
Glucose, Bld: 109 mg/dL — ABNORMAL HIGH (ref 70–99)
Potassium: 3.4 mmol/L — ABNORMAL LOW (ref 3.5–5.1)
Sodium: 138 mmol/L (ref 135–145)
Total Bilirubin: 1.7 mg/dL — ABNORMAL HIGH (ref 0.3–1.2)
Total Protein: 6.8 g/dL (ref 6.5–8.1)

## 2021-05-14 LAB — CBG MONITORING, ED
Glucose-Capillary: 107 mg/dL — ABNORMAL HIGH (ref 70–99)
Glucose-Capillary: 146 mg/dL — ABNORMAL HIGH (ref 70–99)
Glucose-Capillary: 78 mg/dL (ref 70–99)

## 2021-05-14 LAB — CBC WITH DIFFERENTIAL/PLATELET
Abs Immature Granulocytes: 0.02 10*3/uL (ref 0.00–0.07)
Basophils Absolute: 0 10*3/uL (ref 0.0–0.1)
Basophils Relative: 0 %
Eosinophils Absolute: 0.1 10*3/uL (ref 0.0–0.5)
Eosinophils Relative: 1 %
HCT: 43.6 % (ref 39.0–52.0)
Hemoglobin: 15.1 g/dL (ref 13.0–17.0)
Immature Granulocytes: 0 %
Lymphocytes Relative: 25 %
Lymphs Abs: 2.5 10*3/uL (ref 0.7–4.0)
MCH: 29.3 pg (ref 26.0–34.0)
MCHC: 34.6 g/dL (ref 30.0–36.0)
MCV: 84.5 fL (ref 80.0–100.0)
Monocytes Absolute: 0.9 10*3/uL (ref 0.1–1.0)
Monocytes Relative: 9 %
Neutro Abs: 6.6 10*3/uL (ref 1.7–7.7)
Neutrophils Relative %: 65 %
Platelets: 176 10*3/uL (ref 150–400)
RBC: 5.16 MIL/uL (ref 4.22–5.81)
RDW: 13.6 % (ref 11.5–15.5)
WBC: 10.2 10*3/uL (ref 4.0–10.5)
nRBC: 0 % (ref 0.0–0.2)

## 2021-05-14 LAB — GLUCOSE, CAPILLARY: Glucose-Capillary: 94 mg/dL (ref 70–99)

## 2021-05-14 LAB — MAGNESIUM: Magnesium: 2.4 mg/dL (ref 1.7–2.4)

## 2021-05-14 LAB — BRAIN NATRIURETIC PEPTIDE: B Natriuretic Peptide: 70.1 pg/mL (ref 0.0–100.0)

## 2021-05-14 IMAGING — MR MR HEAD W/O CM
10 series · 42 of 48 positions shown · non-contrast
Comparison: CT and CTA from yesterday

CLINICAL DATA: Neuro deficit with acute stroke suspected

EXAM:
MRI HEAD WITHOUT CONTRAST
TECHNIQUE: Multiplanar, multiecho pulse sequences of the brain and surrounding
structures were obtained without intravenous contrast.

[Series 5: dwi_tracew · axial · 3.0mm · 1.08mm/px · z∈[-4,+129]mm · 8 of 102 slices shown]
[im 1/102]
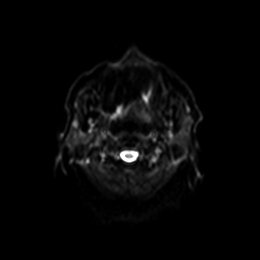
[im 19/102]
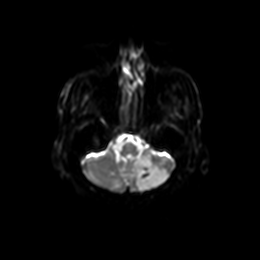
[im 28/102]
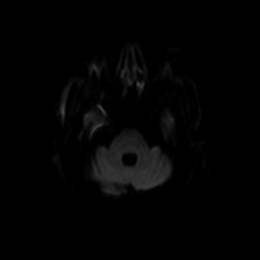
[im 46/102]
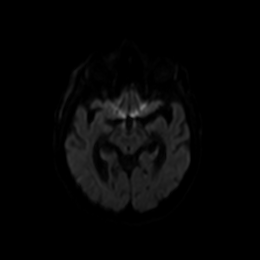
[im 56/102]
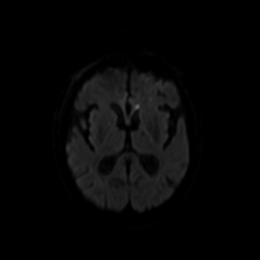
[im 74/102]
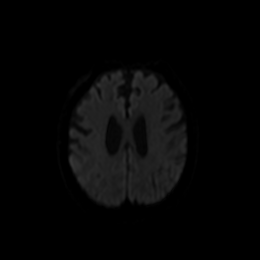
[im 83/102]
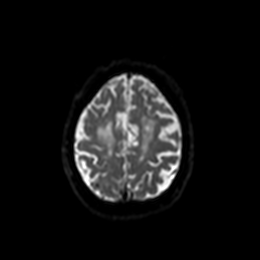
[im 102/102]
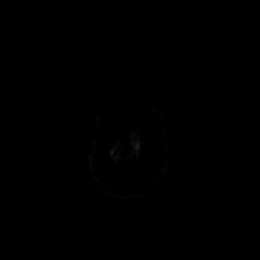

[Series 6: dwi_adc · axial · 3.0mm · 1.08mm/px · z∈[-4,+62]mm · 3 of 51 slices shown]
[im 1/51]
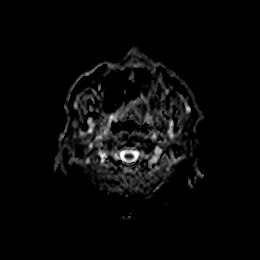
[im 13/51]
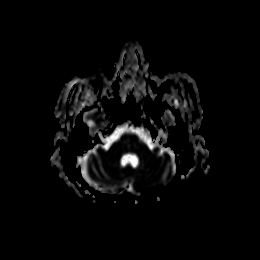
[im 26/51]
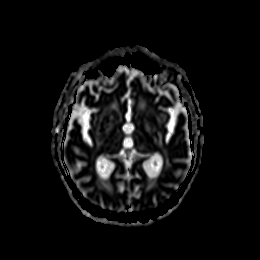

[Series 7: T2 · sagittal · 5.0mm · 0.47mm/px · 2 of 24 slices shown (1 of 3)]
[im 1/24]
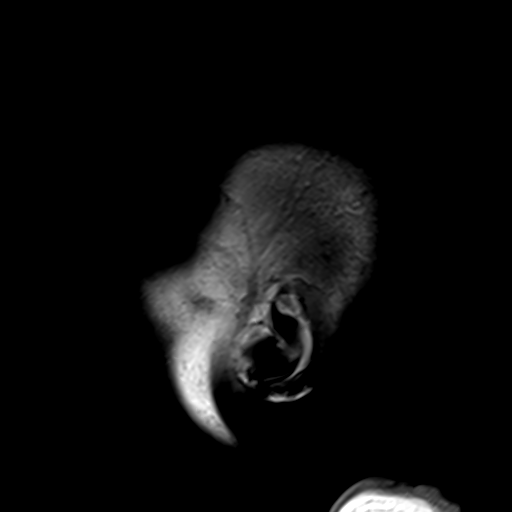
[im 24/24]
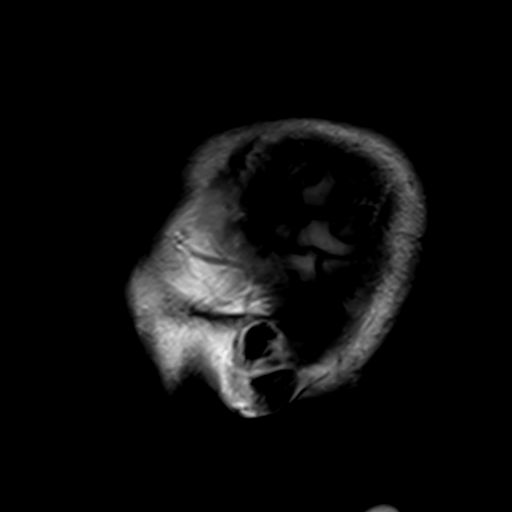

[Series 8: GRE · axial · 3.0mm · 0.45mm/px · z∈[-22,+112]mm · 5 of 51 slices shown]
[im 1/51]
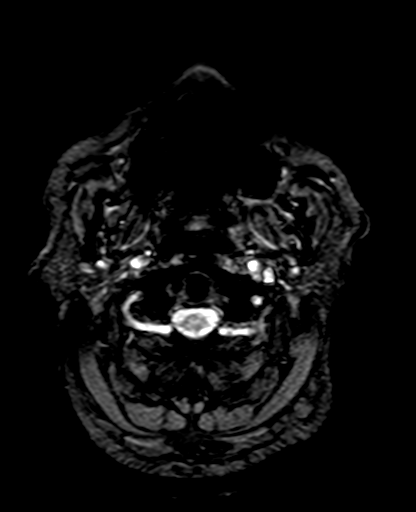
[im 13/51]
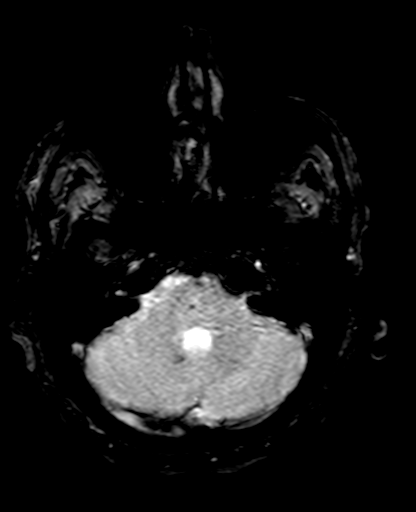
[im 26/51]
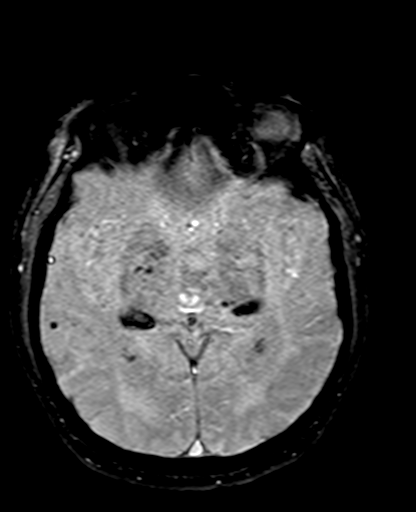
[im 38/51]
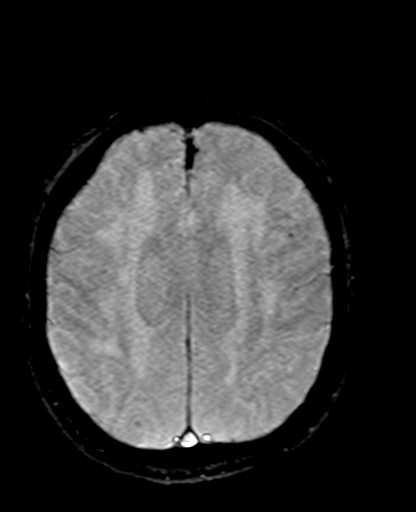
[im 51/51]
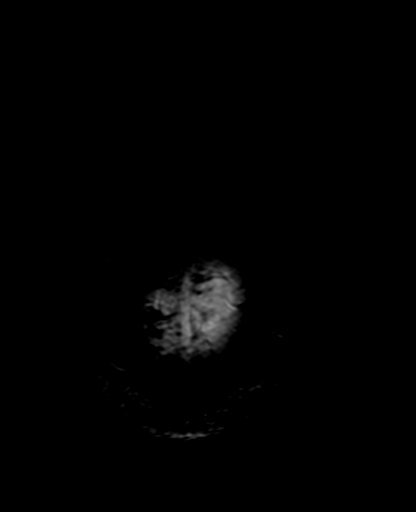

[Series 9: DWI · coronal · 5.0mm · 1.31mm/px · 6 of 56 slices shown (1 of 2)]
[im 1/56]
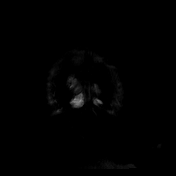
[im 12/56]
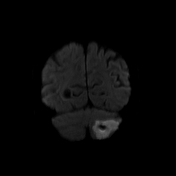
[im 23/56]
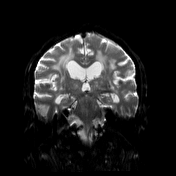
[im 34/56]
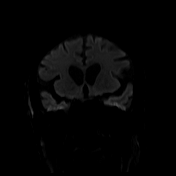
[im 45/56]
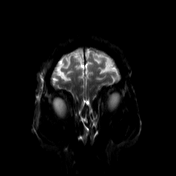
[im 56/56]
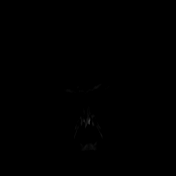

[Series 10: DWI · coronal · 5.0mm · 1.31mm/px · 3 of 28 slices shown (2 of 2)]
[im 1/28]
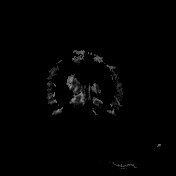
[im 14/28]
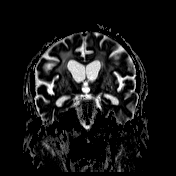
[im 28/28]
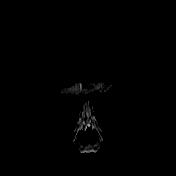

[Series 11: T2 · axial · 5.0mm · 0.45mm/px · z∈[-22,+116]mm · 3 of 25 slices shown (2 of 3)]
[im 1/25]
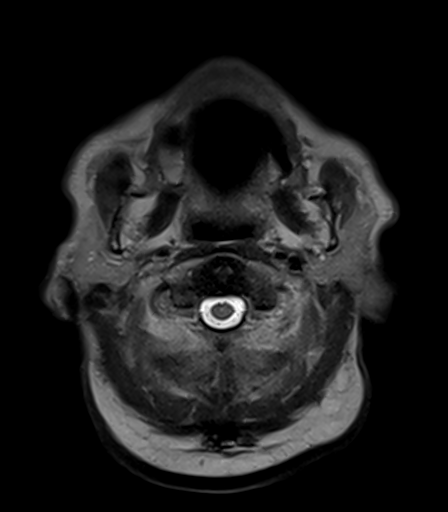
[im 13/25]
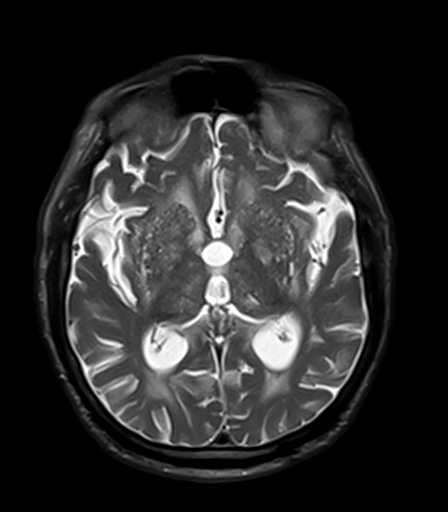
[im 25/25]
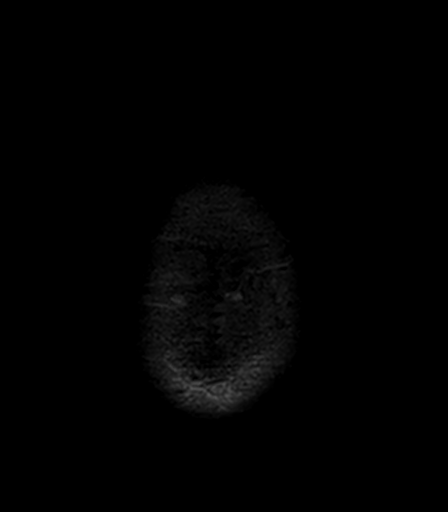

[Series 12: FLAIR · axial · 3.0mm · 0.90mm/px · z∈[-19,+112]mm · 5 of 50 slices shown]
[im 1/50]
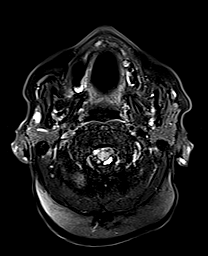
[im 13/50]
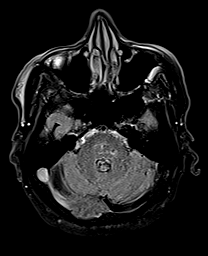
[im 25/50]
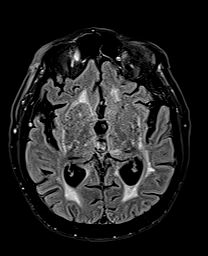
[im 37/50]
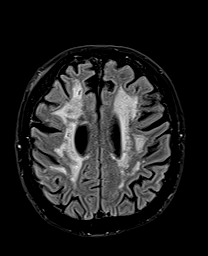
[im 50/50]
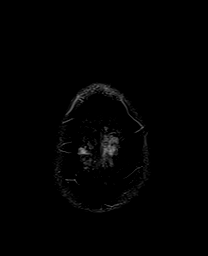

[Series 13: T1 · axial · 3.0mm · 0.45mm/px · z∈[-20,+113]mm · 5 of 51 slices shown]
[im 1/51]
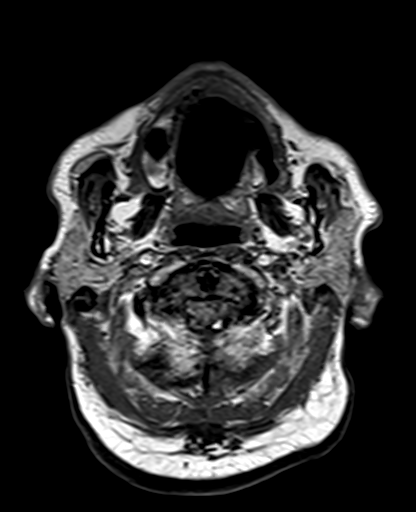
[im 13/51]
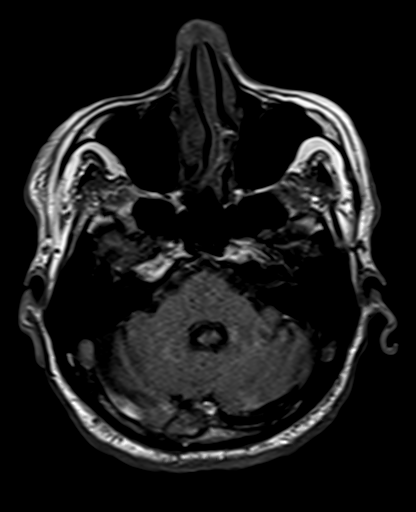
[im 26/51]
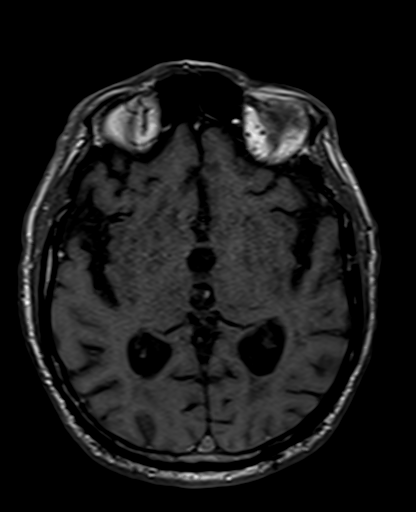
[im 38/51]
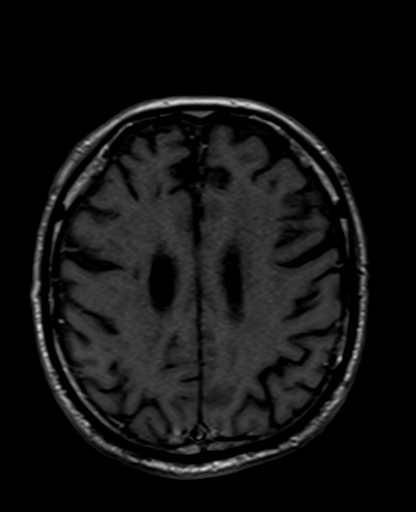
[im 51/51]
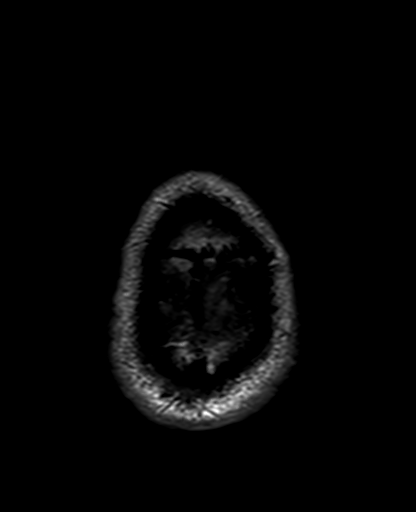

[Series 14: T2 · coronal · 5.0mm · 0.86mm/px · 2 of 24 slices shown (3 of 3)]
[im 1/24]
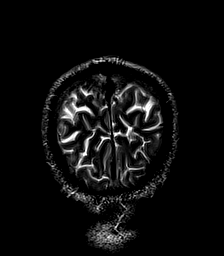
[im 24/24]
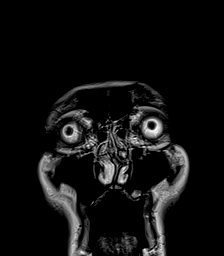

[42 of 48 positions shown; findings below may reference images not displayed]

FINDINGS: Brain: Acute left PICA territory infarction. Subcentimeter areas of
restricted diffusion at the genu of the corpus callosum on the left,
deep right occipital lobe, and in the right frontal parietal white
matter. Chronic microhemorrhages attributed primarily to chronic
small vessel ischemia, although there are some lobar hemorrhages as
well along the cerebral convexities. Symmetric posterior thalamic
mineralization. High-density within the deep left cerebellum spans
infarcted and non infarcted brain and pre-exists the stroke. No
acute hematoma by CT. Confluent FLAIR hyperintensity in the cerebral
white matter.

Vascular: No right transverse or sigmoid flow void but patent based
on preceding CTA

Skull and upper cervical spine: Normal marrow signal

Sinuses/Orbits: Negative
IMPRESSION: 1. Scattered acute infarcts in anterior and posterior circulation
suggesting embolic disease. The largest affects the left PICA
territory.
2. Background of advanced chronic small vessel ischemia.
3. Numerous chronic microhemorrhages with overlapping patterns of
small-vessel disease and amyloid

## 2021-05-14 NOTE — ED Notes (Signed)
Pt linens changed. Repositioned. Offered oral hydration. Restraints remain off. Pt pleasant, calm and cooperative. Male purwick replaced.

## 2021-05-14 NOTE — Progress Notes (Signed)
Pt noted to have a external heart monitor (ZIO AT) placed on the white board in his room 3w31. After talking w/ Dr. Cyd Silence, whom which was not on [place], but offered his assistance (highly appreciated), stated that since the monitor was off the pt, cardiology/rep should replace monitor d/t pt possibly needing a new monitor. External monitor left on white board in room 3w31, placed by EMS.

## 2021-05-14 NOTE — Progress Notes (Signed)
Clinical/Bedside Swallow Evaluation Patient Details  Name: Victor Fox MRN: [id number] Date of Birth: 1946/08/03  Today's Date: 05/14/2021 Time: SLP Start Time (ACUTE ONLY): 0910 SLP Stop Time (ACUTE ONLY): 0939 SLP Time Calculation (min) (ACUTE ONLY): 29 min  Past Medical History:  Past Medical History:  Diagnosis Date   Arthritis    Benign neoplasm of unspecified site    Carotid artery disease ([place])    a. carotid duplex 05/2015: less tahn 39% stenosis bilaterally, stable over the past year, recommend repeat imaging in 2 years (05/2017)   Compression fracture    a. L2   CVA (cerebral vascular accident) ([place]) 06/2012   Diabetes mellitus without complication ([place])    Heart murmur    a. echo 06/2012: EF >55%, LVH, aortic valve sclerosis   Hyperlipidemia    Hypertension    Neuropathy    Pneumonia    Tobacco abuse    a. smoked x 25 years   Vitamin D deficiency    Past Surgical History:  Past Surgical History:  Procedure Laterality Date   ARTHRODESIS     BACK SURGERY     x 2   discectomy     trigger release     HPI:  pt is a 75 yo male adm to [facility] ED - diagnosed with acute left PICA territory CVA, DM, dysphagia, Afib, LV thrombus, cognitive deficit, COVID August 2022.  Pt failed [facility] RN swallow screen and SLP swallow eval ordered.   Assessment / Plan / Recommendation Clinical Impression  Patient presents with suspected pharyngeal dysphagia *potential mild oral deficits* with concern for potential aspiration of thin liquids even via tsp c/b cough post-swallow.  Suspect component of both decreased oral control and delayed pharyngeal swallow initiation consistent with neurological base dysphagia.  No focal CN deficits observed and pt's voice is low in intensity but clear.   He is edentulous upper and has anterior lower dentition - which further compromises his mastication ability.  Overt coughing noted post-swallow of thin water via tsp and straw with chin tuck posture.  No  overt/immediate indications of aspiration with nectar liquids- however swallow did appear delayed with delayed cough.  Pt did allow oral care with toothbrush/paste provided by SLP. Given pt has not undergone prior instrumental evaluation and has new CVA, recommend MBS to assure he is on the least restrictive diet. Recommend medication with puree and nectar liquids at this time with allow of ice chips.  Informed pt of recommendations and plan, to which he said "I'm not having any swallowing test".  Encouraged him that testing is needed to determine what diet he may tolerate- will plan MBS at 1300 - informed RN of recommendation.  Thanks. SLP Visit Diagnosis: Dysphagia, oropharyngeal phase (R13.12);Dysphagia, unspecified (R13.10)    Aspiration Risk  Moderate aspiration risk    Diet Recommendation          Other  Recommendations Oral Care Recommendations: Oral care BID Other Recommendations: Order thickener from pharmacy   Follow up Recommendations Skilled Nursing facility      Frequency and Duration            Prognosis Prognosis for Safe Diet Advancement: Good      Swallow Study   General Date of Onset: 05/14/21 HPI: pt is a 75 yo male adm to [facility] ED - diagnosed with acute left PICA territory CVA, DM, dysphagia, Afib, LV thrombus, cognitive deficit, COVID August 2022.  Pt failed [facility] RN swallow screen and SLP swallow eval ordered. Type  of Study: Bedside Swallow Evaluation Previous Swallow Assessment: bse at [facility] early Sept 2022 on dys3/nectar Diet Prior to this Study: NPO Temperature Spikes Noted: No Respiratory Status: Room air History of Recent Intubation: No Behavior/Cognition: Alert;Distractible;Requires cueing;Impulsive Oral Cavity Assessment: Dry Oral Care Completed by SLP: Yes Oral Cavity - Dentition: Adequate natural dentition;Dentures, not available (few lower dentition, no uppers - has dentures at facility per pt - does not use them consistently for po  intake) Self-Feeding Abilities: Other (Comment);Total assist (pt restrained for agitation) Patient Positioning:  (pt restrained for agitatation) Baseline Vocal Quality: Low vocal intensity Volitional Cough: Cognitively unable to elicit Volitional Swallow: Unable to elicit    Oral/Motor/Sensory Function Overall Oral Motor/Sensory Function: Generalized oral weakness (pt did not consistently follow directions for oral motor exam, able to seal lips on straw and tsp)   Ice Chips Ice chips: Impaired Pharyngeal Phase Impairments: Suspected delayed Swallow   Thin Liquid Thin Liquid: Impaired Presentation: Spoon;Straw;Cup Pharyngeal  Phase Impairments: Cough - Immediate;Suspected delayed Swallow Other Comments: chin tuck posture did not prevent cough    Nectar Thick Nectar Thick Liquid: Impaired Pharyngeal Phase Impairments: Suspected delayed Swallow;Throat Clearing - Immediate   Honey Thick Honey Thick Liquid: Not tested   Puree Puree: Within functional limits   Solid     Solid: Impaired Pharyngeal Phase Impairments: Suspected delayed Swallow;Cough - Delayed      Macario Golds 05/14/2021,10:11 AM Kathleen Lime, MS [place] Office [phone] Pager [phone]

## 2021-05-14 NOTE — ED Notes (Signed)
Entered patient's room to attempt to place back onto telemetry. Pt agitated and non-compliant with attempts. Pt yelling "get out of here you bums". See MAR for PRN administration.

## 2021-05-14 NOTE — Progress Notes (Signed)
Pt extremely physically aggressive and not allow staff to do interventions. Pt's bilat wrist restraints were not brought w/ pt and the unit has to order them. This RN will allow pt to calm down and will gather vital signs etc when restraints are available. Pt also refuses to answer any questions therefore a complete ans accurate admission documentation and pt assessment will be challenging.

## 2021-05-14 NOTE — Evaluation (Addendum)
Physical Therapy Evaluation Patient Details Name: Victor Fox MRN: [id number] DOB: [birth year] Today's Date: 05/14/2021  History of Present Illness  Victor Fox is a 75 y.o. male past medical history of recent acute ischemic stroke involving the right frontal lobe and left medulla on 04/18/2021, with concomitant incidental COVID-19 infection, hypertension, hyperlipidemia, moderate to severe aortic stenosis, diabetes, hypertension, hyperlipidemia, tobacco abuse, discharged to a SNF9/8/22.  Returns 05/12/21 after a fall.at the skilled nursing facility and CT head revealing a moderate size left cerebellar hypodensity concerning for a subacute stroke.  Clinical Impression  Patient  returns to ED after a fall at SNF. Patient requires 2 max assistance for bed mobility, in standing leaning posteriorly supported with RW.Patient Unable to take a step sideways.Assisted back onto gurney.  Patient  was calm and able to follow simple instructions for mobility. Recommend return to SNF for therapies.    Patient does demonstrate a decline in functional ability. Patient ambulated 68'  with 4 wheeled Rw and PT on 05/09/21.       Follow Up Recommendations SNF;Supervision/Assistance - 24 hour    Equipment Recommendations     none  Recommendations for Other Services       Precautions / Restrictions Precautions Precautions: Fall      Mobility  Bed Mobility Overal bed mobility: Needs Assistance Bed Mobility: Supine to Sit;Sit to Supine     Supine to sit: Total assist;+2 for safety/equipment;+2 for physical assistance;HOB elevated Sit to supine: Mod assist   General bed mobility comments: patient initiates moving legs and trunk, reaches for R rail to turn and attempt to sit upright. Patient  required max assistance to  get trunk fully upright and turn legs on bed edge. Patient placed legs back onto bed  when returning to supine.    Transfers Overall transfer level: Needs assistance Equipment used:  Rolling walker (2 wheeled) Transfers: Sit to/from Stand Sit to Stand: Max assist;+2 physical assistance;+2 safety/equipment         General transfer comment: posterior bias when standing, steady support, leaning back against  gurney. Unable to take a step sideways.  Ambulation/Gait -unable             General Gait Details: unable  Stairs            Wheelchair Mobility    Modified Rankin (Stroke Patients Only)       Balance Overall balance assessment: Needs assistance;History of Falls Sitting-balance support: No upper extremity supported;Feet supported Sitting balance-Leahy Scale: Poor Sitting balance - Comments: gradually improved in balance with less posterior leaning. Postural control: Posterior lean   Standing balance-Leahy Scale: Poor Standing balance comment: posterior leaning , propped on gurney.                             Pertinent Vitals/Pain Pain Assessment: No/denies pain    Home Living Family/patient expects to be discharged to:: Skilled nursing facility                      Prior Function Level of Independence: Needs assistance   Gait / Transfers Assistance Needed: Ambulated  100' w/ RW with PT 9/8 at [facility].  ADL's / Homemaking Assistance Needed: at SNF        Hand Dominance        Extremity/Trunk Assessment        Lower Extremity Assessment Lower Extremity Assessment: RLE deficits/detail;Generalized weakness RLE Deficits / Details: grossly +=,  bears weight when standing,    Cervical / Trunk Assessment Cervical / Trunk Assessment: Normal  Communication   Communication: No difficulties  Cognition Arousal/Alertness: Awake/alert Behavior During Therapy: WFL for tasks assessed/performed Overall Cognitive Status: Impaired/Different from baseline Area of Impairment: Orientation;Attention;Following commands                 Orientation Level: Time;Situation (oriented to hospital) Current Attention Level:  Focused   Following Commands: Follows one step commands inconsistently Safety/Judgement: Decreased awareness of safety;Decreased awareness of deficits            General Comments      Exercises     Assessment/Plan    PT Assessment Patient needs continued PT services  PT Problem List Decreased strength;Decreased mobility;Decreased safety awareness;Decreased knowledge of precautions;Decreased activity tolerance;Decreased cognition;Decreased balance       PT Treatment Interventions DME instruction;Therapeutic activities;Cognitive remediation;Gait training;Therapeutic exercise;Patient/family education;Functional mobility training;Balance training    PT Goals (Current goals can be found in the Care Plan section)  Acute Rehab PT Goals PT Goal Formulation: Patient unable to participate in goal setting Time For Goal Achievement: 05/28/21 Potential to Achieve Goals: Fair    Frequency Min 2X/week   Barriers to discharge        Co-evaluation               AM-PAC PT "6 Clicks" Mobility  Outcome Measure Help needed turning from your back to your side while in a flat bed without using bedrails?: A Lot Help needed moving from lying on your back to sitting on the side of a flat bed without using bedrails?: A Lot Help needed moving to and from a bed to a chair (including a wheelchair)?: Total Help needed standing up from a chair using your arms (e.g., wheelchair or bedside chair)?: Total Help needed to walk in hospital room?: Total Help needed climbing 3-5 steps with a railing? : Total 6 Click Score: 8    End of Session Equipment Utilized During Treatment: Gait belt Activity Tolerance: Patient tolerated treatment well Patient left: in bed;with call bell/phone within reach;with bed alarm set Nurse Communication: Mobility status PT Visit Diagnosis: Muscle weakness (generalized) (M62.81);Difficulty in walking, not elsewhere classified (R26.2);Unsteadiness on feet (R26.81)     Time: [id number] PT Time Calculation (min) (ACUTE ONLY): 15 min   Charges:   PT Evaluation $PT Eval Low Complexity: [place] PT Acute Rehabilitation Services Pager [phone] Office [phone]  Claretha Cooper 05/14/2021, 8:58 AM

## 2021-05-14 NOTE — Progress Notes (Signed)
SLP Cancellation Note  Patient Details Name: Victor Fox MRN: [id number] DOB: 1946-01-22   Cancelled treatment:       Reason Eval/Treat Not Completed: (P) Patient declined, no reason specified (Pt did not communicate with SLP when informed him of need for swallow test to determine best diet, SLP offered snacks, applesauce, etc but pt remained lying on his side and did not move his mitt from close to his mouth. Will continue efforts, dy1/nectar with allowance of single ice chips advised based on CSE.  Sent swallow precaution sign with pt and informed RN via secure chat.   Kathleen Lime, MS [facility] SLP Acute Rehab Services Office [phone] Pager [phone]  Victor Fox 05/14/2021, 2:09 PM

## 2021-05-14 NOTE — ED Notes (Signed)
Report given to [facility].  ETA is 15 mins.

## 2021-05-14 NOTE — ED Notes (Signed)
Pt placed back on telemetry monitor

## 2021-05-14 NOTE — ED Notes (Signed)
Dr. Zigmund Daniel notified of patient's non-cooperation with obtaining 1700 blood sugar. See MAR for PRN doses given today. Pt alert stating "you're a bum leave me alone". Dr. Zigmund Daniel stated okay to delay CBG and therefore insulin temporarily

## 2021-05-14 NOTE — ED Notes (Signed)
Attempted to obtain pt's CBG 2x (2nd attempt with RN for assist). Pt fiercely refused both times. Yelling "go", "leave me alone", "go to hell".

## 2021-05-14 NOTE — Progress Notes (Signed)
Occupational Therapy Evaluation  Per chart review patient was at SNF for rehab, recent D/C from [place] of patient's baseline however patient currently presents with generalized weakness, impaired balance with strong posterior lean needing max x2 to stand from gurney with rolling walker. Multiple attempts made with multimodal cues to try and correct patient posterior however continues to lean heavily against gurney. Attempted side stepping but unable. Recommend return to rehab at D/C from acute care as patient is high fall risk and needing significant assist for ADL tasks.     05/14/21 1100  OT Visit Information  Last OT Received On 05/14/21  Assistance Needed +2  History of Present Illness VERYL Victor Fox is a 75 y.o. male past medical history of recent acute ischemic stroke involving the right frontal lobe and left medulla on 04/18/2021, with concomitant incidental COVID-19 infection, hypertension, hyperlipidemia, moderate to severe aortic stenosis, diabetes, hypertension, hyperlipidemia, tobacco abuse, discharged to a SNF 05/09/21.  Returns 05/12/21 after a fall at the skilled nursing facility and CT head revealing a moderate size left cerebellar hypodensity concerning for a subacute stroke.  Precautions  Precautions Fall  Restrictions  Weight Bearing Restrictions No  Home Living  Family/patient expects to be discharged to: Skilled nursing facility  Prior Function  Level of Independence Needs assistance  Gait / Transfers Assistance Needed Ambulated  100' w/ RW with PT 9/8 at [facility].  ADL's / Homemaking Assistance Needed at SNF  Communication  Communication No difficulties  Pain Assessment  Pain Assessment Faces  Faces Pain Scale 0  Cognition  Arousal/Alertness Awake/alert  Behavior During Therapy WFL for tasks assessed/performed  Overall Cognitive Status No family/caregiver present to determine baseline cognitive functioning  Area of Impairment  Orientation;Attention;Following commands;Problem solving  Orientation Level Disoriented to;Time;Situation (knows hes in hospital "cone")  Current Attention Level Focused  Following Commands Follows one step commands inconsistently  Safety/Judgement Decreased awareness of safety;Decreased awareness of deficits  Problem Solving Requires verbal cues;Requires tactile cues  General Comments was not combative with therapy during assessment, difficulty following cues for trying to correct posture  Upper Extremity Assessment  Upper Extremity Assessment Difficult to assess due to impaired cognition (attempt to have patient perform UE ROM/strength testing, minimally performs)  ADL  Overall ADL's  Needs assistance/impaired  Grooming Minimal assistance;Sitting  Upper Body Bathing Minimal assistance;Sitting  Lower Body Bathing Maximal assistance;Sitting/lateral leans;Bed level  Upper Body Dressing  Minimal assistance;Sitting  Lower Body Dressing Total assistance;Sitting/lateral leans;Bed level  Toilet Transfer +2 for physical assistance;+2 for safety/equipment;RW;Maximal assistance  Toilet Transfer Details (indicate cue type and reason) to stand from edge of bed. strong posterior lean. unable to safely side step  Toileting- Clothing Manipulation and Hygiene Total assistance;Bed level  Functional mobility during ADLs +2 for physical assistance;+2 for safety/equipment;Rolling walker;Cueing for sequencing;Cueing for safety;Maximal assistance  General ADL Comments high fall risk, strong posterior lean in standing with difficulty correcting despite OT providing multimodal cues  Bed Mobility  Overal bed mobility Needs Assistance  Bed Mobility Supine to Sit;Sit to Supine  Supine to sit +2 for safety/equipment;+2 for physical assistance;HOB elevated;Max assist  Sit to supine Mod assist  General bed mobility comments patient initiates moving legs and trunk, reaches for rail to turn and attempt to sit upright.  Posterior bias, required max assistance to  get trunk fully upright and turn legs on bed edge. Patient placed legs back onto bed  when returning to supine.  Transfers  Overall transfer level Needs assistance  Equipment used Rolling  walker (2 wheeled)  Transfers Sit to/from Stand  Sit to Stand Max assist;+2 physical assistance;+2 safety/equipment  General transfer comment posterior bias when standing, steady support, leaning on  gurney. Unable to take a step sideways.  Balance  Overall balance assessment Needs assistance;History of Falls  Sitting-balance support No upper extremity supported;Feet supported  Sitting balance-Leahy Scale Poor  Sitting balance - Comments gradually improved in balance with less posterior leaning.  Postural control Posterior lean  Standing balance support During functional activity;Bilateral upper extremity supported  Standing balance-Leahy Scale Zero  Standing balance comment posterior lean and needing up to max A  OT - End of Session  Equipment Utilized During Treatment Gait belt;Rolling walker  Activity Tolerance Patient tolerated treatment well  Patient left in bed;with call bell/phone within reach;with bed alarm set  Nurse Communication Mobility status  OT Assessment  OT Recommendation/Assessment Patient needs continued OT Services  OT Visit Diagnosis Other abnormalities of gait and mobility (R26.89);Muscle weakness (generalized) (M62.81);Other symptoms and signs involving the nervous system (R29.898);History of falling (Z91.81)  OT Problem List Decreased strength;Decreased activity tolerance;Impaired balance (sitting and/or standing);Decreased safety awareness;Decreased cognition  OT Plan  OT Frequency (ACUTE ONLY) Min 2X/week  OT Treatment/Interventions (ACUTE ONLY) Self-care/ADL training;Therapeutic exercise;DME and/or AE instruction;Therapeutic activities;Balance training;Patient/family education;Neuromuscular education;Cognitive remediation/compensation   AM-PAC OT "6 Clicks" Daily Activity Outcome Measure (Version 2)  Help from another person eating meals? 3  Help from another person taking care of personal grooming? 3  Help from another person toileting, which includes using toliet, bedpan, or urinal? 1  Help from another person bathing (including washing, rinsing, drying)? 2  Help from another person to put on and taking off regular upper body clothing? 3  Help from another person to put on and taking off regular lower body clothing? 1  6 Click Score 13  Progressive Mobility  What is the highest level of mobility based on the progressive mobility assessment? Level 3 (Stands with assist) - Balance while standing  and cannot march in place  Mobility Sit up in bed/chair position for meals  OT Recommendation  Follow Up Recommendations SNF  OT Equipment None recommended by OT  Individuals Consulted  Consulted and Agree with Results and Recommendations Patient  Acute Rehab OT Goals  Patient Stated Goal "I need a warm blanket"  OT Goal Formulation With patient  Time For Goal Achievement 05/28/21  Potential to Achieve Goals Fair  OT Time Calculation  OT Start Time (ACUTE ONLY) 0758  OT Stop Time (ACUTE ONLY) 0815  OT Time Calculation (min) 17 min  OT General Charges  $OT Visit 1 Visit  OT Evaluation  $OT Eval Low Complexity 1 Low  Written Expression  Dominant Hand Right   Delbert Phenix OT OT pager: [phone]

## 2021-05-14 NOTE — Progress Notes (Signed)
PROGRESS NOTE    SUMTER GOZMAN  [id number] DOB: 14-Nov-1945 DOA: 05/12/2021 PCP: Patient, No Pcp Per (Inactive)    Brief Narrative: HPI per Dr. Cyd Silence on 05/13/2021. 75 year old male with past medical history of diabetes mellitus type 2, hypertension, hyperlipidemia, coronary artery disease, diastolic congestive heart failure (Echo 04/2021 EF 60-65% with G1DD), moderate-severe aortic stenosis and diabetic polyneuropathy as well as multiple strokes in the past who presents to [facility] emergency department status post fall while at [place] facility.     Of note, patient was hospitalized at [facility] from 8/17 until 9/8.  Patient was found to have multiple small infarcts of the left medullary pyramid and superior right frontal lobe.  Patient was initiated on a 21-day course of aspirin and Plavix and a Zio patch was additionally initiated for evaluation for underlying arrhythmias.  Hospitalization was complicated by incidental COVID-19 positivity requiring patient to be conservatively managed for 10 days.  Patient was eventually discharged to [facility] skilled nursing facility on 9/8.   Patient explains that he got up to walk down the hall and while he was ambulating he suddenly felt a period of lightheadedness and generalized intense weakness leading to him falling to the floor.  Patient denies any actual loss of consciousness however.  Patient denies any focal weakness, slurring of his words, headaches, palpitations or chest pain.   EMS was contacted and the patient was promptly evaluated and brought into [facility] emergency department for evaluation.   Upon evaluation in the emergency department patient's laceration above the right eye was sutured.  CT imaging of the head was performed revealing a moderate hypodensity of the left cerebellum concerning for an acute/subacute infarct that is certainly new compared to previous imaging performed during the  last hospitalization.  Due to this finding the hospitalist group was then called to assess the patient for admission to the hospital.  Assessment & Plan:   Principal Problem:   Acute stroke due to occlusion of left posterior cerebral artery (HCC) Active Problems:   Essential hypertension   Carotid artery disease ([place])   Type 2 diabetes mellitus with diabetic polyneuropathy, with long-term current use of insulin (HCC)   Chronic diastolic CHF (congestive heart failure) ([place])   Mixed diabetic hyperlipidemia associated with type 2 diabetes mellitus ([place])   #1 recurrent acute to subacute strokes-patient was admitted to [facility] from 04/17/2021 to 05/09/2021 and then discharged to SNF.  he was discharged on aspirin and Plavix for 21 days and then to continue aspirin alone.  He had Zio patch placed during his hospital stay in [facility].  This was taken out yesterday 05/13/2021 to get MRI done.  This will need to be mailed back to the manufacturer.   He was found to have multiple small infarcts in the left medullary pyramid and superior right frontal lobe. He had incidental COVID-19 during that admission. 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  Background of advanced chronic small vessel ischemia.  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 Seen by neurology and cardiology. He was refusing and noncooperative to cardiology, likely to have a TEE done on 9/15 if he does not refuse CT angiogram head and neck last month no large vessel occlusion.  Bilateral vertebral artery atherosclerosis is stable from the CT angiogram done last month with severe focal stenosis of the left vertebral  V2 segment at the C4 level and moderate left V4 plaque and stenosis.  Left vertebral artery remains patent.  Left PICA origin remains patent and normal.   Intracranial atherosclerosis appears stable since last month.  Right ICA bulb plaque with 60% stenosis.  Continue aspirin. Neurology feels that he is at high risk for cerebral hemorrhage with anticoagulation, if he were to be anticoagulated for possible A. fib or LV thrombus.   Echocardiogram 04/19/2021 ejection fraction 60 to 65% with grade 1 diastolic dysfunction moderately dilated left atrium severe calcification of the aortic valve with moderate to severe aortic valve stenosis. Hemoglobin A1c was 9.5 LDL was 103.   #2 type 2 diabetes uncontrolled with an A1c of 9.5, on NovoLog Mix 70/30 15 units twice daily  #3 essential hypertension stable continue current meds enalapril, Norvasc 10 mg daily  #4 CAD/moderate to severe aortic stenosis-TEE scheduled for 05/16/2021  #5 hyperlipidemia continue Lipitor 40 mg daily   Estimated body mass index is 26.89 kg/m as calculated from the following:   Height as of 04/17/21: 6' (1.829 m).   Weight as of 05/01/21: 89.9 kg.  DVT prophylaxis: Lovenox  code Status: Full code  family Communication: Discussed with his friend [name] patient has no family members that are in touch with him Disposition Plan:  Status is: Inpatient  Remains inpatient appropriate because:Altered mental status  Dispo: The patient is from: SNF              Anticipated d/c is to: SNF              Patient currently is not medically stable to d/c.   Difficult to place patient No   Consultants:  Cardiology and neurology  Procedures: None Antimicrobials:   Subjective: He is resting in bed Had a bad night with patient being aggressive biting and kicking he received 1 dose of Ativan 0.5 mg  Objective: Vitals:   05/14/21 0800 05/14/21 0900 05/14/21 1000 05/14/21 1100  BP: (!) 150/67 119/86 (!) 157/113 (!) 158/68  Pulse: 72 70 78 69  Resp:  '12 17 14  '$ Temp:      TempSrc:      SpO2: 99% 100% 100% 98%   No intake or output data in the 24 hours ending 05/14/21 1237 There  were no vitals filed for this visit.  Examination:  General exam: Appears calm and comfortable  Respiratory system: Clear to auscultation. Respiratory effort normal. Cardiovascular system: S1 & S2 heard, RRR. No JVD, murmurs, rubs, gallops or clicks. No pedal edema. Gastrointestinal system: Abdomen is nondistended, soft and nontender. No organomegaly or masses felt. Normal bowel sounds heard. Central nervous system: Appears sleepy moves all extremities Extremities: No edema Skin: No rashes, lesions or ulcers Psychiatry: Judgement and insight appear normal. Mood & affect appropriate.     Data Reviewed: I have personally reviewed following labs and imaging studies  CBC: Recent Labs  Lab 05/09/21 0431 05/13/21 0032 05/13/21 0436 05/14/21 0532  WBC 8.1 13.3* 11.1* 10.2  NEUTROABS  --  11.1* 8.2* 6.6  HGB 13.3 15.9 14.7 15.1  HCT 38.0* 46.9 43.9 43.6  MCV 84.3 85.7 86.6 84.5  PLT 175 198 192 0000000   Basic Metabolic Panel: Recent Labs  Lab 05/09/21 0431 05/13/21 0032 05/13/21 0436 05/14/21 0532  NA 140 138 134* 138  K 4.0 4.4 4.1 3.4*  CL 101 98 98 99  CO2 32 '27 27 27  '$ GLUCOSE 124* 220* 206* 109*  BUN '11 18 19 '$ 13  CREATININE 0.79 0.84 0.81 0.64  CALCIUM 8.6* 9.3 8.9 8.9  MG  --   --  2.3 2.4   GFR: Estimated Creatinine Clearance: 88.9 mL/min (by C-G formula based on SCr of 0.64 mg/dL). Liver Function Tests: Recent Labs  Lab 05/13/21 0032 05/13/21 0436 05/14/21 0532  AST '17 16 31  '$ ALT '18 17 18  '$ ALKPHOS 79 71 74  BILITOT 1.5* 1.2 1.7*  PROT 6.9 6.4* 6.8  ALBUMIN 3.8 3.4* 3.7   No results for input(s): LIPASE, AMYLASE in the last 168 hours. No results for input(s): AMMONIA in the last 168 hours. Coagulation Profile: Recent Labs  Lab 05/13/21 0032 05/13/21 0436  INR 1.1 1.1   Cardiac Enzymes: No results for input(s): CKTOTAL, CKMB, CKMBINDEX, TROPONINI in the last 168 hours. BNP (last 3 results) No results for input(s): PROBNP in the last 8760  hours. HbA1C: No results for input(s): HGBA1C in the last 72 hours. CBG: Recent Labs  Lab 05/12/21 2051 05/13/21 0753 05/13/21 2139 05/14/21 0758 05/14/21 0952  GLUCAP 145* 130* 178* 107* 146*   Lipid Profile: No results for input(s): CHOL, HDL, LDLCALC, TRIG, CHOLHDL, LDLDIRECT in the last 72 hours. Thyroid Function Tests: No results for input(s): TSH, T4TOTAL, FREET4, T3FREE, THYROIDAB in the last 72 hours. Anemia Panel: No results for input(s): VITAMINB12, FOLATE, FERRITIN, TIBC, IRON, RETICCTPCT in the last 72 hours. Sepsis Labs: No results for input(s): PROCALCITON, LATICACIDVEN in the last 168 hours.  Recent Results (from the past 240 hour(s))  Resp Panel by RT-PCR (Flu A&B, Covid) Nasopharyngeal Swab     Status: None   Collection Time: 05/13/21 12:14 AM   Specimen: Nasopharyngeal Swab; Nasopharyngeal(NP) swabs in vial transport medium  Result Value Ref Range Status   SARS Coronavirus 2 by RT PCR NEGATIVE NEGATIVE Final    Comment: (NOTE) SARS-CoV-2 target nucleic acids are NOT DETECTED.  The SARS-CoV-2 RNA is generally detectable in upper respiratory specimens during the acute phase of infection. The lowest concentration of SARS-CoV-2 viral copies this assay can detect is 138 copies/mL. A negative result does not preclude SARS-Cov-2 infection and should not be used as the sole basis for treatment or other patient management decisions. A negative result may occur with  improper specimen collection/handling, submission of specimen other than nasopharyngeal swab, presence of viral mutation(s) within the areas targeted by this assay, and inadequate number of viral copies(<138 copies/mL). A negative result must be combined with clinical observations, patient history, and epidemiological information. The expected result is Negative.  Fact Sheet for Patients:  EntrepreneurPulse.com.au  Fact Sheet for Healthcare Providers:   IncredibleEmployment.be  This test is no t yet approved or cleared by the [place] FDA and  has been authorized for detection and/or diagnosis of SARS-CoV-2 by FDA under an Emergency Use Authorization (EUA). This EUA will remain  in effect (meaning this test can be used) for the duration of the COVID-19 declaration under Section 564(b)(1) of the Act, 21 U.S.C.section 360bbb-3(b)(1), unless the authorization is terminated  or revoked sooner.       Influenza A by PCR NEGATIVE NEGATIVE Final   Influenza B by PCR NEGATIVE NEGATIVE Final    Comment: (NOTE) The Xpert Xpress SARS-CoV-2/FLU/RSV plus assay is intended as an aid in the diagnosis of influenza from Nasopharyngeal swab specimens and should not be used as a sole basis for treatment. Nasal washings and aspirates are unacceptable for Xpert Xpress SARS-CoV-2/FLU/RSV testing.  Fact Sheet for Patients: EntrepreneurPulse.com.au  Fact Sheet for Healthcare Providers: IncredibleEmployment.be  This test is not yet approved or cleared by the [place] and has been authorized for detection and/or diagnosis of SARS-CoV-2 by FDA under an Emergency Use Authorization (EUA). This EUA will remain in effect (meaning this test can be used) for the duration of the COVID-19 declaration under Section 564(b)(1) of the Act, 21 U.S.C. section 360bbb-3(b)(1), unless the authorization is terminated or revoked.  Performed at [facility], [place] [address], [place], [place] [address]          Radiology Studies: CT ANGIO HEAD W OR WO CONTRAST  Result Date: 05/13/2021 CLINICAL DATA:  75 year old male with neurologic deficit. Fall. Hypodense cerebellum on earlier plain head CT compatible with left PICA infarct. EXAM: CT ANGIOGRAPHY HEAD AND NECK TECHNIQUE: Multidetector CT imaging of the head and neck was performed using the standard protocol during bolus  administration of intravenous contrast. Multiplanar CT image reconstructions and MIPs were obtained to evaluate the vascular anatomy. Carotid stenosis measurements (when applicable) are obtained utilizing NASCET criteria, using the distal internal carotid diameter as the denominator. CONTRAST:  124m OMNIPAQUE IOHEXOL 350 MG/ML SOLN COMPARISON:  Plain head CT 2316 hours last night. CTA head and neck 04/18/2021. CT chest 09/01/2018. FINDINGS: CTA NECK Skeleton: Absent maxillary dentition. Cervical spine and craniocervical junction degeneration. C5-C6 ankylosis. No acute osseous abnormality identified. Upper chest: Chronic left upper lobe peribronchial nodularity extending to the apical pleura. This has not significantly changed since 2020 And most resembles impacted distal airways and/or chronic post inflammatory changes. Visible central pulmonary arteries are patent. No superior mediastinal lymphadenopathy. Other neck: Small volume retained secretions layering in the pharynx. Otherwise negative. Aortic arch: Calcified aortic atherosclerosis. Three vessel arch configuration. Right carotid system: Brachiocephalic artery plaque without stenosis. Mild proximal right CCA soft plaque without stenosis. Calcified plaque in the mid right CCA before the bifurcation without stenosis. Moderate calcified plaque at the right carotid bifurcation and continuing into the ICA bulb. Bulky calcified plaque just distal to the bulb with 60 % stenosis with respect to the distal vessel (series 5, image 106), stable. The right ICA remains patent to the skull base. Left carotid system: Left CCA soft and calcified plaque without stenosis before the bifurcation. Circumferential calcified plaque at the left ICA origin and bulb with less than 50 % stenosis with respect to the distal vessel is stable from last month. Vertebral arteries: Proximal right subclavian artery plaque is mild without stenosis. Calcified plaque at the right vertebral  artery origin only results in mild stenosis on series 8, image 174. Right vertebral is mildly dominant and patent to the skull base without additional plaque or stenosis. Soft and calcified proximal left subclavian artery plaque without significant stenosis. Left vertebral artery origin is normal on series 8, image 174. Left V1 segment is tortuous and there is distal V1 calcified plaque although with only mild stenosis. The left vertebral is codominant. In the left mid V2 segment at C4 there is stable high-grade stenosis (series 7, image 232) which appears related to soft plaque. The vessel remains patent to the skull base with mild V3 segment plaque but no other extracranial stenosis. CTA HEAD Posterior circulation: Distal right vertebral artery is patent to the basilar with focal V4 segment plaque resulting in mild to moderate stenosis on series 7, image 148. The left V4 segment is irregular with a greater degree of calcified plaque and moderate stenosis but remains patent to the basilar. And the left PICA origin is patent on series 7, image 149  and appears stable. Patent basilar artery without stenosis. Patent SCA and PCA origins. Posterior communicating arteries are diminutive or absent. Left PCA branches are within normal limits. At the right PCA bifurcation there is mild to moderate stenosis but the distal right PCA branches remain patent (series 12, image 17). Anterior circulation: Both ICA siphons are patent. Calcified siphon plaque on the left results in mild stenosis. Normal left ophthalmic artery origin. Calcified plaque on the right results in mild to moderate stenosis at both the precavernous and proximal supraclinoid segments. Patent carotid termini, MCA and ACA origins. Diminutive or absent anterior communicating artery. Bilateral ACA branches are patent. There is moderate distal left A2 stenosis which is stable on series 12, image 22. Right MCA M1 segment and bifurcation are patent without stenosis.  Right MCA branches are patent with mild irregularity. Left MCA M1 segment is irregular but with only mild stenosis. Left MCA bifurcation is patent also with mild stenosis. And there is moderate stenosis of a proximal middle M2 branch suspected on series 9, image 142. Left MCA branches are stable since last month. Venous sinuses: Early contrast timing but patent. Anatomic variants: None. Review of the MIP images confirms the above findings IMPRESSION: 1. Negative for large vessel occlusion. Bilateral Vertebral Artery atherosclerosis is stable from the CTA last month, with Severe focal stenosis of the Left Vertebral V2 segment at the C4 level, and moderate left V4 plaque and stenosis. But the Left Vertebral Artery remains patent. The Left PICA origin remains patent and normal. 2. Other extra, and intracranial atherosclerosis appears stable since last month, notable for: - Right ICA bulb plaque with 60% stenosis - Moderate stenosis right ICA siphon due to calcified plaque. - Moderate stenosis left ACA distal A2 segment. - mild left MCA M1 stenosis, and up to Moderate stenosis of a middle Left M2 branch. - mild to moderate stenosis at the right PCA bifurcation. 3. Chronic left upper lobe peribronchial nodularity, not significantly changed from 2020 and most resembles impacted distal airways and/or chronic post inflammatory change. 4. Aortic Atherosclerosis (ICD10-I70.0). Electronically Signed   By: Genevie Ann M.D.   On: 05/13/2021 06:50   CT HEAD WO CONTRAST (5MM)  Result Date: 05/12/2021 CLINICAL DATA:  Head trauma fall with laceration EXAM: CT HEAD WITHOUT CONTRAST TECHNIQUE: Contiguous axial images were obtained from the base of the skull through the vertex without intravenous contrast. COMPARISON:  CT 04/18/2021, MRI 04/17/2021 FINDINGS: Brain: Interval moderate hypodensity within the left cerebellum consistent with acute to subacute infarct. No hemorrhage. Atrophy and advanced chronic small vessel ischemic changes  of the white matter. Stable ventricle size. Vascular: No hyperdense vessels. Vertebral and carotid vascular calcification Skull: Normal. Negative for fracture or focal lesion. Sinuses/Orbits: No acute finding. Other: Moderate right forehead hematoma IMPRESSION: 1. Interval development of moderate hypodensity within the left cerebellum, consistent with acute to subacute infarct. No acute intracranial hemorrhage is visualized. 2. Atrophy with advanced chronic small vessel ischemic changes of the white matter. 3. Moderate right forehead scalp hematoma Electronically Signed   By: Donavan Foil M.D.   On: 05/12/2021 23:39   CT ANGIO NECK W OR WO CONTRAST  Result Date: 05/13/2021 CLINICAL DATA:  75 year old male with neurologic deficit. Fall. Hypodense cerebellum on earlier plain head CT compatible with left PICA infarct. EXAM: CT ANGIOGRAPHY HEAD AND NECK TECHNIQUE: Multidetector CT imaging of the head and neck was performed using the standard protocol during bolus administration of intravenous contrast. Multiplanar CT image reconstructions and MIPs were obtained  to evaluate the vascular anatomy. Carotid stenosis measurements (when applicable) are obtained utilizing NASCET criteria, using the distal internal carotid diameter as the denominator. CONTRAST:  190m OMNIPAQUE IOHEXOL 350 MG/ML SOLN COMPARISON:  Plain head CT 2316 hours last night. CTA head and neck 04/18/2021. CT chest 09/01/2018. FINDINGS: CTA NECK Skeleton: Absent maxillary dentition. Cervical spine and craniocervical junction degeneration. C5-C6 ankylosis. No acute osseous abnormality identified. Upper chest: Chronic left upper lobe peribronchial nodularity extending to the apical pleura. This has not significantly changed since 2020 And most resembles impacted distal airways and/or chronic post inflammatory changes. Visible central pulmonary arteries are patent. No superior mediastinal lymphadenopathy. Other neck: Small volume retained secretions  layering in the pharynx. Otherwise negative. Aortic arch: Calcified aortic atherosclerosis. Three vessel arch configuration. Right carotid system: Brachiocephalic artery plaque without stenosis. Mild proximal right CCA soft plaque without stenosis. Calcified plaque in the mid right CCA before the bifurcation without stenosis. Moderate calcified plaque at the right carotid bifurcation and continuing into the ICA bulb. Bulky calcified plaque just distal to the bulb with 60 % stenosis with respect to the distal vessel (series 5, image 106), stable. The right ICA remains patent to the skull base. Left carotid system: Left CCA soft and calcified plaque without stenosis before the bifurcation. Circumferential calcified plaque at the left ICA origin and bulb with less than 50 % stenosis with respect to the distal vessel is stable from last month. Vertebral arteries: Proximal right subclavian artery plaque is mild without stenosis. Calcified plaque at the right vertebral artery origin only results in mild stenosis on series 8, image 174. Right vertebral is mildly dominant and patent to the skull base without additional plaque or stenosis. Soft and calcified proximal left subclavian artery plaque without significant stenosis. Left vertebral artery origin is normal on series 8, image 174. Left V1 segment is tortuous and there is distal V1 calcified plaque although with only mild stenosis. The left vertebral is codominant. In the left mid V2 segment at C4 there is stable high-grade stenosis (series 7, image 232) which appears related to soft plaque. The vessel remains patent to the skull base with mild V3 segment plaque but no other extracranial stenosis. CTA HEAD Posterior circulation: Distal right vertebral artery is patent to the basilar with focal V4 segment plaque resulting in mild to moderate stenosis on series 7, image 148. The left V4 segment is irregular with a greater degree of calcified plaque and moderate stenosis  but remains patent to the basilar. And the left PICA origin is patent on series 7, image 149 and appears stable. Patent basilar artery without stenosis. Patent SCA and PCA origins. Posterior communicating arteries are diminutive or absent. Left PCA branches are within normal limits. At the right PCA bifurcation there is mild to moderate stenosis but the distal right PCA branches remain patent (series 12, image 17). Anterior circulation: Both ICA siphons are patent. Calcified siphon plaque on the left results in mild stenosis. Normal left ophthalmic artery origin. Calcified plaque on the right results in mild to moderate stenosis at both the precavernous and proximal supraclinoid segments. Patent carotid termini, MCA and ACA origins. Diminutive or absent anterior communicating artery. Bilateral ACA branches are patent. There is moderate distal left A2 stenosis which is stable on series 12, image 22. Right MCA M1 segment and bifurcation are patent without stenosis. Right MCA branches are patent with mild irregularity. Left MCA M1 segment is irregular but with only mild stenosis. Left MCA bifurcation is patent also  with mild stenosis. And there is moderate stenosis of a proximal middle M2 branch suspected on series 9, image 142. Left MCA branches are stable since last month. Venous sinuses: Early contrast timing but patent. Anatomic variants: None. Review of the MIP images confirms the above findings IMPRESSION: 1. Negative for large vessel occlusion. Bilateral Vertebral Artery atherosclerosis is stable from the CTA last month, with Severe focal stenosis of the Left Vertebral V2 segment at the C4 level, and moderate left V4 plaque and stenosis. But the Left Vertebral Artery remains patent. The Left PICA origin remains patent and normal. 2. Other extra, and intracranial atherosclerosis appears stable since last month, notable for: - Right ICA bulb plaque with 60% stenosis - Moderate stenosis right ICA siphon due to  calcified plaque. - Moderate stenosis left ACA distal A2 segment. - mild left MCA M1 stenosis, and up to Moderate stenosis of a middle Left M2 branch. - mild to moderate stenosis at the right PCA bifurcation. 3. Chronic left upper lobe peribronchial nodularity, not significantly changed from 2020 and most resembles impacted distal airways and/or chronic post inflammatory change. 4. Aortic Atherosclerosis (ICD10-I70.0). Electronically Signed   By: Genevie Ann M.D.   On: 05/13/2021 06:50   MR BRAIN WO CONTRAST  Result Date: 05/14/2021 CLINICAL DATA:  Neuro deficit with acute stroke suspected EXAM: MRI HEAD WITHOUT CONTRAST TECHNIQUE: Multiplanar, multiecho pulse sequences of the brain and surrounding structures were obtained without intravenous contrast. COMPARISON:  CT and CTA from yesterday FINDINGS: Brain: Acute left PICA territory infarction. Subcentimeter areas of restricted diffusion at the genu of the corpus callosum on the left, deep right occipital lobe, and in the right frontal parietal white matter. Chronic microhemorrhages attributed primarily to chronic small vessel ischemia, although there are some lobar hemorrhages as well along the cerebral convexities. Symmetric posterior thalamic mineralization. High-density within the deep left cerebellum spans infarcted and non infarcted brain and pre-exists the stroke. No acute hematoma by CT. Confluent FLAIR hyperintensity in the cerebral white matter. Vascular: No right transverse or sigmoid flow void but patent based on preceding CTA Skull and upper cervical spine: Normal marrow signal Sinuses/Orbits: Negative IMPRESSION: 1. Scattered acute infarcts in anterior and posterior circulation suggesting embolic disease. The largest affects the left PICA territory. 2. Background of advanced chronic small vessel ischemia. 3. 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    On: 05/14/2021 07:34        Scheduled Meds:   stroke: mapping our early stages of recovery book   Does not apply Once   amLODipine  10 mg Oral Daily   aspirin EC  81 mg Oral Daily   atorvastatin  40 mg Oral QHS   enalapril  10 mg Oral Daily   enoxaparin (LOVENOX) injection  40 mg Subcutaneous Q24H   insulin aspart  0-15 Units Subcutaneous TID AC & HS   insulin aspart protamine- aspart  15 Units Subcutaneous BID AC   Continuous Infusions:   LOS: 1 day    Time spent: 38 minutes    Georgette Shell, MD 05/14/2021, 12:37 PM

## 2021-05-14 NOTE — ED Notes (Signed)
Patient transported to MRI 

## 2021-05-15 ENCOUNTER — Inpatient Hospital Stay (HOSPITAL_COMMUNITY): Payer: Medicare Other

## 2021-05-15 DIAGNOSIS — R4182 Altered mental status, unspecified: Secondary | ICD-10-CM

## 2021-05-15 DIAGNOSIS — I63532 Cerebral infarction due to unspecified occlusion or stenosis of left posterior cerebral artery: Secondary | ICD-10-CM | POA: Diagnosis not present

## 2021-05-15 LAB — CBC WITH DIFFERENTIAL/PLATELET
Abs Immature Granulocytes: 0.12 10*3/uL — ABNORMAL HIGH (ref 0.00–0.07)
Basophils Absolute: 0 10*3/uL (ref 0.0–0.1)
Basophils Relative: 0 %
Eosinophils Absolute: 0 10*3/uL (ref 0.0–0.5)
Eosinophils Relative: 0 %
HCT: 46.4 % (ref 39.0–52.0)
Hemoglobin: 15.7 g/dL (ref 13.0–17.0)
Immature Granulocytes: 1 %
Lymphocytes Relative: 6 %
Lymphs Abs: 1.2 10*3/uL (ref 0.7–4.0)
MCH: 28.9 pg (ref 26.0–34.0)
MCHC: 33.8 g/dL (ref 30.0–36.0)
MCV: 85.5 fL (ref 80.0–100.0)
Monocytes Absolute: 1.5 10*3/uL — ABNORMAL HIGH (ref 0.1–1.0)
Monocytes Relative: 7 %
Neutro Abs: 18.7 10*3/uL — ABNORMAL HIGH (ref 1.7–7.7)
Neutrophils Relative %: 86 %
Platelets: 199 10*3/uL (ref 150–400)
RBC: 5.43 MIL/uL (ref 4.22–5.81)
RDW: 13.7 % (ref 11.5–15.5)
WBC: 21.6 10*3/uL — ABNORMAL HIGH (ref 4.0–10.5)
nRBC: 0 % (ref 0.0–0.2)

## 2021-05-15 LAB — COMPREHENSIVE METABOLIC PANEL
ALT: 18 U/L (ref 0–44)
AST: 27 U/L (ref 15–41)
Albumin: 3.6 g/dL (ref 3.5–5.0)
Alkaline Phosphatase: 82 U/L (ref 38–126)
Anion gap: 18 — ABNORMAL HIGH (ref 5–15)
BUN: 13 mg/dL (ref 8–23)
CO2: 25 mmol/L (ref 22–32)
Calcium: 9.5 mg/dL (ref 8.9–10.3)
Chloride: 96 mmol/L — ABNORMAL LOW (ref 98–111)
Creatinine, Ser: 0.85 mg/dL (ref 0.61–1.24)
GFR, Estimated: >60 mL/min (ref >60)
Glucose, Bld: 137 mg/dL — ABNORMAL HIGH (ref 70–99)
Potassium: 4.2 mmol/L (ref 3.5–5.1)
Sodium: 139 mmol/L (ref 135–145)
Total Bilirubin: 2 mg/dL — ABNORMAL HIGH (ref 0.3–1.2)
Total Protein: 6.5 g/dL (ref 6.5–8.1)

## 2021-05-15 LAB — URINALYSIS, ROUTINE W REFLEX MICROSCOPIC
Glucose, UA: NEGATIVE mg/dL
Ketones, ur: 40 mg/dL — AB
Leukocytes,Ua: NEGATIVE
Nitrite: POSITIVE — AB
Protein, ur: NEGATIVE mg/dL
Specific Gravity, Urine: >1.03 — ABNORMAL HIGH (ref 1.005–1.030)
pH: 6 (ref 5.0–8.0)

## 2021-05-15 LAB — GLUCOSE, CAPILLARY
Glucose-Capillary: 176 mg/dL — ABNORMAL HIGH (ref 70–99)
Glucose-Capillary: 188 mg/dL — ABNORMAL HIGH (ref 70–99)
Glucose-Capillary: 154 mg/dL — ABNORMAL HIGH (ref 70–99)
Glucose-Capillary: 99 mg/dL (ref 70–99)

## 2021-05-15 LAB — URINALYSIS, MICROSCOPIC (REFLEX)
RBC / HPF: NONE SEEN RBC/hpf (ref 0–5)
Squamous Epithelial / HPF: NONE SEEN (ref 0–5)

## 2021-05-15 LAB — LIPID PANEL
Cholesterol: 104 mg/dL (ref 0–200)
HDL: 40 mg/dL — ABNORMAL LOW (ref >40)
LDL Cholesterol: 53 mg/dL (ref 0–99)
Total CHOL/HDL Ratio: 2.6 RATIO
Triglycerides: 55 mg/dL (ref <150)
VLDL: 11 mg/dL (ref 0–40)

## 2021-05-15 LAB — BRAIN NATRIURETIC PEPTIDE: B Natriuretic Peptide: 206.6 pg/mL — ABNORMAL HIGH (ref 0.0–100.0)

## 2021-05-15 LAB — PROCALCITONIN: Procalcitonin: 0.26 ng/mL

## 2021-05-15 LAB — MAGNESIUM: Magnesium: 2.3 mg/dL (ref 1.7–2.4)

## 2021-05-15 IMAGING — DX DG CHEST 1V PORT
1 series · 1 of 1 positions shown · non-contrast
Comparison: [DATE]

CLINICAL DATA: Shortness of breath.

EXAM:
PORTABLE CHEST 1 VIEW

[chest]
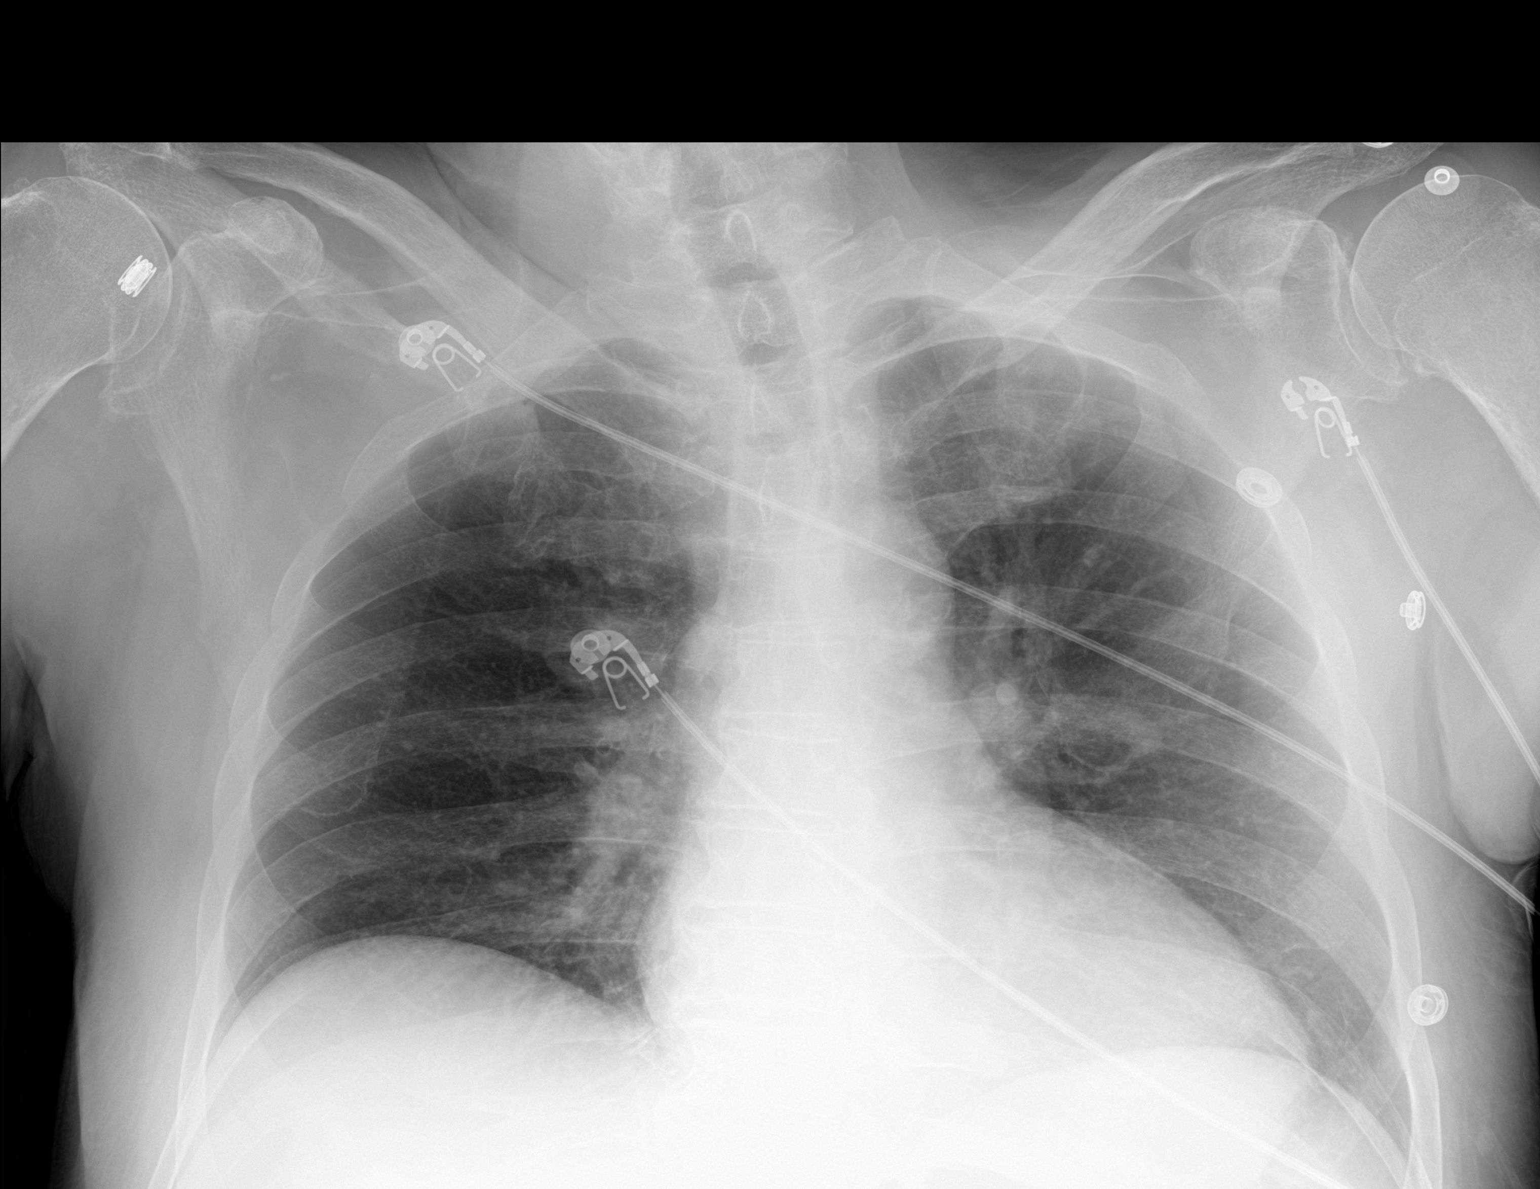

[1 of 1 positions shown; findings below may reference images not displayed]

FINDINGS: [KR] hours. Low volume film. The lungs are clear without focal
pneumonia, edema, pneumothorax or pleural effusion.
Cardiopericardial silhouette is at upper limits of normal for size.
The visualized bony structures of the thorax show no acute
abnormality. Telemetry leads overlie the chest.
IMPRESSION: No active disease.

## 2021-05-15 MED ORDER — CLOPIDOGREL BISULFATE 75 MG PO TABS
75.0000 mg | ORAL_TABLET | Freq: Every day | ORAL | Status: DC
  Administered 2021-05-15 – 2021-05-23 (×7): 75 mg via ORAL
  Filled 2021-05-15 (×9): qty 1

## 2021-05-15 MED ORDER — LACTATED RINGERS IV SOLN: INTRAVENOUS | Status: DC

## 2021-05-15 MED ORDER — HALOPERIDOL LACTATE 5 MG/ML IJ SOLN
2.0000 mg | Freq: Four times a day (QID) | INTRAMUSCULAR | Status: DC | PRN
  Administered 2021-05-15 – 2021-05-17 (×3): 2 mg via INTRAVENOUS
  Filled 2021-05-15 (×2): qty 1

## 2021-05-15 MED ORDER — ATORVASTATIN CALCIUM 80 MG PO TABS
80.0000 mg | ORAL_TABLET | Freq: Every day | ORAL | Status: DC
  Administered 2021-05-15 – 2021-05-22 (×7): 80 mg via ORAL
  Filled 2021-05-15 (×7): qty 1

## 2021-05-15 NOTE — Progress Notes (Signed)
Pt refused meds. MD notified

## 2021-05-15 NOTE — Progress Notes (Signed)
  Speech Language Pathology Treatment: Dysphagia  Patient Details Name: Victor Fox MRN: [id number] DOB: [birth year] Today's Date: 05/15/2021 Time: [id number] SLP Time Calculation (min) (ACUTE ONLY): 12 min  Assessment / Plan / Recommendation Clinical Impression  Pt seen for skilled SLP to assess for tolerance of po intake, ability to participate in [facility] and education re: dysphagia.  Pt is awake but does not answer questions.  RN present and reports she had given pt his insulin, thus SLP proceeded with po attempts of breakfast.  Pt did accept intake of orange juice via straw - while yelling out "Leave me alone".  Pt required assistance for feeding as he has restraints and ? some weakness making it difficult to place straw in mouth.   After subtle cough x2 of 6 boluses - pt again yelled out for SLP to leave.  He declined to consume any pureed waffles, sausage despite offering.   Noted pt's right leg to start shaking in repetitive motion that stopped when SLP placed hand on his leg.  Recommend continue diet with strict aspiration precautions but doubt pt will meet nutritional needs with current agitation.  Suspect pt will not participate in MBS given mentation.  Educated pt to reasoning for diet but pt is agitated to extent where he does not comprehend.    HPI HPI: 75 y.o. male Covid Positive this admit with a past medical history of significant diabetes mellitus type 2, hypertension, hyperlipidemia, neuropathy, vertigo, carotid artery disease, coronary artery disease, history of CVA, former smoker, who presented to ED with complaint of dizziness. He is a very poor historian and unable to provide much more detail. Currently mildly dizzy without focal neurologic deficits. 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 Unsure of pt's baseline Cognitive status.  Being followed by PT/OT this admit.. Chest x-ray also  negative for any acute disease at admit.      SLP Plan  Continue with current plan of care       Recommendations  Diet recommendations: Dysphagia 1 (puree);Nectar-thick liquid Liquids provided via: Cup;Straw Medication Administration: Whole meds with puree Supervision: Full supervision/cueing for compensatory strategies Compensations: Minimize environmental distractions;Slow rate;Small sips/bites;Lingual sweep for clearance of pocketing;Multiple dry swallows after each bite/sip;Follow solids with liquid Postural Changes and/or Swallow Maneuvers: Out of bed for meals;Seated upright 90 degrees;Upright 30-60 min after meal                Oral Care Recommendations: Oral care BID Follow up Recommendations: Skilled Nursing facility SLP Visit Diagnosis: Dysphagia, oropharyngeal phase (R13.12);Dysphagia, unspecified (R13.10) Plan: Continue with current plan of care       GO               Victor Lime, MS [place] Office [phone] Pager [phone]  Victor Fox 05/15/2021, 10:00 AM

## 2021-05-15 NOTE — Progress Notes (Addendum)
PROGRESS NOTE    Victor Fox  [id number] DOB: [birth year] DOA: 05/12/2021 PCP: Patient, No Pcp Per (Inactive)    Brief Narrative: HPI per Dr. Cyd Silence on 05/13/2021. 75 year old male with past medical history of diabetes mellitus type 2, hypertension, hyperlipidemia, coronary artery disease, diastolic congestive heart failure (Echo 04/2021 EF 60-65% with G1DD), moderate-severe aortic stenosis and diabetic polyneuropathy as well as multiple strokes in the past who presents to [facility] long hospital emergency department status post fall while at [place] facility.  His work-up in ER suggested a laceration above the right eye which was sutured, CT and MRI confirmed acute strokes .   Patient was admitted to [facility].  He was seen by neurology and cardiology.  His stay was complicated by severe agitation, [facility] he has been restrained for the last 2 days, has been transferred to [facility] for my care on 05/15/2021.  He is currently in  restraints and confused.  Neurology and cardiology to see in follow-up.    Of note, patient was hospitalized at [facility] from 8/17 until 9/8.  Patient was found to have multiple small infarcts of the left medullary pyramid and superior right frontal lobe.  Patient was initiated on a 21-day course of aspirin and Plavix and a Zio patch was additionally initiated for evaluation for underlying arrhythmias.  Hospitalization was complicated by incidental COVID-19 positivity requiring patient to be conservatively managed for 10 days.  Patient was eventually discharged to [facility] skilled nursing facility on 9/8.     Subjective:  Patient in bed, restrained, appears to be in no distress, overall appears calm and comfortable but not answering any questions.  We will all 4 extremities to painful stimuli.   Assessment & Plan:    #1  Recurrent acute to subacute strokes-patient was admitted to [facility] from 04/17/2021 to  05/09/2021 and then discharged to SNF.  he was discharged on aspirin and Plavix for 21 days and then to continue aspirin alone.  He had Zio patch placed during his hospital stay in [facility].  This was taken out yesterday 05/13/2021 to get MRI done.  This will need to be mailed back to the manufacturer. Now he is found to have multiple small infarcts in the left medullary pyramid and superior right frontal lobe -   Neurology and cardiology are following plan is to have TEE if patient allows it to be done, full stroke pathway underway, A1c and LDL both are above goal.  He is currently on aspirin and statin for secondary prevention.  I have increased his statin dose for better LDL control.  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  He is high risk for bleeding.  For now continue aspirin and high intensity statin.  Further work-up as directed by stroke team.   #2 DM2 uncontrolled with an A1c of 9.5, on NovoLog Mix 70/30 15 units twice daily  CBG (last 3)  Recent Labs    05/14/21 1248 05/14/21 2131 05/15/21 0626  GLUCAP 78 94 176*   Lab Results  Component Value Date   HGBA1C 9.5 (H) 04/18/2021     #3 HTN -  stable continue current meds enalapril, Norvasc 10 mg daily  #4 CAD/moderate to severe aortic stenosis-TEE scheduled for 05/16/2021  #5 Hyperlipidemia -  Lipitor increased to 80 mg daily.  #6.  Carotid artery disease more significant on the  right side.  Continue aspirin and statin for secondary prevention, continue glycemic control.  Defer further management to stroke team.  #7.  Leukocytosis on 05/15/2021.  Afebrile.  Exam benign, chest x-ray stable, however is at risk for aspiration due to her underlying encephalopathy, speech following, currently on soft diet.  Continue to monitor closely with aspiration precautions and feeding assistance.  #8.  Severe metabolic encephalopathy requiring restraints  and as needed medications ongoing for a few days at [place] long and now continues at [facility] on 05/15/2021.  Will avoid benzodiazepines, Haldol as needed, EEG done initially was unremarkable we will repeat EEG as he still seems to have spells of lucidity and agitation.  Neurology following.  Neck is supple.  #9. Mod. AS - outpt Cards follow up. Poor TAVR candidate.     Procedures -   MRI -  1. Scattered acute infarcts in anterior and posterior circulation suggesting embolic disease. The largest affects the left PICA territory. 2. Background of advanced chronic small vessel ischemia. 3. 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 Left ventricular ejection fraction, by estimation, is 60 to 65%. The left ventricle has normal function. The left ventricle has no regional wall motion abnormalities. There is moderate left ventricular hypertrophy. Left ventricular diastolic parameters are consistent with Grade I diastolic dysfunction (impaired relaxation).   2. Right ventricular systolic function is normal. The right ventricular size is normal.   3. Left atrial size was mildly dilated.   4. The aortic valve is normal in structure. There is severe calcifcation of the aortic valve. Aortic valve regurgitation is mild. Moderate to severe aortic valve stenosis. Aortic valve area, by VTI measures 1.20 cm. Aortic valve mean gradient measures 28.5 mmHg. Aortic valve Vmax measures 3.41 m/s. .   CT angiogram head and neck last month no large vessel occlusion.  Bilateral vertebral artery atherosclerosis is stable from the CT angiogram done last month with severe focal stenosis of the left vertebral V2 segment at the C4 level and moderate left V4 plaque and stenosis.  Left vertebral artery remains patent.  Left PICA origin remains patent and normal.  Intracranial atherosclerosis appears stable since last month.  Right ICA bulb  plaque with 60% stenosis.    DVT prophylaxis: Lovenox  Code Status: Full code  Family Communication: Called friend [name] [phone] on 05/15/2021-9:15 AM.  Message left. Disposition Plan:  Status is: Inpatient  Remains inpatient appropriate because:Altered mental status  Dispo: The patient is from: SNF              Anticipated d/c is to: SNF              Patient currently is not medically stable to d/c.   Difficult to place patient No   Consultants:  Cardiology and neurology  Procedures: None Antimicrobials:    Objective: Vitals:   05/14/21 2049 05/15/21 0013 05/15/21 0427 05/15/21 0745  BP: (!) 168/73 138/65 (!) 154/74 (!) 147/63  Pulse: 84 73 80 84  Resp: '13 19 19 18  '$ Temp: 98.4 F (36.9 C) 98.2 F (36.8 C) 98.1 F (36.7 C) 98 F (36.7 C)  TempSrc: Axillary Axillary Axillary Axillary  SpO2: 100% 100% 99% 100%  Weight:   81.2 kg     Intake/Output Summary (Last 24 hours) at 05/15/2021 1010 Last data filed at 05/15/2021 0015 Gross per 24 hour  Intake 120 ml  Output 150 ml  Net -30  ml   Filed Weights   05/15/21 0427  Weight: 81.2 kg    Examination:  Sleeping and somnolent, supple neck, in no distress, is all 4 extremities to painful stimuli, both upper extremities in restraints  [employer].AT,PERRAL Supple Neck,No JVD, No cervical lymphadenopathy appriciated.  Symmetrical Chest wall movement, Good air movement bilaterally, CTAB RRR,No Gallops, Rubs or new Murmurs, No Parasternal Heave +ve B.Sounds, Abd Soft, No tenderness, No organomegaly appriciated, No rebound - guarding or rigidity. No Cyanosis, Clubbing or edema, No new Rash or bruise     Data Reviewed: I have personally reviewed following labs and imaging studies Recent Labs  Lab 05/09/21 0431 05/13/21 0032 05/13/21 0436 05/14/21 0532 05/15/21 0330  WBC 8.1 13.3* 11.1* 10.2 21.6*  HGB 13.3 15.9 14.7 15.1 15.7  HCT 38.0* 46.9 43.9 43.6 46.4  PLT 175 198 192 176 199  MCV 84.3 85.7 86.6 84.5 85.5   MCH 29.5 29.1 29.0 29.3 28.9  MCHC 35.0 33.9 33.5 34.6 33.8  RDW 13.4 13.6 13.6 13.6 13.7  LYMPHSABS  --  1.5 2.2 2.5 1.2  MONOABS  --  0.6 0.7 0.9 1.5*  EOSABS  --  0.0 0.0 0.1 0.0  BASOSABS  --  0.0 0.0 0.0 0.0    Recent Labs  Lab 05/09/21 0431 05/13/21 0032 05/13/21 0436 05/14/21 0532 05/15/21 0330  NA 140 138 134* 138 139  K 4.0 4.4 4.1 3.4* 4.2  CL 101 98 98 99 96*  CO2 32 '27 27 27 25  '$ GLUCOSE 124* 220* 206* 109* 137*  BUN '11 18 19 13 13  '$ CREATININE 0.79 0.84 0.81 0.64 0.85  CALCIUM 8.6* 9.3 8.9 8.9 9.5  AST  --  '17 16 31 27  '$ ALT  --  '18 17 18 18  '$ ALKPHOS  --  79 71 74 82  BILITOT  --  1.5* 1.2 1.7* 2.0*  ALBUMIN  --  3.8 3.4* 3.7 3.6  MG  --   --  2.3 2.4 2.3  INR  --  1.1 1.1  --   --   BNP  --   --   --  70.1 206.6*   Lab Results  Component Value Date   HGBA1C 9.5 (H) 04/18/2021   Lab Results  Component Value Date   CHOL 162 04/19/2021   HDL 36 (L) 04/19/2021   LDLCALC 103 (H) 04/19/2021   TRIG 116 04/19/2021   CHOLHDL 4.5 04/19/2021      Radiology Studies: MR BRAIN WO CONTRAST  Result Date: 05/14/2021 CLINICAL DATA:  Neuro deficit with acute stroke suspected EXAM: MRI HEAD WITHOUT CONTRAST TECHNIQUE: Multiplanar, multiecho pulse sequences of the brain and surrounding structures were obtained without intravenous contrast. COMPARISON:  CT and CTA from yesterday FINDINGS: Brain: Acute left PICA territory infarction. Subcentimeter areas of restricted diffusion at the genu of the corpus callosum on the left, deep right occipital lobe, and in the right frontal parietal white matter. Chronic microhemorrhages attributed primarily to chronic small vessel ischemia, although there are some lobar hemorrhages as well along the cerebral convexities. Symmetric posterior thalamic mineralization. High-density within the deep left cerebellum spans infarcted and non infarcted brain and pre-exists the stroke. No acute hematoma by CT. Confluent FLAIR hyperintensity in the  cerebral white matter. Vascular: No right transverse or sigmoid flow void but patent based on preceding CTA Skull and upper cervical spine: Normal marrow signal Sinuses/Orbits: Negative IMPRESSION: 1. Scattered acute infarcts in anterior and posterior circulation suggesting embolic disease. The largest affects the left  PICA territory. 2. Background of advanced chronic small vessel ischemia. 3. 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   On: 05/14/2021 07:34   DG Chest Port 1 View  Result Date: 05/15/2021 CLINICAL DATA:  Shortness of breath. EXAM: PORTABLE CHEST 1 VIEW COMPARISON:  04/17/2021 FINDINGS: 0710 hours. Low volume film. The lungs are clear without focal pneumonia, edema, pneumothorax or pleural effusion. Cardiopericardial silhouette is at upper limits of normal for size. The visualized bony structures of the thorax show no acute abnormality. Telemetry leads overlie the chest. IMPRESSION: No active disease. Electronically Signed   By: Misty Stanley M.D.   On: 05/15/2021 07:28     Scheduled Meds:   stroke: mapping our early stages of recovery book   Does not apply Once   amLODipine  10 mg Oral Daily   aspirin EC  81 mg Oral Daily   atorvastatin  40 mg Oral QHS   enalapril  10 mg Oral Daily   enoxaparin (LOVENOX) injection  40 mg Subcutaneous Q24H   insulin aspart  0-15 Units Subcutaneous TID AC & HS   insulin aspart protamine- aspart  15 Units Subcutaneous BID AC   Continuous Infusions:  lactated ringers       LOS: 2 days    Time spent: 38 minutes  Lala Lund, MD 05/15/2021, 10:10 AM

## 2021-05-15 NOTE — Progress Notes (Signed)
After multiple attempts at bedside, EEG  leads placed and EEG recording started with fair impedance. P8/Pz were at 12 ohms. A lot of movement artifact present. Results pending.

## 2021-05-15 NOTE — Plan of Care (Signed)
  Problem: Safety: Goal: Non-violent Restraint(s) Outcome: Progressing   Problem: Education: Goal: Knowledge of disease or condition will improve Outcome: Progressing Goal: Knowledge of secondary prevention will improve Outcome: Progressing Goal: Knowledge of patient specific risk factors addressed and post discharge goals established will improve Outcome: Progressing Goal: Individualized Educational Video(s) Outcome: Progressing   Problem: Health Behavior/Discharge Planning: Goal: Ability to manage health-related needs will improve Outcome: Progressing   Problem: Ischemic Stroke/TIA Tissue Perfusion: Goal: Complications of ischemic stroke/TIA will be minimized Outcome: Progressing

## 2021-05-15 NOTE — Procedures (Signed)
Patient Name: Victor Fox  MRN: [id number]  Epilepsy Attending: Lora Havens  Referring Physician/Provider: Dr Lala Lund Date: 05/15/2021 Duration: 29.34 mins  Patient history: 75 year old male with altered mental status.  EEG to evaluate for seizures.  Level of alertness: Awake  AEDs during EEG study: None  Technical aspects: This EEG study was done with scalp electrodes positioned according to the 10-20 International system of electrode placement. Electrical activity was acquired at a sampling rate of '500Hz'$  and reviewed with a high frequency filter of '70Hz'$  and a low frequency filter of '1Hz'$ . EEG data were recorded continuously and digitally stored.   Description: No clear posterior dominant rhythm was seen.  EEG showed continuous generalized polymorphic sharply contoured 3 to 6 Hz theta-delta slowing, at times with triphasic morphology. Hyperventilation and photic stimulation were not performed.     ABNORMALITY - Continuous slow, generalized  IMPRESSION: This study is suggestive of moderate diffuse encephalopathy, nonspecific to etiology. No seizures or epileptiform discharges were seen throughout the recording.  Emmanual Gauthreaux Barbra Sarks

## 2021-05-15 NOTE — Progress Notes (Signed)
$'2mg'O$  Haldol was given for agitation to do EEG at bedside. Haldol did not work, patient combative, uncooperative and thrashing around in bed. Nurse letting attending know. Tech informed neurology.

## 2021-05-15 NOTE — Plan of Care (Signed)
  Problem: Safety: Goal: Non-violent Restraint(s) Outcome: Progressing   Problem: Education: Goal: Knowledge of disease or condition will improve Outcome: Progressing Goal: Knowledge of secondary prevention will improve Outcome: Progressing Goal: Knowledge of patient specific risk factors addressed and post discharge goals established will improve Outcome: Progressing Goal: Individualized Educational Video(s) Outcome: Progressing   Problem: Coping: Goal: Will verbalize positive feelings about self Outcome: Progressing   Problem: Ischemic Stroke/TIA Tissue Perfusion: Goal: Complications of ischemic stroke/TIA will be minimized Outcome: Progressing

## 2021-05-15 NOTE — Progress Notes (Addendum)
STROKE TEAM PROGRESS NOTE   INTERVAL HISTORY Patient has remained agitated since transfer from [facility], and he has been restrained for the past two days. EEG is being done. MRI shows scattered embolic infarcts in anterior posterior circulation largest involving the left PICA.  There are chronic numerous microhemorrhages also suggesting small vessel disease versus amyloid angiopathy. No one is at the bedside at time of this examination.   Vitals:   05/14/21 2049 05/15/21 0013 05/15/21 0427 05/15/21 0745  BP: (!) 168/73 138/65 (!) 154/74 (!) 147/63  Pulse: 84 73 80 84  Resp: '13 19 19 18  '$ Temp: 98.4 F (36.9 C) 98.2 F (36.8 C) 98.1 F (36.7 C) 98 F (36.7 C)  TempSrc: Axillary Axillary Axillary Axillary  SpO2: 100% 100% 99% 100%  Weight:   81.2 kg    CBC:  Recent Labs  Lab 05/14/21 0532 05/15/21 0330  WBC 10.2 21.6*  NEUTROABS 6.6 18.7*  HGB 15.1 15.7  HCT 43.6 46.4  MCV 84.5 85.5  PLT 176 [id number]   Basic Metabolic Panel:  Recent Labs  Lab 05/14/21 0532 05/15/21 0330  NA 138 139  K 3.4* 4.2  CL 99 96*  CO2 27 25  GLUCOSE 109* 137*  BUN 13 13  CREATININE 0.64 0.85  CALCIUM 8.9 9.5  MG 2.4 2.3    Lipid Panel: No results for input(s): CHOL, TRIG, HDL, CHOLHDL, VLDL, LDLCALC in the last 168 hours.   HgbA1c:  04/18/2021 9.5  Urine Drug Screen:  Recent Labs  Lab 05/13/21 0350  LABOPIA NONE DETECTED  COCAINSCRNUR NONE DETECTED  LABBENZ NONE DETECTED  AMPHETMU NONE DETECTED  THCU NONE DETECTED  LABBARB NONE DETECTED    Alcohol Level  Recent Labs  Lab 05/13/21 0032  ETH <10    IMAGING past 24 hours DG Chest Port 1 View  Result Date: 05/15/2021 CLINICAL DATA:  Shortness of breath. EXAM: PORTABLE CHEST 1 VIEW COMPARISON:  04/17/2021 FINDINGS: 0710 hours. Low volume film. The lungs are clear without focal pneumonia, edema, pneumothorax or pleural effusion. Cardiopericardial silhouette is at upper limits of normal for size. The visualized bony  structures of the thorax show no acute abnormality. Telemetry leads overlie the chest. IMPRESSION: No active disease. Electronically Signed   By: Misty Stanley M.D.   On: 05/15/2021 07:28    PHYSICAL EXAM Frail malnourished looking elderly Caucasian male not in distress.  Is less restless and agitated and in restraints. . Afebrile. Head is nontraumatic. Neck is supple without bruit.    Cardiac exam shows a harsh ejection systolic musical murmur.no  gallop. Lungs are clear to auscultation. Distal pulses are well felt.  Awake alert oriented to self, the month, but not the year.    Mild dysarthria. Has persistent right sided tremors at rest, as well as pill rolling tremor at right hand.  Slight diminished facial expression.  Glabellar tap positive. No evidence of gross aphasia noted Cranial nerve examination: Pupils equal round react light, extraocular movement intact, visual fields full, facial sensation intact, face symmetric, tongue and palate midline. Motor examination with antigravity 5/5 in all fours.  Not cooperative for detailed muscle testing Sensation intact light touch without extinction Coordination: No dysmetria by finger-nose-finger testing   Gait testing deferred at this time  ASSESSMENT/PLAN Victor Fox is a 75 y.o. male with history of  recent acute ischemic stroke involving the right frontal lobe and left medulla on 04/18/2021, with concomitant incidental COVID-19 infection, hypertension, hyperlipidemia, moderate to severe aortic  stenosis, diabetes, hypertension, hyperlipidemia, tobacco abuse, discharged to a SNF few days ago from [facility] on dual antiplatelets and Zio patch after stroke work-up was completed-presented to [facility] from SNF after he sustained a fall and hit his head on on the ground.  He has been complaining of frequent falls ever since his stroke but this fall was one where he had hit his head and had a head laceration, and came to the  hospital for further evaluation.  Reports of generalized weakness when he attempted to get up on multiple occasions.    MRI brain done this admission shows scattered acute infarcts in anterior and posterior circulation suggesting embolic disease, largest affecting the left PICA territory, as well as numerous chronic micro-hemorrhages with overlapping patterns of small vessel disease and amyloid.   Stroke:  bilateral cerebral hemisphere infarcts  embolic at left PICA territory and deep right occipital lobe and right frontal lobe white matter strokes secondary to likely cardiogenic embolism.  He also has concurrent extracranial intracranial atherosclerosis  CT head 05/12/2021:   Interval development of moderate hypodensity within the left cerebellum, consistent with acute to subacute infarct. No acute intracranial hemorrhage is visualized. 2. Atrophy with advanced chronic small vessel ischemic changes of the white matter. 3. Moderate right forehead scalp hematoma CTA head & neck : 05/13/2021 1. Negative for large vessel occlusion. Bilateral Vertebral Artery atherosclerosis is stable from the CTA last month, with Severe focal stenosis of the Left Vertebral V2 segment at the C4 level, and moderate left V4 plaque and stenosis. But the Left Vertebral Artery remains patent. The Left PICA origin remains patent and normal.  2. Other extra, and intracranial atherosclerosis appears stable since last month, notable for: - Right ICA bulb plaque with 60% stenosis - Moderate stenosis right ICA siphon due to calcified plaque. - Moderate stenosis left ACA distal A2 segment. - mild left MCA M1 stenosis, and up to Moderate stenosis of a middle Left M2 branch. - mild to moderate stenosis at the right PCA bifurcation. MRI  05/14/2021:  1. Scattered acute infarcts in anterior and posterior circulation suggesting embolic disease. The largest affects the left PICA territory. 2. Background of advanced chronic small  vessel ischemia. 3. Numerous chronic microhemorrhages with overlapping patterns of small-vessel disease and amyloid  2D Echo LVEF 60-65%, la mildly dilated, no IA shunt   LDL 103 HgbA1c 9.5 VTE prophylaxis - scd     Diet   DIET - DYS 1 Room service appropriate? Yes; Fluid consistency: Nectar Thick   aspirin 81 mg daily prior to admission, now on aspirin 81 mg daily.  Will add plavix '75mg'$  daily x 3 weeks, then aspirin alone.  Therapy recommendations:   SNF Disposition:  pending   Hypertension Home meds:  amlodipine, enalapril,  stable Permissive hypertension (OK if < 220/120) but gradually normalize in 5-7 days Long-term BP goal normotensive  Hyperlipidemia Home meds:  lipitor '40mg'$  ,  resumed in hospital LDL 103, goal < 70 Add higher dose  High intensity statin lipitor '80mg'$   Continue statin at discharge  Diabetes type II Uncontrolled Home meds:  insulin HgbA1c 9.5, goal < 7.0 CBGs Recent Labs    05/14/21 1248 05/14/21 2131 05/15/21 0626  GLUCAP 78 94 176*    SSI  Other Stroke Risk Factors  Advanced Age >/= 18   Cigarette smoker advised to stop smoking  Hx stroke/TIA   Coronary artery disease   Hospital day # 2 I have personally obtained history,examined this  patient, reviewed notes, independently viewed imaging studies, participated in medical decision making and plan of care.ROS completed by me personally and pertinent positives fully documented  I have made any additions or clarifications directly to the above note. Agree with note above.  Patient presented initially with by cerebral infarcts in August 2022 with suspicion for cardiogenic source of embolism and was wearing a Zio patch and now presents with bilateral embolic strokes strong suspicion for Atrial fibrillation even though the CT angiogram shows diffuse intra and extracranial atherosclerosis.  Patient also has right hemibody tremors which may suggest underlying Parkinson's disease and negative fall risk.   It is unclear to me whether the patient is a good long-term candidate for anticoagulation and will have to make that decision after careful review of risk-benefit.  Recommend aspirin and Plavix for 3 weeks followed by Plavix alone unless subsequent decision is made to revoke him off of A. fib and switch to anticoagulation if atrial fibrillation is found.  Patient also appears to have moderate aortic stenosis with his not clear if he is a candidate for treatment for that as well..  No family available at the bedside for discussion.  Discussed with Dr. Candiss Norse.  Greater than 50% time during this 35-minute visit was spent in counseling and coordination of care about his multiple embolic strokes hematemesis and discussion of evaluation and treatment and discussion with care team  Victor Contras, Victor Fox Medical Director [place] Pager: [phone] 05/15/2021 1:27 PM     To contact Stroke Continuity provider, please refer to [email or web address] After hours, contact General Neurology

## 2021-05-16 ENCOUNTER — Encounter (HOSPITAL_COMMUNITY)
Admission: EM | Disposition: A | Discharge status: Skilled Nursing Facility | Payer: Self-pay | Source: Skilled Nursing Facility | Attending: Internal Medicine

## 2021-05-16 DIAGNOSIS — I63532 Cerebral infarction due to unspecified occlusion or stenosis of left posterior cerebral artery: Secondary | ICD-10-CM | POA: Diagnosis not present

## 2021-05-16 LAB — COMPREHENSIVE METABOLIC PANEL
ALT: 16 U/L (ref 0–44)
AST: 18 U/L (ref 15–41)
Albumin: 3 g/dL — ABNORMAL LOW (ref 3.5–5.0)
Alkaline Phosphatase: 82 U/L (ref 38–126)
Anion gap: 11 (ref 5–15)
BUN: 18 mg/dL (ref 8–23)
CO2: 28 mmol/L (ref 22–32)
Calcium: 9.2 mg/dL (ref 8.9–10.3)
Chloride: 101 mmol/L (ref 98–111)
Creatinine, Ser: 0.87 mg/dL (ref 0.61–1.24)
GFR, Estimated: >60 mL/min (ref >60)
Glucose, Bld: 115 mg/dL — ABNORMAL HIGH (ref 70–99)
Potassium: 3.5 mmol/L (ref 3.5–5.1)
Sodium: 140 mmol/L (ref 135–145)
Total Bilirubin: 1.5 mg/dL — ABNORMAL HIGH (ref 0.3–1.2)
Total Protein: 6.2 g/dL — ABNORMAL LOW (ref 6.5–8.1)

## 2021-05-16 LAB — CBC WITH DIFFERENTIAL/PLATELET
Abs Immature Granulocytes: 0.18 10*3/uL — ABNORMAL HIGH (ref 0.00–0.07)
Basophils Absolute: 0 10*3/uL (ref 0.0–0.1)
Basophils Relative: 0 %
Eosinophils Absolute: 0 10*3/uL (ref 0.0–0.5)
Eosinophils Relative: 0 %
HCT: 42.9 % (ref 39.0–52.0)
Hemoglobin: 14.9 g/dL (ref 13.0–17.0)
Immature Granulocytes: 1 %
Lymphocytes Relative: 7 %
Lymphs Abs: 1.4 10*3/uL (ref 0.7–4.0)
MCH: 29.4 pg (ref 26.0–34.0)
MCHC: 34.7 g/dL (ref 30.0–36.0)
MCV: 84.8 fL (ref 80.0–100.0)
Monocytes Absolute: 1.2 10*3/uL — ABNORMAL HIGH (ref 0.1–1.0)
Monocytes Relative: 6 %
Neutro Abs: 18.7 10*3/uL — ABNORMAL HIGH (ref 1.7–7.7)
Neutrophils Relative %: 86 %
Platelets: 181 10*3/uL (ref 150–400)
RBC: 5.06 MIL/uL (ref 4.22–5.81)
RDW: 13.8 % (ref 11.5–15.5)
WBC: 21.6 10*3/uL — ABNORMAL HIGH (ref 4.0–10.5)
nRBC: 0 % (ref 0.0–0.2)

## 2021-05-16 LAB — MAGNESIUM: Magnesium: 2.3 mg/dL (ref 1.7–2.4)

## 2021-05-16 LAB — GLUCOSE, CAPILLARY
Glucose-Capillary: 121 mg/dL — ABNORMAL HIGH (ref 70–99)
Glucose-Capillary: 180 mg/dL — ABNORMAL HIGH (ref 70–99)
Glucose-Capillary: 142 mg/dL — ABNORMAL HIGH (ref 70–99)
Glucose-Capillary: 81 mg/dL (ref 70–99)

## 2021-05-16 LAB — BRAIN NATRIURETIC PEPTIDE: B Natriuretic Peptide: 56.8 pg/mL (ref 0.0–100.0)

## 2021-05-16 LAB — PROCALCITONIN: Procalcitonin: 0.58 ng/mL

## 2021-05-16 SURGERY — ECHOCARDIOGRAM, TRANSESOPHAGEAL
Anesthesia: Monitor Anesthesia Care

## 2021-05-16 MED ORDER — POTASSIUM CHLORIDE CRYS ER 20 MEQ PO TBCR
40.0000 meq | EXTENDED_RELEASE_TABLET | Freq: Once | ORAL | Status: AC
  Administered 2021-05-16: 40 meq via ORAL
  Filled 2021-05-16: qty 2

## 2021-05-16 MED ORDER — HALOPERIDOL LACTATE 5 MG/ML IJ SOLN: 2.0000 mg | Freq: Once | INTRAMUSCULAR | Status: DC

## 2021-05-16 MED ORDER — QUETIAPINE FUMARATE 25 MG PO TABS
25.0000 mg | ORAL_TABLET | Freq: Every day | ORAL | Status: DC
  Administered 2021-05-16: 25 mg via ORAL
  Filled 2021-05-16: qty 1
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
## 2021-05-16 NOTE — Plan of Care (Signed)
  Problem: Safety: Goal: Non-violent Restraint(s) Outcome: Progressing   Problem: Safety: Goal: Violent Restraint(s) Outcome: Progressing   Problem: Education: Goal: Knowledge of disease or condition will improve Outcome: Progressing Goal: Knowledge of secondary prevention will improve Outcome: Progressing Goal: Knowledge of patient specific risk factors addressed and post discharge goals established will improve Outcome: Progressing Goal: Individualized Educational Video(s) Outcome: Progressing   Problem: Coping: Goal: Will verbalize positive feelings about self Outcome: Progressing Goal: Will identify appropriate support needs Outcome: Progressing   Problem: Health Behavior/Discharge Planning: Goal: Ability to manage health-related needs will improve Outcome: Progressing   Problem: Ischemic Stroke/TIA Tissue Perfusion: Goal: Complications of ischemic stroke/TIA will be minimized Outcome: Progressing

## 2021-05-16 NOTE — Progress Notes (Signed)
Pt attempted to leave bed, combative with staff, verbally abusive, pulled out IV and pulled off tele leads. MD notified, order for restraints received, bilateral wrist restraints applied. Gave 2 mg haldol per MD order. Patient offered and took 8 oz nectar thick water. Resting comfortably at this time. Bed at lowest position, bed alarms on. Per MD order removed tele leads. Will continue to monitor.

## 2021-05-16 NOTE — Progress Notes (Signed)
STROKE TEAM PROGRESS NOTE   INTERVAL HISTORY Patient has remained agitated  and he has been restrained for the past two days.  He needed 2 mg of Haldol earlier this morning and now seems sedated.  EEG shows moderate diffuse encephalopathy and slowing.  No epileptiform activity.   No one is at the bedside at time of this examination.   Vitals:   05/15/21 2349 05/16/21 0333 05/16/21 0844 05/16/21 1236  BP: (!) 134/58 (!) 140/58 (!) 149/63 (!) 148/64  Pulse: 73 72 73 91  Resp: '18 15 18 20  '$ Temp: 97.8 F (36.6 C) 98.4 F (36.9 C) 98.5 F (36.9 C) 98.3 F (36.8 C)  TempSrc: Oral Axillary Axillary Oral  SpO2: 96% 97% 99% 95%  Weight:       CBC:  Recent Labs  Lab 05/15/21 0330 05/16/21 0409  WBC 21.6* 21.6*  NEUTROABS 18.7* 18.7*  HGB 15.7 14.9  HCT 46.4 42.9  MCV 85.5 84.8  PLT 199 0000000   Basic Metabolic Panel:  Recent Labs  Lab 05/15/21 0330 05/16/21 0409  NA 139 140  K 4.2 3.5  CL 96* 101  CO2 25 28  GLUCOSE 137* 115*  BUN 13 18  CREATININE 0.85 0.87  CALCIUM 9.5 9.2  MG 2.3 2.3    Lipid Panel:  Recent Labs  Lab 05/15/21 0854  CHOL 104  TRIG 55  HDL 40*  CHOLHDL 2.6  VLDL 11  LDLCALC 53     HgbA1c:  04/18/2021 9.5  Urine Drug Screen:  Recent Labs  Lab 05/13/21 0350  LABOPIA NONE DETECTED  COCAINSCRNUR NONE DETECTED  LABBENZ NONE DETECTED  AMPHETMU NONE DETECTED  THCU NONE DETECTED  LABBARB NONE DETECTED    Alcohol Level  Recent Labs  Lab 05/13/21 0032  ETH <10    IMAGING past 24 hours No results found.  PHYSICAL EXAM Frail malnourished looking elderly Caucasian male not in distress.  Is less restless and agitated and in restraints. . Afebrile. Head is nontraumatic. Neck is supple without bruit.    Cardiac exam shows a harsh ejection systolic musical murmur.no  gallop. Lungs are clear to auscultation. Distal pulses are well felt.  Awake alert not cooperative for exam.  Does not follow commands.    Mild dysarthria. Has persistent right  sided tremors at rest, as well as pill rolling tremor at right hand.  Slight diminished facial expression.  Glabellar tap positive. No evidence of gross aphasia noted Cranial nerve examination: Pupils equal round react light, extraocular movement intact, visual fields full, facial sensation intact, face symmetric, tongue and palate midline. Motor examination with antigravity 5/5 in all fours.  Not cooperative for detailed muscle testing Sensation intact light touch without extinction Coordination: No dysmetria by finger-nose-finger testing   Gait testing deferred at this time  ASSESSMENT/PLAN Mr. FADI HILGERT is a 75 y.o. male with history of  recent acute ischemic stroke involving the right frontal lobe and left medulla on 04/18/2021, with concomitant incidental COVID-19 infection, hypertension, hyperlipidemia, moderate to severe aortic stenosis, diabetes, hypertension, hyperlipidemia, tobacco abuse, discharged to a SNF few days ago from [facility] on dual antiplatelets and Zio patch after stroke work-up was completed-presented to [facility] from SNF after he sustained a fall and hit his head on on the ground.  He has been complaining of frequent falls ever since his stroke but this fall was one where he had hit his head and had a head laceration, and came to the hospital for  further evaluation.  Reports of generalized weakness when he attempted to get up on multiple occasions.    MRI brain done this admission shows scattered acute infarcts in anterior and posterior circulation suggesting embolic disease, largest affecting the left PICA territory, as well as numerous chronic micro-hemorrhages with overlapping patterns of small vessel disease and amyloid.   Stroke:  bilateral cerebral hemisphere infarcts  embolic at left PICA territory and deep right occipital lobe and right frontal lobe white matter strokes secondary to likely cardiogenic embolism.  He also has concurrent  extracranial intracranial atherosclerosis  CT head 05/12/2021:   Interval development of moderate hypodensity within the left cerebellum, consistent with acute to subacute infarct. No acute intracranial hemorrhage is visualized. 2. Atrophy with advanced chronic small vessel ischemic changes of the white matter. 3. Moderate right forehead scalp hematoma CTA head & neck : 05/13/2021 1. Negative for large vessel occlusion. Bilateral Vertebral Artery atherosclerosis is stable from the CTA last month, with Severe focal stenosis of the Left Vertebral V2 segment at the C4 level, and moderate left V4 plaque and stenosis. But the Left Vertebral Artery remains patent. The Left PICA origin remains patent and normal.  2. Other extra, and intracranial atherosclerosis appears stable since last month, notable for: - Right ICA bulb plaque with 60% stenosis - Moderate stenosis right ICA siphon due to calcified plaque. - Moderate stenosis left ACA distal A2 segment. - mild left MCA M1 stenosis, and up to Moderate stenosis of a middle Left M2 branch. - mild to moderate stenosis at the right PCA bifurcation. MRI  05/14/2021:  1. Scattered acute infarcts in anterior and posterior circulation suggesting embolic disease. The largest affects the left PICA territory. 2. Background of advanced chronic small vessel ischemia. 3. Numerous chronic microhemorrhages with overlapping patterns of small-vessel disease and amyloid  2D Echo LVEF 60-65%, la mildly dilated, no IA shunt   LDL 103 HgbA1c 9.5 VTE prophylaxis - scd     Diet   DIET - DYS 1 Room service appropriate? Yes; Fluid consistency: Nectar Thick   aspirin 81 mg daily prior to admission, now on aspirin 81 mg daily.  Will add plavix '75mg'$  daily x 3 weeks, then aspirin alone.  Therapy recommendations:   SNF Disposition:  pending   Hypertension Home meds:  amlodipine, enalapril,  stable Permissive hypertension (OK if < 220/120) but gradually normalize in  5-7 days Long-term BP goal normotensive  Hyperlipidemia Home meds:  lipitor '40mg'$  ,  resumed in hospital LDL 103, goal < 70 Add higher dose  High intensity statin lipitor '80mg'$   Continue statin at discharge  Diabetes type II Uncontrolled Home meds:  insulin HgbA1c 9.5, goal < 7.0 CBGs Recent Labs    05/15/21 2204 05/16/21 0630 05/16/21 1251  GLUCAP 99 121* 180*    SSI  Other Stroke Risk Factors  Advanced Age >/= 12   Cigarette smoker advised to stop smoking  Hx stroke/TIA   Coronary artery disease   Hospital day # 3 Patient presented  with bicerebral infarcts in August 2022 with suspicion for cardiogenic source of embolism and was wearing a Zio patch and now presents with bilateral embolic strokes strong suspicion for Atrial fibrillation even though the CT angiogram shows diffuse intra and extracranial atherosclerosis.  Patient also has right hemibody tremors which may suggest underlying Parkinson's disease and likely significant fall risk.  Patient has also been agitated and I suspect he is not a good long-term candidate for anticoagulation and will not recommend aggressive work-up  with cardiac monitoring at the present time.  May need look at this issue and outpatient follow-up if it shows significant improvement.  Recommend aspirin and Plavix for 3 weeks followed by Plavix alone  .  Patient also appears to have moderate aortic stenosis with his not clear if he is a candidate for treatment for that as well..  No family available at the bedside for discussion.  Discussed with Dr. Candiss Norse.  Greater than 50% time during this 25-minute visit was spent in counseling and coordination of care about his multiple embolic strokes hematemesis and discussion of evaluation and treatment and discussion with care team.  Stroke team will sign off.  Kindly call for questions  Antony Contras, MD Medical Director [place] Pager: [phone] 05/16/2021 2:21 PM     To contact Stroke  Continuity provider, please refer to [email or web address] After hours, contact General Neurology

## 2021-05-16 NOTE — Progress Notes (Signed)
CSW has attempted to contact [name] with APS yesterday and today, left messages asking for a call back. Per MD, APS is attempting to get guardianship, asking for medical records. CSW to follow and assist, complete assessment when able to reach APS social worker.  Laveda Abbe, [place] Clinical Social Worker [phone]

## 2021-05-16 NOTE — Plan of Care (Signed)
  Problem: Safety: Goal: Non-violent Restraint(s) Outcome: Progressing   Problem: Education: Goal: Knowledge of disease or condition will improve Outcome: Progressing Goal: Knowledge of secondary prevention will improve Outcome: Progressing Goal: Knowledge of patient specific risk factors addressed and post discharge goals established will improve Outcome: Progressing Goal: Individualized Educational Video(s) Outcome: Progressing   Problem: Coping: Goal: Will verbalize positive feelings about self Outcome: Progressing   Problem: Ischemic Stroke/TIA Tissue Perfusion: Goal: Complications of ischemic stroke/TIA will be minimized Outcome: Progressing

## 2021-05-17 DIAGNOSIS — Z515 Encounter for palliative care: Secondary | ICD-10-CM | POA: Diagnosis not present

## 2021-05-17 DIAGNOSIS — Z7189 Other specified counseling: Secondary | ICD-10-CM

## 2021-05-17 DIAGNOSIS — W19XXXA Unspecified fall, initial encounter: Secondary | ICD-10-CM

## 2021-05-17 DIAGNOSIS — I5032 Chronic diastolic (congestive) heart failure: Secondary | ICD-10-CM | POA: Diagnosis not present

## 2021-05-17 DIAGNOSIS — I63532 Cerebral infarction due to unspecified occlusion or stenosis of left posterior cerebral artery: Secondary | ICD-10-CM | POA: Diagnosis not present

## 2021-05-17 LAB — CBC WITH DIFFERENTIAL/PLATELET
Abs Immature Granulocytes: 0.08 10*3/uL — ABNORMAL HIGH (ref 0.00–0.07)
Basophils Absolute: 0 10*3/uL (ref 0.0–0.1)
Basophils Relative: 0 %
Eosinophils Absolute: 0 10*3/uL (ref 0.0–0.5)
Eosinophils Relative: 0 %
HCT: 40.7 % (ref 39.0–52.0)
Hemoglobin: 13.8 g/dL (ref 13.0–17.0)
Immature Granulocytes: 1 %
Lymphocytes Relative: 11 %
Lymphs Abs: 1.7 10*3/uL (ref 0.7–4.0)
MCH: 29.1 pg (ref 26.0–34.0)
MCHC: 33.9 g/dL (ref 30.0–36.0)
MCV: 85.7 fL (ref 80.0–100.0)
Monocytes Absolute: 1 10*3/uL (ref 0.1–1.0)
Monocytes Relative: 7 %
Neutro Abs: 12.8 10*3/uL — ABNORMAL HIGH (ref 1.7–7.7)
Neutrophils Relative %: 81 %
Platelets: 176 10*3/uL (ref 150–400)
RBC: 4.75 MIL/uL (ref 4.22–5.81)
RDW: 13.5 % (ref 11.5–15.5)
WBC: 15.6 10*3/uL — ABNORMAL HIGH (ref 4.0–10.5)
nRBC: 0 % (ref 0.0–0.2)

## 2021-05-17 LAB — URINE CULTURE: Culture: >=100000 — AB

## 2021-05-17 LAB — PROCALCITONIN: Procalcitonin: 0.35 ng/mL

## 2021-05-17 LAB — COMPREHENSIVE METABOLIC PANEL
ALT: 16 U/L (ref 0–44)
AST: 18 U/L (ref 15–41)
Albumin: 2.8 g/dL — ABNORMAL LOW (ref 3.5–5.0)
Alkaline Phosphatase: 76 U/L (ref 38–126)
Anion gap: 7 (ref 5–15)
BUN: 17 mg/dL (ref 8–23)
CO2: 30 mmol/L (ref 22–32)
Calcium: 9.1 mg/dL (ref 8.9–10.3)
Chloride: 104 mmol/L (ref 98–111)
Creatinine, Ser: 0.69 mg/dL (ref 0.61–1.24)
GFR, Estimated: >60 mL/min (ref >60)
Glucose, Bld: 103 mg/dL — ABNORMAL HIGH (ref 70–99)
Potassium: 3.5 mmol/L (ref 3.5–5.1)
Sodium: 141 mmol/L (ref 135–145)
Total Bilirubin: 1.2 mg/dL (ref 0.3–1.2)
Total Protein: 6 g/dL — ABNORMAL LOW (ref 6.5–8.1)

## 2021-05-17 LAB — GLUCOSE, CAPILLARY
Glucose-Capillary: 115 mg/dL — ABNORMAL HIGH (ref 70–99)
Glucose-Capillary: 162 mg/dL — ABNORMAL HIGH (ref 70–99)
Glucose-Capillary: 236 mg/dL — ABNORMAL HIGH (ref 70–99)
Glucose-Capillary: 142 mg/dL — ABNORMAL HIGH (ref 70–99)

## 2021-05-17 LAB — MAGNESIUM: Magnesium: 2.2 mg/dL (ref 1.7–2.4)

## 2021-05-17 LAB — BRAIN NATRIURETIC PEPTIDE: B Natriuretic Peptide: 596.6 pg/mL — ABNORMAL HIGH (ref 0.0–100.0)

## 2021-05-17 MED ORDER — HYDRALAZINE HCL 20 MG/ML IJ SOLN
10.0000 mg | Freq: Four times a day (QID) | INTRAMUSCULAR | Status: DC | PRN
  Administered 2021-05-17: 10 mg via INTRAVENOUS
  Filled 2021-05-17: qty 1

## 2021-05-17 MED ORDER — QUETIAPINE FUMARATE 25 MG PO TABS: 25.0000 mg | ORAL_TABLET | Freq: Two times a day (BID) | ORAL | Status: DC

## 2021-05-17 MED ORDER — HALOPERIDOL LACTATE 5 MG/ML IJ SOLN
2.0000 mg | Freq: Once | INTRAMUSCULAR | Status: AC
  Administered 2021-05-17: 2 mg via INTRAVENOUS
  Filled 2021-05-17: qty 1

## 2021-05-17 MED ORDER — LACTATED RINGERS IV SOLN: INTRAVENOUS | Status: DC

## 2021-05-17 MED ORDER — QUETIAPINE FUMARATE 50 MG PO TABS
50.0000 mg | ORAL_TABLET | Freq: Two times a day (BID) | ORAL | Status: DC
  Administered 2021-05-17 – 2021-05-23 (×9): 50 mg via ORAL
  Filled 2021-05-17 (×12): qty 1

## 2021-05-17 MED ORDER — HALOPERIDOL LACTATE 5 MG/ML IJ SOLN: 2.0000 mg | Freq: Four times a day (QID) | INTRAMUSCULAR | Status: DC | PRN

## 2021-05-17 MED ORDER — INSULIN ASPART PROT & ASPART (70-30 MIX) 100 UNIT/ML ~~LOC~~ SUSP
10.0000 [IU] | Freq: Two times a day (BID) | SUBCUTANEOUS | Status: DC
  Administered 2021-05-17 – 2021-05-23 (×12): 10 [IU] via SUBCUTANEOUS
  Filled 2021-05-17: qty 10

## 2021-05-17 MED ORDER — POTASSIUM CHLORIDE CRYS ER 20 MEQ PO TBCR
40.0000 meq | EXTENDED_RELEASE_TABLET | Freq: Once | ORAL | Status: AC
  Administered 2021-05-17: 40 meq via ORAL
  Filled 2021-05-17 (×2): qty 2

## 2021-05-17 NOTE — TOC Initial Note (Signed)
Transition of Care [facility]) - Initial/Assessment Note    Patient Details  Name: Victor Fox MRN: [id number] Date of Birth: 12-13-1945  Transition of [facility]) CM/SW Contact:    Geralynn Ochs, LCSW Phone Number: 05/17/2021, 2:47 PM  Clinical Narrative:           CSW received a call back today from [name] with APS to discuss the patient. APS has been involved with the patient since last admission, they are pursuing guardianship over the patient. Per APS, the patient does have two daughters that are estranged and have not spoken with the patient since 2014. He also may have a brother in [place], but he does not have a relationship with him, either. APS requested medical records, CSW sent records requested. Patient is still his own decision maker at this time, but not oriented enough to make decisions presently. CSW confirmed with [place] that they will accept the patient back whenever he is stable, he will need a new insurance authorization. CSW to continue to follow.         Expected Discharge Plan: Skilled Nursing Facility Barriers to Discharge: Continued Medical Work up, [facility] will not accept until restraint criteria met   Patient Goals and CMS Choice Patient states their goals for this hospitalization and ongoing recovery are:: patient unable to participate in goal setting at this time, not fully oriented      Expected Discharge Plan and Services Expected Discharge Plan: [place] Choice: [place] arrangements for the past 2 months: Single Family Home Expected Discharge Date:  (unknown)                                    Prior Living Arrangements/Services Living arrangements for the past 2 months: Single Family Home Lives with:: Self Patient language and need for interpreter reviewed:: No Do you feel safe going back to the place where you live?: Yes      Need for Family  Participation in Patient Care: Yes (Comment) Care giver support system in place?: No (comment)   Criminal Activity/Legal Involvement Pertinent to Current Situation/Hospitalization: No - Comment as needed  Activities of Daily Living Home Assistive Devices/Equipment: CBG Meter, Eyeglasses, Walker (specify type), Grab bars around toilet, Grab bars in shower, Hand-held shower hose ADL Screening (condition at time of admission) Patient's cognitive ability adequate to safely complete daily activities?: Yes Is the patient deaf or have difficulty hearing?: No Does the patient have difficulty seeing, even when wearing glasses/contacts?: No Does the patient have difficulty concentrating, remembering, or making decisions?: Yes Patient able to express need for assistance with ADLs?: Yes Does the patient have difficulty dressing or bathing?: Yes Independently performs ADLs?: No Communication: Independent Dressing (OT): Needs assistance Is this a change from baseline?: Pre-admission baseline Grooming: Needs assistance Is this a change from baseline?: Pre-admission baseline Feeding: Needs assistance Is this a change from baseline?: Pre-admission baseline Bathing: Needs assistance Is this a change from baseline?: Pre-admission baseline Toileting: Needs assistance Is this a change from baseline?: Pre-admission baseline In/Out Bed: Needs assistance Is this a change from baseline?: Pre-admission baseline Walks in Home: Needs assistance Is this a change from baseline?: Pre-admission baseline Does the patient have difficulty walking or climbing stairs?: Yes (secondary to peripheral neuropathy and weakness) Weakness of Legs: Both Weakness of Arms/Hands: None  Permission Sought/Granted Permission sought  to share information with : Facility Sport and exercise psychologist, Family Supports Permission granted to share information with : Yes, Verbal Permission Granted  Share Information with NAME:  [name]  Permission granted to share info w AGENCY: SNF  Permission granted to share info w Relationship: Friend     Emotional Assessment Appearance:: Appears stated age Attitude/Demeanor/Rapport: Unable to Assess Affect (typically observed): Unable to Assess Orientation: : Oriented to Self Alcohol / Substance Use: Not Applicable Psych Involvement: Yes (comment)  Admission diagnosis:  Stroke [facility]) [I63.9] Fall, initial encounter [W19.XXXA] Laceration of forehead, initial encounter [S01.81XA] Cerebrovascular accident (CVA), unspecified mechanism ([place]) [I63.9] Patient Active Problem List   Diagnosis Date Noted   Acute stroke due to occlusion of left posterior cerebral artery ([place]) 05/13/2021   Mixed diabetic hyperlipidemia associated with type 2 diabetes mellitus ([place]) 05/13/2021   Cognitive impairment    Chronic diastolic CHF (congestive heart failure) (HCC)    Lab test positive for detection of COVID-19 virus 04/24/2021   Self-care deficit 04/19/2021   Near syncope 81/82/9937   Acute metabolic encephalopathy 16/96/7893   Hypertensive urgency 10/19/2019   Acute encephalopathy 10/19/2019   Pain due to onychomycosis of toenails of both feet 02/21/2019   Adenomatous polyp 11/05/2018   Blood pressure elevated 11/05/2018   Neuropathy 11/05/2018   Pneumococcal vaccination given 11/05/2018   Pulmonary nodules 10/08/2018   Coronary artery disease 10/08/2018   CAP (community acquired pneumonia) 09/01/2018   Overweight (BMI 25.0-29.9) 03/10/2018   Ataxia, 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   Benign neoplasm 01/19/2018   Onychomycosis due to dermatophyte 01/19/2018   Mononeuritis 01/19/2018   Neck pain 01/19/2018   Vitamin D deficiency 01/19/2018   Aortic valve stenosis 01/05/2018   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 01/05/2018   Dizziness 12/17/2017   Type 2 diabetes mellitus with diabetic polyneuropathy, with long-term current use of insulin ([place])  12/17/2017   Hyperlipidemia 04/14/2016   Pain and swelling of left wrist 07/10/2015   Carotid artery disease ([place])    Heart murmur 10/03/2013   Essential hypertension 10/03/2013   Acute CVA (cerebrovascular accident) ([place]) 10/03/2013   Carotid artery stenosis 10/03/2013   PCP:  Patient, No Pcp Per (Inactive) Pharmacy:   [facility] [address] [place] [address] Phone: [phone] Fax: [phone]     Social Determinants of Health (SDOH) Interventions    Readmission Risk Interventions No flowsheet data found.

## 2021-05-17 NOTE — Consult Note (Signed)
Reason for consult: Severe sustained agitation, violent towards staff, refusing meds,?  Capacity Consult placed by: Thurnell Lose, MD   Attempted to interview the patient today.  However, when I got there patient was verbally abusive telling me to get out of his room and not cooperative.  He continually turned away in bed from me as I try to talk to him, when I would walk to the other side of the bed he would then turn away again.  Attempted multiple different approaches to get him to talk however he refused to speak and simply restated that he did not want to be interviewed.  At this point it does appear that these issues are behavioral and that he is knowingly acting this way.  At this point in time cannot do a formal capacity assessment as he refuses to participate.  I would still recommend an increase in his Seroquel as that can help with agitation.  Given his recent independence to now having almost complete dependency on others most likely is the driving force behind his behavioral issues.  If patient becomes agreeable to interview would be more than happy to see him again.   Recommendations: -Increase Seroquel to 50 mg BID -Reconsult if patient becomes agreeable to it  Thank you for allowing [place] to take part of this patients care.   Fatima Sanger MD Resident

## 2021-05-17 NOTE — Plan of Care (Signed)
  Problem: Safety: Goal: Non-violent Restraint(s) Outcome: Not Progressing   Problem: Safety: Goal: Violent Restraint(s) Outcome: Not Progressing   Problem: Education: Goal: Knowledge of disease or condition will improve Outcome: Not Progressing Goal: Knowledge of secondary prevention will improve Outcome: Not Progressing Goal: Knowledge of patient specific risk factors addressed and post discharge goals established will improve Outcome: Not Progressing Goal: Individualized Educational Video(s) Outcome: Not Progressing   Problem: Coping: Goal: Will verbalize positive feelings about self Outcome: Not Progressing Goal: Will identify appropriate support needs Outcome: Not Progressing   Problem: Health Behavior/Discharge Planning: Goal: Ability to manage health-related needs will improve Outcome: Not Progressing   Problem: Ischemic Stroke/TIA Tissue Perfusion: Goal: Complications of ischemic stroke/TIA will be minimized Outcome: Not Progressing

## 2021-05-17 NOTE — Progress Notes (Addendum)
Physical Therapy Treatment Patient Details Name: Victor Fox MRN: [id number] DOB: 06-01-1946 Today's Date: 05/17/2021   History of Present Illness Victor Fox is a 75 y.o. male recently discharged to a SNF 05/09/21.  Returns 05/12/21 after a fall at the skilled nursing facility and CT head revealing a moderate size left cerebellar hypodensity concerning for a subacute stroke. Waxing/waning agitation during admission, of note pt with APS guardianship case pending. PMH: Ischemic stroke involving the right frontal lobe and left medulla on 04/18/2021, with concomitant incidental COVID-19 infection, hypertension, hyperlipidemia, moderate to severe aortic stenosis, diabetes, hypertension, hyperlipidemia, tobacco abuse.    PT Comments    Pt received in supine, pt c/o wrist restraints and agreeable to bed mobility for cleanup and supine UE/LE exercises if restraints removed. Pt with fair tolerance and participation in bed-level session, limited due to c/o dizziness with noted bilateral nystagmus in supine and with rolling so defer OOB mobility. Pt needing minA to perform rolling for peri-care and min/modA for posterior supine scooting toward [facility] with transfer pad assist. Pt defers EOB/OOB due to dizziness and requesting to eat/drink, RN notified. Pt continues to benefit from PT services to progress toward functional mobility goals. Pt would benefit from vestibular PT assessment/eval next session due to nystagmus/dizziness.   Recommendations for follow up therapy are one component of a multi-disciplinary discharge planning process, led by the attending physician.  Recommendations may be updated based on patient status, additional functional criteria and insurance authorization.  Follow Up Recommendations  SNF;Supervision/Assistance - 24 hour     Equipment Recommendations  Other (comment) (TBA)    Recommendations for Other Services       Precautions / Restrictions Precautions Precautions:  Fall Precaution Comments: waxing/waning agitation, 4-pt restraints 9/16, R HB tremors, dizzy with nystagmus (needs vestibular consult) Restrictions Weight Bearing Restrictions: No     Mobility  Bed Mobility Overal bed mobility: Needs Assistance Bed Mobility: Rolling Rolling: Min assist;+2 for safety/equipment    General bed mobility comments: single step cues for sequencing/safety with rolling, pt agreeble to cooperate prior to removing wrist restraints; at times needs tactile cues    Transfers      General transfer comment: defer for safety, pt c/o dizziness with significant nystagmus observed with bed mobility and irritable with increased agitation waxing/waning during session (although pt cooperating better with PTA than with RN who was also present)    Modified Rankin (Stroke Patients Only) Modified Rankin (Stroke Patients Only) Pre-Morbid Rankin Score: Moderately severe disability (pt came from SNF) Modified Rankin: Severe disability     Balance Overall balance assessment: Needs assistance;History of Falls     Sitting balance - Comments: defer - increased agitation today         Cognition Arousal/Alertness: Awake/alert Behavior During Therapy: Restless;Flat affect Overall Cognitive Status: No family/caregiver present to determine baseline cognitive functioning Area of Impairment: Orientation;Attention;Following commands;Problem solving;Safety/judgement;Awareness;Memory      Orientation Level: Disoriented to;Time;Situation;Place Current Attention Level: Focused Memory: Decreased short-term memory;Decreased recall of precautions Following Commands: Follows one step commands with increased time;Follows multi-step commands inconsistently Safety/Judgement: Decreased awareness of safety;Decreased awareness of deficits Awareness: Intellectual Problem Solving: Requires verbal cues;Requires tactile cues;Slow processing General Comments: Per RN pt had been combative just  before PTA entering room, but pt following 1-step instructions for bed mobility/rolling and peri-care/exercises, 4-point restraints replaced at end of session due to waxing/waning agitation and order present in system.      Exercises General Exercises - Lower Extremity Ankle Circles/Pumps: AROM;Both;10 reps;Supine Heel  Slides: AROM;Both;5 reps;Supine Hip ABduction/ADduction: AROM;Both;5 reps;Supine Hip Flexion/Marching: AROM;Both;5 reps;Supine Other Exercises Other Exercises: heel cord stretch x30 sec bilaterally (pt tending to resist, difficulty reaching neutral ankle position today) but able to perform ankle pumps with cues Other Exercises: Pt educated regarding: PT role, safety with mobility, pressure relief, will need reinforcement 2/2 cognitive deficit Other Exercises: sidelying BUE AROM: chest press/bicep curls pushing/pulling on bed rail during peri-care for UE strengthening x10 reps ea    General Comments General comments (skin integrity, edema, etc.): SpO2/HR WNL on RA      Pertinent Vitals/Pain Pain Assessment: No/denies pain Faces Pain Scale: No hurt     PT Goals (current goals can now be found in the care plan section) Acute Rehab PT Goals Patient Stated Goal: "I want some water." PT Goal Formulation: Patient unable to participate in goal setting Time For Goal Achievement: 05/28/21 Potential to Achieve Goals: Fair Progress towards PT goals: Progressing toward goals (slow progress 2/2 cognition but somewhat participatory at bed-level today)    Frequency    Min 2X/week      PT Plan Current plan remains appropriate       AM-PAC PT "6 Clicks" Mobility   Outcome Measure  Help needed turning from your back to your side while in a flat bed without using bedrails?: A Little Help needed moving from lying on your back to sitting on the side of a flat bed without using bedrails?: A Lot Help needed moving to and from a bed to a chair (including a wheelchair)?:  Total Help needed standing up from a chair using your arms (e.g., wheelchair or bedside chair)?: Total Help needed to walk in hospital room?: Total Help needed climbing 3-5 steps with a railing? : Total 6 Click Score: 9    End of Session   Activity Tolerance: Other (comment);Treatment limited secondary to agitation (tx limited due to dizziness wtih + nystagmus this date, AMS and waxing/waning agitation) Patient left: in bed;with call bell/phone within reach;with bed alarm set;with nursing/sitter in room;with restraints reapplied;with SCD's reapplied Nurse Communication: Mobility status;Other (comment) (dizziness/nystagmus (RN/MD notified and vestibular PT re-eval reocmmended)) PT Visit Diagnosis: Muscle weakness (generalized) (M62.81);Difficulty in walking, not elsewhere classified (R26.2);Unsteadiness on feet (R26.81)     Time: [id number] PT Time Calculation (min) (ACUTE ONLY): 17 min  Charges:  $Therapeutic Exercise: 8-22 mins                     Dekker Verga P., PTA Acute Rehabilitation Services Pager: [phone] Office: [place] 05/17/2021, 3:30 PM
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
## 2021-05-17 NOTE — Progress Notes (Signed)
Patient ID: Victor Fox, male   DOB: 1946/05/04, 75 y.o.   MRN: [id number]       Consult received. Chart reviewed. Patient is currently unable to participate in [place] discussion. Discussed with Laveda Abbe LCSW. She states APS is attempting to obtain legal guardianship.  I attempted to call APS contact [name], without success. I was unable to leave a voicemail due to her mailbox is full.  PMT will continue to follow and attempt to contact APS.     Elie Confer, NP-C Palliative Medicine   Please call Palliative Medicine team phone with any questions [phone]. For individual providers please see AMION.   No charge

## 2021-05-17 NOTE — Progress Notes (Signed)
Speech Language Pathology Treatment: Dysphagia  Patient Details Name: Victor Fox MRN: [id number] DOB: 25-May-1946 Today's Date: 05/17/2021 Time: [id number] SLP Time Calculation (min) (ACUTE ONLY): 13 min  Assessment / Plan / Recommendation Clinical Impression  Pt seen for follow-up dysphagia treatment pleasant and eager to consume POs. NTL via small cup sips resulted in x1 overt cough across x6 trials. He required min-mod cues for small sips throughout. He self fed a whole container of puree and bites of regular textured solid which resulted in x1 overt cough post bite of regular solid and suspect possible impact of pharyngeal residue. Dys 1, NTL remains safest diet at this time per clinical observation/pt presentation. Instrumental assessment to further assess swallow physiology is still warranted given recent CVA and continued s/sx of dysphagia at bedside. Pt agreed to completion of instrumental assessment and will f/u early next week regarding this. SLP to f/u.    HPI HPI: 75 y.o. male Covid Positive this admit with a past medical history of significant diabetes mellitus type 2, hypertension, hyperlipidemia, neuropathy, vertigo, carotid artery disease, coronary artery disease, history of CVA, former smoker, who presented to ED with complaint of dizziness. He is a very poor historian and unable to provide much more detail. Currently mildly dizzy without focal neurologic deficits. 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 Unsure of pt's baseline Cognitive status.  Being followed by PT/OT this admit.. Chest x-ray also negative for any acute disease at admit.      SLP Plan  Continue with current plan of care      Recommendations for follow up therapy are one component of a multi-disciplinary discharge planning process, led by the attending physician.  Recommendations may be updated based on patient  status, additional functional criteria and insurance authorization.    Recommendations  Diet recommendations: Dysphagia 1 (puree);Nectar-thick liquid Liquids provided via: Cup;Straw Medication Administration: Whole meds with puree Supervision: Patient able to self feed;Full supervision/cueing for compensatory strategies Compensations: Minimize environmental distractions;Slow rate;Small sips/bites;Follow solids with liquid Postural Changes and/or Swallow Maneuvers: Seated upright 90 degrees                Oral Care Recommendations: Oral care BID;Staff/trained caregiver to provide oral care Follow up Recommendations: Skilled Nursing facility SLP Visit Diagnosis: Dysphagia, unspecified (R13.10) Plan: Continue with current plan of care       [place], [place], [place] Office Number: [phone]   Acie Fredrickson  05/17/2021, 4:14 PM

## 2021-05-17 NOTE — Care Management Important Message (Signed)
Important Message  Patient Details  Name: Victor Fox MRN: [id number] Date of Birth: [birth year]   Medicare Important Message Given:  Yes     Orbie Pyo 05/17/2021, 3:06 PM
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
## 2021-05-18 DIAGNOSIS — I63532 Cerebral infarction due to unspecified occlusion or stenosis of left posterior cerebral artery: Secondary | ICD-10-CM | POA: Diagnosis not present

## 2021-05-18 LAB — GLUCOSE, CAPILLARY
Glucose-Capillary: 149 mg/dL — ABNORMAL HIGH (ref 70–99)
Glucose-Capillary: 139 mg/dL — ABNORMAL HIGH (ref 70–99)
Glucose-Capillary: 254 mg/dL — ABNORMAL HIGH (ref 70–99)
Glucose-Capillary: 103 mg/dL — ABNORMAL HIGH (ref 70–99)

## 2021-05-18 NOTE — Progress Notes (Signed)
Pt refusing all meds this morning.
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
## 2021-05-18 NOTE — Progress Notes (Signed)
Pt alert to person, place and time. Pt disoriented to exact location (knew he was in a hospital) and situation. Pt clearly voiced his wishes to not be resuscitated or intubated. He voiced that he did not wish to receive, CPR or any other life saving measures. When asked why he said "because I simply don't want to".

## 2021-05-19 DIAGNOSIS — R451 Restlessness and agitation: Secondary | ICD-10-CM | POA: Diagnosis not present

## 2021-05-19 DIAGNOSIS — I5032 Chronic diastolic (congestive) heart failure: Secondary | ICD-10-CM | POA: Diagnosis not present

## 2021-05-19 DIAGNOSIS — W19XXXA Unspecified fall, initial encounter: Secondary | ICD-10-CM | POA: Diagnosis not present

## 2021-05-19 DIAGNOSIS — I63532 Cerebral infarction due to unspecified occlusion or stenosis of left posterior cerebral artery: Secondary | ICD-10-CM | POA: Diagnosis not present

## 2021-05-19 LAB — CBC
HCT: 39.1 % (ref 39.0–52.0)
Hemoglobin: 13.2 g/dL (ref 13.0–17.0)
MCH: 28.7 pg (ref 26.0–34.0)
MCHC: 33.8 g/dL (ref 30.0–36.0)
MCV: 85 fL (ref 80.0–100.0)
Platelets: 199 10*3/uL (ref 150–400)
RBC: 4.6 MIL/uL (ref 4.22–5.81)
RDW: 13.3 % (ref 11.5–15.5)
WBC: 8.2 10*3/uL (ref 4.0–10.5)
nRBC: 0 % (ref 0.0–0.2)

## 2021-05-19 LAB — BASIC METABOLIC PANEL
Anion gap: 7 (ref 5–15)
BUN: 12 mg/dL (ref 8–23)
CO2: 30 mmol/L (ref 22–32)
Calcium: 8.8 mg/dL — ABNORMAL LOW (ref 8.9–10.3)
Chloride: 101 mmol/L (ref 98–111)
Creatinine, Ser: 0.72 mg/dL (ref 0.61–1.24)
GFR, Estimated: >60 mL/min (ref >60)
Glucose, Bld: 110 mg/dL — ABNORMAL HIGH (ref 70–99)
Potassium: 3.7 mmol/L (ref 3.5–5.1)
Sodium: 138 mmol/L (ref 135–145)

## 2021-05-19 LAB — GLUCOSE, CAPILLARY
Glucose-Capillary: 139 mg/dL — ABNORMAL HIGH (ref 70–99)
Glucose-Capillary: 279 mg/dL — ABNORMAL HIGH (ref 70–99)
Glucose-Capillary: 214 mg/dL — ABNORMAL HIGH (ref 70–99)

## 2021-05-19 LAB — MAGNESIUM: Magnesium: 2 mg/dL (ref 1.7–2.4)

## 2021-05-19 NOTE — Progress Notes (Signed)
Daily Progress Note   Patient Name: Victor Fox       Date: 05/19/2021 DOB: Aug 08, 1946  Age: 75 y.o. MRN#: [id number] Attending Physician: Thurnell Lose, MD Primary Care Physician: Patient, No Pcp Per (Inactive) Admit Date: 05/12/2021  Reason for Consultation/Follow-up: goals of care  Subjective: Patient is awake and alert. When I asked how he is doing, he states "Not so good".  He is oriented to person, place, and situation. He expresses frustration about being in restraints. I told him he is in restraints because he is an extreme fall risk and has been intermittently combative toward staff. Discsussed that the restraints are for patient and staff safety.    I spent time reviewing with him his current hospital course and previous hospitalization at [facility]. I told him that a referral to APS had made 04/17/21 due to concern for his ability to care for himself at home. I told him the process has been started to possibly have APS obtain legal guardianship. Patient states he wants to be able to make his own decisions. I explained that psychiatry attempted to evaluate him on 9/16 and he refused to speak with them. He states he is willing to speak with them now.   We did discuss code status. Patient does not wish to receive resuscitation measures in the event of cardiopulmonary arrest. He does not want CPR, intubation, defibrillation, or ACLS medications. He is agreeable to DNR/DNI status.   I spoke with bedside RN regarding a trial out of restraints. He reports patient was agitated earlier this morning and he is not comfortable with taking him out of restraints today due to limited staff resources.    Length of Stay: 6  Current Medications: Scheduled Meds:  .  stroke: mapping our early stages of  recovery book   Does not apply Once  . amLODipine  10 mg Oral Daily  . aspirin EC  81 mg Oral Daily  . atorvastatin  80 mg Oral QHS  . clopidogrel  75 mg Oral Daily  . enalapril  10 mg Oral Daily  . enoxaparin (LOVENOX) injection  40 mg Subcutaneous Q24H  . insulin aspart  0-15 Units Subcutaneous TID AC & HS  . insulin aspart protamine- aspart  10 Units Subcutaneous BID AC  . QUEtiapine  50 mg Oral BID  PRN Meds: acetaminophen **OR** acetaminophen, haloperidol lactate **OR** haloperidol lactate, hydrALAZINE, [DISCONTINUED] ondansetron **OR** ondansetron (ZOFRAN) IV, polyethylene glycol  Physical Exam Vitals reviewed.  Constitutional:      General: He is not in acute distress.    Comments: Chronically ill-appearing  Pulmonary:     Effort: Pulmonary effort is normal.  Neurological:     Mental Status: He is alert.     Comments: Oriented to person, place, situation  Psychiatric:        Mood and Affect: Affect is tearful.            Vital Signs: BP (!) 186/77 (BP Location: Right Arm)   Pulse 77   Temp 98.4 F (36.9 C) (Axillary)   Resp 18   Wt 81.2 kg   SpO2 96%   BMI 24.28 kg/m  SpO2: SpO2: 96 % O2 Device: O2 Device: Room Air O2 Flow Rate:    Intake/output summary:  Intake/Output Summary (Last 24 hours) at 05/19/2021 1315 Last data filed at 05/19/2021 [id number] Gross per 24 hour  Intake --  Output 800 ml  Net -800 ml   LBM: Last BM Date:  (pta) Baseline Weight: Weight: 81.2 kg Most recent weight: Weight: 81.2 kg       Palliative Assessment/Data: PPS 40%       Palliative Care Assessment & Plan   HPI/Patient Profile: 75 y.o. male  with past medical history of chronic diastolic heart failure (echo 04/2021 EF 60-65% with G1DD), moderate to severe aortic stenosis, multiple strokes in the past, coronary artery disease, diabetes type 2 with diabetic polyneuropathy, hypertension, and hyperlipidemia who presented to [facility] emergency department 05/12/2021 status post fall at  SNF. He reported having sudden dizziness and weakness while ambulating down the hallway, causing him to fall to the floor. CT head revealing moderate hypodensity of the left cerebellum concerning for an acute/subacute infarct that was new compared to imaging done during his recent hospitalization. He was admitted to [facility] with acute CVA.    Of note, patient was hospitalized at [facility] from 8/17 until 9/8.  He was found to have multiple small infarcts of the left medullary pyramid and superior right frontal lobe. Hospitalization was complicated by incidental COVID-19 infection. Patient was eventually discharged to [facility] skilled nursing facility on 9/8.  Assessment: - recurrent acute to subacute strokes - severe metabolic encephalopathy - agitation - CAD - moderate to severe aortic stenosis, not a candidate for TAVR or TEE - diabetes, uncontrolled   Recommendations/Plan: Patient has periods of being lucid where his thoughts seem to be logical Recommend psych re-attempt to evaluate patient - This NP would be willing to be present for support Would recommend DNR/DNI status if patient deemed appropriate to make medical decisions   Goals of Care and Additional Recommendations: Limitations on Scope of Treatment: Full Scope Treatment  Code Status: Full   Prognosis:  Unable to determine  Discharge Planning: To Be Determined    Thank you for allowing the Palliative Medicine Team to assist in the care of this patient.   Total Time 35 minutes Prolonged Time Billed  no       Greater than 50%  of this time was spent counseling and coordinating care related to the above assessment and plan.  Lavena Bullion, NP  Please contact Palliative Medicine Team phone at [phone] for questions and concerns.
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
## 2021-05-19 NOTE — Progress Notes (Signed)
Patient has refused enoxaparin for VTE prophylaxis x 2 days. Dr. Candiss Norse informed.   Noeh Sparacino A. Levada Dy, PharmD, BCPS, FNKF Clinical Pharmacist [place] Please utilize Amion for appropriate phone number to reach the unit pharmacist ([place])

## 2021-05-20 ENCOUNTER — Inpatient Hospital Stay (HOSPITAL_COMMUNITY): Payer: Medicare Other

## 2021-05-20 DIAGNOSIS — I63532 Cerebral infarction due to unspecified occlusion or stenosis of left posterior cerebral artery: Secondary | ICD-10-CM | POA: Diagnosis not present

## 2021-05-20 LAB — GLUCOSE, CAPILLARY
Glucose-Capillary: 220 mg/dL — ABNORMAL HIGH (ref 70–99)
Glucose-Capillary: 191 mg/dL — ABNORMAL HIGH (ref 70–99)
Glucose-Capillary: 208 mg/dL — ABNORMAL HIGH (ref 70–99)
Glucose-Capillary: 191 mg/dL — ABNORMAL HIGH (ref 70–99)
Glucose-Capillary: 182 mg/dL — ABNORMAL HIGH (ref 70–99)

## 2021-05-20 IMAGING — DX DG CHEST 1V PORT SAME DAY
1 series · 1 of 1 positions shown · non-contrast
Comparison: None.

CLINICAL DATA: Shortness of breath

EXAM:
PORTABLE CHEST 1 VIEW

[chest ap]
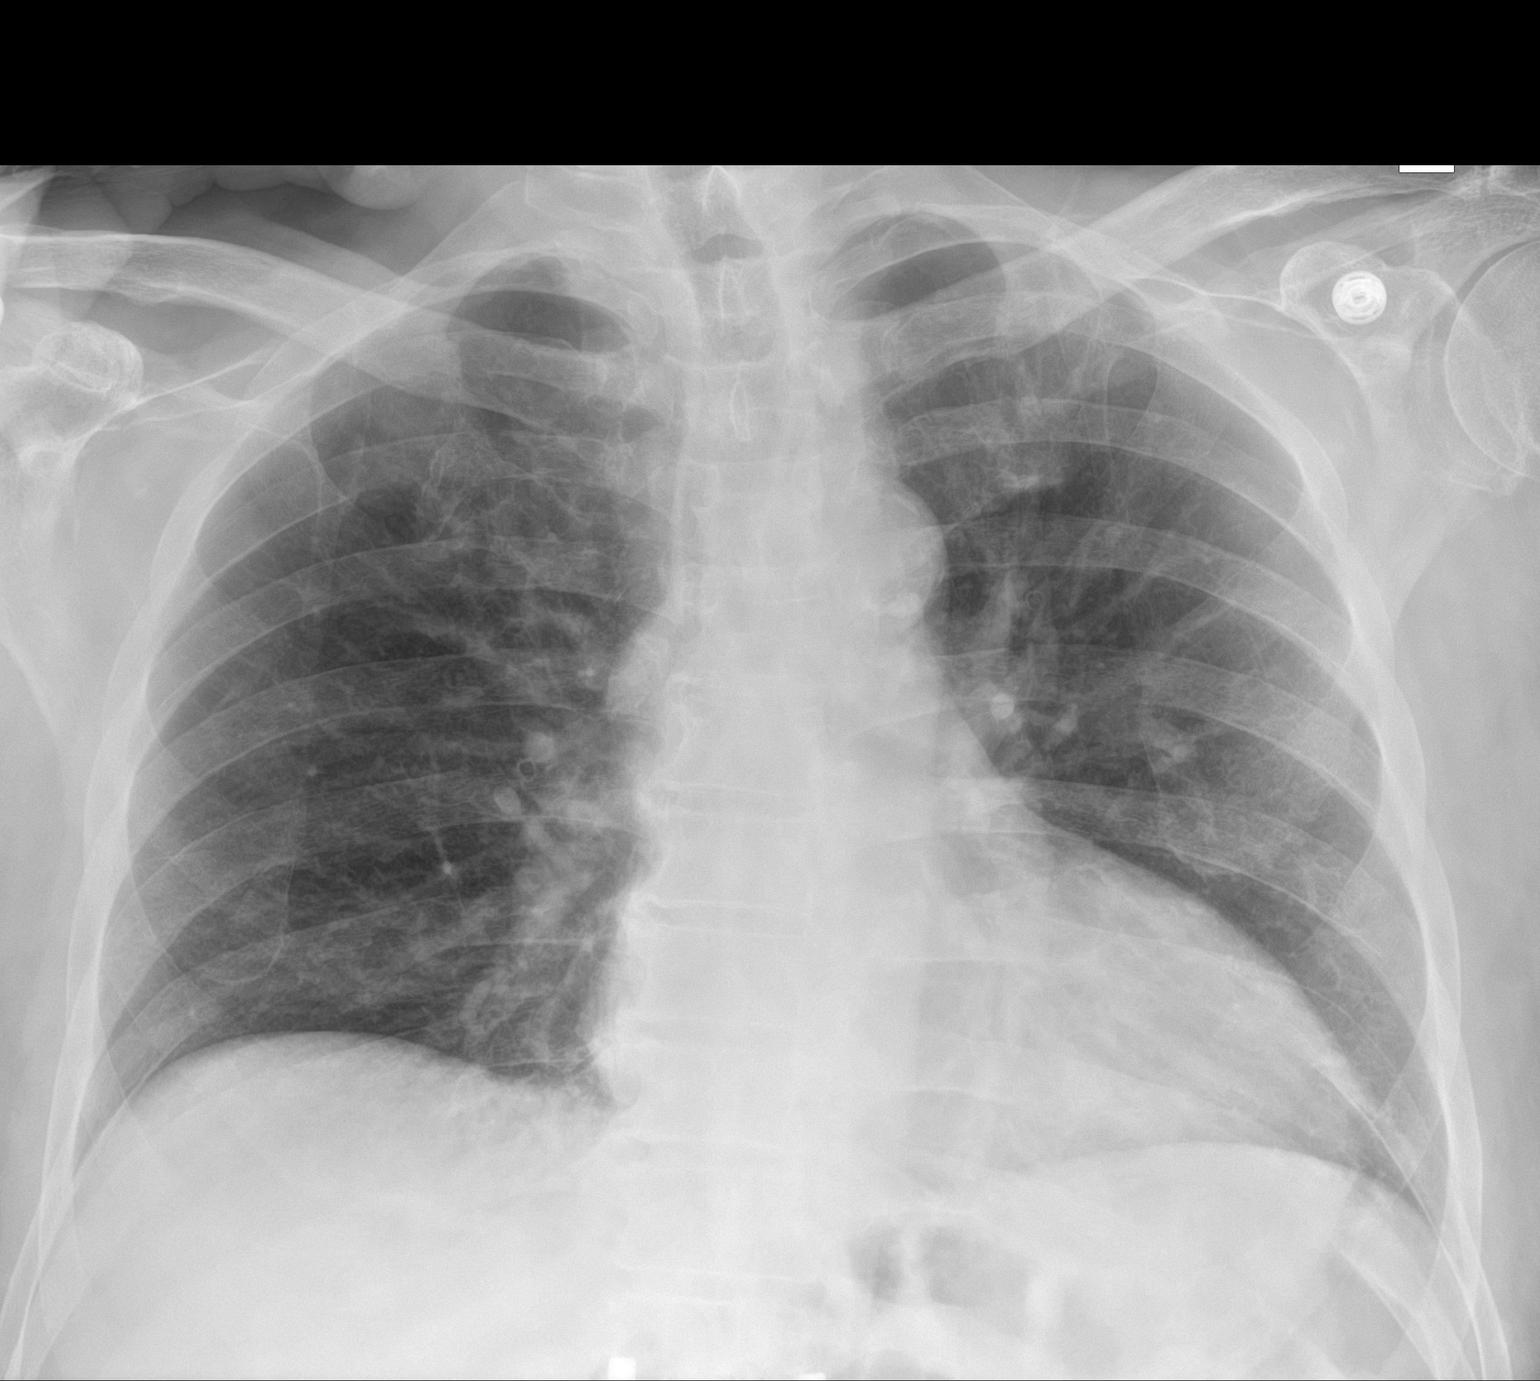

[1 of 1 positions shown; findings below may reference images not displayed]

FINDINGS: The cardiomediastinal silhouette is within normal limits. No pleural
effusion. No pneumothorax. No mass or consolidation. No acute
osseous abnormality.
IMPRESSION: No acute abnormality in the chest.

## 2021-05-20 NOTE — Progress Notes (Signed)
Pt noted to be more cooperative w/ staff and interventions. Therefore pt has been taken out of all non-violent restraints this shift. Will conti to monitor.

## 2021-05-20 NOTE — Progress Notes (Signed)
Patient was vomiting while he is sleep, he started to choke, patient turned to the side and HOB elevated, Vital signs checked WNL and MD notified, will continue to monitor.
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
## 2021-05-20 NOTE — Progress Notes (Signed)
PT Cancellation Note  Patient Details Name: Victor Fox MRN: [id number] DOB: 11/18/1945   Cancelled Treatment:    Reason Eval/Treat Not Completed: Medical issues which prohibited therapy; attempted to see pt and he was pushing me away and refusing.  Eyes open and continued direction changing nystagmus seen.  Patient began spontaneously vomiting so suctioned till he pushed me away.  RN in to assist.  Will attempt another day.   Reginia Naas 05/20/2021, 1:46 PM Magda Kiel, PT Acute Rehabilitation Services [phone] Office:[phone] 05/20/2021

## 2021-05-20 NOTE — Progress Notes (Signed)
OT Cancellation Note  Patient Details Name: Victor Fox MRN: [id number] DOB: 11-17-1945   Cancelled Treatment:    Reason Eval/Treat Not Completed: Medical issues which prohibited therapy (After initial questions, pt began throwing up. PT provided suction to pt which caused agitation and restless behavior. Pt left in bed wtih RN and NT; OT treat to cancel for now and f/u as appropriate.)  [name] A [name] 05/20/2021, 2:26 PM

## 2021-05-20 NOTE — Progress Notes (Signed)
Speech Language Pathology Treatment: Dysphagia  Patient Details Name: Victor Fox MRN: [id number] DOB: 1945/11/06 Today's Date: 05/20/2021 Time: [id number] SLP Time Calculation (min) (ACUTE ONLY): 25 min  Assessment / Plan / Recommendation Clinical Impression  Pt unwilling to participate in therapy, curses and yells with any interaction with therapist. He did grasp a cup of water and took several sips independently without signs of aspiration with SLP quietly observing. Pt would not accept upgraded trials of food and denture not present, but suspect a finely chopped diet would be tolerated well and would be more palatable to pt and increase oral intake. Will upgrade diet to dys 2/thin liquids and defer any instrumental testing given that he would be unlikely to appropriately participate.   HPI HPI: 75 y.o. male Covid Positive this admit with a past medical history of significant diabetes mellitus type 2, hypertension, hyperlipidemia, neuropathy, vertigo, carotid artery disease, coronary artery disease, history of CVA, former smoker, who presented to ED with complaint of dizziness. He is a very poor historian and unable to provide much more detail. Currently mildly dizzy without focal neurologic deficits. 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 Unsure of pt's baseline Cognitive status.  Being followed by PT/OT this admit.. Chest x-ray also negative for any acute disease at admit.      SLP Plan  Continue with current plan of care      Recommendations for follow up therapy are one component of a multi-disciplinary discharge planning process, led by the attending physician.  Recommendations may be updated based on patient status, additional functional criteria and insurance authorization.    Recommendations  Diet recommendations: Dysphagia 2 (fine chop);Thin liquid Liquids provided via:  Cup;Straw Medication Administration: Whole meds with puree Supervision: Patient able to self feed                Plan: Continue with current plan of care       GO                Katara Griner, Katherene Ponto  05/20/2021, 1:30 PM

## 2021-05-20 NOTE — Plan of Care (Signed)
  Problem: Education: Goal: Knowledge of disease or condition will improve Outcome: Progressing Goal: Knowledge of secondary prevention will improve Outcome: Progressing Goal: Knowledge of patient specific risk factors addressed and post discharge goals established will improve Outcome: Progressing   Problem: Coping: Goal: Will verbalize positive feelings about self Outcome: Progressing   Problem: Health Behavior/Discharge Planning: Goal: Ability to manage health-related needs will improve Outcome: Progressing   Problem: Ischemic Stroke/TIA Tissue Perfusion: Goal: Complications of ischemic stroke/TIA will be minimized Outcome: Progressing

## 2021-05-21 ENCOUNTER — Inpatient Hospital Stay (HOSPITAL_COMMUNITY): Payer: Medicare Other

## 2021-05-21 DIAGNOSIS — I63532 Cerebral infarction due to unspecified occlusion or stenosis of left posterior cerebral artery: Secondary | ICD-10-CM | POA: Diagnosis not present

## 2021-05-21 LAB — GLUCOSE, CAPILLARY
Glucose-Capillary: 150 mg/dL — ABNORMAL HIGH (ref 70–99)
Glucose-Capillary: 166 mg/dL — ABNORMAL HIGH (ref 70–99)
Glucose-Capillary: 153 mg/dL — ABNORMAL HIGH (ref 70–99)
Glucose-Capillary: 126 mg/dL — ABNORMAL HIGH (ref 70–99)

## 2021-05-21 IMAGING — DX DG ABD PORTABLE 1V
1 series · 1 of 1 positions shown · non-contrast
Comparison: [DATE].

CLINICAL DATA: 75-year-old male with constipation.

EXAM:
PORTABLE ABDOMEN - 1 VIEW

[abdomen supine]
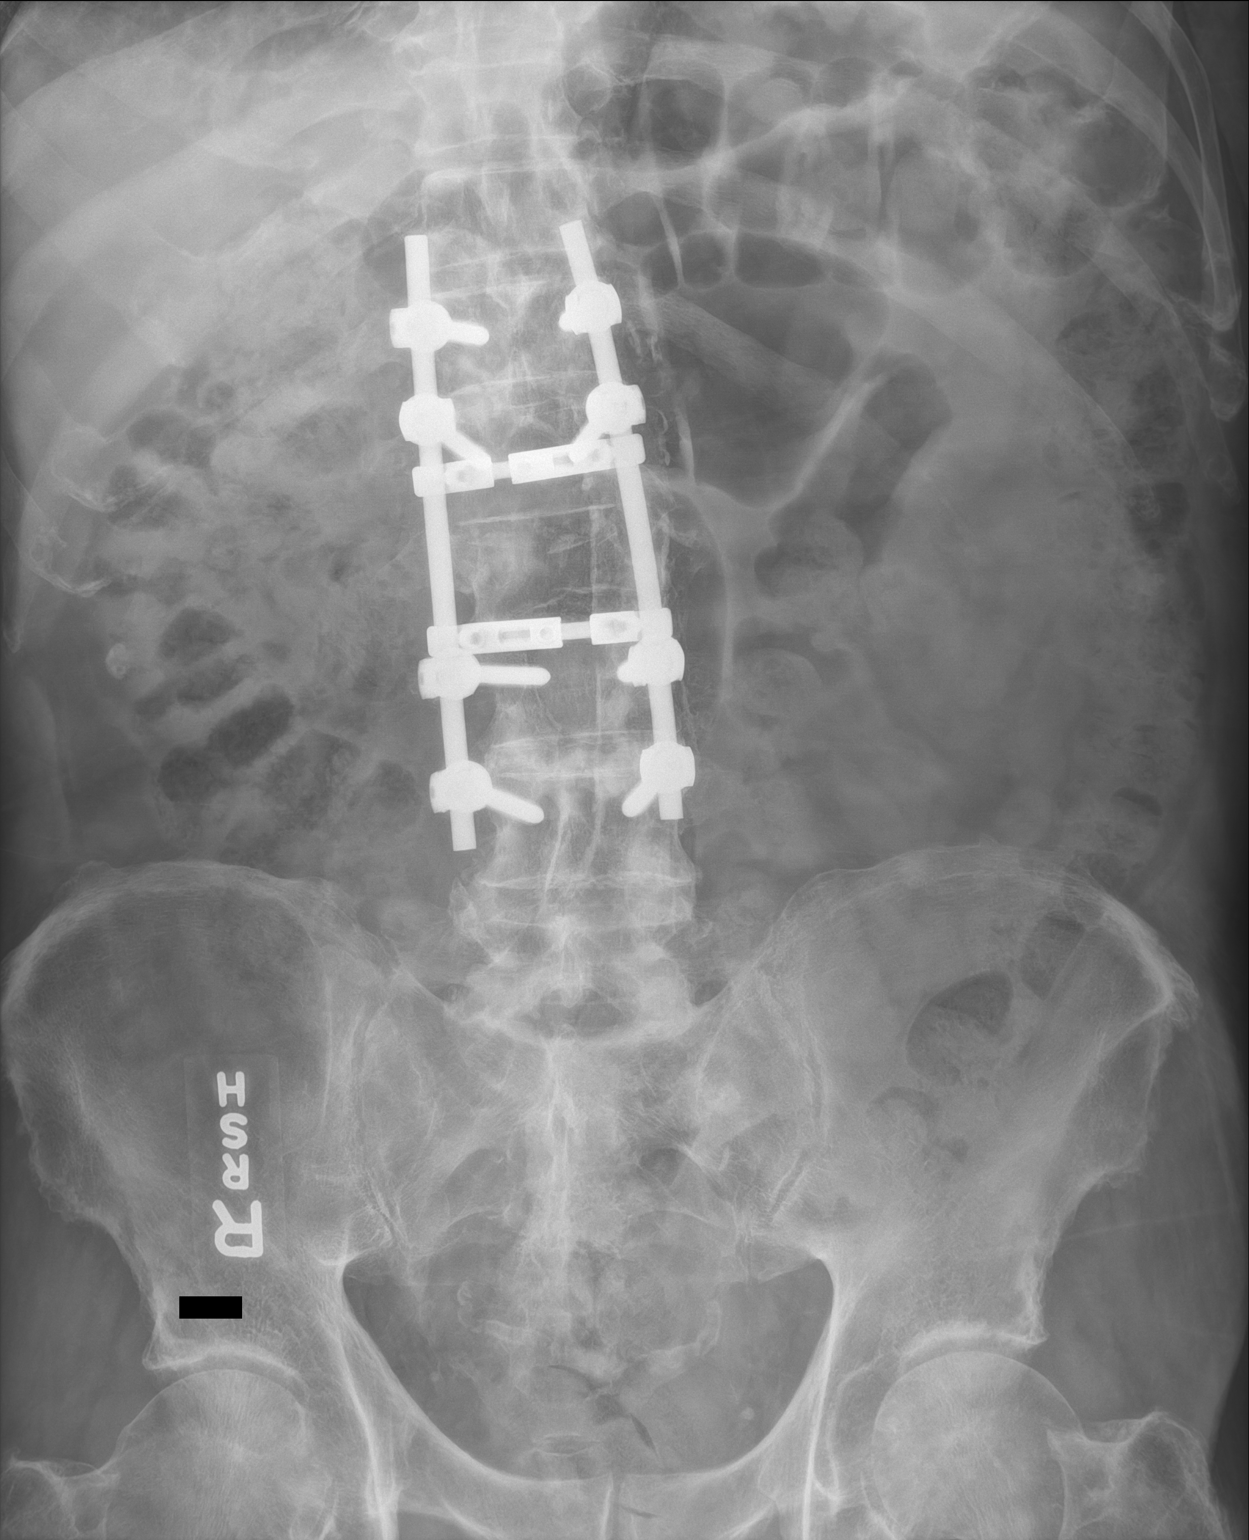

[1 of 1 positions shown; findings below may reference images not displayed]

FINDINGS: Portable AP view at [PX] hours. Non obstructed bowel gas pattern.
Moderate volume of retained stool in the large bowel does appear
increased from last month, also with less right colon gas. Chronic
lumbar fusion hardware. Aortoiliac calcified atherosclerosis. No
acute osseous abnormality identified.
IMPRESSION: 1. Nonobstructed bowel-gas pattern but moderate volume of retained
stool is increased since last month.
2.  Aortic Atherosclerosis ([PX]-[PX]).

## 2021-05-21 MED ORDER — POLYETHYLENE GLYCOL 3350 17 G PO PACK
17.0000 g | PACK | Freq: Two times a day (BID) | ORAL | Status: DC
  Administered 2021-05-21 – 2021-05-22 (×3): 17 g via ORAL
  Filled 2021-05-21 (×3): qty 1

## 2021-05-21 MED ORDER — LACTULOSE 10 GM/15ML PO SOLN
30.0000 g | Freq: Three times a day (TID) | ORAL | Status: AC
  Administered 2021-05-21 – 2021-05-22 (×5): 30 g via ORAL
  Filled 2021-05-21 (×5): qty 60

## 2021-05-21 MED ORDER — MECLIZINE HCL 12.5 MG PO TABS
25.0000 mg | ORAL_TABLET | Freq: Three times a day (TID) | ORAL | Status: DC
  Administered 2021-05-21 – 2021-05-23 (×6): 25 mg via ORAL
  Filled 2021-05-21 (×6): qty 2

## 2021-05-21 MED ORDER — DOCUSATE SODIUM 100 MG PO CAPS
100.0000 mg | ORAL_CAPSULE | Freq: Two times a day (BID) | ORAL | Status: DC
  Administered 2021-05-21 – 2021-05-22 (×3): 100 mg via ORAL
  Filled 2021-05-21 (×3): qty 1

## 2021-05-21 NOTE — Progress Notes (Signed)
Occupational Therapy Treatment Patient Details Name: Victor Fox MRN: [id number] DOB: 12-13-1945 Today's Date: 05/21/2021   History of present illness Victor Fox is a 75 y.o. male recently discharged to a SNF 05/09/21.  Returns 05/12/21 after a fall at the skilled nursing facility and CT head revealing a moderate size left cerebellar hypodensity concerning for a subacute stroke. Waxing/waning agitation during admission, of note pt with APS guardianship case pending. 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 PMH: Ischemic stroke involving the right frontal lobe and left medulla on 04/18/2021, with concomitant incidental COVID-19 infection, hypertension, hyperlipidemia, moderate to severe aortic stenosis, diabetes, hypertension, hyperlipidemia, tobacco abuse.   OT comments  Patient seen as a co-treat for patient and therapist safety.  Patient cooperated better when patient was allowed to attempt tasks. Patient was able to get to eob but began vomiting soon after.  Once patient's dizziness had subsided he performed grooming and changed gowns.  Patient attempted to donn socks but was unable to complete and refused assistance. Patient would benefit from further OT services to increase safety and independence with functional tasks.    Recommendations for follow up therapy are one component of a multi-disciplinary discharge planning process, led by the attending physician.  Recommendations may be updated based on patient status, additional functional criteria and insurance authorization.    Follow Up Recommendations  SNF    Equipment Recommendations  None recommended by OT    Recommendations for Other Services      Precautions / Restrictions Precautions Precautions: Fall Precaution Comments: waxing/waning agitation, dizzy with nystagmus Restrictions Weight Bearing Restrictions: No        Mobility Bed Mobility Overal bed mobility: Needs Assistance Bed Mobility: Rolling Rolling: Min guard   Supine to sit: +2 for safety/equipment;HOB elevated;Min guard Sit to supine: Min guard   General bed mobility comments: Patient was wanted to perform bed mobility without assitance and required increased time but was able to perform    Transfers                 General transfer comment: Pt only able to sit EOB today.    Balance Overall balance assessment: Needs assistance;History of Falls Sitting-balance support: No upper extremity supported;Feet supported;Bilateral upper extremity supported Sitting balance-Leahy Scale: Poor Sitting balance - Comments: patient required min assist for sitting balance once he began filling dizzy Postural control: Posterior lean                                 ADL either performed or assessed with clinical judgement   ADL Overall ADL's : Needs assistance/impaired     Grooming: Wash/dry hands;Wash/dry face;Set up;Bed level Grooming Details (indicate cue type and reason): washed face and hands following vomiting episode         Upper Body Dressing : Minimal assistance;Sitting Upper Body Dressing Details (indicate cue type and reason): to donn gown Lower Body Dressing: Maximal assistance;Sitting/lateral leans Lower Body Dressing Details (indicate cue type and reason): Attempted to donn socks while sitting on EOB, could not complete and refused assistance               General ADL Comments: patient performed grooming and attempted donning socks while sitting on eob due to once we go him to sit up he began vomiting. patient was willing to clean up but did not want  to move to recliner     Vision       Perception     Praxis      Cognition Arousal/Alertness: Awake/alert Behavior During Therapy: Restless;Flat affect Overall Cognitive Status: No family/caregiver present to determine baseline cognitive  functioning Area of Impairment: Orientation;Attention;Following commands;Problem solving;Safety/judgement;Awareness;Memory                 Orientation Level: Disoriented to;Time;Situation;Place Current Attention Level: Focused Memory: Decreased short-term memory;Decreased recall of precautions Following Commands: Follows one step commands with increased time;Follows multi-step commands inconsistently Safety/Judgement: Decreased awareness of safety;Decreased awareness of deficits Awareness: Intellectual Problem Solving: Requires verbal cues;Requires tactile cues;Slow processing General Comments: Pt cooperative as long as PT/OT allowed him to perform movements on his own.        Exercises     Shoulder Instructions       General Comments VSS on RA    Pertinent Vitals/ Pain       Pain Assessment: No/denies pain  Home Living                                          Prior Functioning/Environment              Frequency  Min 2X/week        Progress Toward Goals  OT Goals(current goals can now be found in the care plan section)  Progress towards OT goals: Progressing toward goals  Acute Rehab OT Goals Patient Stated Goal: "I want some water." OT Goal Formulation: With patient Time For Goal Achievement: 05/28/21 Potential to Achieve Goals: Fair ADL Goals Pt Will Perform Grooming: with modified independence;standing Pt Will Perform Lower Body Dressing: with min assist;sit to/from stand;sitting/lateral leans Pt Will Transfer to Toilet: with min assist;stand pivot transfer;bedside commode Pt Will Perform Toileting - Clothing Manipulation and hygiene: with mod assist;sitting/lateral leans;sit to/from stand Additional ADL Goal #1: Pt will Independently verbalize plan to implement x3 falls prevention strategies  Plan Discharge plan remains appropriate;Frequency remains appropriate    Co-evaluation    PT/OT/SLP Co-Evaluation/Treatment: Yes Reason  for Co-Treatment: For patient/therapist safety PT goals addressed during session: Mobility/safety with mobility OT goals addressed during session: ADL's and self-care      AM-PAC OT "6 Clicks" Daily Activity     Outcome Measure   Help from another person eating meals?: A Little Help from another person taking care of personal grooming?: A Little Help from another person toileting, which includes using toliet, bedpan, or urinal?: Total Help from another person bathing (including washing, rinsing, drying)?: A Lot Help from another person to put on and taking off regular upper body clothing?: A Little Help from another person to put on and taking off regular lower body clothing?: A Lot 6 Click Score: 14    End of Session    OT Visit Diagnosis: Other abnormalities of gait and mobility (R26.89);Muscle weakness (generalized) (M62.81);Other symptoms and signs involving the nervous system (R29.898);History of falling (Z91.81)   Activity Tolerance Other (comment) (tolerance limited due to dizziness and vomiting)   Patient Left in bed;with call bell/phone within reach;with bed alarm set   Nurse Communication Other (comment) (discussed episodes of dizziness and vomiting)        Time: 4081-4481 OT Time Calculation (min): 24 min  Charges: OT General Charges $OT Visit: 1 Visit OT Treatments $Self Care/Home Management : 8-22 mins  Lodema Hong, OTA  Domenica Weightman Alexis Goodell 05/21/2021, 12:14 PM

## 2021-05-21 NOTE — Progress Notes (Signed)
Speech Language Pathology Treatment: Dysphagia  Patient Details Name: Victor Fox MRN: [id number] DOB: 03-24-1946 Today's Date: 05/21/2021 Time: 2449-7530 SLP Time Calculation (min) (ACUTE ONLY): 10 min  Assessment / Plan / Recommendation Clinical Impression  Pt demonstrates immediate coughing with thin liquids today. RN concerned about tolerance of diet given vomiting yesterday. Nectar thick liquids tolerated with less coughing. Puree with brief oral holding, pt unable to take a pill whole in puree, though cognition is part of this problem. Unsure if pt would tolerate dys 2 texture given adverse reaction to texture of pill and lack of dentition. No PO available to attempt. Will resume purees given difficult assessment. Pt is not able to participate in objective testing. Resume texture modifications for now.   HPI HPI: 75 y.o. male Covid Positive this admit with a past medical history of significant diabetes mellitus type 2, hypertension, hyperlipidemia, neuropathy, vertigo, carotid artery disease, coronary artery disease, history of CVA, former smoker, who presented to ED with complaint of dizziness. He is a very poor historian and unable to provide much more detail. Currently mildly dizzy without focal neurologic deficits. 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 Unsure of pt's baseline Cognitive status.  Being followed by PT/OT this admit.. Chest x-ray also negative for any acute disease at admit.      SLP Plan  Continue with current plan of care      Recommendations for follow up therapy are one component of a multi-disciplinary discharge planning process, led by the attending physician.  Recommendations may be updated based on patient status, additional functional criteria and insurance authorization.    Recommendations  Diet recommendations: Dysphagia 1 (puree);Nectar-thick liquid Liquids  provided via: Cup;Straw Medication Administration: Crushed with puree Compensations: Minimize environmental distractions;Slow rate;Small sips/bites;Follow solids with liquid Postural Changes and/or Swallow Maneuvers: Seated upright 90 degrees                Plan: Continue with current plan of care       GO                Luverne Farone, Katherene Ponto  05/21/2021, 10:13 AM
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
## 2021-05-21 NOTE — Progress Notes (Signed)
Physical Therapy Treatment Patient Details Name: Victor Fox MRN: [id number] DOB: 1945-11-18 Today's Date: 05/21/2021   History of Present Illness Victor Fox is a 75 y.o. male recently discharged to a SNF 05/09/21.  Returns 05/12/21 after a fall at the skilled nursing facility and CT head revealing a moderate size left cerebellar hypodensity concerning for a subacute stroke. Waxing/waning agitation during admission, of note pt with APS guardianship case pending. 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 PMH: Ischemic stroke involving the right frontal lobe and left medulla on 04/18/2021, with concomitant incidental COVID-19 infection, hypertension, hyperlipidemia, moderate to severe aortic stenosis, diabetes, hypertension, hyperlipidemia, tobacco abuse.    PT Comments    Pt admitted with above diagnosis. Pt was able to sit EOB with +2 min guard assist but after 1 min began vomiting. Pt did at least tolerate sitting EOB today.  Discussed pt with nurse regarding his vomiting and vertigo. Messaged MD regarding possibly trying Meclizine or Antivert.  MD to order. Pt had difficulty using compensatory strategies for vertigo today as he seemed to not be able to focus on "A" once he was dizzy and nauseous.  Encouraged pt to use "A" target for compensation.  Pt currently with functional limitations due to balance and endurance deficits. Pt will benefit from skilled PT to increase their independence and safety with mobility to allow discharge to the venue listed below.      Recommendations for follow up therapy are one component of a multi-disciplinary discharge planning process, led by the attending physician.  Recommendations may be updated based on patient status, additional functional criteria and insurance authorization.  Follow Up Recommendations  SNF;Supervision/Assistance - 24 hour     Equipment  Recommendations  Other (comment) (TBA)    Recommendations for Other Services       Precautions / Restrictions Precautions Precautions: Fall Precaution Comments: waxing/waning agitation, dizzy with nystagmus Restrictions Weight Bearing Restrictions: No     Mobility  Bed Mobility Overal bed mobility: Needs Assistance Bed Mobility: Rolling Rolling: Min guard (with rail)   Supine to sit: +2 for safety/equipment;HOB elevated;Min guard (with rail) Sit to supine: Min guard   General bed mobility comments: Pt able to complete roll w/ UB, needing min verbal cuing to bend knees to assist hips in rolling. Pt wanted to sit up on his own and with incr time he was able to complete the movement. Pt cued to focus on target "A" in front of him how3ever pt had difficulty doing so once he sat up.  He sat for about a minute and stated he was going to throw up and pt vomited quite a bit. Called nurse and discussed with him. Did not nystagmus but difficult to see direction as pt kept closing eyes.  It did appear to change directions later when pt laid down.  Messaged MD to possibly try Meclizine or Antivert.    Transfers                 General transfer comment: Pt only able to sit EOB today.  Ambulation/Gait                 Stairs             Wheelchair Mobility    Modified Rankin (Stroke Patients Only) Modified Rankin (Stroke Patients Only) Pre-Morbid Rankin Score: Moderately severe disability (pt came from SNF) Modified Rankin: Severe disability  Balance Overall balance assessment: Needs assistance;History of Falls Sitting-balance support: No upper extremity supported;Feet supported;Bilateral upper extremity supported Sitting balance-Leahy Scale: Poor Sitting balance - Comments: Pt was able to sit with intermittent UE support. Pt did get sick and vomit and needing min assist to keep balance as pt was dizzy. Pt could not focus on "A" for compensation due to being  sick. Pt did attempt to put on socks at EOB but couldnt keep his balance. Postural control: Posterior lean                                  Cognition Arousal/Alertness: Awake/alert Behavior During Therapy: Restless;Flat affect Overall Cognitive Status: No family/caregiver present to determine baseline cognitive functioning Area of Impairment: Orientation;Attention;Following commands;Problem solving;Safety/judgement;Awareness;Memory                 Orientation Level: Disoriented to;Time;Situation;Place Current Attention Level: Focused Memory: Decreased short-term memory;Decreased recall of precautions Following Commands: Follows one step commands with increased time;Follows multi-step commands inconsistently Safety/Judgement: Decreased awareness of safety;Decreased awareness of deficits Awareness: Intellectual Problem Solving: Requires verbal cues;Requires tactile cues;Slow processing General Comments: Pt cooperative as long as PT/OT allowed him to perform movements on his own.      Exercises      General Comments General comments (skin integrity, edema, etc.): VSS on RA      Pertinent Vitals/Pain Pain Assessment: No/denies pain    Home Living                      Prior Function            PT Goals (current goals can now be found in the care plan section) Progress towards PT goals: Progressing toward goals    Frequency    Min 2X/week      PT Plan Current plan remains appropriate    Co-evaluation PT/OT/SLP Co-Evaluation/Treatment: Yes Reason for Co-Treatment: For patient/therapist safety PT goals addressed during session: Mobility/safety with mobility        AM-PAC PT "6 Clicks" Mobility   Outcome Measure  Help needed turning from your back to your side while in a flat bed without using bedrails?: A Little Help needed moving from lying on your back to sitting on the side of a flat bed without using bedrails?: A Little Help  needed moving to and from a bed to a chair (including a wheelchair)?: Total Help needed standing up from a chair using your arms (e.g., wheelchair or bedside chair)?: Total Help needed to walk in hospital room?: Total Help needed climbing 3-5 steps with a railing? : Total 6 Click Score: 10    End of Session   Activity Tolerance: Patient limited by fatigue (limited by dizziness) Patient left: in bed;with call bell/phone within reach;with bed alarm set Nurse Communication: Mobility status;Other (comment) (dizziness/nystagmus (RN/MD notified about possible meds for dizziness)) PT Visit Diagnosis: Muscle weakness (generalized) (M62.81);Unsteadiness on feet (R26.81)     Time: 9390-3009 PT Time Calculation (min) (ACUTE ONLY): 24 min  Charges:  $Therapeutic Activity: 8-22 mins                     Donyae Kohn M,PT Acute Rehab Services [phone] (pager)    Alvira Philips 05/21/2021, 11:44 AM

## 2021-05-22 LAB — MAGNESIUM: Magnesium: 2.3 mg/dL (ref 1.7–2.4)

## 2021-05-22 LAB — CBC
HCT: 46 % (ref 39.0–52.0)
Hemoglobin: 15.6 g/dL (ref 13.0–17.0)
MCH: 29.1 pg (ref 26.0–34.0)
MCHC: 33.9 g/dL (ref 30.0–36.0)
MCV: 85.7 fL (ref 80.0–100.0)
Platelets: 278 10*3/uL (ref 150–400)
RBC: 5.37 MIL/uL (ref 4.22–5.81)
RDW: 13.5 % (ref 11.5–15.5)
WBC: 17.7 10*3/uL — ABNORMAL HIGH (ref 4.0–10.5)
nRBC: 0 % (ref 0.0–0.2)

## 2021-05-22 LAB — BASIC METABOLIC PANEL
Anion gap: 12 (ref 5–15)
BUN: 15 mg/dL (ref 8–23)
CO2: 30 mmol/L (ref 22–32)
Calcium: 9.5 mg/dL (ref 8.9–10.3)
Chloride: 97 mmol/L — ABNORMAL LOW (ref 98–111)
Creatinine, Ser: 0.9 mg/dL (ref 0.61–1.24)
GFR, Estimated: >60 mL/min (ref >60)
Glucose, Bld: 120 mg/dL — ABNORMAL HIGH (ref 70–99)
Potassium: 3.9 mmol/L (ref 3.5–5.1)
Sodium: 139 mmol/L (ref 135–145)

## 2021-05-22 LAB — GLUCOSE, CAPILLARY
Glucose-Capillary: 102 mg/dL — ABNORMAL HIGH (ref 70–99)
Glucose-Capillary: 126 mg/dL — ABNORMAL HIGH (ref 70–99)
Glucose-Capillary: 197 mg/dL — ABNORMAL HIGH (ref 70–99)
Glucose-Capillary: 153 mg/dL — ABNORMAL HIGH (ref 70–99)

## 2021-05-22 LAB — VITAMIN B12: Vitamin B-12: 590 pg/mL (ref 180–914)

## 2021-05-22 LAB — AMMONIA: Ammonia: <9 umol/L — ABNORMAL LOW (ref 9–35)

## 2021-05-22 MED ORDER — BISACODYL 10 MG RE SUPP
10.0000 mg | Freq: Once | RECTAL | Status: AC
  Administered 2021-05-22: 10 mg via RECTAL
  Filled 2021-05-22: qty 1

## 2021-05-22 MED ORDER — SODIUM CHLORIDE 0.9 % IV SOLN: INTRAVENOUS | Status: DC
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
## 2021-05-22 NOTE — Plan of Care (Signed)
Pt is alert but has been verbally aggressive. Pt refused medications. Pt cussed at RN when attempted to assess and administer medications pt state " I don't give a damn who you are get the hell out". Attempted fix pts primofit as it was out of place pt refused. When pt rested RN was able to continue to assess. Pt allowed NA to provide perineal care and reapply new primofit, pt also allowed her to completed VS and CBG checks. Pt resting other wise, no distress noted.   Problem: Safety: Goal: Non-violent Restraint(s) Outcome: Progressing   Problem: Safety: Goal: Violent Restraint(s) Outcome: Progressing   Problem: Education: Goal: Knowledge of disease or condition will improve Outcome: Progressing Goal: Knowledge of secondary prevention will improve Outcome: Progressing Goal: Knowledge of patient specific risk factors addressed and post discharge goals established will improve Outcome: Progressing Goal: Individualized Educational Video(s) Outcome: Progressing   Problem: Coping: Goal: Will verbalize positive feelings about self Outcome: Progressing Goal: Will identify appropriate support needs Outcome: Progressing   Problem: Health Behavior/Discharge Planning: Goal: Ability to manage health-related needs will improve Outcome: Progressing   Problem: Ischemic Stroke/TIA Tissue Perfusion: Goal: Complications of ischemic stroke/TIA will be minimized Outcome: Progressing
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
## 2021-05-22 NOTE — NC FL2 (Signed)
Combine LEVEL OF CARE SCREENING TOOL     IDENTIFICATION  Patient Name: Victor Fox Birthdate: 07-31-1946 Sex: male Admission Date (Current Location): 05/12/2021  [facility] and [place] Number:  Engineering geologist and Address:  [facility] [place], [place] [address], [place], [place] [address]      Provider Number: [id number]  Attending Physician Name and Address:  Elmarie Shiley, MD  Relative Name and Phone Number:       Current Level of Care: Hospital Recommended Level of Care: [place] Prior Approval Number:    Date Approved/Denied:   PASRR Number:    Discharge Plan: SNF    Current Diagnoses: Patient Active Problem List   Diagnosis Date Noted   Acute stroke due to occlusion of left posterior cerebral artery ([place]) 05/13/2021   Mixed diabetic hyperlipidemia associated with type 2 diabetes mellitus ([place]) 05/13/2021   Cognitive impairment    Chronic diastolic CHF (congestive heart failure) (HCC)    Lab test positive for detection of COVID-19 virus 04/24/2021   Self-care deficit 04/19/2021   Near syncope 83/15/1761   Acute metabolic encephalopathy 60/73/7106   Hypertensive urgency 10/19/2019   Acute encephalopathy 10/19/2019   Pain due to onychomycosis of toenails of both feet 02/21/2019   Adenomatous polyp 11/05/2018   Blood pressure elevated 11/05/2018   Neuropathy 11/05/2018   Pneumococcal vaccination given 11/05/2018   Pulmonary nodules 10/08/2018   Coronary artery disease 10/08/2018   CAP (community acquired pneumonia) 09/01/2018   Overweight (BMI 25.0-29.9) 03/10/2018   Ataxia, 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   Benign neoplasm 01/19/2018   Onychomycosis due to dermatophyte 01/19/2018   Mononeuritis 01/19/2018   Neck pain 01/19/2018   Vitamin D deficiency 01/19/2018   Aortic valve stenosis 01/05/2018   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 01/05/2018   Dizziness 12/17/2017   Type  2 diabetes mellitus with diabetic polyneuropathy, with long-term current use of insulin ([place]) 12/17/2017   Hyperlipidemia 04/14/2016   Pain and swelling of left wrist 07/10/2015   Carotid artery disease ([place])    Heart murmur 10/03/2013   Essential hypertension 10/03/2013   Acute CVA (cerebrovascular accident) ([place]) 10/03/2013   Carotid artery stenosis 10/03/2013    Orientation RESPIRATION BLADDER Height & Weight     Self  Normal Incontinent Weight: 179 lb 0.2 oz (81.2 kg) Height:     BEHAVIORAL SYMPTOMS/MOOD NEUROLOGICAL BOWEL NUTRITION STATUS      Incontinent Diet (see DC summary)  AMBULATORY STATUS COMMUNICATION OF NEEDS Skin   Extensive Assist Verbally Skin abrasions                       Personal Care Assistance Level of Assistance  Bathing, Feeding, Dressing Bathing Assistance: Maximum assistance Feeding assistance: Limited assistance Dressing Assistance: Maximum assistance     Functional Limitations Info  Hearing   Hearing Info: Impaired (hard of hearing)      SPECIAL CARE FACTORS FREQUENCY  PT (By licensed PT), OT (By licensed OT)     PT Frequency: 5x/wk OT Frequency: 5x/wk            Contractures Contractures Info: Not present    Additional Factors Info  Code Status, Allergies, Psychotropic, Insulin Sliding Scale Code Status Info: DNR Allergies Info: NKA Psychotropic Info: Seroquel 50mg  2x/day Insulin Sliding Scale Info: see DC summary       Current Medications (05/22/2021):  This is the current hospital active medication list Current Facility-Administered  Medications  Medication Dose Route Frequency Provider Last Rate Last Admin    stroke: mapping our early stages of recovery book   Does not apply Once Shalhoub, Sherryll Burger, MD       acetaminophen (TYLENOL) tablet 650 mg  650 mg Oral Q6H PRN Vernelle Emerald, MD   650 mg at 05/14/21 2121   Or   acetaminophen (TYLENOL) suppository 650 mg  650 mg Rectal Q6H PRN Vernelle Emerald, MD        amLODipine (NORVASC) tablet 10 mg  10 mg Oral Daily Shalhoub, Sherryll Burger, MD   10 mg at 05/22/21 1030   aspirin EC tablet 81 mg  81 mg Oral Daily Vernelle Emerald, MD   81 mg at 05/22/21 1030   atorvastatin (LIPITOR) tablet 80 mg  80 mg Oral QHS Thurnell Lose, MD   80 mg at 05/20/21 2149   clopidogrel (PLAVIX) tablet 75 mg  75 mg Oral Daily Dennison Mascot, PA-C   75 mg at 05/22/21 1030   docusate sodium (COLACE) capsule 100 mg  100 mg Oral BID Thurnell Lose, MD   100 mg at 05/22/21 1030   enalapril (VASOTEC) tablet 10 mg  10 mg Oral Daily Shalhoub, Sherryll Burger, MD   10 mg at 05/22/21 1036   enoxaparin (LOVENOX) injection 40 mg  40 mg Subcutaneous Q24H Shalhoub, Sherryll Burger, MD   40 mg at 05/22/21 1029   haloperidol lactate (HALDOL) injection 2 mg  2 mg Intravenous Q6H PRN Thurnell Lose, MD       Or   haloperidol lactate (HALDOL) injection 2 mg  2 mg Intramuscular Q6H PRN Thurnell Lose, MD       hydrALAZINE (APRESOLINE) injection 10 mg  10 mg Intravenous Q6H PRN Thurnell Lose, MD   10 mg at 05/17/21 1730   insulin aspart (novoLOG) injection 0-15 Units  0-15 Units Subcutaneous TID AC & HS Shalhoub, Sherryll Burger, MD   3 Units at 05/21/21 1352   insulin aspart protamine- aspart (NOVOLOG MIX 70/30) injection 10 Units  10 Units Subcutaneous BID AC Thurnell Lose, MD   10 Units at 05/22/21 1034   lactulose (CHRONULAC) 10 GM/15ML solution 30 g  30 g Oral TID Thurnell Lose, MD   30 g at 05/22/21 1030   meclizine (ANTIVERT) tablet 25 mg  25 mg Oral TID Thurnell Lose, MD   25 mg at 05/22/21 1029   ondansetron (ZOFRAN) injection 4 mg  4 mg Intravenous Q6H PRN Shalhoub, Sherryll Burger, MD   4 mg at 05/20/21 1545   polyethylene glycol (MIRALAX / GLYCOLAX) packet 17 g  17 g Oral BID Thurnell Lose, MD   17 g at 05/22/21 1030   QUEtiapine (SEROQUEL) tablet 50 mg  50 mg Oral BID Briant Cedar, MD   50 mg at 05/22/21 1030     Discharge Medications: Please see discharge summary for a  list of discharge medications.  Relevant Imaging Results:  Relevant Lab Results:   Additional Information SS#: [id number]  Geralynn Ochs, LCSW

## 2021-05-22 NOTE — Plan of Care (Signed)
  Problem: Safety: Goal: Non-violent Restraint(s) Outcome: Progressing   Problem: Safety: Goal: Violent Restraint(s) Outcome: Progressing   Problem: Education: Goal: Knowledge of disease or condition will improve Outcome: Progressing Goal: Knowledge of secondary prevention will improve Outcome: Progressing Goal: Knowledge of patient specific risk factors addressed and post discharge goals established will improve Outcome: Progressing Goal: Individualized Educational Video(s) Outcome: Progressing   Problem: Coping: Goal: Will verbalize positive feelings about self Outcome: Progressing Goal: Will identify appropriate support needs Outcome: Progressing   Problem: Health Behavior/Discharge Planning: Goal: Ability to manage health-related needs will improve Outcome: Progressing   Problem: Ischemic Stroke/TIA Tissue Perfusion: Goal: Complications of ischemic stroke/TIA will be minimized Outcome: Progressing

## 2021-05-23 DIAGNOSIS — I63532 Cerebral infarction due to unspecified occlusion or stenosis of left posterior cerebral artery: Secondary | ICD-10-CM | POA: Diagnosis not present

## 2021-05-23 LAB — GLUCOSE, CAPILLARY
Glucose-Capillary: 178 mg/dL — ABNORMAL HIGH (ref 70–99)
Glucose-Capillary: 93 mg/dL (ref 70–99)
Glucose-Capillary: 167 mg/dL — ABNORMAL HIGH (ref 70–99)

## 2021-05-23 LAB — CBC
HCT: 40 % (ref 39.0–52.0)
Hemoglobin: 13.4 g/dL (ref 13.0–17.0)
MCH: 28.5 pg (ref 26.0–34.0)
MCHC: 33.5 g/dL (ref 30.0–36.0)
MCV: 84.9 fL (ref 80.0–100.0)
Platelets: 239 10*3/uL (ref 150–400)
RBC: 4.71 MIL/uL (ref 4.22–5.81)
RDW: 13.2 % (ref 11.5–15.5)
WBC: 15.7 10*3/uL — ABNORMAL HIGH (ref 4.0–10.5)
nRBC: 0 % (ref 0.0–0.2)

## 2021-05-23 LAB — BASIC METABOLIC PANEL
Anion gap: 10 (ref 5–15)
BUN: 12 mg/dL (ref 8–23)
CO2: 27 mmol/L (ref 22–32)
Calcium: 8.6 mg/dL — ABNORMAL LOW (ref 8.9–10.3)
Chloride: 100 mmol/L (ref 98–111)
Creatinine, Ser: 0.77 mg/dL (ref 0.61–1.24)
GFR, Estimated: >60 mL/min (ref >60)
Glucose, Bld: 164 mg/dL — ABNORMAL HIGH (ref 70–99)
Potassium: 3.6 mmol/L (ref 3.5–5.1)
Sodium: 137 mmol/L (ref 135–145)

## 2021-05-23 LAB — RESP PANEL BY RT-PCR (FLU A&B, COVID) ARPGX2
Influenza A by PCR: NEGATIVE
Influenza B by PCR: NEGATIVE
SARS Coronavirus 2 by RT PCR: NEGATIVE

## 2021-05-23 MED ORDER — DOCUSATE SODIUM 100 MG PO CAPS: 100.0000 mg | ORAL_CAPSULE | Freq: Two times a day (BID) | ORAL | 0 refills | Status: AC

## 2021-05-23 MED ORDER — QUETIAPINE FUMARATE 50 MG PO TABS: 50.0000 mg | ORAL_TABLET | Freq: Two times a day (BID) | ORAL | 0 refills | Status: AC

## 2021-05-23 MED ORDER — CLOPIDOGREL BISULFATE 75 MG PO TABS: 75.0000 mg | ORAL_TABLET | Freq: Every day | ORAL | 0 refills | Status: AC

## 2021-05-23 MED ORDER — INSULIN ASPART PROT & ASPART (70-30 MIX) 100 UNIT/ML ~~LOC~~ SUSP: 10.0000 [IU] | Freq: Two times a day (BID) | SUBCUTANEOUS | 11 refills | Status: AC

## 2021-05-23 MED ORDER — ATORVASTATIN CALCIUM 80 MG PO TABS: 80.0000 mg | ORAL_TABLET | Freq: Every day | ORAL | 2 refills | Status: AC

## 2021-05-23 MED ORDER — POLYETHYLENE GLYCOL 3350 17 G PO PACK: 17.0000 g | PACK | Freq: Two times a day (BID) | ORAL | 0 refills | Status: AC

## 2021-05-23 NOTE — Plan of Care (Signed)
Pt is alert x 1. Pt irritable. Pt has had several episodes of diarrhea. Denies pain. Primofit in place.    Problem: Safety: Goal: Non-violent Restraint(s) Outcome: Progressing   Problem: Safety: Goal: Violent Restraint(s) Outcome: Progressing   Problem: Education: Goal: Knowledge of disease or condition will improve Outcome: Progressing Goal: Knowledge of secondary prevention will improve Outcome: Progressing Goal: Knowledge of patient specific risk factors addressed and post discharge goals established will improve Outcome: Progressing Goal: Individualized Educational Video(s) Outcome: Progressing   Problem: Coping: Goal: Will verbalize positive feelings about self Outcome: Progressing Goal: Will identify appropriate support needs Outcome: Progressing   Problem: Health Behavior/Discharge Planning: Goal: Ability to manage health-related needs will improve Outcome: Progressing   Problem: Ischemic Stroke/TIA Tissue Perfusion: Goal: Complications of ischemic stroke/TIA will be minimized Outcome: Progressing

## 2021-05-23 NOTE — TOC Transition Note (Addendum)
Transition of Care [facility]) - CM/SW Discharge Note   Patient Details  Name: VEDANT SHEHADEH MRN: [id number] Date of Birth: May 23, 1946  Transition of Care [facility]) CM/SW Contact:  Pollie Friar, RN Phone Number: 05/23/2021, 1:03 PM   Clinical Narrative:    Patient is discharging back to [facility] today. Pt received auth from [facility]. Pt transporting via [facility]. Bedside RN updated and d/c packet is at the desk.  CM has left 2 voicemails with [place] [facility] about him returning to SNF.   Number for report: [phone]   Final next level of care: Skilled Nursing Facility Barriers to Discharge: No Barriers Identified   Patient Goals and CMS Choice Patient states their goals for this hospitalization and ongoing recovery are:: patient unable to participate in goal setting at this time, not fully oriented      Discharge Placement              Patient chooses bed at:  [facility]) Patient to be transferred to facility by: [place] Name of family member notified: Message left for APS and Spoke to [facility] Patient and family notified of of transfer: 05/23/21  Discharge Plan and Services     Post Acute Care Choice: [place]                               Social Determinants of Health (SDOH) Interventions     Readmission Risk Interventions No flowsheet data found.

## 2021-05-23 NOTE — Discharge Summary (Addendum)
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 Sensitive     GENTAMICIN <=1 SENSITIVE Sensitive     IMIPENEM <=0.25 SENSITIVE Sensitive     NITROFURANTOIN <=16 SENSITIVE Sensitive     TRIMETH/SULFA <=20 SENSITIVE Sensitive     AMPICILLIN/SULBACTAM <=2 SENSITIVE Sensitive     PIP/TAZO <=4 SENSITIVE Sensitive     * >=100,000 COLONIES/mL ESCHERICHIA COLI   Klebsiella pneumoniae - MIC*    AMPICILLIN >=32 RESISTANT Resistant     CEFAZOLIN <=4 SENSITIVE Sensitive     CEFEPIME <=0.12 SENSITIVE Sensitive     CEFTRIAXONE <=0.25 SENSITIVE Sensitive     CIPROFLOXACIN <=0.25 SENSITIVE Sensitive     GENTAMICIN <=1 SENSITIVE Sensitive     IMIPENEM <=0.25 SENSITIVE Sensitive     NITROFURANTOIN 64 INTERMEDIATE Intermediate     TRIMETH/SULFA <=20 SENSITIVE Sensitive     AMPICILLIN/SULBACTAM 4 SENSITIVE Sensitive     PIP/TAZO <=4 SENSITIVE Sensitive     * 50,000 COLONIES/mL KLEBSIELLA PNEUMONIAE  Resp Panel by RT-PCR (Flu A&B, Covid) Nasopharyngeal Swab     Status: None   Collection Time: 05/23/21 10:29 AM   Specimen:  Nasopharyngeal Swab; Nasopharyngeal(NP) swabs in vial transport medium  Result Value Ref Range Status   SARS Coronavirus 2 by RT PCR NEGATIVE NEGATIVE Final    Comment: (NOTE) SARS-CoV-2 target nucleic acids are NOT DETECTED.  The SARS-CoV-2 RNA is generally detectable in upper respiratory specimens during the acute phase of infection. The lowest concentration of SARS-CoV-2 viral copies this assay can detect is 138 copies/mL. A negative result does not preclude SARS-Cov-2 infection and should not be used as the sole basis for treatment or other patient management decisions. A negative result may occur with  improper specimen collection/handling, submission of specimen other than nasopharyngeal swab, presence of viral mutation(s) within the areas targeted by this assay, and inadequate number of viral copies(<138 copies/mL). A negative result must be combined with clinical observations, patient history, and epidemiological information. The  expected result is Negative.  Fact Sheet for Patients:  EntrepreneurPulse.com.au  Fact Sheet for Healthcare Providers:  IncredibleEmployment.be  This test is no t yet approved or cleared by the [place] FDA and  has been authorized for detection and/or diagnosis of SARS-CoV-2 by FDA under an Emergency Use Authorization (EUA). This EUA will remain  in effect (meaning this test can be used) for the duration of the COVID-19 declaration under Section 564(b)(1) of the Act, 21 U.S.C.section 360bbb-3(b)(1), unless the authorization is terminated  or revoked sooner.       Influenza A by PCR NEGATIVE NEGATIVE Final   Influenza B by PCR NEGATIVE NEGATIVE Final    Comment: (NOTE) The Xpert Xpress SARS-CoV-2/FLU/RSV plus assay is intended as an aid in the diagnosis of influenza from Nasopharyngeal swab specimens and should not be used as a sole basis for treatment. Nasal washings and aspirates are unacceptable for  Xpert Xpress SARS-CoV-2/FLU/RSV testing.  Fact Sheet for Patients: EntrepreneurPulse.com.au  Fact Sheet for Healthcare Providers: IncredibleEmployment.be  This test is not yet approved or cleared by the [place] FDA and has been authorized for detection and/or diagnosis of SARS-CoV-2 by FDA under an Emergency Use Authorization (EUA). This EUA will remain in effect (meaning this test can be used) for the duration of the COVID-19 declaration under Section 564(b)(1) of the Act, 21 U.S.C. section 360bbb-3(b)(1), unless the authorization is terminated or revoked.  Performed at [facility], [place] [address], [place], [place] [address]      Labs: BNP (last 3 results) Recent Labs    05/15/21 0330 05/16/21 0409 05/17/21 0321  BNP 206.6* 56.8 563.8*   Basic Metabolic Panel: Recent Labs  Lab 05/17/21 0321 05/19/21 0230 05/22/21 1058 05/23/21 0318  NA 141 138 139 137  K 3.5 3.7 3.9 3.6  CL 104 101 97* 100  CO2 _0 GLUCOSE 103* 110* 120* 164*  BUN _1 CREATININE 0.69 0.72 0.90 0.77  CALCIUM 9.1 8.8* 9.5 8.6*  MG 2.2 2.0 2.3  --    Liver Function Tests: Recent Labs  Lab 05/17/21 0321  AST 18  ALT 16  ALKPHOS 76  BILITOT 1.2  PROT 6.0*  ALBUMIN 2.8*   No results for input(s): LIPASE, AMYLASE in the last 168 hours. Recent Labs  Lab 05/22/21 1058  AMMONIA <9*   CBC: Recent Labs  Lab 05/17/21 0321 05/19/21 0230 05/22/21 1058 05/23/21 0318  WBC 15.6* 8.2 17.7* 15.7*  NEUTROABS 12.8*  --   --   --   HGB 13.8 13.2 15.6 13.4  HCT 40.7 39.1 46.0 40.0  MCV 85.7 85.0 85.7 84.9  PLT 176 199 278 239   Cardiac Enzymes: No results for input(s): CKTOTAL, CKMB, CKMBINDEX, TROPONINI in the last 168 hours. BNP: Invalid input(s): POCBNP CBG: Recent Labs  Lab 05/22/21 1155 05/22/21 1743 05/22/21 2207 05/23/21 0615 05/23/21 1204  GLUCAP 126* 197* 153* 178* 93   D-Dimer No results for input(s):  DDIMER in the last 72 hours. Hgb A1c No results for input(s): HGBA1C in the last 72 hours. Lipid Profile No results for input(s): CHOL, HDL, LDLCALC, TRIG, CHOLHDL, LDLDIRECT in the last 72 hours. Thyroid function studies No results for input(s): TSH, T4TOTAL, T3FREE, THYROIDAB in the last 72 hours.  Invalid input(s): FREET3 Anemia work up Recent Labs    05/22/21 1058  VITAMINB12 590   Urinalysis    Component Value Date/Time   COLORURINE YELLOW 05/15/2021 [place]  CLEAR 05/15/2021 1100   APPEARANCEUR Clear 05/02/2013 1443   LABSPEC >1.030 (H) 05/15/2021 1100   LABSPEC 1.017 05/02/2013 1443   PHURINE 6.0 05/15/2021 1100   GLUCOSEU NEGATIVE 05/15/2021 1100   GLUCOSEU Negative 05/02/2013 1443   HGBUR LARGE (A) 05/15/2021 1100   BILIRUBINUR SMALL (A) 05/15/2021 1100   BILIRUBINUR Negative 05/02/2013 1443   KETONESUR 40 (A) 05/15/2021 1100   PROTEINUR NEGATIVE 05/15/2021 1100   NITRITE POSITIVE (A) 05/15/2021 1100   LEUKOCYTESUR NEGATIVE 05/15/2021 1100   LEUKOCYTESUR Negative 05/02/2013 1443   Sepsis Labs Invalid input(s): PROCALCITONIN,  WBC,  LACTICIDVEN Microbiology Recent Results (from the past 240 hour(s))  Urine Culture     Status: Abnormal   Collection Time: 05/15/21  7:00 AM   Specimen: Urine, Clean Catch  Result Value Ref Range Status   Specimen Description URINE, CLEAN CATCH  Final   Special Requests   Final    NONE Performed at [facility], [place] [address], [place], [place] [address]    Culture (A)  Final    >=100,000 COLONIES/mL ESCHERICHIA COLI 50,000 COLONIES/mL KLEBSIELLA PNEUMONIAE    Report Status 05/17/2021 FINAL  Final   Organism ID, Bacteria ESCHERICHIA COLI (A)  Final   Organism ID, Bacteria KLEBSIELLA PNEUMONIAE (A)  Final      Susceptibility   Escherichia coli - MIC*    AMPICILLIN 4 SENSITIVE Sensitive     CEFAZOLIN <=4 SENSITIVE Sensitive     CEFEPIME <=0.12 SENSITIVE Sensitive     CEFTRIAXONE <=0.25 SENSITIVE Sensitive      CIPROFLOXACIN <=0.25 SENSITIVE Sensitive     GENTAMICIN <=1 SENSITIVE Sensitive     IMIPENEM <=0.25 SENSITIVE Sensitive     NITROFURANTOIN <=16 SENSITIVE Sensitive     TRIMETH/SULFA <=20 SENSITIVE Sensitive     AMPICILLIN/SULBACTAM <=2 SENSITIVE Sensitive     PIP/TAZO <=4 SENSITIVE Sensitive     * >=100,000 COLONIES/mL ESCHERICHIA COLI   Klebsiella pneumoniae - MIC*    AMPICILLIN >=32 RESISTANT Resistant     CEFAZOLIN <=4 SENSITIVE Sensitive     CEFEPIME <=0.12 SENSITIVE Sensitive     CEFTRIAXONE <=0.25 SENSITIVE Sensitive     CIPROFLOXACIN <=0.25 SENSITIVE Sensitive     GENTAMICIN <=1 SENSITIVE Sensitive     IMIPENEM <=0.25 SENSITIVE Sensitive     NITROFURANTOIN 64 INTERMEDIATE Intermediate     TRIMETH/SULFA <=20 SENSITIVE Sensitive     AMPICILLIN/SULBACTAM 4 SENSITIVE Sensitive     PIP/TAZO <=4 SENSITIVE Sensitive     * 50,000 COLONIES/mL KLEBSIELLA PNEUMONIAE  Resp Panel by RT-PCR (Flu A&B, Covid) Nasopharyngeal Swab     Status: None   Collection Time: 05/23/21 10:29 AM   Specimen: Nasopharyngeal Swab; Nasopharyngeal(NP) swabs in vial transport medium  Result Value Ref Range Status   SARS Coronavirus 2 by RT PCR NEGATIVE NEGATIVE Final    Comment: (NOTE) SARS-CoV-2 target nucleic acids are NOT DETECTED.  The SARS-CoV-2 RNA is generally detectable in upper respiratory specimens during the acute phase of infection. The lowest concentration of SARS-CoV-2 viral copies this assay can detect is 138 copies/mL. A negative result does not preclude SARS-Cov-2 infection and should not be used as the sole basis for treatment or other patient management decisions. A negative result may occur with  improper specimen collection/handling, submission of specimen other than nasopharyngeal swab, presence of viral mutation(s) within the areas targeted by this assay, and inadequate number of viral copies(<138 copies/mL). A negative result must be combined with clinical observations, patient  history, and epidemiological information. The  expected result is Negative.  Fact Sheet for Patients:  EntrepreneurPulse.com.au  Fact Sheet for Healthcare Providers:  IncredibleEmployment.be  This test is no t yet approved or cleared by the [place] FDA and  has been authorized for detection and/or diagnosis of SARS-CoV-2 by FDA under an Emergency Use Authorization (EUA). This EUA will remain  in effect (meaning this test can be used) for the duration of the COVID-19 declaration under Section 564(b)(1) of the Act, 21 U.S.C.section 360bbb-3(b)(1), unless the authorization is terminated  or revoked sooner.       Influenza A by PCR NEGATIVE NEGATIVE Final   Influenza B by PCR NEGATIVE NEGATIVE Final    Comment: (NOTE) The Xpert Xpress SARS-CoV-2/FLU/RSV plus assay is intended as an aid in the diagnosis of influenza from Nasopharyngeal swab specimens and should not be used as a sole basis for treatment. Nasal washings and aspirates are unacceptable for Xpert Xpress SARS-CoV-2/FLU/RSV testing.  Fact Sheet for Patients: EntrepreneurPulse.com.au  Fact Sheet for Healthcare Providers: IncredibleEmployment.be  This test is not yet approved or cleared by the [place] FDA and has been authorized for detection and/or diagnosis of SARS-CoV-2 by FDA under an Emergency Use Authorization (EUA). This EUA will remain in effect (meaning this test can be used) for the duration of the COVID-19 declaration under Section 564(b)(1) of the Act, 21 U.S.C. section 360bbb-3(b)(1), unless the authorization is terminated or revoked.  Performed at [facility], [place] [address], [place], [place] [address]      Time coordinating discharge: 40 minutes  SIGNED:   Elmarie Shiley, MD  [facility]

## 2021-05-23 NOTE — Progress Notes (Signed)
Attempted to call report to [facility], no answer.

## 2021-05-23 NOTE — TOC Progression Note (Signed)
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    Expected Discharge Plan: [place] Barriers to Discharge: No Barriers Identified  Expected Discharge Plan and Services Expected Discharge Plan: [place] Choice: [place] arrangements for the past 2 months: Single Family Home Expected Discharge Date: 05/23/21                                     Social Determinants of Health (SDOH) Interventions    Readmission Risk Interventions No flowsheet data found.

## 2021-05-24 LAB — VITAMIN B1: Vitamin B1 (Thiamine): 149.7 nmol/L (ref 66.5–200.0)

## 2021-07-02 DEATH — deceased

## 2021-08-15 ENCOUNTER — Inpatient Hospital Stay: Payer: Medicare Other | Admitting: Neurology
